# Patient Record
Sex: Female | Born: 1945 | Race: White | Hispanic: No | Marital: Married | Smoking: Former smoker
Health system: Southern US, Community
[De-identification: ages and names within clinical notes are randomized; demographics above are authoritative.]

## PROBLEM LIST (undated history)

## (undated) DIAGNOSIS — Z720 Tobacco use: Secondary | ICD-10-CM

## (undated) DIAGNOSIS — I10 Essential (primary) hypertension: Secondary | ICD-10-CM

## (undated) DIAGNOSIS — I480 Paroxysmal atrial fibrillation: Secondary | ICD-10-CM

## (undated) DIAGNOSIS — I872 Venous insufficiency (chronic) (peripheral): Secondary | ICD-10-CM

## (undated) DIAGNOSIS — I959 Hypotension, unspecified: Secondary | ICD-10-CM

## (undated) DIAGNOSIS — J45909 Unspecified asthma, uncomplicated: Secondary | ICD-10-CM

## (undated) DIAGNOSIS — J449 Chronic obstructive pulmonary disease, unspecified: Secondary | ICD-10-CM

## (undated) HISTORY — DX: Unspecified asthma, uncomplicated: J45.909

## (undated) HISTORY — PX: NECK SURGERY: SHX720

## (undated) HISTORY — DX: Tobacco use: Z72.0

## (undated) HISTORY — DX: Hypotension, unspecified: I95.9

## (undated) HISTORY — PX: VESICOVAGINAL FISTULA CLOSURE W/ TAH: SUR271

## (undated) HISTORY — DX: Paroxysmal atrial fibrillation: I48.0

## (undated) HISTORY — DX: Venous insufficiency (chronic) (peripheral): I87.2

## (undated) HISTORY — PX: OTHER SURGICAL HISTORY: SHX169

## (undated) HISTORY — DX: Chronic obstructive pulmonary disease, unspecified: J44.9

---

## 1998-07-21 ENCOUNTER — Ambulatory Visit (HOSPITAL_COMMUNITY): Admission: RE | Admit: 1998-07-21 | Discharge: 1998-07-21 | Payer: Self-pay | Admitting: Cardiology

## 1998-08-06 ENCOUNTER — Encounter: Payer: Self-pay | Admitting: Cardiology

## 1998-08-06 ENCOUNTER — Ambulatory Visit (HOSPITAL_COMMUNITY): Admission: RE | Admit: 1998-08-06 | Discharge: 1998-08-06 | Payer: Self-pay | Admitting: Cardiology

## 1998-08-21 ENCOUNTER — Ambulatory Visit (HOSPITAL_COMMUNITY): Admission: RE | Admit: 1998-08-21 | Discharge: 1998-08-21 | Payer: Self-pay | Admitting: Cardiology

## 2005-01-06 ENCOUNTER — Ambulatory Visit: Payer: Self-pay | Admitting: Internal Medicine

## 2005-05-04 ENCOUNTER — Ambulatory Visit: Payer: Self-pay | Admitting: Internal Medicine

## 2005-07-15 ENCOUNTER — Emergency Department (HOSPITAL_COMMUNITY): Admission: EM | Admit: 2005-07-15 | Discharge: 2005-07-15 | Payer: Self-pay | Admitting: Emergency Medicine

## 2005-07-23 ENCOUNTER — Ambulatory Visit: Payer: Self-pay | Admitting: Internal Medicine

## 2006-02-23 ENCOUNTER — Ambulatory Visit: Payer: Self-pay | Admitting: Internal Medicine

## 2006-03-23 ENCOUNTER — Ambulatory Visit: Payer: Self-pay | Admitting: Internal Medicine

## 2006-06-13 ENCOUNTER — Ambulatory Visit: Payer: Self-pay | Admitting: Internal Medicine

## 2006-12-28 ENCOUNTER — Ambulatory Visit: Payer: Self-pay | Admitting: Internal Medicine

## 2006-12-28 DIAGNOSIS — J449 Chronic obstructive pulmonary disease, unspecified: Secondary | ICD-10-CM

## 2006-12-28 DIAGNOSIS — Z87891 Personal history of nicotine dependence: Secondary | ICD-10-CM

## 2006-12-28 DIAGNOSIS — I872 Venous insufficiency (chronic) (peripheral): Secondary | ICD-10-CM | POA: Insufficient documentation

## 2006-12-28 DIAGNOSIS — J4489 Other specified chronic obstructive pulmonary disease: Secondary | ICD-10-CM | POA: Insufficient documentation

## 2008-05-14 ENCOUNTER — Ambulatory Visit: Payer: Self-pay | Admitting: Pulmonary Disease

## 2008-05-14 DIAGNOSIS — F411 Generalized anxiety disorder: Secondary | ICD-10-CM

## 2008-05-14 DIAGNOSIS — R030 Elevated blood-pressure reading, without diagnosis of hypertension: Secondary | ICD-10-CM

## 2008-06-18 ENCOUNTER — Ambulatory Visit: Payer: Self-pay | Admitting: Internal Medicine

## 2009-03-05 ENCOUNTER — Ambulatory Visit: Payer: Self-pay | Admitting: Pulmonary Disease

## 2009-03-12 ENCOUNTER — Ambulatory Visit: Payer: Self-pay | Admitting: Pulmonary Disease

## 2009-04-08 ENCOUNTER — Telehealth (INDEPENDENT_AMBULATORY_CARE_PROVIDER_SITE_OTHER): Payer: Self-pay | Admitting: *Deleted

## 2009-05-21 ENCOUNTER — Ambulatory Visit: Payer: Self-pay | Admitting: Pulmonary Disease

## 2009-06-03 ENCOUNTER — Telehealth: Payer: Self-pay | Admitting: Pulmonary Disease

## 2010-02-06 ENCOUNTER — Encounter: Payer: Self-pay | Admitting: Internal Medicine

## 2010-02-10 ENCOUNTER — Ambulatory Visit: Payer: Self-pay | Admitting: Internal Medicine

## 2010-02-10 DIAGNOSIS — K7689 Other specified diseases of liver: Secondary | ICD-10-CM

## 2010-02-10 DIAGNOSIS — I700 Atherosclerosis of aorta: Secondary | ICD-10-CM

## 2010-02-17 LAB — CONVERTED CEMR LAB
ALT: 22 units/L (ref 0–35)
AST: 20 units/L (ref 0–37)
Albumin: 4.1 g/dL (ref 3.5–5.2)
Alkaline Phosphatase: 80 units/L (ref 39–117)
BUN: 11 mg/dL (ref 6–23)
Bilirubin, Direct: 0.1 mg/dL (ref 0.0–0.3)
CO2: 28 meq/L (ref 19–32)
Calcium: 9.6 mg/dL (ref 8.4–10.5)
Chloride: 103 meq/L (ref 96–112)
Cholesterol: 203 mg/dL — ABNORMAL HIGH (ref 0–200)
Creatinine, Ser: 0.7 mg/dL (ref 0.4–1.2)
Direct LDL: 136 mg/dL
GFR calc non Af Amer: 86.44 mL/min (ref >60)
Glucose, Bld: 90 mg/dL (ref 70–99)
HDL: 55.1 mg/dL (ref >39.00)
Potassium: 4.1 meq/L (ref 3.5–5.1)
Sodium: 140 meq/L (ref 135–145)
Total Bilirubin: 0.4 mg/dL (ref 0.3–1.2)
Total CHOL/HDL Ratio: 4
Total Protein: 6.9 g/dL (ref 6.0–8.3)
Triglycerides: 138 mg/dL (ref 0.0–149.0)
VLDL: 27.6 mg/dL (ref 0.0–40.0)

## 2010-06-24 ENCOUNTER — Other Ambulatory Visit: Payer: Self-pay | Admitting: Internal Medicine

## 2010-06-24 ENCOUNTER — Ambulatory Visit
Admission: RE | Admit: 2010-06-24 | Discharge: 2010-06-24 | Payer: Self-pay | Source: Home / Self Care | Attending: Internal Medicine | Admitting: Internal Medicine

## 2010-06-24 ENCOUNTER — Encounter: Payer: Self-pay | Admitting: Internal Medicine

## 2010-06-24 ENCOUNTER — Encounter (INDEPENDENT_AMBULATORY_CARE_PROVIDER_SITE_OTHER): Payer: Self-pay | Admitting: *Deleted

## 2010-06-24 DIAGNOSIS — R143 Flatulence: Secondary | ICD-10-CM

## 2010-06-24 DIAGNOSIS — K5900 Constipation, unspecified: Secondary | ICD-10-CM | POA: Insufficient documentation

## 2010-06-24 DIAGNOSIS — R141 Gas pain: Secondary | ICD-10-CM | POA: Insufficient documentation

## 2010-06-24 DIAGNOSIS — R142 Eructation: Secondary | ICD-10-CM

## 2010-06-24 DIAGNOSIS — R109 Unspecified abdominal pain: Secondary | ICD-10-CM | POA: Insufficient documentation

## 2010-06-24 LAB — CBC WITH DIFFERENTIAL/PLATELET
Basophils Absolute: 0 10*3/uL (ref 0.0–0.1)
Basophils Relative: 0.4 % (ref 0.0–3.0)
Eosinophils Absolute: 0.5 10*3/uL (ref 0.0–0.7)
Eosinophils Relative: 4.7 % (ref 0.0–5.0)
HCT: 43.4 % (ref 36.0–46.0)
Hemoglobin: 14.6 g/dL (ref 12.0–15.0)
Lymphocytes Relative: 28.9 % (ref 12.0–46.0)
Lymphs Abs: 2.9 10*3/uL (ref 0.7–4.0)
MCHC: 33.7 g/dL (ref 30.0–36.0)
MCV: 90.9 fl (ref 78.0–100.0)
Monocytes Absolute: 0.7 10*3/uL (ref 0.1–1.0)
Monocytes Relative: 7.3 % (ref 3.0–12.0)
Neutro Abs: 6 10*3/uL (ref 1.4–7.7)
Neutrophils Relative %: 58.7 % (ref 43.0–77.0)
Platelets: 252 10*3/uL (ref 150.0–400.0)
RBC: 4.78 Mil/uL (ref 3.87–5.11)
RDW: 14.3 % (ref 11.5–14.6)
WBC: 10.2 10*3/uL (ref 4.5–10.5)

## 2010-06-24 LAB — HEPATIC FUNCTION PANEL
ALT: 16 U/L (ref 0–35)
AST: 20 U/L (ref 0–37)
Albumin: 3.9 g/dL (ref 3.5–5.2)
Alkaline Phosphatase: 90 U/L (ref 39–117)
Bilirubin, Direct: 0.1 mg/dL (ref 0.0–0.3)
Total Bilirubin: 0.8 mg/dL (ref 0.3–1.2)
Total Protein: 6.9 g/dL (ref 6.0–8.3)

## 2010-06-24 LAB — TSH: TSH: 2.39 u[IU]/mL (ref 0.35–5.50)

## 2010-06-29 ENCOUNTER — Ambulatory Visit
Admission: RE | Admit: 2010-06-29 | Discharge: 2010-06-29 | Payer: Self-pay | Source: Home / Self Care | Attending: Internal Medicine | Admitting: Internal Medicine

## 2010-06-29 LAB — CONVERTED CEMR LAB
OCCULT 1: NEGATIVE
OCCULT 2: NEGATIVE
OCCULT 3: NEGATIVE

## 2010-06-30 ENCOUNTER — Encounter (INDEPENDENT_AMBULATORY_CARE_PROVIDER_SITE_OTHER): Payer: Self-pay | Admitting: *Deleted

## 2010-07-14 NOTE — Assessment & Plan Note (Signed)
Summary: FOR RIGHT SIDE PAIN//PH   Vital Signs:  Patient profile:   65 year old female Weight:      135.6 pounds BMI:     21.31 Temp:     98.2 degrees F oral Pulse rate:   72 / minute BP sitting:   158 / 90  (left arm) Cuff size:   regular  Vitals Entered By: Shonna Chock CMA (February 10, 2010 9:23 AM) CC: Patient here following up per Dr.Peterson, patient states she was told she has an enlarged Aorta and should follow-up with her primary Dr   Primary Care Provider:  Marga Melnick MD  CC:  Patient here following up per Dr.Peterson and patient states she was told she has an enlarged Aorta and should follow-up with her primary Dr.  History of Present Illness: Dr Vonita Moss found small renal calculus during  abdominal pain evaluation. An incidental finding was an "enlarged aorta". Records  reviewed : fatty infiltration of liver;3 mm LU renal calculus;tiny ? cyst R kidney & aortic ectasia below renal arteries with atherosclerosis. See BP; no PMH of HTN "except when nervous". The patient denies lightheadedness, urinary frequency, headaches, rash, and fatigue.  Associated symptoms include exercise intolerance and dyspnea.  The patient denies the following associated symptoms: chest pain, chest pressure, palpitations, syncope, leg edema, and pedal edema.  The patient reports that dietary compliance has been fair.  The patient reports no exercise.  Adjunctive measures currently used by the patient include salt restriction.   No PMH of dyslidemia. Smoking "2 cigarettes / day".  Current Medications (verified): 1)  Symbicort 80-4.5 Mcg/act Aero (Budesonide-Formoterol Fumarate) .... Inhale 2 Puffs Two Times A Day, As Needed  Allergies (verified): No Known Drug Allergies  Physical Exam  General:  Weathered facies,in no acute distress; alert,appropriate and cooperative throughout examination Lungs:  Normal respiratory effort, chest expands symmetrically. Lungs are : silent  Heart:  Normal rate and  regular rhythm. S1 and S2 normal without gallop, murmur, click, rub.S4 Abdomen:  Bowel sounds positive,abdomen soft and non-tender without masses, organomegaly or hernias noted. No bruits or AAA Pulses:  R and L carotid,radial,dorsalis pedis and posterior tibial pulses are full and equal bilaterally Extremities:  No  cyanosis, edema, or deformity noted .Slight clubbing Psych:  flat affect and subdued.     Impression & Recommendations:  Problem # 1:  ELEVATED BLOOD PRESSURE WITHOUT DIAGNOSIS OF HYPERTENSION (ICD-796.2)  Orders: Venipuncture (95621) TLB-Lipid Panel (80061-LIPID) TLB-BMP (Basic Metabolic Panel-BMET) (80048-METABOL) Specimen Handling (30865)  Her updated medication list for this problem includes:    Diltiazem Hcl Cr 90 Mg Xr12h-cap (Diltiazem hcl) .Marland Kitchen... 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 Inhale 2 puffs two times a day, as needed 2)  Diltiazem Hcl Cr 90 Mg Xr12h-cap (Diltiazem hcl) .Marland Kitchen.. 1 once daily if bp avrages > 135/85  Other Orders: Flu Vaccine 45yrs + (27253) Administration Flu vaccine - MCR (G6440)  Patient Instructions: 1)  Check your Blood Pressure regularly. If it is above: 135/85 ON AVERAGE  you should  start medication as Rxed. Flu Vaccine Consent Questions     Do you have a history of severe allergic reactions to this vaccine? no  Any prior history of allergic reactions to egg and/or gelatin? no    Do you have a sensitivity to the preservative Thimersol? no    Do you have a past history of Guillan-Barre Syndrome? no    Do you currently have an  acute febrile illness? no    Have you ever had a severe reaction to latex? no    Vaccine information given and explained to patient? yes    Are you currently pregnant? no    Lot Number:[id number]   Exp Date:12/12/2010   Site Given  Right Deltoid IMPrescriptions: DILTIAZEM HCL CR 90 MG XR12H-CAP (DILTIAZEM HCL) 1 once daily if BP AVRAGES > 135/85  #30 x 5   Entered and Authorized by:   Marga Melnick MD   Signed by:   Marga Melnick MD on 02/10/2010   Method used:   Print then Give to Patient   RxID:   [id number]  .lbmedflu

## 2010-07-16 NOTE — Miscellaneous (Signed)
Summary: Orders Update  Clinical Lists Changes  Orders: Added new Service order of Prescription Created Electronically (G8553) - Signed 

## 2010-07-16 NOTE — Assessment & Plan Note (Signed)
Summary: RIGHT SIDE PAIN//PH   Vital Signs:  Patient profile:   65 year old female Height:      67 inches (170.18 cm) Weight:      141.38 pounds (64.26 kg) BMI:     22.22 Temp:     98.7 degrees F (37.06 degrees C) oral Resp:     16 per minute BP sitting:   140 / 62  (left arm) Cuff size:   regular  Vitals Entered By: Lucious Groves CMA (June 24, 2010 12:47 PM) CC: C/O right side pain x 3 days./kb, Abdominal pain Is Patient Diabetic? No Pain Assessment Patient in pain? yes     Location: right side Intensity: 2 Type: gas Onset of pain  3 days Comments Patient notes that she has been having constipation, increased gas and bloating. She denies fever, and N&V.   Primary Care Provider:  Marga Melnick MD  CC:  C/O right side pain x 3 days./kb and Abdominal pain.  History of Present Illness: Abdominal Pain      This is a 65 year old woman who presents with Abdominal pain & bloating  X 3 days.  The patient reports constipation (an intermittent issue. Note: on CCB), but denies nausea, vomiting, diarrhea, melena, hematochezia, anorexia, and hematemesis.  The location of the pain is right lower, lateral  quadrant in MAL above the R hip.  The pain is described as intermittent, sharp/ "gas" like , and radiating to the groin on 1 occasion only.  The patient denies the following symptoms: fever, weight loss, dysuria, chest pain, jaundice, dark urine, and vaginal bleeding or discharge.  The pain is worse with spicey  food.  The pain  has improved  with  Tylenol.  BM this am was scant.She has had similar , but < severe pain over past year when constipated. No colonoscopy; "  I don't run to the doctor if I can figure it out.I'm afraid of of it".SOC for colonoscopy reviewed. No PMH  or FH of GI diseases. PMH of TAH & BSO  , "except they left a 1/4 of 1 ovary". Gyn last seen 6 months.  Current Medications (verified): 1)  Symbicort 80-4.5 Mcg/act Aero (Budesonide-Formoterol Fumarate) .... Inhale 2  Puffs Two Times A Day, As Needed 2)  Diltiazem Hcl Cr 90 Mg Xr12h-Cap (Diltiazem Hcl) .Marland Kitchen.. 1 Once Daily If Bp Avrages > 135/85  Allergies (verified): No Known Drug Allergies  Physical Exam  General:  in no acute distress; alert,appropriate and cooperative throughout examination Eyes:  No corneal or conjunctival inflammation noted.No icterus Mouth:  Oral mucosa and oropharynx without lesions or exudates.  No pharyngeal erythema.   Neck:  No deformities, masses, or tenderness noted. Lungs:  Normal respiratory effort, chest expands symmetrically. Lungs are clear to auscultation, no crackles or wheezes. Decreased BS Heart:  regular rhythm, no murmur, no gallop, no rub, no JVD, no HJR, and bradycardia.   Abdomen:  Bowel sounds positive,abdomen soft and non-tender without masses, organomegaly or hernias noted. Dullness RUQ Skin:  Intact without suspicious lesions or rashes. No jaundice Cervical Nodes:  No lymphadenopathy noted Axillary Nodes:  No palpable lymphadenopathy Psych:  memory intact for recent and remote, normally interactive, and good eye contact.     Impression & Recommendations:  Problem # 1:  ABDOMINAL PAIN (ICD-789.00)  Orders: Gastroenterology Referral (GI)  Problem # 2:  ABDOMINAL BLOATING (ICD-787.3)  ? 1/4 of 1 ovary remains  Orders: Venipuncture (30865) TLB-TSH (Thyroid Stimulating Hormone) (84443-TSH) TLB-CBC Platelet - w/Differential (  85025-CBCD) TLB-Hepatic/Liver Function Pnl (80076-HEPATIC)  Problem # 3:  CONSTIPATION (ICD-564.00)  intermittent  Orders: Venipuncture (11914) TLB-TSH (Thyroid Stimulating Hormone) (78295-AOZ) Gastroenterology Referral (GI)  Complete Medication List: 1)  Symbicort 80-4.5 Mcg/act Aero (Budesonide-formoterol fumarate) .... Inhale 2 puffs two times a day, as needed 2)  Diltiazem Hcl Cr 90 Mg Xr12h-cap (Diltiazem hcl) .Marland Kitchen.. 1 once daily if bp avrages > 135/85 3)  Oscimin 0.125 Mg Subl (Hyoscyamine sulfate) .Marland Kitchen.. 1 under  tongue every 6 hrs as needed for pain  Patient Instructions: 1)  Complete stool cards. Verify with Gyn as to status of ovarian surgery. Miralax every 3rd night as needed for costipation.  If constipation persists , Diltiazem may need to be changed. Prescriptions: OSCIMIN 0.125 MG SUBL (HYOSCYAMINE SULFATE) 1 under tongue every 6 hrs as needed for pain  #30 x 0   Entered and Authorized by:   Marga Melnick MD   Signed by:   Marga Melnick MD on 06/24/2010   Method used:   [id number]       [address]       [place], [place]  [address]       Ph: [phone]       Fax: [phone]   RxID:   [id number]    Orders Added: 1)  Est. Patient Level IV [87564] 2)  Venipuncture [33295] 3)  TLB-TSH (Thyroid Stimulating Hormone) [84443-TSH] 4)  TLB-CBC Platelet - w/Differential [85025-CBCD] 5)  TLB-Hepatic/Liver Function Pnl [80076-HEPATIC] 6)  Gastroenterology Referral [GI]  Appended Document: RIGHT SIDE PAIN//PH

## 2010-07-16 NOTE — Letter (Signed)
Summary: Results Follow up Letter  [place] [facility]  [address] [place], [place] [address]   Phone: [phone]  Fax: [phone]    06/30/2010 MRN: [id number]  MARGIE Rachal [address], [place]  [address]  Dear Ms. Janowiak,  The following are the results of your recent test(s):  Test         Result    Pap Smear:        Normal _____  Not Normal _____ Comments: ______________________________________________________ Cholesterol: LDL(Bad cholesterol):         Your goal is less than:         HDL (Good cholesterol):       Your goal is more than: Comments:  ______________________________________________________ Mammogram:        Normal _____  Not Normal _____ Comments:  ___________________________________________________________________ Hemoccult:        Normal _X____  Not normal _______ Comments:    _____________________________________________________________________ Other Tests:    We routinely do not discuss normal results over the telephone.  If you desire a copy of the results, or you have any questions about this information we can discuss them at your next office visit.   Sincerely,

## 2010-07-16 NOTE — Letter (Signed)
Summary: New Patient letter  [facility]  [address] [place], [place] [address]   Phone: [phone]  Fax: [phone]       06/24/2010 MRN: [id number]  [facility] [place] [address], [place]  [address]  Dear Ms. Abbey,  Welcome to the Gastroenterology Division at [facility].    You are scheduled to see Dr.  Stan Head on July 31, 2010 at 2:00pm on the 3rd floor at [facility], [address]  We ask that you try to arrive at our office 15 minutes prior to your appointment time to allow for check-in.  We would like you to complete the enclosed self-administered evaluation form prior to your visit and bring it with you on the day of your appointment.  We will review it with you.  Also, please bring a complete list of all your medications or, if you prefer, bring the medication bottles and we will list them.  Please bring your insurance card so that we may make a copy of it.  If your insurance requires a referral to see a specialist, please bring your referral form from your primary care physician.  Co-payments are due at the time of your visit and may be paid by cash, check or credit card.     Your office visit will consist of a consult with your physician (includes a physical exam), any laboratory testing he/she may order, scheduling of any necessary diagnostic testing (e.g. x-ray, ultrasound, CT-scan), and scheduling of a procedure (e.g. Endoscopy, Colonoscopy) if required.  Please allow enough time on your schedule to allow for any/all of these possibilities.    If you cannot keep your appointment, please call [phone] to cancel or reschedule prior to your appointment date.  This allows [place] the opportunity to schedule an appointment for another patient in need of care.  If you do not cancel or reschedule by 5 p.m. the business day prior to your appointment date, you will be charged a $50.00 late cancellation/no-show fee.    Thank you for  choosing [facility] for your medical needs.  We appreciate the opportunity to care for you.  Please visit [place] at our website  to learn more about our practice.                     Sincerely,                                                             The Gastroenterology Division

## 2010-07-31 ENCOUNTER — Ambulatory Visit: Payer: Self-pay | Admitting: Internal Medicine

## 2010-08-10 ENCOUNTER — Telehealth: Payer: Self-pay | Admitting: Pulmonary Disease

## 2010-08-20 NOTE — Progress Notes (Signed)
Summary: refill request  Phone Note Call from Patient Call back at Home Phone [phone]   Caller: Patient Call For: alva Summary of Call: pt has made an appt for 08/27/10. she requests to have symbicort refilled in the meantime. [facility] Initial call taken by: Tivis Ringer, CNA,  August 10, 2010 11:35 AM  Follow-up for Phone Call        refill sent to [facility]. Pt aware.Carron Curie CMA  August 10, 2010 2:58 PM     Prescriptions: SYMBICORT 80-4.5 MCG/ACT AERO (BUDESONIDE-FORMOTEROL FUMARATE) Inhale 2 puffs two times a day, as needed  #1 x 0   Entered by:   Carron Curie CMA   Authorized by:   Comer Locket. Vassie Loll MD   Signed by:   Carron Curie CMA on 08/10/2010   Method used:   Electronically to        [phone][facility] (retail)       [address]       [address]       [place], [place]  [address]       Ph: [phone]       Fax: [phone]   RxID:   [id number] SYMBICORT 80-4.5 MCG/ACT AERO (BUDESONIDE-FORMOTEROL FUMARATE) Inhale 2 puffs two times a day, as needed  #1 x 0   Entered by:   Carron Curie CMA   Authorized by:   Comer Locket. Vassie Loll MD   Signed by:   Carron Curie CMA on 08/10/2010   Method used:   [facility]        [facility] [address] [id number]       [address]       [place], [place]  [address]       Ph: [phone]       Fax: [phone]   RxID:   [id number]

## 2010-08-27 ENCOUNTER — Ambulatory Visit: Payer: Medicare Other | Admitting: Pulmonary Disease

## 2010-09-08 ENCOUNTER — Encounter: Payer: Self-pay | Admitting: Pulmonary Disease

## 2010-09-11 ENCOUNTER — Encounter: Payer: Self-pay | Admitting: Pulmonary Disease

## 2010-09-11 ENCOUNTER — Ambulatory Visit (INDEPENDENT_AMBULATORY_CARE_PROVIDER_SITE_OTHER)
Admission: RE | Admit: 2010-09-11 | Discharge: 2010-09-11 | Disposition: A | Discharge status: Home / Self Care | Payer: Medicare Other | Source: Ambulatory Visit | Attending: Pulmonary Disease | Admitting: Pulmonary Disease

## 2010-09-11 ENCOUNTER — Ambulatory Visit (INDEPENDENT_AMBULATORY_CARE_PROVIDER_SITE_OTHER): Payer: Medicare Other | Admitting: Pulmonary Disease

## 2010-09-11 VITALS — BP 142/90 | HR 73 | Temp 97.9°F | Ht 68.0 in | Wt 143.6 lb

## 2010-09-11 DIAGNOSIS — F17200 Nicotine dependence, unspecified, uncomplicated: Secondary | ICD-10-CM

## 2010-09-11 DIAGNOSIS — J4489 Other specified chronic obstructive pulmonary disease: Secondary | ICD-10-CM

## 2010-09-11 DIAGNOSIS — J449 Chronic obstructive pulmonary disease, unspecified: Secondary | ICD-10-CM

## 2010-09-11 DIAGNOSIS — Z23 Encounter for immunization: Secondary | ICD-10-CM

## 2010-09-11 MED ORDER — BUDESONIDE-FORMOTEROL FUMARATE 80-4.5 MCG/ACT IN AERO: 2.0000 | INHALATION_SPRAY | Freq: Two times a day (BID) | RESPIRATORY_TRACT | Status: DC

## 2010-09-11 NOTE — Patient Instructions (Addendum)
Pneumonia vaccine today We will put in a referral for pulmonary rehab - exercise program Keep trying to quit smoking- It is time to make another attempt - Start by setting a quit date Chest xray today

## 2010-09-11 NOTE — Progress Notes (Signed)
  Subjective:    Patient ID: Diane Patterson, female    DOB: 08-14-1945, 65 y.o.   MRN: [id number]  HPI 62/F, 2 pack per day smoker for FU of Gold stg IV COPD  FEV1 was 27% in 12/09. CXR 12/09 hyperinflation   03/05/09  Continues to smoke  She describes an early morning cough, and occasional wheezing.  she does not use any bronchodilator inhalers. She does not desire any medications to quit smoking but feels that she can do this on her own if she applies her mind to it.   May 21, 2009 9:06 AM  quit x 3 wks, woke up hurting all over & went back to smoking, quit x 9 days, wants a nerve pill, does not want to get' hooked', afraid of heart attacks with patches  Pt c/o increased S.O.B. Pt states has been using ProAir twice daily instead of Symbicort. >> given Rx for pred, nicotine & welbutrin  09/11/2010 - 15 mnth FU 3 cigs/day, if she has a flare -1 puff  symbicort helps Gained 8 lbs over last yr, not needed prednisone all of last yr Turned 64 last yr, not bothered by pollen as much      Review of Systems Pt denies any significant  nasal congestion or excess secretions, fever, chills, sweats, unintended wt loss, pleuritic or exertional cp, orthopnea pnd or leg swelling.  Pt also denies any obvious fluctuation in symptoms with weather or environmental change or other alleviating or aggravating factors.    Pt denies any increase in rescue therapy over baseline, denies waking up needing it or having early am exacerbations or coughing/wheezing/ or dyspnea       Objective:   Physical Exam Gen. Pleasant, well-nourished, in no distress ENT - no lesions, no post nasal drip Neck: No JVD, no thyromegaly, no carotid bruits Lungs: no use of accessory muscles, no dullness to percussion, clear without rales or rhonchi  Cardiovascular: Rhythm regular, heart sounds  normal, no murmurs or gallops, no peripheral edema Musculoskeletal: No deformities, no cyanosis or clubbing           Assessment & Plan:

## 2010-09-14 NOTE — Assessment & Plan Note (Signed)
Ct symbicort 160 bid Pulm rehab referral

## 2010-09-14 NOTE — Assessment & Plan Note (Signed)
Best to focus our efforts here Encouraged her to set another quit date Perhaps pulm rehab will help

## 2010-09-15 ENCOUNTER — Telehealth: Payer: Self-pay | Admitting: *Deleted

## 2010-09-15 NOTE — Telephone Encounter (Signed)
Pt returned call from jes r

## 2010-09-15 NOTE — Telephone Encounter (Signed)
Message copied by Zackery Barefoot on Tue Sep 15, 2010 11:36 AM ------      Message from: Cyril Mourning      Created: Mon Sep 14, 2010 10:58 AM       Stable changes of COPD

## 2010-09-17 NOTE — Telephone Encounter (Signed)
lmomtcb x 2  

## 2010-10-08 ENCOUNTER — Other Ambulatory Visit: Payer: Self-pay | Admitting: Pulmonary Disease

## 2010-10-20 NOTE — Telephone Encounter (Signed)
lmomtcb x 3 to inform pt cxr is stable per RA.

## 2010-10-29 ENCOUNTER — Telehealth: Payer: Self-pay | Admitting: Pulmonary Disease

## 2010-10-29 NOTE — Telephone Encounter (Signed)
Spoke with pt. She is requesting cxr results from 09/11/10- I advised RA out of the office until 11/02/10 and I will forward him msg so he can advise results. Pt verbalized understanding. Pls advise, thanks

## 2010-11-01 NOTE — Telephone Encounter (Signed)
Pl see xray results & last phone note.

## 2010-11-02 NOTE — Telephone Encounter (Signed)
See new phone note from 10-29-10. I will sign off on this one. Carron Curie, CMA

## 2010-11-02 NOTE — Telephone Encounter (Signed)
LMTCBx1.Diane Patterson, CMA  

## 2010-11-02 NOTE — Telephone Encounter (Signed)
Pt aware CXR stable copd changes.Carron Curie, CMA

## 2010-11-18 ENCOUNTER — Other Ambulatory Visit: Payer: Self-pay | Admitting: Pulmonary Disease

## 2010-11-19 ENCOUNTER — Telehealth: Payer: Self-pay | Admitting: Pulmonary Disease

## 2010-11-19 NOTE — Telephone Encounter (Signed)
Left message on machine that Rx was sent by Zackery Barefoot today-if any questions or concerns please contact the office.

## 2010-11-19 NOTE — Telephone Encounter (Signed)
Duplicate message. 

## 2011-01-05 ENCOUNTER — Other Ambulatory Visit: Payer: Self-pay | Admitting: Pulmonary Disease

## 2011-01-08 ENCOUNTER — Telehealth: Payer: Self-pay | Admitting: Pulmonary Disease

## 2011-01-08 NOTE — Telephone Encounter (Signed)
Refill request in [place] R's box.  Last refill 6.5.12 with no refills.  Pt last seen by RA 08/2010 and told to follow up with TP in 4 months.  No pending appts at this time.  Need to schedule appt with TP to send refills.  LMOM TCB x1.

## 2011-01-08 NOTE — Telephone Encounter (Signed)
Rx has already been sent.    Called, spoke with pt.  She is aware rx was sent but to keep f/u with TP on 01/19/11.  She verbalized understanding of this.

## 2011-01-08 NOTE — Telephone Encounter (Signed)
PATIENT SCHEDULED APPOINTMENT ON 01/19/11 WITH TAMMY PARRETT.  SHE WANTS TO KNOW IF SHE CAN HAVE REFILL NOW

## 2011-01-11 ENCOUNTER — Ambulatory Visit: Payer: Medicare Other | Admitting: Adult Health

## 2011-01-13 ENCOUNTER — Ambulatory Visit: Payer: Medicare Other | Admitting: Adult Health

## 2011-01-19 ENCOUNTER — Encounter: Payer: Self-pay | Admitting: Adult Health

## 2011-01-19 ENCOUNTER — Ambulatory Visit (INDEPENDENT_AMBULATORY_CARE_PROVIDER_SITE_OTHER): Payer: Medicare Other | Admitting: Adult Health

## 2011-01-19 DIAGNOSIS — J449 Chronic obstructive pulmonary disease, unspecified: Secondary | ICD-10-CM

## 2011-01-19 MED ORDER — BUDESONIDE-FORMOTEROL FUMARATE 80-4.5 MCG/ACT IN AERO: INHALATION_SPRAY | RESPIRATORY_TRACT | Status: DC

## 2011-01-19 NOTE — Patient Instructions (Signed)
Call for flu shot next month.  Keep trying to quit smoking- It is time to make another attempt - Start by setting a quit date follow up Dr. Vassie Loll  In 6 months and As needed

## 2011-01-20 DIAGNOSIS — J449 Chronic obstructive pulmonary disease, unspecified: Secondary | ICD-10-CM | POA: Insufficient documentation

## 2011-01-20 NOTE — Progress Notes (Signed)
  Subjective:    Patient ID: Diane Patterson, female    DOB: 07-25-1945, 65 y.o.   MRN: [id number]  HPI  62/F, 2 pack per day smoker for FU of Gold stg IV COPD  FEV1 was 27% in 12/09. CXR 12/09 hyperinflation   03/05/09  Continues to smoke  She describes an early morning cough, and occasional wheezing.  she does not use any bronchodilator inhalers. She does not desire any medications to quit smoking but feels that she can do this on her own if she applies her mind to it.   May 21, 2009 9:06 AM  quit x 3 wks, woke up hurting all over & went back to smoking, quit x 9 days, wants a nerve pill, does not want to get' hooked', afraid of heart attacks with patches  Pt c/o increased S.O.B. Pt states has been using ProAir twice daily instead of Symbicort. >> given Rx for pred, nicotine & welbutrin  09/11/2010 - 15 mnth FU 3 cigs/day, if she has a flare -1 puff  symbicort helps Gained 8 lbs over last yr, not needed prednisone all of last yr Turned 64 last yr, not bothered by pollen as much >>no changes, cxr with chronic changes   01/19/11 Follow up  Presents for follow up . She says she is doing well with no flare in symptoms.  She has cut down to 2 cigs /daily. Takes Symbicort everyday.  No increased cough or congestion. No weight loss or hemopytsis .       Review of Systems  Pt denies any significant  nasal congestion or excess secretions, fever, chills, sweats, unintended wt loss, pleuritic or exertional cp, orthopnea pnd or leg swelling.  Pt also denies any obvious fluctuation in symptoms with weather or environmental change or other alleviating or aggravating factors.    Pt denies any increase in rescue therapy over baseline, denies waking up needing it or having early am exacerbations or coughing/wheezing/ or dyspnea       Objective:   Physical Exam  Gen. Pleasant, well-nourished, in no distress ENT - no lesions, no post nasal drip Neck: No JVD, no thyromegaly, no carotid  bruits Lungs: no use of accessory muscles, no dullness to percussion, clear without rales or rhonchi  Cardiovascular: Rhythm regular, heart sounds  normal, no murmurs or gallops, no peripheral edema Musculoskeletal: No deformities, no cyanosis or clubbing          Assessment & Plan:

## 2011-01-20 NOTE — Assessment & Plan Note (Signed)
Compensated on present regimen Encouraged on smoking cesstation  cxr this year with chronic changes.   Plan Call for flu shot next month.  Keep trying to quit smoking- It is time to make another attempt - Start by setting a quit date follow up Dr. Vassie Loll  In 6 months and As needed

## 2011-01-31 ENCOUNTER — Other Ambulatory Visit: Payer: Self-pay | Admitting: Internal Medicine

## 2011-05-12 ENCOUNTER — Other Ambulatory Visit: Payer: Self-pay | Admitting: Internal Medicine

## 2011-05-17 ENCOUNTER — Other Ambulatory Visit: Payer: Self-pay | Admitting: Internal Medicine

## 2011-05-17 NOTE — Telephone Encounter (Signed)
Patient needs to schedule a CPX  

## 2011-06-04 ENCOUNTER — Telehealth: Payer: Self-pay | Admitting: Pulmonary Disease

## 2011-06-04 MED ORDER — OSELTAMIVIR PHOSPHATE 75 MG PO CAPS: 75.0000 mg | ORAL_CAPSULE | Freq: Two times a day (BID) | ORAL | Status: AC

## 2011-06-04 NOTE — Telephone Encounter (Signed)
Pt called back again re: same. Kathleen W Perdue  °

## 2011-06-04 NOTE — Telephone Encounter (Signed)
Pt aware recs from RA and rx sent.

## 2011-06-04 NOTE — Telephone Encounter (Signed)
tamiflu x 5days Delsym for cough Plenty of oral fluids Tylenol for fever ER/ urgent care if worse

## 2011-06-04 NOTE — Telephone Encounter (Signed)
Pt says she has been around family that were diagnosed with the flu and yesterday she started having body aches, chills, fever of 100, severe cough and N & V. She is requesting something be called to her pharmacy. Pls advise.

## 2011-06-22 ENCOUNTER — Emergency Department (INDEPENDENT_AMBULATORY_CARE_PROVIDER_SITE_OTHER): Payer: Medicare Other

## 2011-06-22 ENCOUNTER — Emergency Department (HOSPITAL_COMMUNITY): Payer: Medicare Other

## 2011-06-22 ENCOUNTER — Encounter (HOSPITAL_COMMUNITY): Payer: Self-pay | Admitting: *Deleted

## 2011-06-22 ENCOUNTER — Telehealth: Payer: Self-pay | Admitting: Pulmonary Disease

## 2011-06-22 ENCOUNTER — Emergency Department (INDEPENDENT_AMBULATORY_CARE_PROVIDER_SITE_OTHER)
Admission: EM | Admit: 2011-06-22 | Discharge: 2011-06-22 | Disposition: A | Discharge status: Home / Self Care | Payer: Medicare Other | Source: Home / Self Care | Attending: Family Medicine | Admitting: Family Medicine

## 2011-06-22 DIAGNOSIS — J441 Chronic obstructive pulmonary disease with (acute) exacerbation: Secondary | ICD-10-CM

## 2011-06-22 MED ORDER — IPRATROPIUM-ALBUTEROL 18-103 MCG/ACT IN AERO: 2.0000 | INHALATION_SPRAY | Freq: Four times a day (QID) | RESPIRATORY_TRACT | Status: DC | PRN

## 2011-06-22 MED ORDER — DOXYCYCLINE HYCLATE 100 MG PO CAPS: 100.0000 mg | ORAL_CAPSULE | Freq: Two times a day (BID) | ORAL | Status: AC

## 2011-06-22 MED ORDER — PREDNISONE 20 MG PO TABS: ORAL_TABLET | ORAL | Status: AC

## 2011-06-22 MED ORDER — HYDROCODONE-ACETAMINOPHEN 7.5-325 MG/15ML PO SOLN: 15.0000 mL | Freq: Three times a day (TID) | ORAL | Status: AC | PRN

## 2011-06-22 NOTE — ED Notes (Signed)
Pt reports she was Dx'd with the flu 06/04/2011 and took tamiflu and Delsym.  She is having a productive cough and chills.

## 2011-06-22 NOTE — Telephone Encounter (Signed)
Pl arrange soonest OV with TP or any available MD- - if none, ok to overbook me when I am back

## 2011-06-22 NOTE — Telephone Encounter (Signed)
Per SN if no openings then pt needs to go to ER. I advised pt of this and she voiced her understanding and states she will go to [facility] er. Will forward to Dr. Vassie Loll as an [place] Childes

## 2011-06-22 NOTE — ED Provider Notes (Signed)
History     CSN: [id number]  Arrival date & time 06/22/11  1601   First MD Initiated Contact with Patient 06/22/11 1722      Chief Complaint  Patient presents with  . Cough    (Consider location/radiation/quality/duration/timing/severity/associated sxs/prior treatment) HPI Comments: 66 y/o female former smoker Here c/o sob and productive cough for 1 week. She was seen by her PCP on Dec 21 diagnosed with influenza and completed a cycle of Oseltamivir. Was getting better but cough persisted and have been feeling sick again with body aches from coughing, wheezing and cough during last week. No fever but chills. Decreased appetite but drinking fluids well.  No headache or dizziness, no chest pain. She is using symbicort bid but does not have a rescue inhaler.    Past Medical History  Diagnosis Date  . Tobacco abuse   . Venous insufficiency   . Chronic airflow obstruction     Past Surgical History  Procedure Date  . Vesicovaginal fistula closure w/ tah   . Neck surgery     Family History  Problem Relation Age of Onset  . Emphysema Father   . Asthma Father   . Heart disease Father   . Diabetes Mother     History  Substance Use Topics  . Smoking status: Current Everyday Smoker -- 1.0 packs/day for 15 years    Types: Cigarettes  . Smokeless tobacco: Not on file  . Alcohol Use: No    OB History    Grav Para Term Preterm Abortions TAB SAB Ect Mult Living                  Review of Systems  Constitutional: Positive for chills and fatigue. Negative for fever.  HENT: Negative for ear pain, congestion, sore throat, rhinorrhea, trouble swallowing, neck pain and neck stiffness.   Respiratory: Positive for cough, chest tightness, shortness of breath and wheezing.   Cardiovascular: Negative for chest pain, palpitations and leg swelling.  Gastrointestinal: Negative for nausea, vomiting, abdominal pain and diarrhea.  Neurological: Negative for dizziness and headaches.     Allergies  Review of patient's allergies indicates no known allergies.  Home Medications   Current Outpatient Rx  Name Route Sig Dispense Refill  . BUDESONIDE-FORMOTEROL FUMARATE 80-4.5 MCG/ACT IN AERO  INHALE 2 PUFFS BY MOUTH TWICE DAILY AS NEEDED 10.2 g 3  . DILTIAZEM HCL ER 90 MG PO CP12  TAKE ONE CAPSULE EVERY DAY IF BLOOD PRESSURE AVRAGES>135/85 30 capsule 1    **APPOINTMENT DUE 06/2011**  . IPRATROPIUM-ALBUTEROL 18-103 MCG/ACT IN AERO Inhalation Inhale 2 puffs into the lungs every 6 (six) hours as needed for wheezing or shortness of breath. 1 Inhaler 0  . DILTIAZEM HCL ER 90 MG PO CP12  TAKE ONE CAPSULE EVERY DAY IF BLOOD PRESSURE AVRAGES>135/85 30 capsule 1    **APPOINTMENT DUE**  . DOXYCYCLINE HYCLATE 100 MG PO CAPS Oral Take 1 capsule (100 mg total) by mouth 2 (two) times daily. 14 capsule 0    Take for 7 days  . HYDROCODONE-ACETAMINOPHEN 7.5-325 MG/15ML PO SOLN Oral Take 15 mLs by mouth every 8 (eight) hours as needed for pain (or for cough). 120 mL 0  . HYOSCYAMINE SULFATE 0.125 MG PO TABS Oral Take 0.125 mg by mouth every 6 (six) hours as needed.      Marland Kitchen PREDNISONE 20 MG PO TABS  2 tabs po daily for 5 days 10 tablet no    BP 165/64  Pulse 81  Temp(Src) 98.4  F (36.9 C) (Oral)  Resp 16  SpO2 98%  Physical Exam  Nursing note and vitals reviewed. Constitutional: She is oriented to person, place, and time. She appears well-developed and well-nourished. No distress.  HENT:  Head: Normocephalic and atraumatic.  Right Ear: External ear normal.  Left Ear: External ear normal.  Nose: Nose normal.  Mouth/Throat: Oropharynx is clear and moist. No oropharyngeal exudate.  Eyes: Conjunctivae and EOM are normal. Pupils are equal, round, and reactive to light. Right eye exhibits no discharge. Left eye exhibits no discharge.  Neck: Neck supple. No JVD present.  Cardiovascular: Normal rate, regular rhythm and normal heart sounds.   Pulmonary/Chest:       Mild wheezing and  rhonchi bilaterally, no tachypnea or orthopnea, no retractions, no crackles.  Lymphadenopathy:    She has no cervical adenopathy.  Neurological: She is alert and oriented to person, place, and time.  Skin: No rash noted.    ED Course  Procedures (including critical care time)  Labs Reviewed - No data to display Dg Chest 2 View  06/22/2011  *RADIOLOGY REPORT*  Clinical Data: Cough, shortness of breath  CHEST - 2 VIEW  Comparison: 09/11/2010  Findings: Hyperinflation noted with upper lobe scarring compatible with COPD/emphysema.  Stable heart size and vascularity.  Negative for pneumonia, collapse, consolidation, effusion or pneumothorax. Trachea midline.  Mild thoracic spondylosis.  IMPRESSION: Stable COPD/emphysema.  No active chest process.  Original Report Authenticated By: Judie Petit. Ruel Favors, M.D.     1. COPD exacerbation       MDM  Started on prednisone, rescue inhaler (combivent), doxycycline and hydrocodone.  Reccomended follow up with lung specialist. Asked to return if worsening symptoms despite treatment.        Sharin Grave, MD 06/23/11 1338

## 2011-06-22 NOTE — Telephone Encounter (Signed)
Pt c/o chest congestion, occ productive cough with thick light yellow mucus hard to bring up x 1 week, tired, chest tightness with cough, wheezing, increased SOB with exertion, low grade fever, body aches. Pt was treated with Tamiflu 06/04/11 x 5 days and feels she did get over the flu. Denies: n/v/d. Please advise, thank you.    No Known Allergies   [facility].

## 2011-07-01 ENCOUNTER — Telehealth: Payer: Self-pay | Admitting: Pulmonary Disease

## 2011-07-01 NOTE — Telephone Encounter (Signed)
Pt requested an appt for Monday due to inclimate weather today & tomorrow.  Pt is set w/ TP 1/21 @ 10:15.  Diane Patterson

## 2011-07-01 NOTE — Telephone Encounter (Signed)
LMOM for pt TCB.  Per previous phone note from 06/22/11, RA wants pt seen with TP or whoever has soonest avail spot to f/u on recent ER visit.  TP and MW have avail openings tomorrow afternoon.

## 2011-07-01 NOTE — Telephone Encounter (Signed)
lmomtcb x1 

## 2011-07-05 ENCOUNTER — Other Ambulatory Visit (INDEPENDENT_AMBULATORY_CARE_PROVIDER_SITE_OTHER): Payer: Medicare Other

## 2011-07-05 ENCOUNTER — Encounter: Payer: Self-pay | Admitting: Adult Health

## 2011-07-05 ENCOUNTER — Ambulatory Visit (INDEPENDENT_AMBULATORY_CARE_PROVIDER_SITE_OTHER): Payer: Medicare Other | Admitting: Adult Health

## 2011-07-05 DIAGNOSIS — J449 Chronic obstructive pulmonary disease, unspecified: Secondary | ICD-10-CM

## 2011-07-05 DIAGNOSIS — R300 Dysuria: Secondary | ICD-10-CM

## 2011-07-05 LAB — URINALYSIS, ROUTINE W REFLEX MICROSCOPIC
Bilirubin Urine: NEGATIVE
Ketones, ur: NEGATIVE
Leukocytes, UA: NEGATIVE
Urobilinogen, UA: 0.2 (ref 0.0–1.0)

## 2011-07-05 NOTE — Assessment & Plan Note (Signed)
Resolving COPD exacerbation  She is improved. CXR recently with no acute changes.  Will check Urine /cx to r/o UTI   Plan  Mucinex DM Twice daily  As needed  Cough/congestion  Continue on Symbicort 2 puffs Twice daily   I will call with urine results  Keep working on quitting smoking.  Please contact office for sooner follow up if symptoms do not improve or worsen or seek emergency care  follow up Dr. Vassie Loll  In 3 months  If back/side does not improve will need to see primary care doctor.

## 2011-07-05 NOTE — Progress Notes (Signed)
Subjective:    Patient ID: Diane Patterson, female    DOB: 22-Feb-1946, 66 y.o.   MRN: [id number]  HPI  66/F, 2 pack per day smoker for FU of Gold stg IV COPD  FEV1 was 27% in 12/09. CXR 12/09 hyperinflation   03/05/09  Continues to smoke  She describes an early morning cough, and occasional wheezing.  she does not use any bronchodilator inhalers. She does not desire any medications to quit smoking but feels that she can do this on her own if she applies her mind to it.   May 21, 2009 9:06 AM  quit x 3 wks, woke up hurting all over & went back to smoking, quit x 9 days, wants a nerve pill, does not want to get' hooked', afraid of heart attacks with patches  Pt c/o increased S.O.B. Pt states has been using ProAir twice daily instead of Symbicort. >> given Rx for pred, nicotine & welbutrin  09/11/2010 - 15 mnth FU 3 cigs/day, if she has a flare -1 puff  symbicort helps Gained 8 lbs over last yr, not needed prednisone all of last yr Turned 66 last yr, not bothered by pollen as much >>no changes, cxr with chronic changes   01/19/11 Follow up  Presents for follow up . She says she is doing well with no flare in symptoms.  She has cut down to 2 cigs /daily. Takes Symbicort everyday.  No increased cough or congestion. No weight loss or hemopytsis .   >no changes   07/05/2011 ER follow up  Pt seen in ER 2 weeks ago with COPD flare . XRay with no acute process. Tx with doxcycline and steroid taper. She had had Flu prior to this , tx with Tamiflu . She is feeling better with resolved cough and dyspnea. Had developed some intermittent dysuria x 2 and right sided back/side pain on/off. No hematuria or abdominal pain. No fever .  No pain meds used.  Encouraged on smoking cesstation   Review of Systems Constitutional:   No  weight loss, night sweats,  Fevers, chills, fatigue, or  lassitude.  HEENT:   No headaches,  Difficulty swallowing,  Tooth/dental problems, or  Sore throat,      No sneezing, itching, ear ache, nasal congestion, post nasal drip,   CV:  No chest pain,  Orthopnea, PND, swelling in lower extremities, anasarca, dizziness, palpitations, syncope.   GI  No heartburn, indigestion, abdominal pain, nausea, vomiting, diarrhea, change in bowel habits, loss of appetite, bloody stools.   Resp: No shortness of breath with exertion or at rest.  No excess mucus, no productive cough,  No non-productive cough,  No coughing up of blood.  No change in color of mucus.  No wheezing.  No chest wall deformity  Skin: no rash or lesions.  GU:  change in color of urine    no hematuria   MS:  No joint pain or swelling.  No decreased range of motion.    Psych:  No change in mood or affect. No depression or anxiety.  No memory loss.         Objective:   Physical Exam  Gen. Pleasant, well-nourished, in no distress ENT - no lesions, no post nasal drip Neck: No JVD, no thyromegaly, no carotid bruits Lungs: no use of accessory muscles, no dullness to percussion, clear without rales or rhonchi  Cardiovascular: Rhythm regular, heart sounds  normal, no murmurs or gallops, no peripheral edema Abd: no guarding or rebound.  Neg CVA tenderness.  Musculoskeletal: No deformities, no cyanosis or clubbing          Assessment & Plan:

## 2011-07-05 NOTE — Patient Instructions (Signed)
Mucinex DM Twice daily  As needed  Cough/congestion  Continue on Symbicort 2 puffs Twice daily   I will call with urine results  Keep working on quitting smoking.  Please contact office for sooner follow up if symptoms do not improve or worsen or seek emergency care  follow up Dr. Vassie Loll  In 3 months  If back/side does not improve will need to see primary care doctor.

## 2011-07-06 NOTE — Telephone Encounter (Signed)
Pt seen by TP 07/05/11

## 2011-07-08 LAB — URINE CULTURE

## 2011-07-15 ENCOUNTER — Other Ambulatory Visit: Payer: Self-pay | Admitting: Adult Health

## 2011-07-19 ENCOUNTER — Ambulatory Visit: Payer: Medicare Other | Admitting: Pulmonary Disease

## 2011-09-30 ENCOUNTER — Ambulatory Visit (INDEPENDENT_AMBULATORY_CARE_PROVIDER_SITE_OTHER): Payer: Medicare Other | Admitting: Pulmonary Disease

## 2011-09-30 ENCOUNTER — Encounter: Payer: Self-pay | Admitting: Pulmonary Disease

## 2011-09-30 VITALS — BP 138/68 | HR 88 | Temp 97.6°F | Ht 68.0 in | Wt 137.4 lb

## 2011-09-30 DIAGNOSIS — F17200 Nicotine dependence, unspecified, uncomplicated: Secondary | ICD-10-CM

## 2011-09-30 DIAGNOSIS — J449 Chronic obstructive pulmonary disease, unspecified: Secondary | ICD-10-CM

## 2011-09-30 MED ORDER — NICOTINE 10 MG IN INHA: 2.0000 | RESPIRATORY_TRACT | Status: AC | PRN

## 2011-09-30 MED ORDER — BUDESONIDE-FORMOTEROL FUMARATE 80-4.5 MCG/ACT IN AERO: 2.0000 | INHALATION_SPRAY | Freq: Two times a day (BID) | RESPIRATORY_TRACT | Status: DC

## 2011-09-30 NOTE — Assessment & Plan Note (Signed)
Stay on symbicort Keep up with walking for exercise Doubt roflimulast needed here

## 2011-09-30 NOTE — Assessment & Plan Note (Signed)
Trial of nicotrol inhaler -upto 2 per day Smoking cessation most important intervention here

## 2011-09-30 NOTE — Patient Instructions (Signed)
Trial of nicotrol inhaler -upto 2 per day Stay on symbicort Keep up with walking for exercise

## 2011-09-30 NOTE — Progress Notes (Signed)
  Subjective:    Patient ID: Diane Patterson, female    DOB: December 28, 1945, 66 y.o.   MRN: [id number]  HPI  65/F, 2 pack per day smoker for FU of Gold stg IV COPD  FEV1 was 27% in 12/09. CXR 12/09 hyperinflation  ABout 1 flare/ year needing steroids  09/30/2011 1/13 - ER  Visit for COPD flare - doxcycline and steroid taper. She had had Flu prior to this , tx with Tamiflu .   Down to 5 cigs/d Pt states her breathing has been fine since on the symbicort. Occasional wheezing w/ the pollen in air. Pt has no complaints today  Review of Systems Constitutional:   No  weight loss, night sweats,  Fevers, chills, fatigue, or  lassitude.  HEENT:   No headaches,  Difficulty swallowing,  Tooth/dental problems, or  Sore throat,                No sneezing, itching, ear ache, nasal congestion, post nasal drip,   CV:  No chest pain,  Orthopnea, PND, swelling in lower extremities, anasarca, dizziness, palpitations, syncope.   GI  No heartburn, indigestion, abdominal pain, nausea, vomiting, diarrhea, change in bowel habits, loss of appetite, bloody stools.   Resp: No shortness of breath with exertion or at rest.  No excess mucus, no productive cough,  No non-productive cough,  No coughing up of blood.  No change in color of mucus.  No wheezing.  No chest wall deformity  Skin: no rash or lesions.  GU:  change in color of urine    no hematuria   MS:  No joint pain or swelling.  No decreased range of motion.    Psych:  No change in mood or affect. No depression or anxiety.  No memory loss.         Objective:   Physical Exam  Gen. Pleasant, well-nourished, in no distress ENT - no lesions, no post nasal drip Neck: No JVD, no thyromegaly, no carotid bruits Lungs: no use of accessory muscles, no dullness to percussion, decreased without rales or rhonchi  Cardiovascular: Rhythm regular, heart sounds  normal, no murmurs or gallops, no peripheral edema Musculoskeletal: No deformities, no cyanosis or  clubbing          Assessment & Plan:

## 2011-12-19 ENCOUNTER — Encounter (HOSPITAL_COMMUNITY): Payer: Self-pay | Admitting: Emergency Medicine

## 2011-12-19 ENCOUNTER — Emergency Department (HOSPITAL_COMMUNITY)
Admission: EM | Admit: 2011-12-19 | Discharge: 2011-12-20 | Disposition: A | Discharge status: Home / Self Care | Payer: Medicare Other | Attending: Emergency Medicine | Admitting: Emergency Medicine

## 2011-12-19 DIAGNOSIS — F17200 Nicotine dependence, unspecified, uncomplicated: Secondary | ICD-10-CM | POA: Insufficient documentation

## 2011-12-19 DIAGNOSIS — Z825 Family history of asthma and other chronic lower respiratory diseases: Secondary | ICD-10-CM | POA: Insufficient documentation

## 2011-12-19 DIAGNOSIS — Z833 Family history of diabetes mellitus: Secondary | ICD-10-CM | POA: Insufficient documentation

## 2011-12-19 DIAGNOSIS — Z8249 Family history of ischemic heart disease and other diseases of the circulatory system: Secondary | ICD-10-CM | POA: Insufficient documentation

## 2011-12-19 DIAGNOSIS — I10 Essential (primary) hypertension: Secondary | ICD-10-CM | POA: Insufficient documentation

## 2011-12-19 HISTORY — DX: Essential (primary) hypertension: I10

## 2011-12-19 LAB — COMPREHENSIVE METABOLIC PANEL
BUN: 16 mg/dL (ref 6–23)
CO2: 28 mEq/L (ref 19–32)
Calcium: 9.3 mg/dL (ref 8.4–10.5)
Chloride: 102 mEq/L (ref 96–112)
Creatinine, Ser: 0.72 mg/dL (ref 0.50–1.10)
GFR calc Af Amer: >90 mL/min (ref >90)
GFR calc non Af Amer: 88 mL/min — ABNORMAL LOW (ref >90)
Glucose, Bld: 123 mg/dL — ABNORMAL HIGH (ref 70–99)
Total Bilirubin: 0.2 mg/dL — ABNORMAL LOW (ref 0.3–1.2)

## 2011-12-19 LAB — CBC WITH DIFFERENTIAL/PLATELET
Basophils Absolute: 0 10*3/uL (ref 0.0–0.1)
HCT: 43.9 % (ref 36.0–46.0)
Hemoglobin: 14.6 g/dL (ref 12.0–15.0)
Lymphocytes Relative: 22 % (ref 12–46)
Lymphs Abs: 3.2 10*3/uL (ref 0.7–4.0)
Monocytes Absolute: 1 10*3/uL (ref 0.1–1.0)
Neutro Abs: 10 10*3/uL — ABNORMAL HIGH (ref 1.7–7.7)
RBC: 4.83 MIL/uL (ref 3.87–5.11)
RDW: 14.3 % (ref 11.5–15.5)
WBC: 14.5 10*3/uL — ABNORMAL HIGH (ref 4.0–10.5)

## 2011-12-19 NOTE — ED Notes (Signed)
Patient complaining of hypertension; states that she took her blood pressure at home (215/88) and called Dr. Frederik Pear office.  Was referred to the ED.  Patient reports being asymptomatic; denies headache, dizziness, chest pain, and shortness of breath.

## 2011-12-19 NOTE — ED Notes (Signed)
Spoke with Dondra Spry PA, pt symptoms have resolved and BP is normotensive, able to be seen in Fast track at this time

## 2011-12-20 NOTE — ED Provider Notes (Signed)
History     CSN: [id number]  Arrival date & time 12/19/11  2110   None     Chief Complaint  Patient presents with  . Hypertension    (Consider location/radiation/quality/duration/timing/severity/associated sxs/prior treatment) HPI Comments: Patient has not been taking her HTN medications on a regular basis since February about 5 days ago received a Kenalog injection for poison Oak and BP has been intermittently elevated since.  Tonight it was 220/85 but normalized on arrival to the ED   Patient is a 66 y.o. female presenting with hypertension. The history is provided by the patient.  Hypertension This is a recurrent problem. The current episode started yesterday. The problem has been gradually improving. Associated symptoms include headaches. Pertinent negatives include no abdominal pain, fever, joint swelling, myalgias, nausea, neck pain, rash, sore throat, vomiting or weakness.    Past Medical History  Diagnosis Date  . Tobacco abuse   . Venous insufficiency   . Chronic airflow obstruction   . Hypertension     Past Surgical History  Procedure Date  . Vesicovaginal fistula closure w/ tah   . Neck surgery     Family History  Problem Relation Age of Onset  . Emphysema Father   . Asthma Father   . Heart disease Father   . Diabetes Mother     History  Substance Use Topics  . Smoking status: Current Everyday Smoker -- 0.3 packs/day for 15 years    Types: Cigarettes  . Smokeless tobacco: Not on file  . Alcohol Use: No    OB History    Grav Para Term Preterm Abortions TAB SAB Ect Mult Living                  Review of Systems  Constitutional: Negative for fever.  HENT: Negative for sore throat and neck pain.   Gastrointestinal: Negative for nausea, vomiting and abdominal pain.  Musculoskeletal: Negative for myalgias and joint swelling.  Skin: Negative for rash.  Neurological: Positive for headaches. Negative for dizziness and weakness.    Allergies  Review  of patient's allergies indicates no known allergies.  Home Medications   Current Outpatient Rx  Name Route Sig Dispense Refill  . IPRATROPIUM-ALBUTEROL 18-103 MCG/ACT IN AERO Inhalation Inhale 2 puffs into the lungs every 6 (six) hours as needed for wheezing or shortness of breath. 1 Inhaler 0  . BUDESONIDE-FORMOTEROL FUMARATE 80-4.5 MCG/ACT IN AERO Inhalation Inhale 2 puffs into the lungs 2 (two) times daily. 1 Inhaler 5  . DILTIAZEM HCL ER 90 MG PO CP12 Oral Take 90 mg by mouth daily as needed. For elevated blood pressure      BP 210/84  Pulse 89  Temp 98.4 F (36.9 C) (Oral)  Resp 18  SpO2 100%  Physical Exam  Constitutional: She is oriented to person, place, and time. She appears well-developed and well-nourished.  HENT:  Head: Normocephalic.  Eyes: Pupils are equal, round, and reactive to light.  Neck: Normal range of motion.  Cardiovascular: Normal rate.   Pulmonary/Chest: Effort normal.  Musculoskeletal: Normal range of motion.  Neurological: She is alert and oriented to person, place, and time.  Skin: Skin is warm.    ED Course  Procedures (including critical care time)  Labs Reviewed  CBC WITH DIFFERENTIAL - Abnormal; Notable for the following:    WBC 14.5 (*)     Neutro Abs 10.0 (*)     All other components within normal limits  COMPREHENSIVE METABOLIC PANEL - Abnormal; Notable for  the following:    Glucose, Bld 123 (*)     Total Bilirubin 0.2 (*)     GFR calc non Af Amer 88 (*)     All other components within normal limits  LAB REPORT - SCANNED   No results found.   1. HTN (hypertension)       MDM  I have encouraged patient to contact PCP and to take her meds daily until further notice         Arman Filter, NP 12/21/11 0215

## 2011-12-21 ENCOUNTER — Encounter: Payer: Self-pay | Admitting: Internal Medicine

## 2011-12-21 ENCOUNTER — Telehealth: Payer: Self-pay

## 2011-12-21 ENCOUNTER — Other Ambulatory Visit: Payer: Self-pay | Admitting: Internal Medicine

## 2011-12-21 ENCOUNTER — Ambulatory Visit (INDEPENDENT_AMBULATORY_CARE_PROVIDER_SITE_OTHER): Payer: Medicare Other | Admitting: Internal Medicine

## 2011-12-21 VITALS — BP 156/84 | HR 68 | Temp 98.5°F | Wt 134.8 lb

## 2011-12-21 DIAGNOSIS — F17200 Nicotine dependence, unspecified, uncomplicated: Secondary | ICD-10-CM

## 2011-12-21 DIAGNOSIS — I10 Essential (primary) hypertension: Secondary | ICD-10-CM | POA: Insufficient documentation

## 2011-12-21 NOTE — Telephone Encounter (Signed)
[facility] Triage Call Report Triage Record Num: [id number] Operator: Candida Peeling Patient Name: Diane Patterson Call Date & Time: 12/19/2011 8:12:48PM Patient Phone: [phone] PCP: Marga Melnick Patient Gender: Female PCP Fax : [phone] Patient DOB: [birth year] [facility] Name[facility] Reason for Call: Caller: Margie/Patient; PCP: Romero Belling; CB#: [phone]; Call regarding BP 215/88 ... Feels Fine But Wants To Take Another Diltiazem tonight due to elevated BP. Onset 12/19/11 1945 BP 215/88, took Diltizaem at 0530, has had sl headache and having hot flashes, intermittently. Had injection on 12/13/11 for poison ivy and BP has been up since; had quit taking BP med in February but had to restart this week. Alert. All emergent s/s r/o pwr Hypertension, Diagnosed or Suspected protocol with the exception of "Systolic blood pressure of more than 180 mmHg or diastolic blood pressure of more than 120 mmHg". RN advised evaluation in 4 hours. Caller will go to [facility] or ED. Protocol(s) Used: Hypertension, Diagnosed or Suspected Recommended Outcome per Protocol: See Provider within 4 hours Reason for Outcome: Systolic blood pressure of more than 180 mmHg OR diastolic blood pressure of more than 120 mmHg Care Advice: ~ Another adult should drive. Call EMS 911 if new symptoms develop, such as severe shortness of breath, chest pain, change in mental status, acute neurologic deficit, seizure, visual disturbances, pulse rate > 120 / minute, or very irregular pulse. ~ ~ List, or take, all current prescription(s), nonprescription or alternative medication(s) to provider for evaluation. 12/19/2011 8:28:34PM Page 1 of 1 [id number]

## 2011-12-21 NOTE — Assessment & Plan Note (Signed)
Risk and options discussed. She is considering electronic cigarettes

## 2011-12-21 NOTE — ED Provider Notes (Signed)
Medical screening examination/treatment/procedure(s) were performed by non-physician practitioner and as supervising physician I was immediately available for consultation/collaboration.  Sunnie Nielsen, MD 12/21/11 [phone]

## 2011-12-21 NOTE — Progress Notes (Signed)
  Subjective:    Patient ID: Diane Patterson, female    DOB: [birth year], 66 y.o.   MRN: [id number]  HPI Her blood pressure was noted to be 223/99 5 days ago; 2 days after receiving a shot for poison oak. She checked her blood pressure because of a slight headache She does not know whether she recived Depo-Medrol .  She was seen in the emergency room 12/19/11 for persistently elevated blood pressure. It was measured as 201/99. She had been off her blood pressure medicines since February; she discontinued it because her blood pressures had been running in the normal range. In the emergency room they gave her a dose of her medicine prior to releasing her. She was told to take her blood pressure pill twice a day; she did that only on 7/7. Since then she's taken 1 a day   She is not monitoring her blood pressure since the emergency room visit. She does restrict salt in her diet Smoking 1 ppd          Review of Systems She is not had chest pain, dyspnea, claudication, lightheadedness, urinary frequency, or edema.     Objective:   Physical Exam She appears  well-nourished;she is in no acute distress. Weathered facies  No carotid bruits are present but rumble on R.  Heart rhythm and rate are normal with no significant murmurs or gallops. S4 with slurring    Chest is clear but BS distant  There is no evidence of aortic aneurysm or renal artery bruits but aorta palpable  She has no clubbing or edema.   Pedal pulses are decreased especially PTP   No ischemic skin changes are present but plethora of feet        Assessment & Plan:

## 2011-12-21 NOTE — Patient Instructions (Addendum)
Blood Pressure Average Goal MINIMALLY is < 140/90. Ideally goal  is an AVERAGE < 135/85. This AVERAGE should be calculated from @ least 5-7 BP readings taken @ different times of day on different days of week. You should not respond to isolated BP readings , but rather the AVERAGE for that week. Please increase the diltiazem to 180 mg daily if the blood pressure is not at goal on the 90 mg dose

## 2011-12-21 NOTE — Assessment & Plan Note (Signed)
Blood pressure risks & goals discussed. I recommended she take 180 mg of diltiazem the day, but she is reluctant to do so.

## 2012-01-13 ENCOUNTER — Other Ambulatory Visit: Payer: Self-pay

## 2012-01-13 MED ORDER — DILTIAZEM HCL ER 90 MG PO CP12: ORAL_CAPSULE | ORAL | Status: DC

## 2012-01-13 NOTE — Telephone Encounter (Signed)
Pharmacist from [facility] called to say patient states Cardizem rx was changed to bid and they need new instructions. I reviewed last OV note and per Dr.Hopper patient to increase to 2 pills daily IF b/p not controlled on the 90 mg. I gave instruction 1-2 x daily #60/5 refills. Call ended

## 2012-01-31 ENCOUNTER — Encounter: Payer: Self-pay | Admitting: Adult Health

## 2012-01-31 ENCOUNTER — Ambulatory Visit (INDEPENDENT_AMBULATORY_CARE_PROVIDER_SITE_OTHER): Payer: Medicare Other | Admitting: Adult Health

## 2012-01-31 VITALS — BP 150/90 | HR 71 | Temp 97.6°F | Ht 67.0 in | Wt 132.6 lb

## 2012-01-31 DIAGNOSIS — J449 Chronic obstructive pulmonary disease, unspecified: Secondary | ICD-10-CM

## 2012-01-31 MED ORDER — BUDESONIDE-FORMOTEROL FUMARATE 80-4.5 MCG/ACT IN AERO: 2.0000 | INHALATION_SPRAY | Freq: Two times a day (BID) | RESPIRATORY_TRACT | Status: DC

## 2012-01-31 NOTE — Addendum Note (Signed)
Addended by: Boone Master E on: 01/31/2012 09:40 AM   Modules accepted: Orders

## 2012-01-31 NOTE — Assessment & Plan Note (Addendum)
Compensated on present regimen.  Cont on current regimen.  Encouraged on smoking cessation  Pneumovax utd

## 2012-01-31 NOTE — Patient Instructions (Addendum)
Continue on current regimen.  Most important goal is to  Quit smoking  follow up Dr. Vassie Loll  In 4 months and As needed   Call back Mid September for flu shot

## 2012-01-31 NOTE — Progress Notes (Signed)
  Subjective:    Patient ID: Diane Patterson, female    DOB: 1945/08/13, 66 y.o.   MRN: [id number]  HPI  65/F, 2 pack per day smoker for FU of Gold stg IV COPD  FEV1 was 27% in 12/09. CXR 12/09 hyperinflation  ABout 1 flare/ year needing steroids  09/30/2011 1/13 - ER  Visit for COPD flare - doxcycline and steroid taper. She had had Flu prior to this , tx with Tamiflu .   Down to 5 cigs/d Pt states her breathing has been fine since on the symbicort. Occasional wheezing w/ the pollen in air. Pt has no complaints today >>no changes   01/31/2012 4 month follow up  Returns for follow up .  Has been doing well since last ov with no ER or Hospital visits.  No increased SABA use.  Continues ADLs, shopping and housework without difficulty  No fever , chest pain or edema Still smoking 1 PPD , discussed in detail smoking cessation  Declined chantix or wellbutrin    Review of Systems Constitutional:   No  weight loss, night sweats,  Fevers, chills, fatigue, or  lassitude.  HEENT:   No headaches,  Difficulty swallowing,  Tooth/dental problems, or  Sore throat,                No sneezing, itching, ear ache, nasal congestion, post nasal drip,   CV:  No chest pain,  Orthopnea, PND, swelling in lower extremities, anasarca, dizziness, palpitations, syncope.   GI  No heartburn, indigestion, abdominal pain, nausea, vomiting, diarrhea, change in bowel habits, loss of appetite, bloody stools.   Resp No excess mucus, no productive cough,  No non-productive cough,  No coughing up of blood.  No change in color of mucus.  No wheezing.  No chest wall deformity  Skin: no rash or lesions.  GU:  change in color of urine    no hematuria   MS:  No joint pain or swelling.  No decreased range of motion.    Psych:  No change in mood or affect. No depression or anxiety.  No memory loss.         Objective:   Physical Exam  Gen. Pleasant, well-nourished, in no distress ENT - no lesions, no post  nasal drip Neck: No JVD, no thyromegaly, no carotid bruits Lungs: no use of accessory muscles, no dullness to percussion, decreased without rales or rhonchi  Cardiovascular: Rhythm regular, heart sounds  normal, no murmurs or gallops, no peripheral edema Musculoskeletal: No deformities, no cyanosis or clubbing          Assessment & Plan:

## 2012-03-06 ENCOUNTER — Ambulatory Visit (INDEPENDENT_AMBULATORY_CARE_PROVIDER_SITE_OTHER): Payer: Medicare Other

## 2012-03-06 DIAGNOSIS — Z23 Encounter for immunization: Secondary | ICD-10-CM

## 2012-03-17 ENCOUNTER — Other Ambulatory Visit: Payer: Self-pay | Admitting: Pulmonary Disease

## 2012-04-24 ENCOUNTER — Telehealth: Payer: Self-pay | Admitting: Pulmonary Disease

## 2012-04-24 MED ORDER — IPRATROPIUM-ALBUTEROL 20-100 MCG/ACT IN AERS: 1.0000 | INHALATION_SPRAY | Freq: Four times a day (QID) | RESPIRATORY_TRACT | Status: DC

## 2012-04-24 NOTE — Telephone Encounter (Signed)
Pt last seen 8.19.13 by TP for follow up, return to see RA in 4 months >> pt is on recall list for Dec 2013.  Called spoke with patient who stated that she has lost her combivent inhaler and is request a script be sent for a new one.  Advised pt would be happy to do this for her, but warned her that the device is different from the last one.  Advised pt to ask her pharmacist to show her how to use the respimat or call the office if needed.  Pt is also aware that the dosing instructions have changed slightly from 2 puffs every 6 hours to 1 puff every 6 hours PRN.

## 2012-04-24 NOTE — Telephone Encounter (Signed)
[email or web address] does she need symbicort or combivent? Carron Curie, CMA

## 2012-04-24 NOTE — Telephone Encounter (Signed)
Patient called back and stated she needs combivent inhaler.

## 2012-06-27 ENCOUNTER — Ambulatory Visit: Payer: Medicare Other | Admitting: Internal Medicine

## 2012-07-04 ENCOUNTER — Ambulatory Visit (INDEPENDENT_AMBULATORY_CARE_PROVIDER_SITE_OTHER): Payer: Medicare Other | Admitting: Internal Medicine

## 2012-07-04 ENCOUNTER — Encounter: Payer: Self-pay | Admitting: Internal Medicine

## 2012-07-04 VITALS — BP 144/88 | HR 76 | Temp 97.7°F | Wt 136.0 lb

## 2012-07-04 DIAGNOSIS — J209 Acute bronchitis, unspecified: Secondary | ICD-10-CM

## 2012-07-04 DIAGNOSIS — J449 Chronic obstructive pulmonary disease, unspecified: Secondary | ICD-10-CM

## 2012-07-04 DIAGNOSIS — Z833 Family history of diabetes mellitus: Secondary | ICD-10-CM

## 2012-07-04 DIAGNOSIS — R7309 Other abnormal glucose: Secondary | ICD-10-CM

## 2012-07-04 DIAGNOSIS — F17200 Nicotine dependence, unspecified, uncomplicated: Secondary | ICD-10-CM

## 2012-07-04 LAB — HEMOGLOBIN A1C: Hgb A1c MFr Bld: 6.2 % (ref 4.6–6.5)

## 2012-07-04 MED ORDER — HYDROCODONE-HOMATROPINE 5-1.5 MG/5ML PO SYRP: 5.0000 mL | ORAL_SOLUTION | Freq: Four times a day (QID) | ORAL | Status: DC | PRN

## 2012-07-04 MED ORDER — DOXYCYCLINE HYCLATE 100 MG PO TABS: 100.0000 mg | ORAL_TABLET | Freq: Two times a day (BID) | ORAL | Status: DC

## 2012-07-04 NOTE — Patient Instructions (Addendum)
Please think about quitting smoking. Review the risks we discussed. Please call 1-800-QUIT-NOW (513-215-8809) for free smoking cessation counseling. There are multiple options are to help you stop smoking. These include nicotine patches, nicotine gum, and the new "E cigarette".  Use A & D ointment twice a day to lip lesions

## 2012-07-04 NOTE — Progress Notes (Signed)
  Subjective:    Patient ID: Diane Patterson, female    DOB: January 07, 1946, 67 y.o.   MRN: [id number]  HPI The respiratory tract symptoms began last week as  rhinitis, chest congestion , &  cough with clear  sputum.  Significant active  associated symptoms include frontal headache  Cough is  associated with  shortness of breath and wheezing; but these are chronic symptoms . Occasional  chills   present   Myalgias and arthralgias were present in arms  Flu shot current    Treatment with   Tylenol & Rx cough med was partially effective   There is no history of COPD. The patient is  smoking 1 ppd               Review of Systems Symptoms not present include facial pain, dental pain, sore throat, nasal purulence, earache , and otic discharge Fever & sweats not present  Significant itchy , watery eyes & sneezing were not noted.  Glucose was 123 in 7/13; mother was diabetic        Objective:   Physical Exam  General appearance:adequate nourished; no acute distress or increased work of breathing is present.  No  lymphadenopathy about the head, neck, or axilla noted.  Eyes: No conjunctival inflammation or lid edema is present.  Ears:  External ear exam shows no significant lesions or deformities.  Otoscopic examination reveals clear canals, tympanic membranes are intact bilaterally without bulging, retraction, inflammation or discharge. Nose:  External nasal examination shows no deformity or inflammation. Nasal mucosa are dry  without lesions or exudates. Minimal  septal  deviation.No obstruction to airflow.  Oral exam: Dental hygiene is good; lips and gums are healthy appearing.There is no oropharyngeal erythema or exudate noted.  Neck:  No deformities, masses, or tenderness noted.    Heart:  Normal rate and regular rhythm. S1 and S2 normal without gallop, murmur, click, rub or other extra sounds.  Lungs:Chest clear to auscultation; but dramatically decreased BS.No wheezes,  rhonchi,rales ,or rubs present.No increased work of breathing. Extremities:  No cyanosis or edema. Slight  clubbing  noted  Skin: Warm & dry . Ecchymoses of hands.cheilosis @ corners of mouth.          Assessment & Plan:  #1 acute bronchitis in context COPD w/o bronchospasm #2 hyperglycemia with FH DM Plan: See orders and recommendations

## 2012-07-10 ENCOUNTER — Encounter: Payer: Self-pay | Admitting: Pulmonary Disease

## 2012-07-10 ENCOUNTER — Ambulatory Visit (INDEPENDENT_AMBULATORY_CARE_PROVIDER_SITE_OTHER): Payer: Medicare Other | Admitting: Pulmonary Disease

## 2012-07-10 VITALS — BP 116/80 | HR 76 | Temp 98.2°F | Ht 67.5 in | Wt 134.0 lb

## 2012-07-10 DIAGNOSIS — J449 Chronic obstructive pulmonary disease, unspecified: Secondary | ICD-10-CM

## 2012-07-10 NOTE — Progress Notes (Signed)
  Subjective:    Patient ID: Diane Patterson, female    DOB: 08-24-1945, 67 y.o.   MRN: [id number]  HPI  67/F, 2 pack per day smoker for FU of Gold stg IV COPD  FEV1 was 27% in 12/09. CXR 12/09 hyperinflation  ABout 1 flare/ year needing steroids   1/13 - ER Visit for COPD flare - doxcycline and steroid taper.    07/10/2012 On e cig- down to 1/2 PPD Breathing is unchanged. Reports having "cold" sx and was given doxycycline.  Denies chest pain, chest tightenss, SOB or coughing. Took flu shot, no fevers, sputum is sticky white C/o skin being thinner & easy to bruise -also cataracts    Review of Systems neg for any significant sore throat, dysphagia, itching, sneezing, nasal congestion or excess/ purulent secretions, fever, chills, sweats, unintended wt loss, pleuritic or exertional cp, hempoptysis, orthopnea pnd or change in chronic leg swelling. Also denies presyncope, palpitations, heartburn, abdominal pain, nausea, vomiting, diarrhea or change in bowel or urinary habits, dysuria,hematuria, rash, arthralgias, visual complaints, headache, numbness weakness or ataxia.     Objective:   Physical Exam   Gen. Pleasant, well-nourished, in no distress ENT - no lesions, no post nasal drip Neck: No JVD, no thyromegaly, no carotid bruits Lungs: no use of accessory muscles, no dullness to percussion, clear without rales or rhonchi  Cardiovascular: Rhythm regular, heart sounds  normal, no murmurs or gallops, no peripheral edema Musculoskeletal: No deformities, no cyanosis or clubbing         Assessment & Plan:

## 2012-07-10 NOTE — Patient Instructions (Signed)
Stay on symbicort twice daily Complete antibiotic course OK to use e-cigarette

## 2012-07-10 NOTE — Assessment & Plan Note (Addendum)
Stay on symbicort twice daily - I doubt that easy bruising & cataracts are due to inhaled steroids - keep at low dose of 80 Complete antibiotic course OK to use e-cigarette - we discussed risks & benefits

## 2012-07-13 ENCOUNTER — Telehealth: Payer: Self-pay | Admitting: Pulmonary Disease

## 2012-07-13 MED ORDER — IPRATROPIUM-ALBUTEROL 20-100 MCG/ACT IN AERS: 2.0000 | INHALATION_SPRAY | Freq: Four times a day (QID) | RESPIRATORY_TRACT | Status: DC | PRN

## 2012-07-13 NOTE — Telephone Encounter (Signed)
I spoke with pt. She stated she is needing her combivent resp sent to [facility]. I advised pt will send RX. Nothing further was needed

## 2012-07-22 ENCOUNTER — Other Ambulatory Visit: Payer: Self-pay | Admitting: Pulmonary Disease

## 2012-07-24 ENCOUNTER — Telehealth: Payer: Self-pay | Admitting: Pulmonary Disease

## 2012-07-24 MED ORDER — BUDESONIDE-FORMOTEROL FUMARATE 80-4.5 MCG/ACT IN AERO: 2.0000 | INHALATION_SPRAY | Freq: Two times a day (BID) | RESPIRATORY_TRACT | Status: DC

## 2012-07-24 NOTE — Telephone Encounter (Signed)
Rx has been sent in, pt is aware. 

## 2012-09-22 ENCOUNTER — Encounter: Payer: Self-pay | Admitting: Family Medicine

## 2012-09-22 DIAGNOSIS — J45909 Unspecified asthma, uncomplicated: Secondary | ICD-10-CM | POA: Insufficient documentation

## 2012-09-25 ENCOUNTER — Ambulatory Visit (INDEPENDENT_AMBULATORY_CARE_PROVIDER_SITE_OTHER): Payer: Medicare Other | Admitting: Physician Assistant

## 2012-09-25 ENCOUNTER — Encounter: Payer: Self-pay | Admitting: Physician Assistant

## 2012-09-25 VITALS — BP 136/82 | HR 68 | Temp 98.3°F | Resp 18 | Ht 65.0 in | Wt 132.0 lb

## 2012-09-25 DIAGNOSIS — B070 Plantar wart: Secondary | ICD-10-CM

## 2012-09-25 NOTE — Progress Notes (Signed)
   Patient ID: RAMIAH HELFRICH MRN: [id number], DOB: July 06, 1945, 67 y.o. Date of Encounter: 09/25/2012, 4:52 PM    Chief Complaint:  Chief Complaint  Patient presents with  . something on bottom rt great toe     HPI: 67 y.o. year old female says it feels like she has stepped on something-feels something pressing into bottom of right first toe.    Home Meds: Current Outpatient Prescriptions on File Prior to Visit  Medication Sig Dispense Refill  . budesonide-formoterol (SYMBICORT) 80-4.5 MCG/ACT inhaler Inhale 2 puffs into the lungs 2 (two) times daily.  10.2 g  4  . diltiazem (CARDIZEM SR) 90 MG 12 hr capsule Take 90 mg by mouth daily. For elevated blood pressure      . doxycycline (VIBRA-TABS) 100 MG tablet Take 1 tablet (100 mg total) by mouth 2 (two) times daily.  20 tablet  0  . HYDROcodone-homatropine (HYDROMET) 5-1.5 MG/5ML syrup Take 5 mLs by mouth every 6 (six) hours as needed for cough.  120 mL  0  . Ipratropium-Albuterol (COMBIVENT RESPIMAT) 20-100 MCG/ACT AERS respimat Inhale 2 puffs into the lungs every 6 (six) hours as needed for wheezing or shortness of breath.  1 Inhaler  3   No current facility-administered medications on file prior to visit.    Allergies: No Known Allergies    Review of Systems:  Constitutional: negative for chills, fever All othr ros negative.   Physical Exam: Blood pressure 136/82, pulse 68, temperature 98.3 F (36.8 C), temperature source Oral, resp. rate 18, height 5\' 5"  (1.651 m), weight 132 lb (59.875 kg)., Body mass index is 21.97 kg/(m^2). General: Well developed, well nourished,WF.  in no acute distress. Neck: Supple. No thyromegaly. Full ROM. No lymphadenopathy. Lungs: Clear bilaterally to auscultation without wheezes, rales, or rhonchi. Breathing is unlabored. Heart: RRR with S1 S2. No murmurs, rubs, or gallops appreciated. Msk:  Strength and tone normal for age. Extremities/Skin: Right first toe-Dorsal aspect- there is large  amount of callous with plantar wart in center.  Neuro: Alert and oriented X 3. Moves all extremities spontaneously. Gait is normal. CNII-XII grossly in tact. Psych:  Responds to qns appropriately with a normal affect.    ASSESSMENT AND PLAN:  67 y.o. year old female with  1. Plantar wart of right foot Callous debrided using a blade. Cryotherapy then applied x 4 freeze-thaw cycles. RTC for repeat cryo in 2 weeks.   [address] [place], [place], [facility] 09/25/2012 4:52 PM

## 2012-11-17 ENCOUNTER — Telehealth: Payer: Self-pay | Admitting: Pulmonary Disease

## 2012-11-17 NOTE — Telephone Encounter (Signed)
Spoke with patient, patient returning recall phone call for appt Patient has been scheduled for 6 month f/u for Wed Jul 30 10am Nothing further needed at this time

## 2012-11-22 ENCOUNTER — Other Ambulatory Visit: Payer: Self-pay | Admitting: Pulmonary Disease

## 2012-11-23 ENCOUNTER — Telehealth: Payer: Self-pay | Admitting: Pulmonary Disease

## 2012-11-23 NOTE — Telephone Encounter (Signed)
We called RX in 11/22/12. I called [facility] and confirmed they did receive RX.   I called and made pt aware. Nothing further was needed

## 2012-12-20 ENCOUNTER — Encounter: Payer: Self-pay | Admitting: Pulmonary Disease

## 2012-12-20 ENCOUNTER — Ambulatory Visit (INDEPENDENT_AMBULATORY_CARE_PROVIDER_SITE_OTHER): Payer: Medicare Other | Admitting: Pulmonary Disease

## 2012-12-20 VITALS — BP 142/80 | HR 70 | Temp 97.9°F | Ht 67.0 in | Wt 130.4 lb

## 2012-12-20 DIAGNOSIS — J4489 Other specified chronic obstructive pulmonary disease: Secondary | ICD-10-CM

## 2012-12-20 DIAGNOSIS — J449 Chronic obstructive pulmonary disease, unspecified: Secondary | ICD-10-CM

## 2012-12-20 MED ORDER — NYSTATIN 100000 UNIT/ML MT SUSP: 500000.0000 [IU] | Freq: Two times a day (BID) | OROMUCOSAL | Status: DC

## 2012-12-20 NOTE — Patient Instructions (Addendum)
STOP symbicort until thrush clears Nystatin 5ml PO twice daily x 7 days If this does not clear , call us back STOP smoking 

## 2012-12-20 NOTE — Progress Notes (Signed)
  Subjective:    Patient ID: Diane Patterson, female    DOB: Feb 20, 1946, 67 y.o.   MRN: [id number]  HPI  66/F, 2 pack per day smoker for FU of Gold stg IV COPD  FEV1 was 27% in 12/09. CXR 12/09 hyperinflation  ABout 1 flare/ year needing steroids  1/13 - ER Visit for COPD flare - doxcycline and steroid taper.    12/20/2012 Acute OV - saw dentist  Pt c/o sore throat, white patches in mouth x 3 days. It hurts to swallow, mouth burns at times. She is gargling with warm salt water On e cig- down to 1/2 PPD  Breathing is unchanged.  Review of Systems neg for any significant sore throat, dysphagia, itching, sneezing, nasal congestion or excess/ purulent secretions, fever, chills, sweats, unintended wt loss, pleuritic or exertional cp, hempoptysis, orthopnea pnd or change in chronic leg swelling. Also denies presyncope, palpitations, heartburn, abdominal pain, nausea, vomiting, diarrhea or change in bowel or urinary habits, dysuria,hematuria, rash, arthralgias, visual complaints, headache, numbness weakness or ataxia.     Objective:   Physical Exam  Gen. Pleasant, well-nourished, in no distress ENT - thrush +, no post nasal drip Neck: No JVD, no thyromegaly, no carotid bruits Lungs: no use of accessory muscles, no dullness to percussion, clear without rales or rhonchi  Cardiovascular: Rhythm regular, heart sounds  normal, no murmurs or gallops, no peripheral edema Musculoskeletal: No deformities, no cyanosis or clubbing        Assessment & Plan:

## 2012-12-20 NOTE — Assessment & Plan Note (Signed)
STOP symbicort until thrush clears Nystatin 5ml PO twice daily x 7 days If this does not clear , call us back STOP smoking

## 2013-01-05 ENCOUNTER — Encounter: Payer: Self-pay | Admitting: Family Medicine

## 2013-01-05 ENCOUNTER — Ambulatory Visit (INDEPENDENT_AMBULATORY_CARE_PROVIDER_SITE_OTHER): Payer: Medicare Other | Admitting: Family Medicine

## 2013-01-05 VITALS — BP 134/92 | HR 86 | Temp 98.1°F | Resp 16 | Wt 130.0 lb

## 2013-01-05 DIAGNOSIS — H60399 Other infective otitis externa, unspecified ear: Secondary | ICD-10-CM

## 2013-01-05 DIAGNOSIS — H60391 Other infective otitis externa, right ear: Secondary | ICD-10-CM

## 2013-01-05 MED ORDER — NEOMYCIN-POLYMYXIN-HC 3.5-10000-1 OT SOLN: 3.0000 [drp] | Freq: Four times a day (QID) | OTIC | Status: DC

## 2013-01-05 NOTE — Progress Notes (Signed)
  Subjective:    Patient ID: Diane Patterson, female    DOB: 05/31/1946, 67 y.o.   MRN: [id number]  HPI 2 weeks ago the patient was at the beach. After using a Q-tip in her right ear she developed chronic aching pain in the right ear.  She denies fevers or chills. She denies URI symptoms. She denies cough or congestion. She denies sore throat. She denies TMJ type pain.  She states it hurts to lay on her ear at night due to the aching pain. She denies tinnitus or hearing loss or vertigo.  Past Medical History  Diagnosis Date  . Tobacco abuse   . Venous insufficiency   . Chronic airflow obstruction   . Hypertension   . Asthma    Current Outpatient Prescriptions on File Prior to Visit  Medication Sig Dispense Refill  . diltiazem (CARDIZEM SR) 90 MG 12 hr capsule Take 90 mg by mouth daily. For elevated blood pressure      . Ipratropium-Albuterol (COMBIVENT RESPIMAT) 20-100 MCG/ACT AERS respimat Inhale 2 puffs into the lungs every 6 (six) hours as needed for wheezing or shortness of breath.  1 Inhaler  3  . nystatin (MYCOSTATIN) 100000 UNIT/ML suspension Take 5 mLs (500,000 Units total) by mouth 2 (two) times daily.  120 mL  0  . SYMBICORT 80-4.5 MCG/ACT inhaler INHALE 2 PUFFS INTO LUNGS TWICE DAILY  1 Inhaler  3   No current facility-administered medications on file prior to visit.   No Known Allergies History   Social History  . Marital Status: Married    Spouse Name: N/A    Number of Children: 1  . Years of Education: N/A   Occupational History  . Not on file.   Social History Main Topics  . Smoking status: Current Every Day Smoker -- 0.01 packs/day for 15 years    Types: Cigarettes  . Smokeless tobacco: Not on file     Comment: spokes about 1-2 cigs daily--12/20/12  . Alcohol Use: No  . Drug Use: No  . Sexually Active: Not on file   Other Topics Concern  . Not on file   Social History Narrative  . No narrative on file      Review of Systems  All other systems  reviewed and are negative.       Objective:   Physical Exam  Vitals reviewed. HENT:  Right Ear: There is drainage. Tympanic membrane is injected. Tympanic membrane is not scarred, not perforated, not retracted and not bulging. Tympanic membrane mobility is normal. No middle ear effusion.  Cardiovascular: Normal rate and regular rhythm.   Pulmonary/Chest: Effort normal and breath sounds normal.   the right external auditory canal is erythematous with trace purulent exudate        Assessment & Plan:  1. Otitis, externa, infective, right Begin Cortisporin otic 3 drops in ear 4 times a day for 10 days. Recheck next week if no better. - neomycin-polymyxin-hydrocortisone (CORTISPORIN) otic solution; Place 3 drops into the right ear 4 (four) times daily.  Dispense: 10 mL; Refill: 0

## 2013-01-10 ENCOUNTER — Ambulatory Visit (INDEPENDENT_AMBULATORY_CARE_PROVIDER_SITE_OTHER): Payer: Medicare Other | Admitting: Adult Health

## 2013-01-10 ENCOUNTER — Other Ambulatory Visit: Payer: Medicare Other

## 2013-01-10 ENCOUNTER — Encounter: Payer: Self-pay | Admitting: Adult Health

## 2013-01-10 VITALS — BP 140/76 | HR 59 | Temp 96.0°F | Ht 67.0 in | Wt 131.0 lb

## 2013-01-10 DIAGNOSIS — J029 Acute pharyngitis, unspecified: Secondary | ICD-10-CM

## 2013-01-10 DIAGNOSIS — B370 Candidal stomatitis: Secondary | ICD-10-CM | POA: Insufficient documentation

## 2013-01-10 LAB — BETA STREP SCREEN: Streptococcus, Group A Screen (Direct): NEGATIVE

## 2013-01-10 MED ORDER — IPRATROPIUM-ALBUTEROL 20-100 MCG/ACT IN AERS: 2.0000 | INHALATION_SPRAY | Freq: Four times a day (QID) | RESPIRATORY_TRACT | Status: DC | PRN

## 2013-01-10 MED ORDER — FLUCONAZOLE 100 MG PO TABS: 100.0000 mg | ORAL_TABLET | Freq: Every day | ORAL | Status: DC

## 2013-01-10 NOTE — Addendum Note (Signed)
Addended by: Boone Master E on: 01/10/2013 12:06 PM   Modules accepted: Orders

## 2013-01-10 NOTE — Progress Notes (Signed)
  Subjective:    Patient ID: Diane Patterson, female    DOB: 1946/05/17, 67 y.o.   MRN: [id number]  HPI   66/F, 2 pack per day smoker for FU of Gold stg IV COPD  FEV1 was 27% in 12/09. CXR 12/09 hyperinflation  ABout 1 flare/ year needing steroids  1/13 - ER Visit for COPD flare - doxcycline and steroid taper.    12/20/12  Acute OV - saw dentist Pt c/o sore throat, white patches in mouth x 3 days. It hurts to swallow, mouth burns at times. She is gargling with warm salt water On e cig- down to 1/2 PPD  Breathing is unchanged. >nystatin rx   01/10/2013 Acute OV  Complains of Sorethroat and coating on tongue is no better - Finished Nystatin 1 week ago - Started back on Symbicort yesterday - SOB the same  - Pt reports that she doesnt have a rx for Combivent.  No weight loss, dysphagia, hemoptysis, chest pain or wheeizng.  Still smoking, cessation discussed  Review of Systems  neg for any significant sore throat, dysphagia, itching, sneezing, nasal congestion or excess/ purulent secretions, fever, chills, sweats, unintended wt loss, pleuritic or exertional cp, hempoptysis, orthopnea pnd or change in chronic leg swelling. Also denies presyncope, palpitations, heartburn, abdominal pain, nausea, vomiting, diarrhea or change in bowel or urinary habits, dysuria,hematuria, rash, arthralgias, visual complaints, headache, numbness weakness or ataxia.     Objective:   Physical Exam   Gen. Pleasant, well-nourished, in no distress ENT - thrush + buccal mucosa, tongue and post pharynx  no post nasal drip Neck: No JVD, no thyromegaly, no carotid bruits Lungs: no use of accessory muscles, no dullness to percussion, clear without rales or rhonchi  Cardiovascular: Rhythm regular, heart sounds  normal, no murmurs or gallops, no peripheral edema Musculoskeletal: No deformities, no cyanosis or clubbing        Assessment & Plan:

## 2013-01-10 NOTE — Patient Instructions (Addendum)
Diflucan 100mg  2 tabs first day then 1 daily until gone.  Brush/rinse and gargle after inhaler use.  May try Armor/Hammer baking soda/peroxide toothpaste and mouthwash Stop smoking  Follow up Dr. Vassie Loll  In 3 months and As needed

## 2013-01-10 NOTE — Assessment & Plan Note (Signed)
Persistent oral candidiasis ? Inhaler related /smoking , etc Neg beta strep test If not improving will need referral to ENT  Has been seen by dentist  Plan  Diflucan 100mg  2 tabs first day then 1 daily until gone.  Brush/rinse and gargle after inhaler use.  May try Armor/Hammer baking soda/peroxide toothpaste and mouthwash Stop smoking  Follow up Dr. Vassie Loll  In 3 months and As needed

## 2013-01-12 NOTE — Progress Notes (Signed)
Quick Note:  Called spoke with patient, advised of lab results / recs as stated by TP. Pt verbalized her understanding and denied any questions. ______ 

## 2013-01-18 ENCOUNTER — Ambulatory Visit (INDEPENDENT_AMBULATORY_CARE_PROVIDER_SITE_OTHER): Payer: Medicare Other | Admitting: Internal Medicine

## 2013-01-18 ENCOUNTER — Encounter: Payer: Self-pay | Admitting: Internal Medicine

## 2013-01-18 VITALS — BP 120/76 | HR 60 | Temp 98.1°F | Wt 130.0 lb

## 2013-01-18 DIAGNOSIS — M2669 Other specified disorders of temporomandibular joint: Secondary | ICD-10-CM

## 2013-01-18 DIAGNOSIS — R634 Abnormal weight loss: Secondary | ICD-10-CM

## 2013-01-18 DIAGNOSIS — M26629 Arthralgia of temporomandibular joint, unspecified side: Secondary | ICD-10-CM

## 2013-01-18 MED ORDER — TRAMADOL HCL 50 MG PO TABS: 50.0000 mg | ORAL_TABLET | Freq: Four times a day (QID) | ORAL | Status: DC | PRN

## 2013-01-18 NOTE — Patient Instructions (Addendum)
Soft or liquid diet if having pain at the temporomandibular joints. Use warm moist compresses to 3 times a day over the painful area.Consider glucosamine sulfate 1500 mg daily for TMJ symptoms. Take this daily  for 4-6 weeks. This will rehydrate the cartilages.

## 2013-01-18 NOTE — Progress Notes (Signed)
  Subjective:    Patient ID: Diane Patterson, female    DOB: [birth year], 67 y.o.   MRN: [id number]  HPI   Symptoms began 3 weeks ago after she employed a Q-tip to clean out the left ear. She has sharp  pain in and around the ear which radiates superiorly and inferiorly.  She was seen in family practice clinic and given an antibiotic eardrops which she completed 4 days ago. The symptoms have not improved.  Recently she was treated for oral thrush by Dr Vassie Loll within past 4 weeks .    Review of Systems  She's had no otic discharge, fever, chills, or sweats. She also denies hearing loss or tinnitus. She is not having frontal headache, facial pain, nasal purulence, or dental pain.  Her Ophth questioned possible thyroid disease. She has lost 4 # .     Objective:   Physical Exam  General appearance:adequately nourished; no acute distress or increased work of breathing is present.  No  lymphadenopathy about the head, neck, or axilla noted.   Eyes: No conjunctival inflammation or lid edema is present. No proptosis or lid lag.    Ears:  External ear exam shows no significant lesions or deformities.  Otoscopic examination reveals clear canals, tympanic membranes are intact bilaterally without bulging, retraction, inflammation or discharge.  Nose:  External nasal examination shows no deformity or inflammation. Nasal mucosa are dry without lesions or exudates. No septal dislocation or deviation.No obstruction to airflow.   Oral exam: Dental hygiene is good; lips and gums are healthy appearing.There is no oropharyngeal erythema or exudate noted. She exhibits pain in the area of the left TMJ joint with mastication maneuvers. There is tenderness on the left but not the right  Neck:  No deformities,  masses, or tenderness noted. Thyroid normal.  Slightly decreased range of motion without pain.   Extremities:  No cyanosis, edema, or clubbing  noted    Skin: Warm & dry          Assessment &  Plan:  #1 TMJ  See plan

## 2013-01-19 LAB — TSH: TSH: 1.79 u[IU]/mL (ref 0.35–5.50)

## 2013-01-27 ENCOUNTER — Other Ambulatory Visit: Payer: Self-pay | Admitting: Internal Medicine

## 2013-01-27 DIAGNOSIS — I10 Essential (primary) hypertension: Secondary | ICD-10-CM

## 2013-03-05 ENCOUNTER — Other Ambulatory Visit: Payer: Self-pay | Admitting: Pulmonary Disease

## 2013-03-06 ENCOUNTER — Telehealth: Payer: Self-pay | Admitting: Pulmonary Disease

## 2013-03-06 NOTE — Telephone Encounter (Signed)
I received the response from [facility] this AM and sent this in for pt.  lmomtcb x1 for pt

## 2013-03-06 NOTE — Telephone Encounter (Signed)
Pt called pt and is aware. Nothing further needed

## 2013-03-29 ENCOUNTER — Ambulatory Visit: Payer: Medicare Other | Admitting: Pulmonary Disease

## 2013-03-30 ENCOUNTER — Telehealth: Payer: Self-pay | Admitting: Pulmonary Disease

## 2013-03-30 ENCOUNTER — Ambulatory Visit (INDEPENDENT_AMBULATORY_CARE_PROVIDER_SITE_OTHER): Payer: Medicare Other

## 2013-03-30 DIAGNOSIS — Z23 Encounter for immunization: Secondary | ICD-10-CM

## 2013-03-30 MED ORDER — BUDESONIDE-FORMOTEROL FUMARATE 80-4.5 MCG/ACT IN AERO: 2.0000 | INHALATION_SPRAY | Freq: Two times a day (BID) | RESPIRATORY_TRACT | Status: DC

## 2013-03-30 NOTE — Telephone Encounter (Signed)
Rx has been sent in. Pt is aware. 

## 2013-05-11 ENCOUNTER — Encounter: Payer: Self-pay | Admitting: Pulmonary Disease

## 2013-05-11 ENCOUNTER — Ambulatory Visit (INDEPENDENT_AMBULATORY_CARE_PROVIDER_SITE_OTHER): Payer: Medicare Other | Admitting: Pulmonary Disease

## 2013-05-11 VITALS — BP 138/82 | HR 74 | Temp 97.4°F | Ht 67.0 in | Wt 133.0 lb

## 2013-05-11 DIAGNOSIS — J449 Chronic obstructive pulmonary disease, unspecified: Secondary | ICD-10-CM

## 2013-05-11 MED ORDER — BUDESONIDE-FORMOTEROL FUMARATE 80-4.5 MCG/ACT IN AERO: 2.0000 | INHALATION_SPRAY | Freq: Two times a day (BID) | RESPIRATORY_TRACT | Status: DC

## 2013-05-11 MED ORDER — AZITHROMYCIN 250 MG PO TABS: ORAL_TABLET | ORAL | Status: DC

## 2013-05-11 NOTE — Assessment & Plan Note (Signed)
Refill on symbicort Z-pak for acute bronchitis We discussed risks & benefits of screening CT scan for lung cancer & will schedule Take advil twice daily with food x 5 days for backpain & call if no better You have to stop smoking

## 2013-05-11 NOTE — Progress Notes (Signed)
   Subjective:    Patient ID: Diane Patterson, female    DOB: [birth year], 67 y.o.   MRN: [id number]  HPI  66/F, 2 pack per day smoker for FU of Gold stg IV COPD  FEV1 was 27% in 12/09. CXR 12/09 hyperinflation  ABout 1 flare/ year needing steroids    05/11/2013  Chief Complaint  Patient presents with  . Follow-up    Pt c/o some SOB with exertion, prod cough with clear mucous.  Pt also c/o mid back pain, believes it's from sitting in a chair wrong X3days ago.   C/o mid thoracic back pain C/o cough with green phlegm & chills x 1d Smokes 4 cigs/f  Review of Systems neg for any significant sore throat, dysphagia, itching, sneezing, nasal congestion or excess/ purulent secretions, fever, chills, sweats, unintended wt loss, pleuritic or exertional cp, hempoptysis, orthopnea pnd or change in chronic leg swelling. Also denies presyncope, palpitations, heartburn, abdominal pain, nausea, vomiting, diarrhea or change in bowel or urinary habits, dysuria,hematuria, rash, arthralgias, visual complaints, headache, numbness weakness or ataxia.     Objective:   Physical Exam  Gen. Pleasant, well-nourished, in no distress ENT - no lesions, no post nasal drip Neck: No JVD, no thyromegaly, no carotid bruits Lungs: no use of accessory muscles, no dullness to percussion, clear without rales or rhonchi  Cardiovascular: Rhythm regular, heart sounds  normal, no murmurs or gallops, no peripheral edema Musculoskeletal: No deformities, no cyanosis or clubbing        Assessment & Plan:

## 2013-05-11 NOTE — Patient Instructions (Signed)
Refill on symbicort Z-pak We discussed risks & benefits of screening CT scan for lung cancer & will schedule Take advil twice daily with food x 5 days for backpain & call if no better You have to stop smoking

## 2013-05-16 ENCOUNTER — Other Ambulatory Visit: Payer: Medicare Other

## 2013-05-21 ENCOUNTER — Telehealth: Payer: Self-pay | Admitting: Pulmonary Disease

## 2013-05-21 MED ORDER — ALBUTEROL SULFATE HFA 108 (90 BASE) MCG/ACT IN AERS: 2.0000 | INHALATION_SPRAY | Freq: Four times a day (QID) | RESPIRATORY_TRACT | Status: DC | PRN

## 2013-05-21 NOTE — Telephone Encounter (Signed)
lmomtcb x1 

## 2013-05-21 NOTE — Telephone Encounter (Signed)
Spouse aware rx has been sent. Nothing further needed 

## 2013-06-13 ENCOUNTER — Inpatient Hospital Stay: Admission: RE | Admit: 2013-06-13 | Payer: Medicare Other | Source: Ambulatory Visit

## 2013-06-18 ENCOUNTER — Other Ambulatory Visit: Payer: Self-pay | Admitting: Pulmonary Disease

## 2013-08-01 ENCOUNTER — Other Ambulatory Visit: Payer: Self-pay | Admitting: Internal Medicine

## 2013-08-01 ENCOUNTER — Telehealth: Payer: Self-pay

## 2013-08-01 NOTE — Telephone Encounter (Signed)
The patient called and is hoping to get a refill on her blood pressure meds.    Callback - [phone]

## 2013-08-01 NOTE — Telephone Encounter (Signed)
Rx was refilled (08-01-13).//AB/CMA

## 2013-08-01 NOTE — Telephone Encounter (Signed)
Rx sent to the pharmacy by e-script.//AB/CMA 

## 2013-08-13 ENCOUNTER — Ambulatory Visit: Payer: Medicare Other | Admitting: Internal Medicine

## 2013-09-24 ENCOUNTER — Telehealth: Payer: Self-pay | Admitting: Pulmonary Disease

## 2013-09-24 MED ORDER — BUDESONIDE-FORMOTEROL FUMARATE 80-4.5 MCG/ACT IN AERO: 2.0000 | INHALATION_SPRAY | Freq: Two times a day (BID) | RESPIRATORY_TRACT | Status: DC

## 2013-09-24 NOTE — Telephone Encounter (Signed)
Rx has been sent in. Pt is aware. 

## 2013-09-26 ENCOUNTER — Ambulatory Visit (INDEPENDENT_AMBULATORY_CARE_PROVIDER_SITE_OTHER): Payer: Medicare Other | Admitting: Internal Medicine

## 2013-09-26 ENCOUNTER — Telehealth: Payer: Self-pay | Admitting: Internal Medicine

## 2013-09-26 ENCOUNTER — Encounter: Payer: Self-pay | Admitting: Internal Medicine

## 2013-09-26 ENCOUNTER — Other Ambulatory Visit (INDEPENDENT_AMBULATORY_CARE_PROVIDER_SITE_OTHER): Payer: Medicare Other

## 2013-09-26 VITALS — BP 140/72 | HR 72 | Temp 97.9°F | Ht 67.25 in | Wt 134.6 lb

## 2013-09-26 DIAGNOSIS — I10 Essential (primary) hypertension: Secondary | ICD-10-CM

## 2013-09-26 DIAGNOSIS — J449 Chronic obstructive pulmonary disease, unspecified: Secondary | ICD-10-CM

## 2013-09-26 DIAGNOSIS — T148XXA Other injury of unspecified body region, initial encounter: Secondary | ICD-10-CM

## 2013-09-26 DIAGNOSIS — D489 Neoplasm of uncertain behavior, unspecified: Secondary | ICD-10-CM

## 2013-09-26 DIAGNOSIS — Z85828 Personal history of other malignant neoplasm of skin: Secondary | ICD-10-CM

## 2013-09-26 LAB — CBC WITH DIFFERENTIAL/PLATELET
BASOS ABS: 0.1 10*3/uL (ref 0.0–0.1)
Basophils Relative: 0.8 % (ref 0.0–3.0)
Eosinophils Absolute: 0.4 10*3/uL (ref 0.0–0.7)
Eosinophils Relative: 3.7 % (ref 0.0–5.0)
HEMATOCRIT: 42.6 % (ref 36.0–46.0)
HEMOGLOBIN: 14 g/dL (ref 12.0–15.0)
LYMPHS ABS: 2.8 10*3/uL (ref 0.7–4.0)
Lymphocytes Relative: 25 % (ref 12.0–46.0)
MCHC: 32.8 g/dL (ref 30.0–36.0)
MCV: 90 fl (ref 78.0–100.0)
MONOS PCT: 7.1 % (ref 3.0–12.0)
Monocytes Absolute: 0.8 10*3/uL (ref 0.1–1.0)
NEUTROS ABS: 7.1 10*3/uL (ref 1.4–7.7)
Neutrophils Relative %: 63.4 % (ref 43.0–77.0)
Platelets: 254 10*3/uL (ref 150.0–400.0)
RBC: 4.73 Mil/uL (ref 3.87–5.11)
RDW: 14.5 % (ref 11.5–14.6)
WBC: 11.1 10*3/uL — ABNORMAL HIGH (ref 4.5–10.5)

## 2013-09-26 LAB — BASIC METABOLIC PANEL
BUN: 16 mg/dL (ref 6–23)
CHLORIDE: 101 meq/L (ref 96–112)
CO2: 31 meq/L (ref 19–32)
Calcium: 9.4 mg/dL (ref 8.4–10.5)
Creatinine, Ser: 1.1 mg/dL (ref 0.4–1.2)
GFR: 55.31 mL/min — AB (ref >60.00)
GLUCOSE: 114 mg/dL — AB (ref 70–99)
POTASSIUM: 4.1 meq/L (ref 3.5–5.1)
SODIUM: 141 meq/L (ref 135–145)

## 2013-09-26 MED ORDER — DILTIAZEM HCL ER 90 MG PO CP12
ORAL_CAPSULE | ORAL | Status: DC
Start: 2013-09-26 — End: 2014-03-03

## 2013-09-26 MED ORDER — DILTIAZEM HCL ER 90 MG PO CP12: ORAL_CAPSULE | ORAL | Status: DC

## 2013-09-26 MED ORDER — ALBUTEROL SULFATE HFA 108 (90 BASE) MCG/ACT IN AERS: 2.0000 | INHALATION_SPRAY | Freq: Four times a day (QID) | RESPIRATORY_TRACT | Status: DC | PRN

## 2013-09-26 NOTE — Telephone Encounter (Signed)
Relevant patient education mailed to patient.  

## 2013-09-26 NOTE — Progress Notes (Signed)
Pre visit review using our clinic review tool, if applicable. No additional management support is needed unless otherwise documented below in the visit note. 

## 2013-09-26 NOTE — Patient Instructions (Signed)
Your next office appointment will be determined based upon review of your pending labs . Those instructions will be transmitted to you through My Chart  OR  by mail;whichever process is your choice to receive results & recommendations .  Minimal Blood Pressure Goal= AVERAGE < 140/90;  Ideal is an AVERAGE < 135/85. This AVERAGE should be calculated from @ least 5-7 BP readings taken @ different times of day on different days of week. You should not respond to isolated BP readings , but rather the AVERAGE for that week .Please bring your  blood pressure cuff to office visits to verify that it is reliable.It  can also be checked against the blood pressure device at the pharmacy. Finger or wrist cuffs are not dependable; an arm cuff is. 

## 2013-09-26 NOTE — Progress Notes (Signed)
   Subjective:    Patient ID: Diane Patterson, female    DOB: 1945/11/10, 68 y.o.   MRN: [id number]  HPI 1. She is here for Cardizem refill; desires 1-year refill if possible. She is not monitoring her blood pressures. 2. Refill for Pro-Air inhaler; reports she is using rescue inhaler once daily.  3. Would like referral to derm for removal of " cyst " noted during cataract surgery last June.  4. Bruising on dorsal hands which she wonders if related to BP medication.   Review of Systems She denies chest pain, palpitations, dyspnea, edema, claudication, lightheadedness, syncope, weight gain/loss.  denies epistaxis, hemoptysis, hematuria, melena, or rectal bleeding.has no unexplained weight loss, dysphagia, or abdominal pain. No persistently small caliber stools She  has no abnormal bruising or bleeding.  has no difficulty stopping bleeding with injury.     Objective:   Physical Exam General appearance is one of good health and nourishment w/o distress.  Eyes: No conjunctival inflammation or scleral icterus is present. Localized hypopigmentation at the medial aspect of the right eye. Oral exam: Dental hygiene is good; lips and gums are healthy appearing.There is no oropharyngeal erythema or exudate noted.  Heart: Normal rate and regular rhythm. S1 and S2 normal without gallop, murmur, click, rub or other extra sounds  Lungs:decreased breath sounds; no wheezes, rhonchi,rales ,or rubs present.No increased work of breathing.  Abdomen: bowel sounds normal, soft and non-tender without masses, organomegaly or hernias noted. No guarding or rebound. No tenderness over the flanks to percussion  Musculoskeletal: Able to lie flat and sit up without help. Negative straight leg raising bilaterally. Gait normal  Skin:Warm & dry. Intact without suspicious lesions or rashes ; no jaundice or tenting . Tiny slightly rough lesion in the right paranasal area superiorly. Scattered faint bruising over the dorsum  of the hands and forearms. Lymphatic: No lymphadenopathy is noted about the head, neck, axilla areas.         Assessment & Plan:  #1 hypertension; blood pressure goals discussed  #2 bruising  #3 skin lesion in the right paranasal area in the context of history of basal cell cancer; dermatology referral made  #4 COPD; she is diligently trying to stop smoking  See orders and after visit recommendations

## 2013-09-26 NOTE — Progress Notes (Signed)
   Subjective:    Patient ID: Diane Patterson, female    DOB: September 11, 1945, 68 y.o.   MRN: [id number]  HPI   1. She is here for Cardizem refill; desires 1-year refill if possible. She is not monitoring her BP at home. She has reduced her smoking from 2.5 ppd to 0.5 ppd and reports she is sleeping better and feels better with this change.  2. Refill for Pro-Air inhaler; reports she is using rescue inhaler once daily.  3. Would like referral to derm for cyst removal noted during cataract surgery last June.  4. Bruising on dorsal hands, she believes in context of BP medication; has noticed bruises which she wonders if related to BP medication.   Review of Systems She denies chest pain, palpitations, dyspnea, edema, claudication, lightheadedness, syncope, weight gain/loss.      Objective:   Physical Exam General appearance is one of good health and nourishment w/o distress.  Eyes: No conjunctival inflammation or scleral icterus is present.  Oral exam: Dental hygiene is good; lips and gums are healthy appearing.There is no oropharyngeal erythema or exudate noted.   Heart:  Normal rate and regular rhythm. S1 and S2 normal without gallop, murmur, click, rub or other extra sounds    Lungs:decreased breath sounds; no wheezes, rhonchi,rales ,or rubs present.No increased work of breathing.   Abdomen: bowel sounds normal, soft and non-tender without masses, organomegaly or hernias noted.  No guarding or rebound . No tenderness over the flanks to percussion  Musculoskeletal: Able to lie flat and sit up without help. Negative straight leg raising bilaterally. Gait normal  Skin:Warm & dry.  Intact without suspicious lesions or rashes ; no jaundice or tenting  Lymphatic: No lymphadenopathy is noted about the head, neck, axilla, or inguinal areas.        Assessment & Plan:  #1 abnormal bruising - CBC, BMP -  Refill meds

## 2013-09-26 NOTE — Progress Notes (Deleted)
   Subjective:    Patient ID: Diane Patterson, female    DOB: Jun 29, 1945, 68 y.o.   MRN: [id number]  HPI    Review of Systems     Objective:   Physical Exam        Assessment & Plan:

## 2013-10-01 ENCOUNTER — Ambulatory Visit: Payer: Medicare Other

## 2013-10-01 ENCOUNTER — Telehealth: Payer: Self-pay

## 2013-10-01 DIAGNOSIS — R7309 Other abnormal glucose: Secondary | ICD-10-CM

## 2013-10-01 LAB — HEMOGLOBIN A1C: Hgb A1c MFr Bld: 5.9 % (ref 4.6–6.5)

## 2013-10-01 NOTE — Telephone Encounter (Signed)
Request for a1c add on has been faxed to lab 

## 2013-10-01 NOTE — Telephone Encounter (Signed)
Message copied by Shelly Coss on Mon Oct 01, 2013  8:43 AM ------      Message from: Hendricks Limes      Created: Sun Sep 30, 2013  5:54 PM       Please add A1c (790.29)       ------

## 2013-11-08 ENCOUNTER — Ambulatory Visit: Payer: Medicare Other | Admitting: Adult Health

## 2013-11-12 ENCOUNTER — Telehealth: Payer: Self-pay | Admitting: Pulmonary Disease

## 2013-11-12 ENCOUNTER — Other Ambulatory Visit: Payer: Self-pay | Admitting: Pulmonary Disease

## 2013-11-12 MED ORDER — BUDESONIDE-FORMOTEROL FUMARATE 80-4.5 MCG/ACT IN AERO: 2.0000 | INHALATION_SPRAY | Freq: Two times a day (BID) | RESPIRATORY_TRACT | Status: DC

## 2013-11-12 NOTE — Telephone Encounter (Signed)
Pt aware to keep pending appt and RX has been sent. Nothing further needed

## 2013-11-14 ENCOUNTER — Ambulatory Visit (INDEPENDENT_AMBULATORY_CARE_PROVIDER_SITE_OTHER): Payer: Medicare Other | Admitting: Adult Health

## 2013-11-14 ENCOUNTER — Encounter: Payer: Self-pay | Admitting: Adult Health

## 2013-11-14 VITALS — BP 136/72 | HR 91 | Temp 98.2°F | Ht 67.0 in | Wt 129.8 lb

## 2013-11-14 DIAGNOSIS — Z23 Encounter for immunization: Secondary | ICD-10-CM

## 2013-11-14 DIAGNOSIS — J449 Chronic obstructive pulmonary disease, unspecified: Secondary | ICD-10-CM

## 2013-11-14 MED ORDER — BUDESONIDE-FORMOTEROL FUMARATE 80-4.5 MCG/ACT IN AERO: 2.0000 | INHALATION_SPRAY | Freq: Two times a day (BID) | RESPIRATORY_TRACT | Status: DC

## 2013-11-14 NOTE — Assessment & Plan Note (Addendum)
Compensated on present regimen  Encouraged on smoking cessation  Declines cxr today , agrees to do on return ov in 6 mon  Plan  Prevnar vaccine today .  Continue on current regimen.  Most important goal is to  Quit smoking  Follow up Dr. Elsworth Soho  In 6 months and As needed

## 2013-11-14 NOTE — Progress Notes (Signed)
   Subjective:    Patient ID: Diane Patterson, female    DOB: [birth year], 68 y.o.   MRN: [id number]  HPI 68/F, 2 pack per day smoker for FU of Gold stg IV COPD  FEV1 was 27% in 12/09. CXR 12/09 hyperinflation  ABout 1 flare/ year needing steroids    11/14/2013  Chief Complaint  Patient presents with  . Follow-up    RA pt here for 6 month COPD follow up - reports breathing is doing well since last ov.  no new complaints.  Returns for a 6 month follow up for COPD  No ER or Hospital admits.  No increased SABA use Remains on Symbicort  Continues to smoke, counseled on smoking cessation.  No flare in cough , wheezing or dyspnea.  Denies hemoptysis, chest pain , edema or n/v./d    Review of Systems  neg for any significant sore throat, dysphagia, itching, sneezing, nasal congestion or excess/ purulent secretions, fever, chills, sweats, unintended wt loss, pleuritic or exertional cp, hempoptysis, orthopnea pnd or change in chronic leg swelling. Also denies presyncope, palpitations, heartburn, abdominal pain, nausea, vomiting, diarrhea or change in bowel or urinary habits, dysuria,hematuria, rash, arthralgias, visual complaints, headache, numbness weakness or ataxia.     Objective:   Physical Exam   Gen. Pleasant, well-nourished, in no distress ENT - no lesions, no post nasal drip Neck: No JVD, no thyromegaly, no carotid bruits Lungs: no use of accessory muscles, no dullness to percussion, clear without rales or rhonchi  Cardiovascular: Rhythm regular, heart sounds  normal, no murmurs or gallops, no peripheral edema Musculoskeletal: No deformities, no cyanosis or clubbing        Assessment & Plan:

## 2013-11-14 NOTE — Patient Instructions (Signed)
Prevnar vaccine today .  Continue on current regimen.  Most important goal is to  Quit smoking  Follow up Dr. Elsworth Soho  In 6 months and As needed

## 2013-11-14 NOTE — Addendum Note (Signed)
Addended by: Parke Poisson E on: 11/14/2013 10:01 AM   Modules accepted: Orders

## 2013-11-20 NOTE — Progress Notes (Signed)
Reviewed & agree with plan  

## 2013-12-20 ENCOUNTER — Ambulatory Visit (INDEPENDENT_AMBULATORY_CARE_PROVIDER_SITE_OTHER): Payer: Medicare Other | Admitting: Internal Medicine

## 2013-12-20 ENCOUNTER — Encounter: Payer: Self-pay | Admitting: Internal Medicine

## 2013-12-20 VITALS — BP 156/92 | HR 84 | Temp 98.4°F | Wt 128.4 lb

## 2013-12-20 DIAGNOSIS — K1370 Unspecified lesions of oral mucosa: Secondary | ICD-10-CM

## 2013-12-20 MED ORDER — CLOTRIMAZOLE 10 MG MT TROC: 10.0000 mg | Freq: Every day | OROMUCOSAL | Status: DC

## 2013-12-20 NOTE — Progress Notes (Signed)
   Subjective:    Patient ID: Diane Patterson, female    DOB: 08-19-1945, 68 y.o.   MRN: [id number]  HPI   She describes sore throat for the last 2-3 weeks. The last several days she's noted intraoral lesions over the left buccal mucosa. She states that she's had a similar problem in the past which was treated with a "gargle"      Review of Systems  She denies any associated reflux symptoms or symptoms of upper respiratory tract infection  Specifically there is no dyspepsia, frontal headache, facial pain, nasal purulence, dental pain, otic pain, otic discharge.     Objective:   Physical Exam  Significant or distinguishing  findings on physical exam are documented first.  Below that are other systems examined & findings.  There are scattered small whitish lesions on the left buccal mucosa. These cannot be scraped off. There is no ulcer base suggested clinically. Nares are dry.  Breath sounds are decreased with rare inspiratory pops/wheeze.  General appearance:adequately nourished; no acute distress or increased work of breathing is present.  No  lymphadenopathy about the head, neck, or axilla noted.   Eyes: No conjunctival inflammation or lid edema is present. There is no scleral icterus.  Ears:  External ear exam shows no significant lesions or deformities.  Otoscopic examination reveals clear canals, tympanic membranes are intact bilaterally without bulging, retraction, inflammation or discharge.  Nose:  External nasal examination shows no deformity or inflammation.  No septal dislocation or deviation.No obstruction to airflow.   Oral exam: Dental hygiene is fair; lips and gums are healthy appearing.There is no oropharyngeal erythema or exudate noted.   Neck:  No deformities, thyromegaly, masses, or tenderness noted.     Heart:  Normal rate and regular rhythm. S1 and S2 normal without gallop, murmur, click, rub or other extra sounds.   Lungs:No increased work of  breathing.    Extremities:  No cyanosis, edema, or clubbing  noted    Skin: Warm & dry         Assessment & Plan:  #1 intraoral lesions; thrush versus aphthous ulcers versus lichen planus  See orders recommendations. If she's no better ENT referral indicated

## 2013-12-20 NOTE — Patient Instructions (Signed)
Clotrimazole lozenges dissolved orally & swallowed 5 times a day.

## 2013-12-20 NOTE — Progress Notes (Signed)
Pre visit review using our clinic review tool, if applicable. No additional management support is needed unless otherwise documented below in the visit note. 

## 2013-12-20 NOTE — Progress Notes (Signed)
   Subjective:    Patient ID: Diane Patterson, female    DOB: February 22, 1946, 68 y.o.   MRN: [id number]  HPI Pt presents with a 2 week history of sore throat, chills and mouth sores. The problem has worsened in the past week. The sores are scattered along the L buccal mucosa. She reports a burning sensation upon swallowing and constant L cheek pain. She has been using warm salt water rinses and taking tylenol for the pain with minimal relief. The pt is currently being treated for [facility] on her lower lip with a topical "chemo" by her dermatologist. She worries she may have ingested the medication, which has caused the mouth sores. She denies any further URI symptoms.   Pt was treated for this same issue 01/10/13 by Dr. Elsworth Soho. She was treated for thrush with PO diflucan and nystatin rinse. With no improvement she was given an alternate mouthwash that resolved the issue.   The pt does use symbicort and states she rinses her mouth thoroughly after each use. She is also currently smoking 1ppd with a long history of smoking. Her dental care is up to date with no issues per pt.   Review of Systems  Constitutional: Negative for fever and fatigue.  HENT: Negative for congestion, ear discharge, ear pain, rhinorrhea and sinus pressure.   Respiratory:       Pt has a chronic productive cough of clear sputum. COPD stage IV  Pt is SOB baseline        Objective:   Physical Exam  Scattered white plaques along L buccal mucosa  Scattered expiratory wheezes throughout bilateral lung fields      Assessment & Plan:

## 2014-01-02 ENCOUNTER — Telehealth: Payer: Self-pay | Admitting: Family Medicine

## 2014-01-02 NOTE — Telephone Encounter (Signed)
Called and LM for pt she is needing to schedule [facility] and CPE

## 2014-02-21 ENCOUNTER — Telehealth: Payer: Self-pay | Admitting: Pulmonary Disease

## 2014-02-21 ENCOUNTER — Encounter: Payer: Self-pay | Admitting: Emergency Medicine

## 2014-02-21 ENCOUNTER — Ambulatory Visit (INDEPENDENT_AMBULATORY_CARE_PROVIDER_SITE_OTHER): Payer: Medicare Other | Admitting: Emergency Medicine

## 2014-02-21 VITALS — BP 148/60 | HR 65 | Temp 97.0°F | Ht 67.5 in | Wt 126.0 lb

## 2014-02-21 DIAGNOSIS — J449 Chronic obstructive pulmonary disease, unspecified: Secondary | ICD-10-CM

## 2014-02-21 MED ORDER — PREDNISONE 10 MG PO TABS: ORAL_TABLET | ORAL | Status: DC

## 2014-02-21 MED ORDER — DOXYCYCLINE HYCLATE 100 MG PO TABS: 100.0000 mg | ORAL_TABLET | Freq: Two times a day (BID) | ORAL | Status: DC

## 2014-02-21 NOTE — Patient Instructions (Addendum)
Please take prednisone as directed - Take 40mg  daily for 3 days, then 30mg  daily for 3 days, then 20mg  daily for 3 days, then 10mg  daily for 3 days, then stop Take doxycycline 100mg  twice a day for 7 days as directed Continue your Symbicort twice a day Follow with Dr Elsworth Soho in 2-3 weeks to assess your progress.

## 2014-02-21 NOTE — Telephone Encounter (Signed)
Called pt. appt scheduled to see RB this afternoon. Nothing further needed

## 2014-02-21 NOTE — Progress Notes (Signed)
HPI:  67/F, 2 pack per day smoker for FU of COPD  FEV1 was 27% in 12/09. CXR 12/09 hyperinflation  ABout 1 flare/ year needing steroids  Acute OV 02/21/14 -- pt followed by Dr RA, still smoking, GOLD C COPD. Reports that she and her husband both started to nasal congestion and chest congestion, cough, chills. Her husband was sick first and then she followed soon after. Evolved productive sputum, tan/brown. Began to have wheeze at the same time.  Low grade fevers.    Past Medical History  Diagnosis Date  . Tobacco abuse   . Venous insufficiency   . Chronic airflow obstruction   . Hypertension   . Asthma      Family History  Problem Relation Age of Onset  . Emphysema Father   . Asthma Father   . Heart disease Father   . Diabetes Mother      History   Social History  . Marital Status: Married    Spouse Name: N/A    Number of Children: 1  . Years of Education: N/A   Occupational History  . Not on file.   Social History Main Topics  . Smoking status: Current Every Day Smoker -- 0.75 packs/day for 15 years    Types: Cigarettes  . Smokeless tobacco: Not on file     Comment: smokes about 4 cigarettes daily//11.28.14  . Alcohol Use: No  . Drug Use: No  . Sexual Activity: Not on file   Other Topics Concern  . Not on file   Social History Narrative  . No narrative on file     No Known Allergies   Outpatient Prescriptions Prior to Visit  Medication Sig Dispense Refill  . albuterol (PROVENTIL HFA;VENTOLIN HFA) 108 (90 BASE) MCG/ACT inhaler Inhale 2 puffs into the lungs every 6 (six) hours as needed for wheezing or shortness of breath.  1 Inhaler  2  . budesonide-formoterol (SYMBICORT) 80-4.5 MCG/ACT inhaler Inhale 2 puffs into the lungs 2 (two) times daily.  10.2 g  6  . diltiazem (CARDIZEM SR) 90 MG 12 hr capsule TAKE 1 CAPSULE BY MOUTH DAILY  90 capsule  3  . clotrimazole (MYCELEX) 10 MG troche Take 1 tablet (10 mg total) by mouth 5 (five) times daily.  35 tablet  0    No facility-administered medications prior to visit.    Filed Vitals:   02/21/14 1519  BP: 148/60  Pulse: 65  Temp: 97 F (36.1 C)  TempSrc: Oral  Height: 5' 7.5" (1.715 m)  Weight: 126 lb (57.153 kg)  SpO2: 94%   Gen: Pleasant, well-nourished, in no distress,  normal affect  ENT: No lesions,  mouth clear,  oropharynx clear, no postnasal drip  Neck: No JVD, no TMG, no carotid bruits  Lungs: No use of accessory muscles, B end exp wheezes  Cardiovascular: RRR, heart sounds normal, no murmur or gallops, no peripheral edema  Musculoskeletal: No deformities, no cyanosis or clubbing  Neuro: alert, non focal  Skin: Warm, no lesions or rashes   COPD (chronic obstructive pulmonary disease) With an acute flare that appears to be due to a URI.    Please take prednisone as directed - Take 40mg  daily for 3 days, then 30mg  daily for 3 days, then 20mg  daily for 3 days, then 10mg  daily for 3 days, then stop Take doxycycline 100mg  twice a day for 7 days as directed Continue your Symbicort twice a day Follow with Dr Elsworth Soho in 2-3 weeks to assess your  progress

## 2014-02-21 NOTE — Assessment & Plan Note (Signed)
With an acute flare that appears to be due to a URI.    Please take prednisone as directed - Take 40mg  daily for 3 days, then 30mg  daily for 3 days, then 20mg  daily for 3 days, then 10mg  daily for 3 days, then stop Take doxycycline 100mg  twice a day for 7 days as directed Continue your Symbicort twice a day Follow with Dr Elsworth Soho in 2-3 weeks to assess your progress

## 2014-02-21 NOTE — Addendum Note (Signed)
Addended by: Raymondo Band D on: 02/21/2014 03:58 PM   Modules accepted: Orders

## 2014-03-03 ENCOUNTER — Encounter (HOSPITAL_COMMUNITY): Payer: Self-pay | Admitting: Emergency Medicine

## 2014-03-03 ENCOUNTER — Emergency Department (HOSPITAL_COMMUNITY): Payer: Medicare Other

## 2014-03-03 ENCOUNTER — Emergency Department (HOSPITAL_COMMUNITY)
Admission: EM | Admit: 2014-03-03 | Discharge: 2014-03-03 | Disposition: A | Discharge status: Home / Self Care | Payer: Medicare Other | Attending: Emergency Medicine | Admitting: Emergency Medicine

## 2014-03-03 DIAGNOSIS — J441 Chronic obstructive pulmonary disease with (acute) exacerbation: Secondary | ICD-10-CM | POA: Diagnosis not present

## 2014-03-03 DIAGNOSIS — R0602 Shortness of breath: Secondary | ICD-10-CM | POA: Diagnosis present

## 2014-03-03 DIAGNOSIS — IMO0002 Reserved for concepts with insufficient information to code with codable children: Secondary | ICD-10-CM | POA: Insufficient documentation

## 2014-03-03 DIAGNOSIS — Z79899 Other long term (current) drug therapy: Secondary | ICD-10-CM | POA: Insufficient documentation

## 2014-03-03 DIAGNOSIS — F17200 Nicotine dependence, unspecified, uncomplicated: Secondary | ICD-10-CM | POA: Insufficient documentation

## 2014-03-03 DIAGNOSIS — I10 Essential (primary) hypertension: Secondary | ICD-10-CM | POA: Insufficient documentation

## 2014-03-03 DIAGNOSIS — J45901 Unspecified asthma with (acute) exacerbation: Principal | ICD-10-CM

## 2014-03-03 LAB — BASIC METABOLIC PANEL
Anion gap: 15 (ref 5–15)
BUN: 21 mg/dL (ref 6–23)
CALCIUM: 9.5 mg/dL (ref 8.4–10.5)
CHLORIDE: 99 meq/L (ref 96–112)
CO2: 27 mEq/L (ref 19–32)
Creatinine, Ser: 0.56 mg/dL (ref 0.50–1.10)
GFR calc non Af Amer: >90 mL/min (ref >90)
Glucose, Bld: 132 mg/dL — ABNORMAL HIGH (ref 70–99)
Potassium: 3.8 mEq/L (ref 3.7–5.3)
Sodium: 141 mEq/L (ref 137–147)

## 2014-03-03 LAB — CBC
HCT: 45.3 % (ref 36.0–46.0)
Hemoglobin: 14.4 g/dL (ref 12.0–15.0)
MCH: 29.8 pg (ref 26.0–34.0)
MCHC: 31.8 g/dL (ref 30.0–36.0)
MCV: 93.8 fL (ref 78.0–100.0)
PLATELETS: 261 10*3/uL (ref 150–400)
RBC: 4.83 MIL/uL (ref 3.87–5.11)
RDW: 14.6 % (ref 11.5–15.5)
WBC: 18.5 10*3/uL — AB (ref 4.0–10.5)

## 2014-03-03 LAB — I-STAT TROPONIN, ED: Troponin i, poc: 0.01 ng/mL (ref 0.00–0.08)

## 2014-03-03 LAB — PRO B NATRIURETIC PEPTIDE: Pro B Natriuretic peptide (BNP): 175.1 pg/mL — ABNORMAL HIGH (ref 0–125)

## 2014-03-03 MED ORDER — ALBUTEROL SULFATE HFA 108 (90 BASE) MCG/ACT IN AERS: 2.0000 | INHALATION_SPRAY | RESPIRATORY_TRACT | Status: DC | PRN

## 2014-03-03 MED ORDER — ACETAMINOPHEN 325 MG PO TABS
650.0000 mg | ORAL_TABLET | Freq: Once | ORAL | Status: AC
  Administered 2014-03-03: 650 mg via ORAL
  Filled 2014-03-03: qty 2

## 2014-03-03 MED ORDER — ALBUTEROL SULFATE (2.5 MG/3ML) 0.083% IN NEBU
5.0000 mg | INHALATION_SOLUTION | Freq: Once | RESPIRATORY_TRACT | Status: AC
  Administered 2014-03-03: 5 mg via RESPIRATORY_TRACT
  Filled 2014-03-03: qty 6

## 2014-03-03 MED ORDER — IPRATROPIUM BROMIDE 0.02 % IN SOLN
0.5000 mg | Freq: Once | RESPIRATORY_TRACT | Status: AC
  Administered 2014-03-03: 0.5 mg via RESPIRATORY_TRACT
  Filled 2014-03-03: qty 2.5

## 2014-03-03 NOTE — Discharge Instructions (Signed)
It was a pleasure providing your care today.  We hope that you feel better.  Use your symbicort inhaler as prescribed, 2x/day. Use the albuterol inhaler every 4-6 hours as need.  It is the albuterol inhaler that you may use for wheezing, and as rescue inhaler (not the symbicort).   Complete the course of prednisone as prescribed by your doctor.  No smoking. Follow up with your doctor/lung specialist in the coming week.  Return to ER if worse, new symptoms, fevers, chest pain, worsening breathing, other concern.    Chronic Obstructive Pulmonary Disease Chronic obstructive pulmonary disease (COPD) is a common lung condition in which airflow from the lungs is limited. COPD is a general term that can be used to describe many different lung problems that limit airflow, including both chronic bronchitis and emphysema. If you have COPD, your lung function will probably never return to normal, but there are measures you can take to improve lung function and make yourself feel better.  CAUSES   Smoking (common).   Exposure to secondhand smoke.   Genetic problems.  Chronic inflammatory lung diseases or recurrent infections. SYMPTOMS   Shortness of breath, especially with physical activity.   Deep, persistent (chronic) cough with a large amount of thick mucus.   Wheezing.   Rapid breaths (tachypnea).   Gray or bluish discoloration (cyanosis) of the skin, especially in fingers, toes, or lips.   Fatigue.   Weight loss.   Frequent infections or episodes when breathing symptoms become much worse (exacerbations).   Chest tightness. DIAGNOSIS  Your health care provider will take a medical history and perform a physical examination to make the initial diagnosis. Additional tests for COPD may include:   Lung (pulmonary) function tests.  Chest X-ray.  CT scan.  Blood tests. TREATMENT  Treatment available to help you feel better when you have COPD includes:   Inhaler and  nebulizer medicines. These help manage the symptoms of COPD and make your breathing more comfortable.  Supplemental oxygen. Supplemental oxygen is only helpful if you have a low oxygen level in your blood.   Exercise and physical activity. These are beneficial for nearly all people with COPD. Some people may also benefit from a pulmonary rehabilitation program. HOME CARE INSTRUCTIONS   Take all medicines (inhaled or pills) as directed by your health care provider.  Avoid over-the-counter medicines or cough syrups that dry up your airway (such as antihistamines) and slow down the elimination of secretions unless instructed otherwise by your health care provider.   If you are a smoker, the most important thing that you can do is stop smoking. Continuing to smoke will cause further lung damage and breathing trouble. Ask your health care provider for help with quitting smoking. He or she can direct you to community resources or hospitals that provide support.  Avoid exposure to irritants such as smoke, chemicals, and fumes that aggravate your breathing.  Use oxygen therapy and pulmonary rehabilitation if directed by your health care provider. If you require home oxygen therapy, ask your health care provider whether you should purchase a pulse oximeter to measure your oxygen level at home.   Avoid contact with individuals who have a contagious illness.  Avoid extreme temperature and humidity changes.  Eat healthy foods. Eating smaller, more frequent meals and resting before meals may help you maintain your strength.  Stay active, but balance activity with periods of rest. Exercise and physical activity will help you maintain your ability to do things you want  to do.  Preventing infection and hospitalization is very important when you have COPD. Make sure to receive all the vaccines your health care provider recommends, especially the pneumococcal and influenza vaccines. Ask your health care  provider whether you need a pneumonia vaccine.  Learn and use relaxation techniques to manage stress.  Learn and use controlled breathing techniques as directed by your health care provider. Controlled breathing techniques include:   Pursed lip breathing. Start by breathing in (inhaling) through your nose for 1 second. Then, purse your lips as if you were going to whistle and breathe out (exhale) through the pursed lips for 2 seconds.   Diaphragmatic breathing. Start by putting one hand on your abdomen just above your waist. Inhale slowly through your nose. The hand on your abdomen should move out. Then purse your lips and exhale slowly. You should be able to feel the hand on your abdomen moving in as you exhale.   Learn and use controlled coughing to clear mucus from your lungs. Controlled coughing is a series of short, progressive coughs. The steps of controlled coughing are:  1. Lean your head slightly forward.  2. Breathe in deeply using diaphragmatic breathing.  3. Try to hold your breath for 3 seconds.  4. Keep your mouth slightly open while coughing twice.  5. Spit any mucus out into a tissue.  6. Rest and repeat the steps once or twice as needed. SEEK MEDICAL CARE IF:   You are coughing up more mucus than usual.   There is a change in the color or thickness of your mucus.   Your breathing is more labored than usual.   Your breathing is faster than usual.  SEEK IMMEDIATE MEDICAL CARE IF:   You have shortness of breath while you are resting.   You have shortness of breath that prevents you from:  Being able to talk.   Performing your usual physical activities.   You have chest pain lasting longer than 5 minutes.   Your skin color is more cyanotic than usual.  You measure low oxygen saturations for longer than 5 minutes with a pulse oximeter. MAKE SURE YOU:   Understand these instructions.  Will watch your condition.  Will get help right away if  you are not doing well or get worse. Document Released: 03/10/2005 Document Revised: 10/15/2013 Document Reviewed: 01/25/2013 [facility] Patient Information 2015 [place], [place]. This information is not intended to replace advice given to you by your health care provider. Make sure you discuss any questions you have with your health care provider.   Smoking Hazards Smoking cigarettes is extremely bad for your health. Tobacco smoke has over 200 known poisons in it. It contains the poisonous gases nitrogen oxide and carbon monoxide. There are over 60 chemicals in tobacco smoke that cause cancer. Some of the chemicals found in cigarette smoke include:   Cyanide.   Benzene.   Formaldehyde.   Methanol (wood alcohol).   Acetylene (fuel used in welding torches).   Ammonia.  Even smoking lightly shortens your life expectancy by several years. You can greatly reduce the risk of medical problems for you and your family by stopping now. Smoking is the most preventable cause of death and disease in our society. Within days of quitting smoking, your circulation improves, you decrease the risk of having a heart attack, and your lung capacity improves. There may be some increased phlegm in the first few days after quitting, and it may take months for your lungs to  clear up completely. Quitting for 10 years reduces your risk of developing lung cancer to almost that of a nonsmoker.  WHAT ARE THE RISKS OF SMOKING? Cigarette smokers have an increased risk of many serious medical problems, including:  Lung cancer.   Lung disease (such as pneumonia, bronchitis, and emphysema).   Heart attack and chest pain due to the heart not getting enough oxygen (angina).   Heart disease and peripheral blood vessel disease.   Hypertension.   Stroke.   Oral cancer (cancer of the lip, mouth, or voice box).   Bladder cancer.   Pancreatic cancer.   Cervical cancer.   Pregnancy complications,  including premature birth.   Stillbirths and smaller newborn babies, birth defects, and genetic damage to sperm.   Early menopause.   Lower estrogen level for women.   Infertility.   Facial wrinkles.   Blindness.   Increased risk of broken bones (fractures).   Senile dementia.   Stomach ulcers and internal bleeding.   Delayed wound healing and increased risk of complications during surgery. Because of secondhand smoke exposure, children of smokers have an increased risk of the following:   Sudden infant death syndrome (SIDS).   Respiratory infections.   Lung cancer.   Heart disease.   Ear infections.  WHY IS SMOKING ADDICTIVE? Nicotine is the chemical agent in tobacco that is capable of causing addiction or dependence. When you smoke and inhale, nicotine is absorbed rapidly into the bloodstream through your lungs. Both inhaled and noninhaled nicotine may be addictive.  WHAT ARE THE BENEFITS OF QUITTING?  There are many health benefits to quitting smoking. Some are:   The likelihood of developing cancer and heart disease decreases. Health improvements are seen almost immediately.   Blood pressure, pulse rate, and breathing patterns start returning to normal soon after quitting.   People who quit may see an improvement in their overall quality of life.  HOW DO YOU QUIT SMOKING? Smoking is an addiction with both physical and psychological effects, and longtime habits can be hard to change. Your health care provider can recommend:  Programs and community resources, which may include group support, education, or therapy.  Replacement products, such as patches, gum, and nasal sprays. Use these products only as directed. Do not replace cigarette smoking with electronic cigarettes (commonly called e-cigarettes). The safety of e-cigarettes is unknown, and some may contain harmful chemicals. FOR MORE INFORMATION  American Lung Association:  www.lung.org  American Cancer Society: www.cancer.org Document Released: 07/08/2004 Document Revised: 03/21/2013 Document Reviewed: 11/20/2012 [facility] Patient Information 2015 [place], [place]. This information is not intended to replace advice given to you by your health care provider. Make sure you discuss any questions you have with your health care provider.

## 2014-03-03 NOTE — ED Provider Notes (Addendum)
CSN: [id number]     Arrival date & time 03/03/14  1210 History   First MD Initiated Contact with Patient 03/03/14 1343     Chief Complaint  Patient presents with  . Shortness of Breath     (Consider location/radiation/quality/duration/timing/severity/associated sxs/prior Treatment) Patient is a 68 y.o. female presenting with shortness of breath. The history is provided by the patient.  Shortness of Breath Associated symptoms: cough and wheezing   Associated symptoms: no abdominal pain, no chest pain, no fever, no neck pain, no rash and no vomiting   pt with hx copd, c/o sob in the past couple weeks.  Moderate. Denies specific exacerbating or alleviating factors.  State had recent non prod cough and just completed rx doxycycline.  Still taking prednisone taper - states 'has 6 left'.   Mildly scratchy throat, no trouble swallowing. No fever or chills. Denies current or recent cp or discomfort. No leg swelling. No orthopnea or pnd. States compliant w normal meds, although states uses symbicort bid and rarely uses albuterol. +smoker.    Past Medical History  Diagnosis Date  . Tobacco abuse   . Venous insufficiency   . Chronic airflow obstruction   . Hypertension   . Asthma    Past Surgical History  Procedure Laterality Date  . Vesicovaginal fistula closure w/ tah    . Neck surgery    . Cataract     Family History  Problem Relation Age of Onset  . Emphysema Father   . Asthma Father   . Heart disease Father   . Diabetes Mother    History  Substance Use Topics  . Smoking status: Current Every Day Smoker -- 0.75 packs/day for 15 years    Types: Cigarettes  . Smokeless tobacco: Not on file     Comment: smokes about 4 cigarettes daily//11.28.14  . Alcohol Use: No   OB History   Grav Para Term Preterm Abortions TAB SAB Ect Mult Living                 Review of Systems  Constitutional: Negative for fever and chills.  HENT: Negative for trouble swallowing.   Eyes: Negative  for redness.  Respiratory: Positive for cough, shortness of breath and wheezing.   Cardiovascular: Negative for chest pain, palpitations and leg swelling.  Gastrointestinal: Negative for vomiting, abdominal pain and diarrhea.  Genitourinary: Negative for flank pain.  Musculoskeletal: Negative for back pain and neck pain.  Skin: Negative for rash.  Neurological: Negative for weakness and numbness.  Hematological: Does not bruise/bleed easily.  Psychiatric/Behavioral: Negative for confusion.      Allergies  Review of patient's allergies indicates no known allergies.  Home Medications   Prior to Admission medications   Medication Sig Start Date End Date Taking? Authorizing Provider  albuterol (PROVENTIL HFA;VENTOLIN HFA) 108 (90 BASE) MCG/ACT inhaler Inhale 2 puffs into the lungs every 6 (six) hours as needed for wheezing or shortness of breath. 09/26/13   Hendricks Limes, MD  budesonide-formoterol [facility]) 80-4.5 MCG/ACT inhaler Inhale 2 puffs into the lungs 2 (two) times daily. 11/14/13   Rigoberto Noel, MD  doxycycline (VIBRA-TABS) 100 MG tablet Take 1 tablet (100 mg total) by mouth 2 (two) times daily. 02/21/14   Collene Gobble, MD  predniSONE (DELTASONE) 10 MG tablet Take 40mg  daily for 3 days, then 30mg  daily for 3 days, then 20mg  daily for 3 days, then 10mg  daily for 3 days, then stop 02/21/14   Collene Gobble, MD  BP 165/65  Pulse 65  Temp(Src) 97.9 F (36.6 C) (Oral)  Resp 15  Ht 5\' 7"  (1.702 m)  Wt 126 lb (57.153 kg)  BMI 19.73 kg/m2  SpO2 97% Physical Exam  Nursing note and vitals reviewed. Constitutional: She is oriented to person, place, and time. She appears well-developed and well-nourished. No distress.  HENT:  Nose: Nose normal.  Mouth/Throat: Oropharynx is clear and moist.  No sinus or temporal tenderness.   Eyes: Conjunctivae are normal. No scleral icterus.  Neck: Normal range of motion. Neck supple. No tracheal deviation present.  No stiffness or rigidity   Cardiovascular: Normal rate, regular rhythm, normal heart sounds and intact distal pulses.   Pulmonary/Chest: Effort normal. No respiratory distress. She has wheezes.  Abdominal: Soft. Normal appearance and bowel sounds are normal. She exhibits no distension. There is no tenderness.  Genitourinary:  No cva tenderness  Musculoskeletal: She exhibits no edema and no tenderness.  Neurological: She is alert and oriented to person, place, and time.  Motor intact bil. Steady gait.   Skin: Skin is warm and dry. No rash noted. She is not diaphoretic.  Psychiatric: She has a normal mood and affect.    ED Course  Procedures (including critical care time) Labs Review Results for orders placed during the hospital encounter of 03/03/14  CBC      Result Value Ref Range   WBC 18.5 (*) 4.0 - 10.5 K/uL   RBC 4.83  3.87 - 5.11 MIL/uL   Hemoglobin 14.4  12.0 - 15.0 g/dL   HCT 45.3  36.0 - 46.0 %   MCV 93.8  78.0 - 100.0 fL   MCH 29.8  26.0 - 34.0 pg   MCHC 31.8  30.0 - 36.0 g/dL   RDW 14.6  11.5 - 15.5 %   Platelets 261  150 - 400 K/uL  BASIC METABOLIC PANEL      Result Value Ref Range   Sodium 141  137 - 147 mEq/L   Potassium 3.8  3.7 - 5.3 mEq/L   Chloride 99  96 - 112 mEq/L   CO2 27  19 - 32 mEq/L   Glucose, Bld 132 (*) 70 - 99 mg/dL   BUN 21  6 - 23 mg/dL   Creatinine, Ser 0.56  0.50 - 1.10 mg/dL   Calcium 9.5  8.4 - 10.5 mg/dL   GFR calc non Af Amer >90  >90 mL/min   GFR calc Af Amer >90  >90 mL/min   Anion gap 15  5 - 15  PRO B NATRIURETIC PEPTIDE      Result Value Ref Range   Pro B Natriuretic peptide (BNP) 175.1 (*) 0 - 125 pg/mL  I-STAT TROPOININ, ED      Result Value Ref Range   Troponin i, poc 0.01  0.00 - 0.08 ng/mL   Comment 3            Dg Chest 2 View  03/03/2014   CLINICAL DATA:  Shortness of breath.  History of COPD.  EXAM: CHEST  2 VIEW  COMPARISON:  06/22/2011.  FINDINGS: The heart remains normal in size. The lungs remain clear and hyperexpanded with mildly prominent  interstitial markings. Mild thoracic spine degenerative changes. Diffuse osteopenia.  IMPRESSION: No acute abnormality.  Stable changes of COPD.   Electronically Signed   By: Enrique Sack M.D.   On: 03/03/2014 14:04     Imaging Review Dg Chest 2 View  03/03/2014   CLINICAL DATA:  Shortness  of breath.  History of COPD.  EXAM: CHEST  2 VIEW  COMPARISON:  06/22/2011.  FINDINGS: The heart remains normal in size. The lungs remain clear and hyperexpanded with mildly prominent interstitial markings. Mild thoracic spine degenerative changes. Diffuse osteopenia.  IMPRESSION: No acute abnormality.  Stable changes of COPD.   Electronically Signed   By: Enrique Sack M.D.   On: 03/03/2014 14:04     EKG Interpretation   Date/Time:  Sunday March 03 2014 12:20:11 EDT Ventricular Rate:  70 PR Interval:  132 QRS Duration: 88 QT Interval:  410 QTC Calculation: 442 R Axis:   -21 Text Interpretation:  Normal sinus rhythm Left axis deviation Nonspecific  T wave abnormality No significant change since last tracing Confirmed by  Kayana Thoen  MD, Lennette Bihari ([id number]) on 03/03/2014 2:09:07 PM      MDM  Albuterol and atrovent neb.  Cxr.  Reviewed nursing notes and prior charts for additional history.   Pt appears to have some confusion regarding her inhaler use.  Was asking if she could use her symbicort more often that 2x/day if she needs/has wheezing, and also states that she rarely uses her albuterol, or uses 1x/day.   Instructed pt to use her symbicort bid/every day, and to use albuterol mdi every 4-6 hrs as need. Also smoking cessation.    Pt also indicates she has 6 pred tablets left - is instructed to completed pred course as prescribed by her doctor.  Wheezing resolved w single alb/atr neb - as such, do not feel needs new rx for pred today.   Discussed pcp/pulm follow up.  Pt currently appears medically stable for d/c.       Mirna Mires, MD 03/03/14 [phone]

## 2014-03-03 NOTE — ED Notes (Signed)
Pt reports having hx of copd and increase in sob x 1 week. Recently went to pcp for chest congestion and was started on steriods and antibiotics but reports no relief. Denies swelling to extremities. ekg done and spo2 97% at triage.

## 2014-03-07 ENCOUNTER — Ambulatory Visit: Payer: Medicare Other | Admitting: Adult Health

## 2014-03-11 ENCOUNTER — Encounter: Payer: Self-pay | Admitting: Adult Health

## 2014-03-11 ENCOUNTER — Ambulatory Visit (INDEPENDENT_AMBULATORY_CARE_PROVIDER_SITE_OTHER): Payer: Medicare Other | Admitting: Adult Health

## 2014-03-11 VITALS — BP 120/60 | HR 84 | Temp 98.2°F | Ht 67.0 in | Wt 127.4 lb

## 2014-03-11 DIAGNOSIS — Z23 Encounter for immunization: Secondary | ICD-10-CM

## 2014-03-11 DIAGNOSIS — J449 Chronic obstructive pulmonary disease, unspecified: Secondary | ICD-10-CM

## 2014-03-11 MED ORDER — TIOTROPIUM BROMIDE MONOHYDRATE 2.5 MCG/ACT IN AERS: 2.0000 | INHALATION_SPRAY | Freq: Every day | RESPIRATORY_TRACT | Status: DC

## 2014-03-11 NOTE — Progress Notes (Signed)
   Subjective:    Patient ID: Diane Patterson, female    DOB: April 30, 1946, 68 y.o.   MRN: [id number]  HPI 67/F, 2 pack per day smoker for FU of Gold stg IV COPD  FEV1 was 27% in 12/09. CXR 12/09 hyperinflation  ABout 1 flare/ year needing steroids   03/11/2014 Follow up [facility]  Returns for follow up.  Returns for  3 week follow up .  Reports breathing is near baseline.   No new complaints Was seen last ov with COPD flare , Tx with doxycycline and pred taper . Was seen in ER on 9/20 with flare given neb tx.  CXR w/ no acute findings.  She is feeling better and is back to her baseline. Continues to smoke , advised on smoking cessation   No chest pain, orthopnea, edema or hemoptysis.       Review of Systems Constitutional:   No  weight loss, night sweats,  Fevers, chills, fatigue, or  lassitude.  HEENT:   No headaches,  Difficulty swallowing,  Tooth/dental problems, or  Sore throat,                No sneezing, itching, ear ache,    CV:  No chest pain,  Orthopnea, PND, swelling in lower extremities, anasarca, dizziness, palpitations, syncope.   GI  No heartburn, indigestion, abdominal pain, nausea, vomiting, diarrhea, change in bowel habits, loss of appetite, bloody stools.   Resp:  No chest wall deformity  Skin: no rash or lesions.  GU: no dysuria, change in color of urine, no urgency or frequency.  No flank pain, no hematuria   MS:  No joint pain or swelling.  No decreased range of motion.  No back pain.  Psych:  No change in mood or affect. No depression or anxiety.  No memory loss.          Objective:   Physical Exam GEN: A/Ox3; pleasant , NAD, thin   HEENT:  [place]/AT,  EACs-clear, TMs-wnl, NOSE-clear, THROAT-clear, no lesions, no postnasal drip or exudate noted.   NECK:  Supple w/ fair ROM; no JVD; normal carotid impulses w/o bruits; no thyromegaly or nodules palpated; no lymphadenopathy.  RESP  Few tr rhonchi no accessory muscle use, no dullness to  percussion  CARD:  RRR, no m/r/g  , no peripheral edema, pulses intact, no cyanosis or clubbing.  GI:   Soft & nt; nml bowel sounds; no organomegaly or masses detected.  Musco: Warm bil, no deformities or joint swelling noted.   Neuro: alert, no focal deficits noted.    Skin: Warm, no lesions or rashes         Assessment & Plan:

## 2014-03-11 NOTE — Progress Notes (Signed)
Reviewed & agree with plan  

## 2014-03-11 NOTE — Addendum Note (Signed)
Addended by: Parke Poisson E on: 03/11/2014 11:56 AM   Modules accepted: Orders

## 2014-03-11 NOTE — Patient Instructions (Addendum)
Flu shot  Continue on Symbicort 2 puffs Twice daily  ,rinse after use.  Begin Spiriva Respimat 2 puffs daily  Continue on current regimen.  Most important goal is to  Quit smoking  Follow up Dr. Elsworth Soho  In 6 weeks and As needed   Please contact office for sooner follow up if symptoms do not improve or worsen or seek emergency care

## 2014-03-11 NOTE — Assessment & Plan Note (Signed)
Recent Flare now resolving   Plan:  Flu shot  Continue on Symbicort 2 puffs Twice daily   Begin Spiriva Respimat 2 puffs daily  Continue on current regimen.  Most important goal is to  Quit smoking  Follow up Dr. Elsworth Soho  In 6 weeks and As needed

## 2014-03-18 ENCOUNTER — Encounter: Payer: Self-pay | Admitting: Gastroenterology

## 2014-03-19 ENCOUNTER — Other Ambulatory Visit (INDEPENDENT_AMBULATORY_CARE_PROVIDER_SITE_OTHER): Payer: Medicare Other

## 2014-03-19 ENCOUNTER — Encounter: Payer: Self-pay | Admitting: Internal Medicine

## 2014-03-19 ENCOUNTER — Ambulatory Visit (INDEPENDENT_AMBULATORY_CARE_PROVIDER_SITE_OTHER): Payer: Medicare Other | Admitting: Internal Medicine

## 2014-03-19 VITALS — BP 140/90 | HR 76 | Temp 97.9°F | Resp 15 | Wt 123.0 lb

## 2014-03-19 DIAGNOSIS — D72829 Elevated white blood cell count, unspecified: Secondary | ICD-10-CM

## 2014-03-19 DIAGNOSIS — K5900 Constipation, unspecified: Secondary | ICD-10-CM

## 2014-03-19 DIAGNOSIS — R1084 Generalized abdominal pain: Secondary | ICD-10-CM

## 2014-03-19 DIAGNOSIS — R739 Hyperglycemia, unspecified: Secondary | ICD-10-CM

## 2014-03-19 LAB — CBC WITH DIFFERENTIAL/PLATELET
BASOS PCT: 0.9 % (ref 0.0–3.0)
Basophils Absolute: 0.1 10*3/uL (ref 0.0–0.1)
Eosinophils Absolute: 0.4 10*3/uL (ref 0.0–0.7)
Eosinophils Relative: 4.4 % (ref 0.0–5.0)
HEMATOCRIT: 41.8 % (ref 36.0–46.0)
Hemoglobin: 13.6 g/dL (ref 12.0–15.0)
LYMPHS ABS: 2.2 10*3/uL (ref 0.7–4.0)
LYMPHS PCT: 23.7 % (ref 12.0–46.0)
MCHC: 32.4 g/dL (ref 30.0–36.0)
MCV: 91.1 fl (ref 78.0–100.0)
MONOS PCT: 7 % (ref 3.0–12.0)
Monocytes Absolute: 0.6 10*3/uL (ref 0.1–1.0)
NEUTROS ABS: 5.9 10*3/uL (ref 1.4–7.7)
Neutrophils Relative %: 64 % (ref 43.0–77.0)
Platelets: 363 10*3/uL (ref 150.0–400.0)
RBC: 4.59 Mil/uL (ref 3.87–5.11)
RDW: 14.4 % (ref 11.5–15.5)
WBC: 9.3 10*3/uL (ref 4.0–10.5)

## 2014-03-19 LAB — HEMOGLOBIN A1C: Hgb A1c MFr Bld: 6.4 % (ref 4.6–6.5)

## 2014-03-19 NOTE — Progress Notes (Signed)
Pre visit review using our clinic review tool, if applicable. No additional management support is needed unless otherwise documented below in the visit note. 

## 2014-03-19 NOTE — Patient Instructions (Signed)
Take the Prilosec OTC (protein pump inhibitor) 30 minutes before breakfast and 30 minutes before the evening meal for 8 weeks then go back to once a day  30 minutes before breakfast. Reflux of gastric acid may be asymptomatic as this may occur mainly during sleep.The triggers for reflux  include stress; the "aspirin family" ; alcohol; peppermint; and caffeine (coffee, tea, cola, and chocolate). The aspirin family would include aspirin and the nonsteroidal agents such as ibuprofen &  Naproxen. Tylenol would not cause reflux. If having symptoms ; food & drink should be avoided for @ least 2 hours before going to bed.  Natural interventions to treat or prevent constipation would include drinking to thirst, up to 32 ounces of fluids daily; eating 7-9 servings of fresh fruits or vegetables a day; and increasing roughage in the diet such as whole grains. The OTC  fiber products (Example: Metamucil, etc) can be employed if these natural maneuvers do not correct the issue. Finally MiraLax every third day as needed is an option.

## 2014-03-19 NOTE — Progress Notes (Signed)
   Subjective:    Patient ID: Diane Patterson, female    DOB: 1945/08/17, 68 y.o.   MRN: [id number]  HPI   She has had abdominal pain in the context of constipation for 2 months. The discomfort is described as sharp and in the mid abdominal area. It does not radiate ;it can last hours  It is better if she rests. Also Prilosec did seem to help it. Spicy foods aggravate the symptoms  She's had associated loss of appetite and a 4 pound weight loss.  She drinks 2 cups of coffee a day and one cola. She does not drink alcohol.  Her mother may have had ulcers.  She has never had a colonoscopy stating that she is "afraid".      Review of Systems   She does not have nausea, vomiting, diarrhea, melena, rectal bleeding, or persistently small caliber stools.  She also is not having dysphagia, significant dyspepsia, hematemesis, fever, or sweats. She does have some chills  She has no genitourinary symptoms of dysuria, pyuria, or hematuria.  There is no associated pain radiating from the back anteriorly  She was in the emergency room 03/03/14 with respiratory compromise. At that time her white count was 18,500, hemoglobin14.4 & hematocrit 45.3. Her glucose was 132.     Objective:   Physical Exam   Positive or pertinent findings include: She appears chronically ill. Facies are very weathered in appearance. She exhibits an S4 without murmurs or gallops Breath sounds are markedly decreased in all lung fields There is tenting of the skin. There is plethora of the fingers which blanch with pressure She is tremulous and anxious in appearance.  General appearance : in no distress. Eyes: No conjunctival inflammation or scleral icterus is present. Oral exam: Dental hygiene is fair - good. Lips and gums are healthy appearing.There is no oropharyngeal erythema or exudate noted.  Heart:  Normal rate and regular rhythm. S1 and S2 normal  Lungs:Chest clear to auscultation; no wheezes,  rhonchi,rales ,or rubs present.No increased work of breathing.  Abdomen: bowel sounds normal, soft and non-tender without masses, organomegaly or hernias noted.  No guarding or rebound. No flank tenderness to percussion. Vascular : all pulses equal ; no bruits present. Skin:Warm & dry.  Intact without suspicious lesions or rashes ; no jaundice Lymphatic: No lymphadenopathy is noted about the head, neck, axilla            Assessment & Plan:  #1 abdominal pain  #2 constipation  #3 weight loss  #4 leukocytosis  #5 hyperglycemia  Plan: See orders and recommendations

## 2014-03-20 LAB — TSH: TSH: 2.36 u[IU]/mL (ref 0.35–4.50)

## 2014-03-20 LAB — HEPATIC FUNCTION PANEL
ALBUMIN: 3.6 g/dL (ref 3.5–5.2)
ALK PHOS: 100 U/L (ref 39–117)
ALT: 17 U/L (ref 0–35)
AST: 15 U/L (ref 0–37)
BILIRUBIN DIRECT: 0.1 mg/dL (ref 0.0–0.3)
TOTAL PROTEIN: 7.3 g/dL (ref 6.0–8.3)
Total Bilirubin: 0.2 mg/dL (ref 0.2–1.2)

## 2014-03-20 LAB — AMYLASE: Amylase: 68 U/L (ref 27–131)

## 2014-03-20 LAB — LIPASE: Lipase: 30 U/L (ref 11.0–59.0)

## 2014-04-22 ENCOUNTER — Telehealth: Payer: Self-pay | Admitting: Pulmonary Disease

## 2014-04-22 MED ORDER — OSELTAMIVIR PHOSPHATE 75 MG PO CAPS: 75.0000 mg | ORAL_CAPSULE | Freq: Two times a day (BID) | ORAL | Status: DC

## 2014-04-22 MED ORDER — AZITHROMYCIN 250 MG PO TABS: ORAL_TABLET | ORAL | Status: DC

## 2014-04-22 NOTE — Telephone Encounter (Signed)
Spoke with the pt  She is c/o congestion, HA, chills, cough with yellow sputum-onset 4 days ago  No fever "but my face is red" Spouse with same symptoms  She fears that she has the flu and wants something called in  Please advise thanks! No Known Allergies

## 2014-04-22 NOTE — Telephone Encounter (Signed)
Tamiflu 75 bid x 5 days Z-pak [place] of oral fluids Call for appt if worse

## 2014-04-22 NOTE — Telephone Encounter (Signed)
Pt aware of recs. RX sent in. Nothing further needed 

## 2014-04-26 ENCOUNTER — Telehealth: Payer: Self-pay | Admitting: Pulmonary Disease

## 2014-04-26 MED ORDER — PREDNISONE (PAK) 5 MG PO TABS: 5.0000 mg | ORAL_TABLET | ORAL | Status: DC

## 2014-04-26 NOTE — Telephone Encounter (Signed)
Per SN---if no better on this then send in rx for pred dosepak  5 mg  6 day pack.  otc mucinex 600 mg  1 po qid with increase in fluids.  thanks

## 2014-04-26 NOTE — Telephone Encounter (Signed)
Spoke with pt. Aware of rec's per SN Rx sent to [facility] Nothing further needed.

## 2014-04-26 NOTE — Telephone Encounter (Signed)
Called and spoke to pt. Pt stated she and her husband still are not better after taking the tamiflu and zpak. Pt states she is still having the congestion and cough, no improvement. Appt made with RA on Monday 11/16 at 1:30. Pt requesting recs to last over the weekend.   Please advise.   Rigoberto Noel, MD at 04/22/2014 5:04 PM     Status: Signed       Expand All Collapse All   Tamiflu 75 bid x 5 days Z-pak [place] of oral fluids Call for appt if worse            Rosana Berger, CMA at 04/22/2014 12:05 PM     Status: Signed       Expand All Collapse All   Spoke with the pt  She is c/o congestion, HA, chills, cough with yellow sputum-onset 4 days ago  No fever "but my face is red" Spouse with same symptoms  She fears that she has the flu and wants something called in  Please advise thanks! No Known Allergies        No Known Allergies   Current Outpatient Prescriptions on File Prior to Visit  Medication Sig Dispense Refill  . albuterol (PROVENTIL HFA;VENTOLIN HFA) 108 (90 BASE) MCG/ACT inhaler Inhale 2 puffs into the lungs every 4 (four) hours as needed for wheezing or shortness of breath. 1 Inhaler 3  . azithromycin (ZITHROMAX) 250 MG tablet Take as directed 6 tablet 0  . budesonide-formoterol (SYMBICORT) 80-4.5 MCG/ACT inhaler Inhale 2 puffs into the lungs 2 (two) times daily. 10.2 g 6  . diltiazem (CARDIZEM SR) 90 MG 12 hr capsule Take 90 mg by mouth daily.    . fluorouracil (EFUDEX) 5 % cream Apply 1 application topically daily.    Marland Kitchen oseltamivir (TAMIFLU) 75 MG capsule Take 1 capsule (75 mg total) by mouth 2 (two) times daily. 10 capsule 0  . Tiotropium Bromide Monohydrate (SPIRIVA RESPIMAT) 2.5 MCG/ACT AERS Inhale 2 puffs into the lungs daily. 4 g 5   No current facility-administered medications on file prior to visit.

## 2014-04-29 ENCOUNTER — Ambulatory Visit (INDEPENDENT_AMBULATORY_CARE_PROVIDER_SITE_OTHER): Payer: Medicare Other | Admitting: Pulmonary Disease

## 2014-04-29 ENCOUNTER — Encounter: Payer: Self-pay | Admitting: Pulmonary Disease

## 2014-04-29 VITALS — BP 130/80 | HR 69 | Temp 98.0°F | Ht 68.0 in | Wt 135.0 lb

## 2014-04-29 DIAGNOSIS — J449 Chronic obstructive pulmonary disease, unspecified: Secondary | ICD-10-CM

## 2014-04-29 MED ORDER — BUDESONIDE-FORMOTEROL FUMARATE 80-4.5 MCG/ACT IN AERO: 2.0000 | INHALATION_SPRAY | Freq: Two times a day (BID) | RESPIRATORY_TRACT | Status: DC

## 2014-04-29 NOTE — Progress Notes (Signed)
   Subjective:    Patient ID: Diane Patterson, female    DOB: July 24, 1945, 68 y.o.   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 ABout 1 flare/ year needing steroids    04/29/2014  Chief Complaint  Patient presents with  . Acute Visit    cold symptoms, f/u copd, needs rx for symbicort   02/2014 doxycycline and pred taper . Was seen in ER on 9/20 with flare given neb tx.  CXR clear She quit smoking 6 wks ago Both she & husband got sick with URI foll by cough, green phlegm Given z-pak, tamiflu with some improvement COlor changed to white but sticky mucus No wheeze, I saw husband last week  He is improving    Review of Systems neg for any significant sore throat, dysphagia, itching, sneezing, nasal congestion or excess/ purulent secretions, fever, chills, sweats, unintended wt loss, pleuritic or exertional cp, hempoptysis, orthopnea pnd or change in chronic leg swelling. Also denies presyncope, palpitations, heartburn, abdominal pain, nausea, vomiting, diarrhea or change in bowel or urinary habits, dysuria,hematuria, rash, arthralgias, visual complaints, headache, numbness weakness or ataxia.     Objective:   Physical Exam  Gen. Pleasant, well-nourished, in no distress ENT - no lesions, no post nasal drip Neck: No JVD, no thyromegaly, no carotid bruits Lungs: no use of accessory muscles, no dullness to percussion, clear without rales or rhonchi  Cardiovascular: Rhythm regular, heart sounds  normal, no murmurs or gallops, no peripheral edema Musculoskeletal: No deformities, no cyanosis or clubbing        Assessment & Plan:

## 2014-04-29 NOTE — Assessment & Plan Note (Signed)
Congratulations on quitting ! Take mucinex for phelgm Call for antibiotic if green or yellow phlegm Refills #5  for symbicort

## 2014-04-29 NOTE — Patient Instructions (Signed)
Congratulations on quitting ! Take mucinex for phelgm Call for antibiotic if green or yellow phlegm Refills #5  for symbicort

## 2014-05-01 ENCOUNTER — Ambulatory Visit: Payer: Medicare Other | Admitting: Pulmonary Disease

## 2014-05-05 ENCOUNTER — Other Ambulatory Visit: Payer: Self-pay | Admitting: Pulmonary Disease

## 2014-05-15 ENCOUNTER — Ambulatory Visit: Payer: Medicare Other | Admitting: Gastroenterology

## 2014-05-15 NOTE — Progress Notes (Signed)
Patient ID: Diane Patterson, female   DOB: 05/29/1946, 68 y.o.   MRN: [id number] Patient no-showed today's appointment; provider notified for review of record.     Please send a no-show letter to the patient recommending that they reschedule the appointment.   Also send a letter to the referring provider alerting them of the NO SHOW.

## 2014-08-19 ENCOUNTER — Ambulatory Visit (INDEPENDENT_AMBULATORY_CARE_PROVIDER_SITE_OTHER): Payer: Medicare Other | Admitting: Internal Medicine

## 2014-08-19 ENCOUNTER — Encounter: Payer: Self-pay | Admitting: Internal Medicine

## 2014-08-19 VITALS — BP 150/90 | HR 73 | Temp 97.7°F | Resp 18 | Ht 68.0 in | Wt 152.2 lb

## 2014-08-19 DIAGNOSIS — I10 Essential (primary) hypertension: Secondary | ICD-10-CM

## 2014-08-19 DIAGNOSIS — R7303 Prediabetes: Secondary | ICD-10-CM | POA: Insufficient documentation

## 2014-08-19 DIAGNOSIS — R7309 Other abnormal glucose: Secondary | ICD-10-CM

## 2014-08-19 DIAGNOSIS — E785 Hyperlipidemia, unspecified: Secondary | ICD-10-CM | POA: Insufficient documentation

## 2014-08-19 DIAGNOSIS — Z0000 Encounter for general adult medical examination without abnormal findings: Secondary | ICD-10-CM

## 2014-08-19 DIAGNOSIS — R0989 Other specified symptoms and signs involving the circulatory and respiratory systems: Secondary | ICD-10-CM | POA: Insufficient documentation

## 2014-08-19 NOTE — Progress Notes (Signed)
Subjective:    Patient ID: Diane Patterson, female    DOB: Sep 29, 1945, 69 y.o.   MRN: [id number]  HPI  Medicare Wellness Visit: Psychosocial and medical history were reviewed as required by Medicare (history related to abuse, antisocial behavior , firearm risk). Social history: Caffeine: 1 cup coffee/day Alcohol:  no Tobacco use: quit 03/20/2014 Exercise:no Personal safety/fall risk:no Limitations of activities of daily living:no Seatbelt/ smoke alarm use:yes Healthcare Power of Attorney/Living Will status and End of Life process assessment : needed;discussed Ophthalmologic exam status:UTD Hearing evaluation status:not UTD Orientation: Oriented X 3 Memory and recall: good Math testing: good Depression/anxiety assessment: no Foreign travel history:never Immunization status for influenza/pneumonia/ shingles /tetanus:all UTD Transfusion history: no Preventive health care maintenance status: Colonoscopy/BMD/mammogram/Pap as per protocol/standard care: UTD Dental care: every 6 mos  Chart reviewed and updated. Active issues reviewed and addressed as documented below.  She quit smoking in October 2015. She has gained from 123 up to 150 pounds. She's not able to exercise due to her lung disease. She also recently fractured her toe.  She is not on a heart healthy low-salt diet. She's not monitoring blood pressure at home.  She believes her colonoscopy is up-to-date. She has no active GI symptoms.  She has active follow-up by pulmonology for her COPD.She has chronic sputum production of thick phlegm.  She did have liver function tests and CBC and differential in October 2015. These were normal. A1c was 6.4% at that time and TSH was therapeutic. She's had no lipids since 2011. LDL was 136, HDL 55.1, and triglycerides 138 at that time.  Review of Systems    Chest pain, palpitations, tachycardia,  paroxysmal nocturnal dyspnea, claudication or edema are absent.  Unexplained weight  loss, abdominal pain, significant dyspepsia, dysphagia, melena, rectal bleeding, or persistently small caliber stools are denied.  She describes easy bruising but no other bleeding dyscrasias.Epistaxis, hemoptysis, or hematuria bleeding denied.There is no abnormal bleeding or difficulty stopping bleeding with injury.CBC reviewed  Objective:   Physical Exam Gen.: Adequately nourished in appearance. Alert, appropriate and cooperative throughout exam. Weathered facies. Head: Normocephalic without obvious abnormalities  Eyes: No corneal or conjunctival inflammation noted. Pupils equal round reactive to light and accommodation. Extraocular motion intact.  Ears: External  ear exam reveals no significant lesions or deformities. Canals clear .TMs normal. Hearing is grossly normal bilaterally. Nose: External nasal exam reveals no deformity or inflammation. R nasal mucosa dry & erythematous. No lesions or exudates noted.   Mouth: Oral mucosa and oropharynx reveal no lesions or exudates. Teeth in fair-good repair. Neck: No deformities, masses, or tenderness noted. Range of motion & Thyroid normal. Lungs: Breath sounds decreased.Normal respiratory effort; chest expands symmetrically. Lungs are clear to auscultation without rales, wheezes, or increased work of breathing. Heart: Normal rate and rhythm. Normal S1 and S2. No gallop, click, or rub. No murmur. Abdomen: Bowel sounds normal; abdomen soft and  nontender. No masses, organomegaly or hernias noted. Genitalia: as per Gyn                                  Musculoskeletal/extremities: Accentuated curvature of upper thoracic spine.  No clubbing, cyanosis, edema, or significant extremity  deformity noted.  Range of motion normal . Tone & strength normal. Hand joints normal.  Fingernail  health good. Crepitus of knees  Able to lie down & sit up w/o help.  Negative SLR bilaterally Vascular: Carotid, radial artery, dorsalis pedis and  posterior tibial pulses are full and equal. ? Faint R carotid bruit is present. Neurologic: Alert and oriented x3. Deep tendon reflexes symmetrical and normal.  Gait normal        Skin: Intact without suspicious lesions or rashes.Bruising dorsum of hands Lymph: No cervical, axillary lymphadenopathy present. Psych: Mood and affect are normal. Normally interactive                                                                                       Assessment & Plan:  See Current Assessment & Plan in Problem List under specific DiagnosisThe labs will be reviewed and risks and options assessed. Written recommendations will be provided by mail or directly through My Chart.Further evaluation or change in medical therapy will be directed by those results.

## 2014-08-19 NOTE — Progress Notes (Signed)
Pre visit review using our clinic review tool, if applicable. No additional management support is needed unless otherwise documented below in the visit note. 

## 2014-08-19 NOTE — Patient Instructions (Signed)
Minimal Blood Pressure Goal= AVERAGE < 140/90;  Ideal is an AVERAGE < 135/85. This AVERAGE should be calculated from @ least 5-7 BP readings taken @ different times of day on different days of week. You should not respond to isolated BP readings , but rather the AVERAGE for that week .Please bring your  blood pressure cuff to office visits to verify that it is reliable.It  can also be checked against the blood pressure device at the pharmacy. Finger or wrist cuffs are not dependable; an arm cuff is. Your next office appointment will be determined based upon review of your pending labs.  Those instructions will be transmitted to you by mail. Critical values will be called. Followup as needed for any active or acute issue. Please report any significant change in your symptoms. 

## 2014-08-20 ENCOUNTER — Telehealth: Payer: Self-pay | Admitting: Internal Medicine

## 2014-08-20 NOTE — Telephone Encounter (Signed)
emmi mailed  °

## 2014-08-21 ENCOUNTER — Telehealth: Payer: Self-pay | Admitting: Pulmonary Disease

## 2014-08-21 NOTE — Telephone Encounter (Signed)
ATC several times number given in message [phone]) unable to get through, each time line was either fast busy signal or D/C tone. ATC husband [name] at work listed in chart, no works there by that name. Will need to try back later.

## 2014-08-21 NOTE — Telephone Encounter (Signed)
atc pt, line busy.  wcb.

## 2014-08-22 MED ORDER — AZITHROMYCIN 250 MG PO TABS: ORAL_TABLET | ORAL | Status: AC

## 2014-08-22 NOTE — Telephone Encounter (Signed)
PT c/o PND, prod cough (yellow), sinus congestion, sob, mild sorethroat.  Denies fever.  Offered appt in [place] today but states that is too far for them to drive.  Requesting rx be called in .  Please advise.  No Known Allergies

## 2014-08-22 NOTE — Telephone Encounter (Signed)
z-pak 

## 2014-08-22 NOTE — Telephone Encounter (Signed)
Pt is aware of RA's recommendation. Rx has been sent in.

## 2014-08-28 ENCOUNTER — Encounter: Payer: Self-pay | Admitting: Adult Health

## 2014-08-28 ENCOUNTER — Ambulatory Visit (INDEPENDENT_AMBULATORY_CARE_PROVIDER_SITE_OTHER): Payer: Medicare Other | Admitting: Adult Health

## 2014-08-28 VITALS — BP 144/90 | HR 77 | Temp 98.0°F | Ht 68.0 in | Wt 147.0 lb

## 2014-08-28 DIAGNOSIS — J449 Chronic obstructive pulmonary disease, unspecified: Secondary | ICD-10-CM

## 2014-08-28 MED ORDER — AMOXICILLIN-POT CLAVULANATE 875-125 MG PO TABS: 1.0000 | ORAL_TABLET | Freq: Two times a day (BID) | ORAL | Status: AC

## 2014-08-28 MED ORDER — PREDNISONE 10 MG PO TABS: ORAL_TABLET | ORAL | Status: DC

## 2014-08-28 NOTE — Assessment & Plan Note (Addendum)
Exacerbation -slow to resolve  xopenex neb x 1   Plan  Augmentin 875mg  Twice daily  For 7 days  Mucinex DM Twice daily  For 7 days  Prednisone taper over next week.  Increase Symbicort 160 2 puffs Twice daily  ,rinse after use.  Restart Spiriva Respimat 2 puffs daily  Follow up Dr. Elsworth Soho  In 3 months and As needed   Please contact office for sooner follow up if symptoms do not improve or worsen or seek emergency care

## 2014-08-28 NOTE — Patient Instructions (Addendum)
Augmentin 875mg  Twice daily  For 7 days  Mucinex DM Twice daily  For 7 days  Prednisone taper over next week.  Increase Symbicort 160 2 puffs Twice daily  ,rinse after use.  Restart Spiriva Respimat 2 puffs daily  Follow up Dr. Elsworth Soho  In 3 months and As needed   Please contact office for sooner follow up if symptoms do not improve or worsen or seek emergency care

## 2014-08-28 NOTE — Progress Notes (Signed)
   Subjective:    Patient ID: Diane Patterson, female    DOB: 1945/09/09, 69 y.o.   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 ABout 1 flare/ year needing steroids    08/28/2014 Acute OV  Complains of 1 week of increased SOB, wheezing, tightness, prod cough with yellow mucus. Took zpack with no help. Not smoking since 5 months .  Breathing worse for last 2 month.  Denies any f/c/s, n/v/d, hemoptysis Not taking spriiva -"was told it caused cancer". Pt education provided.  Remains on symbicort 80 .  Coughing up thick yellow mucus, very sticky .  Appetite is good . No n/v.    Review of Systems neg for any significant sore throat, dysphagia, itching, sneezing, nasal congestion or excess/ purulent secretions, fever, chills, sweats, unintended wt loss, pleuritic or exertional cp, hempoptysis, orthopnea pnd or change in chronic leg swelling. Also denies presyncope, palpitations, heartburn, abdominal pain, nausea, vomiting, diarrhea or change in bowel or urinary habits, dysuria,hematuria, rash, arthralgias, visual complaints, headache, numbness weakness or ataxia.     Objective:   Physical Exam  Gen. Pleasant, well-nourished, in no distress ENT - no lesions, no post nasal drip Neck: No JVD, no thyromegaly, no carotid bruits Lungs: no use of accessory muscles, no dullness to percussion, exp wheezing bilaterally , no stridor.  Cardiovascular: Rhythm regular, heart sounds  normal, no murmurs or gallops, no peripheral edema Musculoskeletal: No deformities, no cyanosis or clubbing        Assessment & Plan:

## 2014-08-29 MED ORDER — LEVALBUTEROL HCL 0.63 MG/3ML IN NEBU
0.6300 mg | INHALATION_SOLUTION | Freq: Once | RESPIRATORY_TRACT | Status: AC
  Administered 2014-08-28: 0.63 mg via RESPIRATORY_TRACT

## 2014-08-29 MED ORDER — BUDESONIDE-FORMOTEROL FUMARATE 160-4.5 MCG/ACT IN AERO: 2.0000 | INHALATION_SPRAY | Freq: Two times a day (BID) | RESPIRATORY_TRACT | Status: DC

## 2014-08-29 MED ORDER — TIOTROPIUM BROMIDE MONOHYDRATE 2.5 MCG/ACT IN AERS
2.0000 | INHALATION_SPRAY | Freq: Every day | RESPIRATORY_TRACT | Status: DC
Start: 2014-08-29 — End: 2014-10-16

## 2014-08-29 NOTE — Addendum Note (Signed)
Addended by: Parke Poisson E on: 08/29/2014 01:05 PM   Modules accepted: Orders, Medications

## 2014-08-29 NOTE — Addendum Note (Signed)
Addended by: Parke Poisson E on: 08/29/2014 04:35 PM   Modules accepted: Orders

## 2014-08-30 NOTE — Progress Notes (Signed)
Reviewed & agree with plan  

## 2014-09-21 ENCOUNTER — Other Ambulatory Visit: Payer: Self-pay | Admitting: Internal Medicine

## 2014-10-16 ENCOUNTER — Encounter: Payer: Self-pay | Admitting: Internal Medicine

## 2014-10-16 ENCOUNTER — Ambulatory Visit (INDEPENDENT_AMBULATORY_CARE_PROVIDER_SITE_OTHER): Payer: Medicare Other | Admitting: Internal Medicine

## 2014-10-16 VITALS — BP 170/110 | HR 64 | Temp 97.8°F | Ht 68.0 in | Wt 152.2 lb

## 2014-10-16 DIAGNOSIS — F411 Generalized anxiety disorder: Secondary | ICD-10-CM | POA: Diagnosis not present

## 2014-10-16 DIAGNOSIS — I10 Essential (primary) hypertension: Secondary | ICD-10-CM

## 2014-10-16 DIAGNOSIS — J441 Chronic obstructive pulmonary disease with (acute) exacerbation: Secondary | ICD-10-CM | POA: Diagnosis not present

## 2014-10-16 DIAGNOSIS — Z638 Other specified problems related to primary support group: Secondary | ICD-10-CM

## 2014-10-16 MED ORDER — DOXYCYCLINE HYCLATE 100 MG PO TABS: 100.0000 mg | ORAL_TABLET | Freq: Two times a day (BID) | ORAL | Status: DC

## 2014-10-16 MED ORDER — DILTIAZEM HCL ER 90 MG PO CP12: ORAL_CAPSULE | ORAL | Status: DC

## 2014-10-16 MED ORDER — HYDROCHLOROTHIAZIDE 12.5 MG PO CAPS: 12.5000 mg | ORAL_CAPSULE | Freq: Every day | ORAL | Status: DC

## 2014-10-16 NOTE — Progress Notes (Signed)
   Subjective:    Patient ID: Diane Patterson, female    DOB: [birth year], 69 y.o.   MRN: [id number]  HPI She is here with exacerbation of chronic chest symptoms. She produces thick sputum which has become yellow recently. There is associated wheezing. This is the context of her COPD which is associated with chronic dyspnea and two-pillow paroxysmal nocturnal dyspnea. She did quit smoking 7 months ago.  She blames her hypertension: on stress over her brother's book which makes derogatory comments about their mother. Their father left her mother with 9 children. They were raised in an orphanage. They've all been highly successful in life  She is not monitoring blood pressure at home. She does restrict sodium.  Review of Systems Frontal headache, facial pain , nasal purulence, dental pain, sore throat , otic pain or otic discharge denied. No fever , chills or sweats. Chest pain, palpitations, tachycardia, claudication or edema are absent.    Objective:   Physical Exam Pertinent or positive findings include:  She has weathered facies.  Tympanic membranes are dull.  Arteriolar narrowing is present.  Nasal mucosa and septi are markedly erythematous.  She has a slow S4. Heart sounds are distant.  Breath sounds are decreased.  She has slight crepitus of knees.  Pedal pulses are equal but decreased   General appearance :adequately nourished; in no distress. Eyes: No conjunctival inflammation or scleral icterus is present. Oral exam:  Lips and gums are healthy appearing.There is no oropharyngeal erythema or exudate noted. Dental hygiene is good. Heart:  regular rhythm. S1 and S2 normal without gallop, murmur, click, rub or other extra sounds   Lungs:Chest clear to auscultation; no wheezes, rhonchi,rales ,or rubs present.No increased work of breathing.  Abdomen: bowel sounds normal, soft and non-tender without masses, organomegaly or hernias noted.  No guarding or rebound.  Vascular : all  pulses equal ; no bruits present. Skin:Warm & dry.  Intact without suspicious lesions or rashes ; no tenting or jaundice  Lymphatic: No lymphadenopathy is noted about the head, neck, axilla Neuro: Strength, tone normal.        Assessment & Plan:  #1 acute chronic bronchitis exacerbation  #2 uncontrolled hypertension  #3 exogenous stress  Plan: See orders recommendations

## 2014-10-16 NOTE — Progress Notes (Signed)
Pre visit review using our clinic review tool, if applicable. No additional management support is needed unless otherwise documented below in the visit note. 

## 2014-10-16 NOTE — Patient Instructions (Signed)
Plain Mucinex (NOT D) for thick secretions ;force NON dairy fluids .   Nasal cleansing in the shower as discussed with lather of mild shampoo.After 10 seconds wash off lather while  exhaling through nostrils. Make sure that all residual soap is removed to prevent irritation.  Flonase OR Nasacort AQ 1 spray in each nostril twice a day as needed. Use the "crossover" technique into opposite nostril spraying toward opposite ear @ 45 degree angle, not straight up into nostril.  Plain Allegra (NOT D )  160 daily , Loratidine 10 mg , OR Zyrtec 10 mg @ bedtime  as needed for itchy eyes & sneezing. Minimal Blood Pressure Goal= AVERAGE < 140/90;  Ideal is an AVERAGE < 135/85. This AVERAGE should be calculated from @ least 5-7 BP readings taken @ different times of day on different days of week. You should not respond to isolated BP readings , but rather the AVERAGE for that week .Please bring your  blood pressure cuff to office visits to verify that it is reliable.It  can also be checked against the blood pressure device at the pharmacy. Finger or wrist cuffs are not dependable; an arm cuff is.

## 2014-12-06 ENCOUNTER — Telehealth: Payer: Self-pay | Admitting: Internal Medicine

## 2014-12-06 NOTE — Telephone Encounter (Signed)
Patient states Dr. Linna Darner was to change her blood pressure medication but her pharmacy has not received a script.  Patient uses [facility] at [address]

## 2015-01-27 ENCOUNTER — Other Ambulatory Visit: Payer: Self-pay | Admitting: Adult Health

## 2015-02-10 ENCOUNTER — Telehealth: Payer: Self-pay

## 2015-02-10 NOTE — Telephone Encounter (Signed)
Ms. Vu called back and  Explained that Dr. Linna Darner would prefer to not do the AWV at the same time of this physical. Offered for them to come in another day prior to the apt, or to keep the apt and Dr. Linna Darner can complete of if there are other issues, he can refer to me for apt post his CPE.  They will fup with Dr. Linna Darner. I will cancel AWV same day

## 2015-02-10 NOTE — Telephone Encounter (Signed)
Call regarding AWV; LVM that the patient and his wife was scheduled for AWV prior to seeing Dr. Linna Darner and this needs to be scheduled on a separate day or he can just see Dr. Linna Darner this year. Left confidential VM for call back.

## 2015-03-03 ENCOUNTER — Encounter: Payer: Medicare Other | Admitting: Internal Medicine

## 2015-03-10 ENCOUNTER — Telehealth: Payer: Self-pay | Admitting: Pulmonary Disease

## 2015-03-10 MED ORDER — AZITHROMYCIN 250 MG PO TABS: ORAL_TABLET | ORAL | Status: DC

## 2015-03-10 NOTE — Telephone Encounter (Signed)
Spoke with pt, aware of recs.  zpak sent to preferred pharmacy.  Nothing further needed.  

## 2015-03-10 NOTE — Telephone Encounter (Signed)
lmtcb x1 for pt. 

## 2015-03-10 NOTE — Telephone Encounter (Signed)
Spoke with pt. Would like something called into the pharmacy. Reports chest congestion. Denies chest tightness, SOB or coughing. States that she is not getting any mucus up at all. Has been taking Mucinex with no relief.  RA - please advise. Thanks.

## 2015-03-10 NOTE — Telephone Encounter (Signed)
[phone] calling back

## 2015-03-10 NOTE — Telephone Encounter (Signed)
Z pak

## 2015-04-12 ENCOUNTER — Other Ambulatory Visit: Payer: Self-pay | Admitting: Adult Health

## 2015-04-14 ENCOUNTER — Telehealth: Payer: Self-pay | Admitting: Pulmonary Disease

## 2015-04-14 NOTE — Telephone Encounter (Signed)
lmtcb for pt.  

## 2015-04-15 NOTE — Telephone Encounter (Signed)
Looks like Rx was sent in 10/31 to [facility]. Pt is aware. Nothing needed further

## 2015-04-16 ENCOUNTER — Ambulatory Visit: Payer: Medicare Other | Admitting: Pulmonary Disease

## 2015-04-21 ENCOUNTER — Ambulatory Visit (INDEPENDENT_AMBULATORY_CARE_PROVIDER_SITE_OTHER): Payer: Medicare Other | Admitting: Physician Assistant

## 2015-04-21 ENCOUNTER — Encounter: Payer: Self-pay | Admitting: Physician Assistant

## 2015-04-21 VITALS — BP 152/88 | HR 76 | Temp 98.0°F | Resp 18 | Wt 159.0 lb

## 2015-04-21 DIAGNOSIS — Z87891 Personal history of nicotine dependence: Secondary | ICD-10-CM

## 2015-04-21 DIAGNOSIS — J988 Other specified respiratory disorders: Secondary | ICD-10-CM | POA: Diagnosis not present

## 2015-04-21 DIAGNOSIS — J449 Chronic obstructive pulmonary disease, unspecified: Secondary | ICD-10-CM | POA: Diagnosis not present

## 2015-04-21 DIAGNOSIS — B9689 Other specified bacterial agents as the cause of diseases classified elsewhere: Secondary | ICD-10-CM

## 2015-04-21 MED ORDER — AZITHROMYCIN 250 MG PO TABS: ORAL_TABLET | ORAL | Status: DC

## 2015-04-21 MED ORDER — HYDROCODONE-HOMATROPINE 5-1.5 MG/5ML PO SYRP: 5.0000 mL | ORAL_SOLUTION | Freq: Three times a day (TID) | ORAL | Status: DC | PRN

## 2015-04-21 NOTE — Progress Notes (Signed)
Patient ID: Diane Patterson MRN: [id number], DOB: 24-Dec-1945, 69 y.o. Date of Encounter: 04/21/2015, 11:00 AM    Chief Complaint:  Chief Complaint  Patient presents with  . sick x 1 week    cough, just feels bad     HPI: 69 y.o. year old white female states that for the past week she has had chest congestion and cough and feeling run down. His had no mucus from her nose and no significant congestion in her head or nose. No fevers or chills. No sore throat or earache. Requesting Hycodan to use for cough. Is been using Mucinex.     Home Meds:   Outpatient Prescriptions Prior to Visit  Medication Sig Dispense Refill  . albuterol (PROVENTIL HFA;VENTOLIN HFA) 108 (90 BASE) MCG/ACT inhaler Inhale 2 puffs into the lungs every 4 (four) hours as needed for wheezing or shortness of breath. 1 Inhaler 3  . diltiazem (CARDIZEM SR) 90 MG 12 hr capsule 1 bid 180 capsule 3  . fluorouracil (EFUDEX) 5 % cream Apply 1 application topically daily.    . hydrochlorothiazide (MICROZIDE) 12.5 MG capsule Take 1 capsule (12.5 mg total) by mouth daily. 30 capsule 5  . SYMBICORT 160-4.5 MCG/ACT inhaler INHALE 2 PUFFS BY MOUTH TWICE DAILY 10.2 g 0  . azithromycin (ZITHROMAX Z-PAK) 250 MG tablet Take 2 tabs today, then 1 daily until gone. 6 each 0  . doxycycline (VIBRA-TABS) 100 MG tablet Take 1 tablet (100 mg total) by mouth 2 (two) times daily. 20 tablet 0   No facility-administered medications prior to visit.    Allergies: No Known Allergies    Review of Systems: See HPI for pertinent ROS. All other ROS negative.    Physical Exam: Blood pressure 152/88, pulse 76, temperature 98 F (36.7 C), temperature source Oral, resp. rate 18, weight 159 lb (72.122 kg), SpO2 98 %., Body mass index is 24.18 kg/(m^2). General:  WNWD WF. Appears in no acute distress. HEENT: Normocephalic, atraumatic, eyes without discharge, sclera non-icteric, nares are without discharge. Bilateral auditory canals clear,  TM's are without perforation, pearly grey and translucent with reflective cone of light bilaterally. Oral cavity moist, posterior pharynx without exudate, erythema, peritonsillar abscess. No tenderness with percussion of frontal or maxillary sinuses bilaterally.  Neck: Supple. No thyromegaly. No lymphadenopathy. Lungs: Clear bilaterally to auscultation without wheezes, rales, or rhonchi. Breathing is unlabored. Heart: Regular rhythm. No murmurs, rubs, or gallops. Msk:  Strength and tone normal for age. Extremities/Skin: Warm and dry. Neuro: Alert and oriented X 3. Moves all extremities spontaneously. Gait is normal. CNII-XII grossly in tact. Psych:  Responds to questions appropriately with a normal affect.     ASSESSMENT AND PLAN:  69 y.o. year old female with  1. Bacterial respiratory infection She is to take antibiotic as directed. Follow-up if symptoms do not resolve/return to baseline within 1 week after completion of antibiotic. - azithromycin (ZITHROMAX) 250 MG tablet; Day 1: Take 2 daily. Days 2-5: Take 1 daily.  Dispense: 6 tablet; Refill: 0 - HYDROcodone-homatropine (HYCODAN) 5-1.5 MG/5ML syrup; Take 5 mLs by mouth every 8 (eight) hours as needed for cough.  Dispense: 120 mL; Refill: 0  2. Chronic obstructive pulmonary disease, unspecified COPD type ([place]) This is stable/controlled. No COPD exacerbation at this time--- lungs are clear on exam and oxygen saturation is 98% on room air. She is to follow-up if she feels that her breathing is worsening or if she is having wheezing.  3. Former smoker  Signed, [address] [place], [place], BSFM 04/21/2015 11:00 AM

## 2015-04-24 ENCOUNTER — Other Ambulatory Visit: Payer: Self-pay | Admitting: Emergency Medicine

## 2015-04-24 DIAGNOSIS — I10 Essential (primary) hypertension: Secondary | ICD-10-CM

## 2015-04-24 MED ORDER — HYDROCHLOROTHIAZIDE 12.5 MG PO CAPS: 12.5000 mg | ORAL_CAPSULE | Freq: Every day | ORAL | Status: DC

## 2015-04-29 ENCOUNTER — Encounter: Payer: Self-pay | Admitting: Adult Health

## 2015-04-29 ENCOUNTER — Ambulatory Visit (INDEPENDENT_AMBULATORY_CARE_PROVIDER_SITE_OTHER): Payer: Medicare Other | Admitting: Adult Health

## 2015-04-29 VITALS — BP 136/84 | HR 79 | Temp 97.6°F | Ht 67.0 in | Wt 159.0 lb

## 2015-04-29 DIAGNOSIS — J449 Chronic obstructive pulmonary disease, unspecified: Secondary | ICD-10-CM | POA: Diagnosis not present

## 2015-04-29 MED ORDER — LEVALBUTEROL HCL 0.63 MG/3ML IN NEBU
0.6300 mg | INHALATION_SOLUTION | Freq: Once | RESPIRATORY_TRACT | Status: AC
  Administered 2015-04-29: 0.63 mg via RESPIRATORY_TRACT

## 2015-04-29 NOTE — Patient Instructions (Signed)
Continue on current regimen  Follow up Dr. Alva  In 3-4  months and As needed    

## 2015-04-29 NOTE — Progress Notes (Signed)
Subjective:    Patient ID: Zane Herald, female    DOB: February 02, 1946, 69 y.o.   MRN: [id number]  HPI 69 yo female former smoker with GOLD IV COPD  FEV1 27% in 2009   04/29/2015  Follow up : COPD  Pt returns for follow up . Says she is about at her baseline.  Has good and bad days.  Has SOB with activity, wheezing at times, and sinus drainage.  Denies any chest tightness/congestion, cough, fever, nausea or vomiting.  Remains on Symbicort .  Does not want to take spiriva.  cxr 02/2014 copd changes .  PVX, Prevnar and flu tud.    Past Medical History  Diagnosis Date  . Tobacco abuse   . Venous insufficiency   . Chronic airflow obstruction (HCC)   . Hypertension   . Asthma     Current Outpatient Prescriptions on File Prior to Visit  Medication Sig Dispense Refill  . albuterol (PROVENTIL HFA;VENTOLIN HFA) 108 (90 BASE) MCG/ACT inhaler Inhale 2 puffs into the lungs every 4 (four) hours as needed for wheezing or shortness of breath. 1 Inhaler 3  . diltiazem (CARDIZEM SR) 90 MG 12 hr capsule 1 bid 180 capsule 3  . fluorouracil (EFUDEX) 5 % cream Apply 1 application topically daily.    . hydrochlorothiazide (MICROZIDE) 12.5 MG capsule Take 1 capsule (12.5 mg total) by mouth daily. 90 capsule 0  . SYMBICORT 160-4.5 MCG/ACT inhaler INHALE 2 PUFFS BY MOUTH TWICE DAILY 10.2 g 0  . HYDROcodone-homatropine (HYCODAN) 5-1.5 MG/5ML syrup Take 5 mLs by mouth every 8 (eight) hours as needed for cough. (Patient not taking: Reported on 04/29/2015) 120 mL 0   No current facility-administered medications on file prior to visit.       Review of Systems Constitutional:   No  weight loss, night sweats,  Fevers, chills,  +fatigue, or  lassitude.  HEENT:   No headaches,  Difficulty swallowing,  Tooth/dental problems, or  Sore throat,                No sneezing, itching, ear ache,  +nasal congestion, post nasal drip,   CV:  No chest pain,  Orthopnea, PND, swelling in lower extremities,  anasarca, dizziness, palpitations, syncope.   GI  No heartburn, indigestion, abdominal pain, nausea, vomiting, diarrhea, change in bowel habits, loss of appetite, bloody stools.   Resp:    No chest wall deformity  Skin: no rash or lesions.  GU: no dysuria, change in color of urine, no urgency or frequency.  No flank pain, no hematuria   MS:  No joint pain or swelling.  No decreased range of motion.  No back pain.  Psych:  No change in mood or affect. No depression or anxiety.  No memory loss.         Objective:   Physical Exam  GEN: A/Ox3; pleasant , NAD, elderly , chronically ill appearing   HEENT:  [place]/AT,  EACs-clear, TMs-wnl, NOSE-clear, THROAT-clear, no lesions, no postnasal drip or exudate noted.   NECK:  Supple w/ fair ROM; no JVD; normal carotid impulses w/o bruits; no thyromegaly or nodules palpated; no lymphadenopathy.  RESP  Decreased BS in bases no accessory muscle use, no dullness to percussion  CARD:  RRR, no m/r/g  , no peripheral edema, pulses intact, no cyanosis or clubbing.  GI:   Soft & nt; nml bowel sounds; no organomegaly or masses detected.  Musco: Warm bil, no deformities or joint swelling noted.   Neuro:  alert, no focal deficits noted.    Skin: Warm, no lesions or rashes        Assessment & Plan:

## 2015-05-03 NOTE — Assessment & Plan Note (Signed)
Compensated without flare  Vaccines utd.  paln  Continue on current regimen .  Follow up Dr. Elsworth Soho  In 3-4 months and As needed

## 2015-05-05 ENCOUNTER — Telehealth: Payer: Self-pay | Admitting: Pulmonary Disease

## 2015-05-05 NOTE — Telephone Encounter (Signed)
Called and spoke with pt Pt stated she was returning [facility] phone call about scheduling appt with RA in 3 months Pt was scheduled 08/20/15 at 2:30pm  Nothing further is needed

## 2015-05-11 ENCOUNTER — Other Ambulatory Visit: Payer: Self-pay | Admitting: Pulmonary Disease

## 2015-05-11 NOTE — Telephone Encounter (Signed)
On call- LOV TP was to refill Symbicort but pt reports drug store s get order. Plan- e-scripted to [facility]

## 2015-06-16 ENCOUNTER — Emergency Department (HOSPITAL_COMMUNITY): Payer: Medicare Other

## 2015-06-16 ENCOUNTER — Emergency Department (HOSPITAL_COMMUNITY)
Admission: EM | Admit: 2015-06-16 | Discharge: 2015-06-16 | Disposition: A | Discharge status: Home / Self Care | Payer: Medicare Other | Attending: Emergency Medicine | Admitting: Emergency Medicine

## 2015-06-16 ENCOUNTER — Encounter (HOSPITAL_COMMUNITY): Payer: Self-pay | Admitting: Emergency Medicine

## 2015-06-16 DIAGNOSIS — Z79899 Other long term (current) drug therapy: Secondary | ICD-10-CM | POA: Diagnosis not present

## 2015-06-16 DIAGNOSIS — I10 Essential (primary) hypertension: Secondary | ICD-10-CM | POA: Insufficient documentation

## 2015-06-16 DIAGNOSIS — Z87891 Personal history of nicotine dependence: Secondary | ICD-10-CM | POA: Insufficient documentation

## 2015-06-16 DIAGNOSIS — J441 Chronic obstructive pulmonary disease with (acute) exacerbation: Secondary | ICD-10-CM | POA: Diagnosis not present

## 2015-06-16 DIAGNOSIS — R0602 Shortness of breath: Secondary | ICD-10-CM | POA: Diagnosis present

## 2015-06-16 MED ORDER — ACETAMINOPHEN 500 MG PO TABS
1000.0000 mg | ORAL_TABLET | Freq: Once | ORAL | Status: AC
  Administered 2015-06-16: 1000 mg via ORAL
  Filled 2015-06-16: qty 2

## 2015-06-16 MED ORDER — PREDNISONE 20 MG PO TABS: ORAL_TABLET | ORAL | Status: DC

## 2015-06-16 MED ORDER — PREDNISONE 20 MG PO TABS
60.0000 mg | ORAL_TABLET | Freq: Once | ORAL | Status: AC
  Administered 2015-06-16: 60 mg via ORAL
  Filled 2015-06-16: qty 3

## 2015-06-16 MED ORDER — ALBUTEROL SULFATE (2.5 MG/3ML) 0.083% IN NEBU
5.0000 mg | INHALATION_SOLUTION | Freq: Once | RESPIRATORY_TRACT | Status: AC
  Administered 2015-06-16: 5 mg via RESPIRATORY_TRACT
  Filled 2015-06-16: qty 6

## 2015-06-16 NOTE — ED Notes (Signed)
Brought from home via EMS.  Called out for SOB.  Per patient had eaten cornbread last night and set her emphysema off.  Self medicated with neb trx at home with no relief.  On arrival of EMS bp 280/140 and initial Sat 92% on RA.  After albuteral 5mg  and atrovent 0.5 sat 100%.

## 2015-06-16 NOTE — Discharge Instructions (Signed)

## 2015-06-16 NOTE — ED Provider Notes (Signed)
CSN: [id number]     Arrival date & time 06/16/15  0436 History   First MD Initiated Contact with Patient 06/16/15 [phone]     Chief Complaint  Patient presents with  . Shortness of Breath     (Consider location/radiation/quality/duration/timing/severity/associated sxs/prior Treatment) Patient is a 70 y.o. female presenting with shortness of breath. The history is provided by the patient.  Shortness of Breath Severity:  Moderate Onset quality:  Gradual Duration:  2 days Timing:  Constant Progression:  Worsening Chronicity:  Chronic Context: URI   Relieved by:  Nothing Worsened by:  Nothing tried Ineffective treatments:  None tried Associated symptoms: cough and wheezing   Associated symptoms: no chest pain, no fever, no headaches and no vomiting    70 yo F with a chief complaint of sob.  Hx of copd feels similar to prior.  Feels worsened by URI.  Denies fevers, chills.   Past Medical History  Diagnosis Date  . Tobacco abuse   . Venous insufficiency   . Chronic airflow obstruction (HCC)   . Hypertension   . Asthma    Past Surgical History  Procedure Laterality Date  . Vesicovaginal fistula closure w/ tah    . Neck surgery    . Cataract    . No colonoscopy      "afraid to "; [facility] reviewed   Family History  Problem Relation Age of Onset  . Emphysema Father   . Asthma Father   . Heart disease Father   . Diabetes Mother    Social History  Substance Use Topics  . Smoking status: Former Smoker -- 0.75 packs/day for 15 years    Types: Cigarettes    Quit date: 03/20/2014  . Smokeless tobacco: Former Systems developer    Quit date: 03/11/2014  . Alcohol Use: No   OB History    No data available     Review of Systems  Constitutional: Negative for fever and chills.  HENT: Positive for congestion and rhinorrhea.   Eyes: Negative for redness and visual disturbance.  Respiratory: Positive for cough, shortness of breath and wheezing.   Cardiovascular: Negative for chest pain and  palpitations.  Gastrointestinal: Negative for nausea and vomiting.  Genitourinary: Negative for dysuria and urgency.  Musculoskeletal: Negative for myalgias and arthralgias.  Skin: Negative for pallor and wound.  Neurological: Negative for dizziness and headaches.      Allergies  Review of patient's allergies indicates no known allergies.  Home Medications   Prior to Admission medications   Medication Sig Start Date End Date Taking? Authorizing Provider  albuterol (PROVENTIL HFA;VENTOLIN HFA) 108 (90 BASE) MCG/ACT inhaler Inhale 2 puffs into the lungs every 4 (four) hours as needed for wheezing or shortness of breath. 03/03/14  Yes Lajean Saver, MD  aspirin-acetaminophen-caffeine (EXCEDRIN MIGRAINE) [phone] MG tablet Take 1 tablet by mouth every 6 (six) hours as needed for headache.   Yes Historical Provider, MD  diltiazem (CARDIZEM SR) 90 MG 12 hr capsule 1 bid 10/16/14  Yes Hendricks Limes, MD  fluorouracil (EFUDEX) 5 % cream Apply 1 application topically daily.   Yes Historical Provider, MD  hydrochlorothiazide (MICROZIDE) 12.5 MG capsule Take 1 capsule (12.5 mg total) by mouth daily. 04/24/15  Yes Hendricks Limes, MD  SYMBICORT 160-4.5 MCG/ACT inhaler INHALE 2 PUFFS BY MOUTH TWICE DAILY 05/11/15  Yes Deneise Lever, MD  HYDROcodone-homatropine [facility]) 5-1.5 MG/5ML syrup Take 5 mLs by mouth every 8 (eight) hours as needed for cough. Patient not taking: Reported on  04/29/2015 04/21/15   Lonie Peak Dixon, PA-C  predniSONE (DELTASONE) 20 MG tablet 2 tabs po daily x 4 days 06/16/15   Deno Etienne, DO   BP 189/70 mmHg  Pulse 67  Temp(Src) 98.7 F (37.1 C) (Oral)  Resp 14  Ht 5' 7.5" (1.715 m)  Wt 155 lb (70.308 kg)  BMI 23.90 kg/m2  SpO2 99% Physical Exam  Constitutional: She is oriented to person, place, and time. She appears well-developed and well-nourished. No distress.  HENT:  Head: Normocephalic and atraumatic.  Eyes: EOM are normal. Pupils are equal, round, and reactive to  light.  Neck: Normal range of motion. Neck supple.  Cardiovascular: Normal rate and regular rhythm.  Exam reveals no gallop and no friction rub.   No murmur heard. Pulmonary/Chest: Effort normal. She has wheezes (diffuse expiratory). She has no rales.  Abdominal: Soft. She exhibits no distension. There is no tenderness. There is no rebound.  Musculoskeletal: She exhibits no edema or tenderness.  Neurological: She is alert and oriented to person, place, and time.  Skin: Skin is warm and dry. She is not diaphoretic.  Psychiatric: She has a normal mood and affect. Her behavior is normal.  Nursing note and vitals reviewed.   ED Course  Procedures (including critical care time) Labs Review Labs Reviewed - No data to display  Imaging Review Dg Chest 2 View  06/16/2015  CLINICAL DATA:  Acute onset of shortness of breath. Initial encounter. EXAM: CHEST  2 VIEW COMPARISON:  Chest radiograph performed 03/03/2014. FINDINGS: The lungs are hyperexpanded, with flattening of the hemidiaphragms, compatible with COPD. Minimal bilateral scarring is noted. There is no evidence of pleural effusion or pneumothorax. The heart is normal in size; the mediastinal contour is within normal limits. No acute osseous abnormalities are seen. IMPRESSION: Findings of COPD.  Lungs otherwise clear. Electronically Signed   By: Garald Balding M.D.   On: 06/16/2015 05:42   I have personally reviewed and evaluated these images and lab results as part of my medical decision-making.   EKG Interpretation   Date/Time:  Monday June 16 2015 04:41:52 EST Ventricular Rate:  85 PR Interval:  168 QRS Duration: 102 QT Interval:  397 QTC Calculation: 472 R Axis:   -67 Text Interpretation:  Sinus rhythm LAD, consider left anterior fascicular  block No significant change since last tracing Confirmed by Caven Perine MD,  DANIEL [id number]) on 06/16/2015 5:09:07 AM      MDM   Final diagnoses:  COPD exacerbation ([place])    70 yo F with  COPD exacerbation.  CXR negative. Symptoms significantly improved with one neb, requesting d/c home.  8:27 PM:  I have discussed the diagnosis/risks/treatment options with the patient and family and believe the pt to be eligible for discharge home to follow-up with PCP. We also discussed returning to the ED immediately if new or worsening sx occur. We discussed the sx which are most concerning (e.g., sudden worsening sob, fever) that necessitate immediate return. Medications administered to the patient during their visit and any new prescriptions provided to the patient are listed below.  Medications given during this visit Medications  albuterol (PROVENTIL) (2.5 MG/3ML) 0.083% nebulizer solution 5 mg (5 mg Nebulization Given 06/16/15 0522)  predniSONE (DELTASONE) tablet 60 mg (60 mg Oral Given 06/16/15 0635)  acetaminophen (TYLENOL) tablet 1,000 mg (1,000 mg Oral Given 06/16/15 0636)    Discharge Medication List as of 06/16/2015  7:09 AM    START taking these medications   Details  predniSONE (  DELTASONE) 20 MG tablet 2 tabs po daily x 4 days, Print        The patient appears reasonably screen and/or stabilized for discharge and I doubt any other medical condition or other [facility] requiring further screening, evaluation, or treatment in the ED at this time prior to discharge.      Deno Etienne, DO 06/16/15 2027

## 2015-06-23 ENCOUNTER — Encounter: Payer: Self-pay | Admitting: Family

## 2015-06-23 ENCOUNTER — Ambulatory Visit (INDEPENDENT_AMBULATORY_CARE_PROVIDER_SITE_OTHER): Payer: Medicare Other | Admitting: Family

## 2015-06-23 VITALS — BP 170/100 | HR 57 | Temp 97.6°F | Resp 18 | Ht 67.0 in | Wt 156.0 lb

## 2015-06-23 DIAGNOSIS — I10 Essential (primary) hypertension: Secondary | ICD-10-CM | POA: Diagnosis not present

## 2015-06-23 DIAGNOSIS — J441 Chronic obstructive pulmonary disease with (acute) exacerbation: Secondary | ICD-10-CM | POA: Insufficient documentation

## 2015-06-23 MED ORDER — LEVOFLOXACIN 500 MG PO TABS: 500.0000 mg | ORAL_TABLET | Freq: Every day | ORAL | Status: DC

## 2015-06-23 MED ORDER — PREDNISONE 10 MG (21) PO TBPK: ORAL_TABLET | ORAL | Status: DC

## 2015-06-23 MED ORDER — METHYLPREDNISOLONE ACETATE 80 MG/ML IJ SUSP
80.0000 mg | Freq: Once | INTRAMUSCULAR | Status: AC
  Administered 2015-06-23: 80 mg via INTRAMUSCULAR

## 2015-06-23 MED ORDER — IPRATROPIUM-ALBUTEROL 0.5-2.5 (3) MG/3ML IN SOLN
3.0000 mL | Freq: Once | RESPIRATORY_TRACT | Status: AC
  Administered 2015-06-23: 3 mL via RESPIRATORY_TRACT

## 2015-06-23 MED ORDER — ALBUTEROL SULFATE HFA 108 (90 BASE) MCG/ACT IN AERS: 2.0000 | INHALATION_SPRAY | RESPIRATORY_TRACT | Status: DC | PRN

## 2015-06-23 NOTE — Progress Notes (Signed)
Subjective:    Patient ID: Diane Patterson, female    DOB: 1946/04/27, 70 y.o.   MRN: [id number]  Chief Complaint  Patient presents with  . Hospitalization Follow-up    was in the hospital for asthma attack, has congestion in her chest which is causing her SOB also has COPD, would like something to help breathe better    HPI:  Diane Patterson is a 70 y.o. female who  has a past medical history of Tobacco abuse; Venous insufficiency; Chronic airflow obstruction ([place]); Hypertension; and Asthma. and presents today for an office visit following an ED.  1.) COPD - Recently evaluated in the Emergency Room for worsening shortness of breath for 2 days. She was diagnosed with COPD exacerbation and treated with prednisone following a breathing treatment. All ED records were reviewed in detail.   Continues to experiencing associated symptoms of cough, congestion, and body aches. Reports that the cough has worsened since leaving the hospital about 7 days ago. Has had increased amounts of mucus production and coughing. Reports occasional subjective fever. No modifying factors that make it better.   2.) Hypertension - Currently managed on hydrochlorothiazide and diltiazem. Takes the medication as prescribed and denies adverse side effects or hypotensive events. Denies symptoms of end organ damage.  BP Readings from Last 3 Encounters:  06/23/15 170/100  06/16/15 189/70  04/29/15 136/84    No Known Allergies   Current Outpatient Prescriptions on File Prior to Visit  Medication Sig Dispense Refill  . diltiazem (CARDIZEM SR) 90 MG 12 hr capsule 1 bid 180 capsule 3  . fluorouracil (EFUDEX) 5 % cream Apply 1 application topically daily.    . hydrochlorothiazide (MICROZIDE) 12.5 MG capsule Take 1 capsule (12.5 mg total) by mouth daily. 90 capsule 0  . HYDROcodone-homatropine (HYCODAN) 5-1.5 MG/5ML syrup Take 5 mLs by mouth every 8 (eight) hours as needed for cough. 120 mL 0  . SYMBICORT  160-4.5 MCG/ACT inhaler INHALE 2 PUFFS BY MOUTH TWICE DAILY 10.2 g 11   No current facility-administered medications on file prior to visit.     Past Surgical History  Procedure Laterality Date  . Vesicovaginal fistula closure w/ tah    . Neck surgery    . Cataract    . No colonoscopy      "afraid to "; [facility] reviewed    Past Medical History  Diagnosis Date  . Tobacco abuse   . Venous insufficiency   . Chronic airflow obstruction (HCC)   . Hypertension   . Asthma       Review of Systems  Constitutional: Positive for fever (Subjective). Negative for chills.  HENT: Positive for congestion. Negative for sinus pressure.   Respiratory: Positive for cough and wheezing.   Neurological: Negative for headaches.      Objective:    BP 170/100 mmHg  Pulse 57  Temp(Src) 97.6 F (36.4 C) (Oral)  Resp 18  Ht 5\' 7"  (1.702 m)  Wt 156 lb (70.761 kg)  BMI 24.43 kg/m2  SpO2 95% Nursing note and vital signs reviewed.  Physical Exam  Constitutional: She is oriented to person, place, and time. She appears well-developed and well-nourished. No distress.  HENT:  Right Ear: Hearing, tympanic membrane, external ear and ear canal normal.  Left Ear: Hearing, tympanic membrane, external ear and ear canal normal.  Nose: Nose normal. Right sinus exhibits no maxillary sinus tenderness and no frontal sinus tenderness. Left sinus exhibits no maxillary sinus tenderness and no frontal sinus tenderness.  Mouth/Throat: Uvula is midline, oropharynx is clear and moist and mucous membranes are normal.  Cardiovascular: Normal rate, regular rhythm, normal heart sounds and intact distal pulses.   Pulmonary/Chest: Effort normal. She has wheezes.  Neurological: She is alert and oriented to person, place, and time.  Skin: Skin is warm and dry.  Psychiatric: She has a normal mood and affect. Her behavior is normal. Judgment and thought content normal.       Assessment & Plan:   Problem List Items  Addressed This Visit      Cardiovascular and Mediastinum   Essential hypertension     Blood pressure remains elevated above goal 140/90 with current regimen. Most likely elevated today secondary to illness symptoms and shortness of breath. Continue current dosage of hydrochlorothiazide and diltiazem. Follow-up in one month or sooner if blood pressure continues to remain elevated at home or symptoms of end organ damage are noted.        Respiratory   COPD exacerbation ([place]) - Primary    Symptoms and exam consistent with COPD exacerbation given increased cough and increased mucus production. In office Duoneb treatment and depomedrol injection provided with some improvement. Start levofloxacin. Prednisone taper provided if symptoms do not improve. Continue current dosage of Symbicort and albuterol. Follow up if symptoms worsen or do not improve.       Relevant Medications   levofloxacin (LEVAQUIN) 500 MG tablet   albuterol (PROVENTIL HFA;VENTOLIN HFA) 108 (90 Base) MCG/ACT inhaler   predniSONE (STERAPRED UNI-PAK 21 TAB) 10 MG (21) TBPK tablet   methylPREDNISolone acetate (DEPO-MEDROL) injection 80 mg (Completed)   ipratropium-albuterol (DUONEB) 0.5-2.5 (3) MG/3ML nebulizer solution 3 mL (Completed)

## 2015-06-23 NOTE — Progress Notes (Signed)
Pre visit review using our clinic review tool, if applicable. No additional management support is needed unless otherwise documented below in the visit note. 

## 2015-06-23 NOTE — Assessment & Plan Note (Signed)
Blood pressure remains elevated above goal 140/90 with current regimen. Most likely elevated today secondary to illness symptoms and shortness of breath. Continue current dosage of hydrochlorothiazide and diltiazem. Follow-up in one month or sooner if blood pressure continues to remain elevated at home or symptoms of end organ damage are noted.

## 2015-06-23 NOTE — Patient Instructions (Signed)
Thank you for choosing [facility].  Summary/Instructions:  Your prescription(s) have been submitted to your pharmacy or been printed and provided for you. Please take as directed and contact our office if you believe you are having problem(s) with the medication(s) or have any questions.  If your symptoms worsen or fail to improve, please contact our office for further instruction, or in case of emergency go directly to the emergency room at the closest medical facility.   Please continue to take your medications as prescribed.   Please start / complete taking the Levofloxacin.   If your symptoms do not improve please let us know.   Chronic Obstructive Pulmonary Disease Chronic obstructive pulmonary disease (COPD) is a common lung condition in which airflow from the lungs is limited. COPD is a general term that can be used to describe many different lung problems that limit airflow, including both chronic bronchitis and emphysema. If you have COPD, your lung function will probably never return to normal, but there are measures you can take to improve lung function and make yourself feel better. CAUSES   Smoking (common).  Exposure to secondhand smoke.  Genetic problems.  Chronic inflammatory lung diseases or recurrent infections. SYMPTOMS  Shortness of breath, especially with physical activity.  Deep, persistent (chronic) cough with a large amount of thick mucus.  Wheezing.  Rapid breaths (tachypnea).  Gray or bluish discoloration (cyanosis) of the skin, especially in your fingers, toes, or lips.  Fatigue.  Weight loss.  Frequent infections or episodes when breathing symptoms become much worse (exacerbations).  Chest tightness. DIAGNOSIS Your health care provider will take a medical history and perform a physical examination to diagnose COPD. Additional tests for COPD may include:  Lung (pulmonary) function tests.  Chest X-ray.  CT scan.  Blood  tests. TREATMENT  Treatment for COPD may include:  Inhaler and nebulizer medicines. These help manage the symptoms of COPD and make your breathing more comfortable.  Supplemental oxygen. Supplemental oxygen is only helpful if you have a low oxygen level in your blood.  Exercise and physical activity. These are beneficial for nearly all people with COPD.  Lung surgery or transplant.  Nutrition therapy to gain weight, if you are underweight.  Pulmonary rehabilitation. This may involve working with a team of health care providers and specialists, such as respiratory, occupational, and physical therapists. HOME CARE INSTRUCTIONS  Take all medicines (inhaled or pills) as directed by your health care provider.  Avoid over-the-counter medicines or cough syrups that dry up your airway (such as antihistamines) and slow down the elimination of secretions unless instructed otherwise by your health care provider.  If you are a smoker, the most important thing that you can do is stop smoking. Continuing to smoke will cause further lung damage and breathing trouble. Ask your health care provider for help with quitting smoking. He or she can direct you to community resources or hospitals that provide support.  Avoid exposure to irritants such as smoke, chemicals, and fumes that aggravate your breathing.  Use oxygen therapy and pulmonary rehabilitation if directed by your health care provider. If you require home oxygen therapy, ask your health care provider whether you should purchase a pulse oximeter to measure your oxygen level at home.  Avoid contact with individuals who have a contagious illness.  Avoid extreme temperature and humidity changes.  Eat healthy foods. Eating smaller, more frequent meals and resting before meals may help you maintain your strength.  Stay active, but balance activity with  periods of rest. Exercise and physical activity will help you maintain your ability to do things  you want to do.  Preventing infection and hospitalization is very important when you have COPD. Make sure to receive all the vaccines your health care provider recommends, especially the pneumococcal and influenza vaccines. Ask your health care provider whether you need a pneumonia vaccine.  Learn and use relaxation techniques to manage stress.  Learn and use controlled breathing techniques as directed by your health care provider. Controlled breathing techniques include:  Pursed lip breathing. Start by breathing in (inhaling) through your nose for 1 second. Then, purse your lips as if you were going to whistle and breathe out (exhale) through the pursed lips for 2 seconds.  Diaphragmatic breathing. Start by putting one hand on your abdomen just above your waist. Inhale slowly through your nose. The hand on your abdomen should move out. Then purse your lips and exhale slowly. You should be able to feel the hand on your abdomen moving in as you exhale.  Learn and use controlled coughing to clear mucus from your lungs. Controlled coughing is a series of short, progressive coughs. The steps of controlled coughing are: 1. Lean your head slightly forward. 2. Breathe in deeply using diaphragmatic breathing. 3. Try to hold your breath for 3 seconds. 4. Keep your mouth slightly open while coughing twice. 5. Spit any mucus out into a tissue. 6. Rest and repeat the steps once or twice as needed. SEEK MEDICAL CARE IF:  You are coughing up more mucus than usual.  There is a change in the color or thickness of your mucus.  Your breathing is more labored than usual.  Your breathing is faster than usual. SEEK IMMEDIATE MEDICAL CARE IF:  You have shortness of breath while you are resting.  You have shortness of breath that prevents you from:  Being able to talk.  Performing your usual physical activities.  You have chest pain lasting longer than 5 minutes.  Your skin color is more cyanotic  than usual.  You measure low oxygen saturations for longer than 5 minutes with a pulse oximeter. MAKE SURE YOU:  Understand these instructions.  Will watch your condition.  Will get help right away if you are not doing well or get worse.   This information is not intended to replace advice given to you by your health care provider. Make sure you discuss any questions you have with your health care provider.   Document Released: 03/10/2005 Document Revised: 06/21/2014 Document Reviewed: 01/25/2013 Elsevier Interactive Patient Education [employer].

## 2015-06-23 NOTE — Assessment & Plan Note (Signed)
Symptoms and exam consistent with COPD exacerbation given increased cough and increased mucus production. In office Duoneb treatment and depomedrol injection provided with some improvement. Start levofloxacin. Prednisone taper provided if symptoms do not improve. Continue current dosage of Symbicort and albuterol. Follow up if symptoms worsen or do not improve.

## 2015-07-03 ENCOUNTER — Telehealth: Payer: Self-pay | Admitting: *Deleted

## 2015-07-03 NOTE — Telephone Encounter (Signed)
Diane Patterson is out of the office pls advise on pt request...../lmb

## 2015-07-03 NOTE — Telephone Encounter (Signed)
See how much the antibiotic helped. She may still have mucus production, but may not necessarily have an infection-and antibiotic may not help. What else is she doing to help with the mucus/phlegm.

## 2015-07-03 NOTE — Telephone Encounter (Signed)
Received call pt stated she completed the Levaquin that was rx but the phlegm is going down into her chest now. She stated Marya Amsler told her to call back if med didn't help sxs...Johny Chess

## 2015-07-03 NOTE — Telephone Encounter (Signed)
Called pt gave her md response. Pt states at first phlegm wasn't in her chest. She is coughing up yellowish mucus. Denies any other symptoms. She states mucus in hard to bring up too...Diane Patterson

## 2015-07-04 ENCOUNTER — Ambulatory Visit: Payer: Medicare Other | Admitting: Nurse Practitioner

## 2015-07-04 MED ORDER — LEVOFLOXACIN 500 MG PO TABS: 500.0000 mg | ORAL_TABLET | Freq: Every day | ORAL | Status: DC

## 2015-07-04 NOTE — Telephone Encounter (Signed)
Lets give an additional 7 days of the levaquin.  If her symptoms do not improve she should come in. rx sent to pharmacy

## 2015-07-04 NOTE — Telephone Encounter (Signed)
Called pt no answer LMOM rx sent to pharmacy.../lmb 

## 2015-07-15 ENCOUNTER — Other Ambulatory Visit: Payer: Self-pay | Admitting: Family Medicine

## 2015-07-15 ENCOUNTER — Ambulatory Visit (INDEPENDENT_AMBULATORY_CARE_PROVIDER_SITE_OTHER): Payer: Medicare Other | Admitting: Family Medicine

## 2015-07-15 ENCOUNTER — Encounter: Payer: Self-pay | Admitting: Family Medicine

## 2015-07-15 ENCOUNTER — Telehealth: Payer: Self-pay | Admitting: Family

## 2015-07-15 VITALS — BP 146/84 | HR 78 | Temp 98.7°F | Resp 20 | Ht 67.0 in | Wt 151.0 lb

## 2015-07-15 DIAGNOSIS — R509 Fever, unspecified: Secondary | ICD-10-CM | POA: Diagnosis not present

## 2015-07-15 DIAGNOSIS — J441 Chronic obstructive pulmonary disease with (acute) exacerbation: Secondary | ICD-10-CM | POA: Diagnosis not present

## 2015-07-15 LAB — INFLUENZA A AND B AG, IMMUNOASSAY
INFLUENZA A ANTIGEN: NOT DETECTED
Influenza B Antigen: NOT DETECTED

## 2015-07-15 MED ORDER — PREDNISONE 20 MG PO TABS
ORAL_TABLET | ORAL | Status: DC
Start: 2015-07-15 — End: 2015-07-30

## 2015-07-15 NOTE — Telephone Encounter (Signed)
Noted! Thank you

## 2015-07-15 NOTE — Patient Instructions (Signed)
Take mucinex Prednisone as prescribed  Continue inhalers  If you get worse go to ER  Choose if you which doctors office you will be going to!

## 2015-07-15 NOTE — Progress Notes (Signed)
Patient ID: Diane Patterson, female   DOB: 02/26/1946, 70 y.o.   MRN: [id number]   Subjective:    Patient ID: Diane Patterson, female    DOB: 1945-12-24, 70 y.o.   MRN: [id number]  Patient presents for Illness patient here with cough with congestion and wheezing she states that she thinks she has the flu. She is actually a patient at [facility] she was seeing Dr. Linna Darner until he retired. She lives by our clinic here and on occasion she will come to this office. Note I discussed with her that she can only have one primary care provider and this keeps errors in confusion and she will have one provider that knows her very well. At any rate she was seen in the emergency room on January 2 at that time diagnosed with COPD exacerbation chest x-ray was negative she was given prednisone she presented to her primary care provider's office on the 19th at that time she was given another course of steroids as well as Levaquin. She called back a week later and was given another 7 days of Levaquin which she just completed. She only took Mucinex for a couple of days. She also has Hycodan cough syrup. Which she only took a few days. She has albuterol at home and has access to nebulizer medicine she is also on Symbicort. She does have a pulmonologist she is not on oxygen therapy. She denies any fever but states that she had some chills. She states that she and her husband have been passing the "flu" back-and-forth between each other the past few weeks??    Review Of Systems:  GEN- denies fatigue,+ fever, weight loss,weakness, recent illness HEENT- denies eye drainage, change in vision, nasal discharge, CVS- denies chest pain, palpitations RESP- + SOB,+ cough, +wheeze ABD- denies N/V, change in stools, abd pain GU- denies dysuria, hematuria, dribbling, incontinence MSK- denies joint pain, muscle aches, injury Neuro- denies headache, dizziness, syncope, seizure activity       Objective:    BP  146/84 mmHg  Pulse 78  Temp(Src) 98.7 F (37.1 C) (Oral)  Resp 20  Ht 5\' 7"  (1.702 m)  Wt 151 lb (68.493 kg)  BMI 23.64 kg/m2  SpO2 94% GEN- NAD, alert and oriented x3, not ill-appearing HEENT- PERRL, EOMI, non injected sclera, pink conjunctiva, MMM, oropharynx clear Neck- Supple, no LAD CVS- RRR, no murmur RESP-scattered wheeze, normal WOB, no rales, no rhonchi, no retractions speaking in full sentences EXT- No edema Pulses- Radial, DP- 2+   Flu- NEGATIVE      Assessment & Plan:      Problem List Items Addressed This Visit    COPD exacerbation ([place])    I think his history of COPD. She asked she seems to be improving based on the previous notes. Her chest is fairly clear and she is not having difficulty breathing. I query her understanding of her disease. I will have her continue the albuterol. As she has still having chest tightness and wheeze I given her another few days of prednisone. She will not receive any further antibiotics. I've recommended that she follow-up with 1 provider she has to come to our office for all of her care with the exception of her specialists or she needs to stick with [facility] at [facility]. I recommended that she take Mucinex twice a day as well. She has all for other medications. She does not improve for still having trouble she needs to seek care at the emergency room  she has had multiple rounds of antibiotics and prednisone as an outpatient. I see no reason to send her to the ER today.      Relevant Medications   predniSONE (DELTASONE) 20 MG tablet    Other Visit Diagnoses    Fever, unspecified    -  Primary    Relevant Orders    Influenza a and b       Note: This dictation was prepared with Dragon dictation along with smaller phrase technology. Any transcriptional errors that result from this process are unintentional.

## 2015-07-15 NOTE — Assessment & Plan Note (Signed)
I think his history of COPD. She asked she seems to be improving based on the previous notes. Her chest is fairly clear and she is not having difficulty breathing. I query her understanding of her disease. I will have her continue the albuterol. As she has still having chest tightness and wheeze I given her another few days of prednisone. She will not receive any further antibiotics. I've recommended that she follow-up with 1 provider she has to come to our office for all of her care with the exception of her specialists or she needs to stick with [facility] at [facility]. I recommended that she take Mucinex twice a day as well. She has all for other medications. She does not improve for still having trouble she needs to seek care at the emergency room she has had multiple rounds of antibiotics and prednisone as an outpatient. I see no reason to send her to the ER today.

## 2015-07-15 NOTE — Telephone Encounter (Signed)
Patient called to advise that she believes she has the flu. Rejected seeing another provider due to access. States that she was told to call in if she hasn't gotten any better from her last visit. It shows that the has an appt today at [facility] @ 12:00, but she states that she is cancelling this appointment. i advised that i would send you the information.

## 2015-07-23 ENCOUNTER — Encounter: Payer: Self-pay | Admitting: Cardiology

## 2015-07-30 ENCOUNTER — Ambulatory Visit (INDEPENDENT_AMBULATORY_CARE_PROVIDER_SITE_OTHER): Payer: Medicare Other | Admitting: Family

## 2015-07-30 VITALS — BP 154/96 | HR 86 | Temp 98.5°F | Resp 16 | Wt 150.0 lb

## 2015-07-30 DIAGNOSIS — L02216 Cutaneous abscess of umbilicus: Secondary | ICD-10-CM | POA: Diagnosis not present

## 2015-07-30 MED ORDER — CEPHALEXIN 500 MG PO CAPS: 500.0000 mg | ORAL_CAPSULE | Freq: Three times a day (TID) | ORAL | Status: DC

## 2015-07-30 NOTE — Assessment & Plan Note (Signed)
No evidence of hernia. Small scab noted and removed from umbilicus with improvement of pain. Most likely an abscess. Basic wound care with soap and water. Start Keflex if symptoms do not improve. Follow up as needed.

## 2015-07-30 NOTE — Progress Notes (Signed)
   Subjective:    Patient ID: Diane Patterson, female    DOB: [birth year], 70 y.o.   MRN: [id number]  Chief Complaint  Patient presents with  . Follow-up    Hernia in Belly-button xs 5 years. Pt has put off coming in to be seen about it. Has gotten worse recently. Causes stomach pain daily.     HPI:  Diane Patterson is a 70 y.o. female who  has a past medical history of Tobacco abuse; Venous insufficiency; Chronic airflow obstruction ([place]); Hypertension; and Asthma. and presents today for a office visit.   Associated symptom of pain located around her naval has been going on for about 5 years. Pain is described as sharp on occasion and worsened to the touch. Pain is constant. Modifying factors include lying down and Tylenol which help. No aggravation with coughing, sneezing or increased intrathoracic pressure.   No Known Allergies   Current Outpatient Prescriptions on File Prior to Visit  Medication Sig Dispense Refill  . albuterol (PROVENTIL HFA;VENTOLIN HFA) 108 (90 Base) MCG/ACT inhaler Inhale 2 puffs into the lungs every 4 (four) hours as needed for wheezing or shortness of breath. 1 Inhaler 3  . diltiazem (CARDIZEM SR) 90 MG 12 hr capsule 1 bid 180 capsule 3  . fluorouracil (EFUDEX) 5 % cream Apply 1 application topically daily.    . hydrochlorothiazide (MICROZIDE) 12.5 MG capsule Take 1 capsule (12.5 mg total) by mouth daily. 90 capsule 0  . SYMBICORT 160-4.5 MCG/ACT inhaler INHALE 2 PUFFS BY MOUTH TWICE DAILY 10.2 g 11   No current facility-administered medications on file prior to visit.    Review of Systems  Constitutional: Negative for fever and chills.  Gastrointestinal: Negative for nausea, vomiting, abdominal pain, diarrhea and constipation.       Positive for umbilical pain.      Objective:    BP 154/96 mmHg  Pulse 86  Temp(Src) 98.5 F (36.9 C) (Oral)  Resp 16  Wt 150 lb (68.04 kg)  SpO2 94% Nursing note and vital signs reviewed.  Physical Exam    Constitutional: She is oriented to person, place, and time. She appears well-developed and well-nourished. No distress.  Cardiovascular: Normal rate, regular rhythm, normal heart sounds and intact distal pulses.   Pulmonary/Chest: Effort normal and breath sounds normal.  Abdominal: Soft. Normal appearance and bowel sounds are normal. There is no hepatosplenomegaly. There is no rigidity, no guarding, no tenderness at McBurney's point and negative Murphy's sign. No hernia. Hernia confirmed negative in the ventral area.    Neurological: She is alert and oriented to person, place, and time.  Skin: Skin is warm and dry.  Psychiatric: She has a normal mood and affect. Her behavior is normal. Judgment and thought content normal.       Assessment & Plan:   Problem List Items Addressed This Visit      Other   Abscess of umbilicus - Primary    No evidence of hernia. Small scab noted and removed from umbilicus with improvement of pain. Most likely an abscess. Basic wound care with soap and water. Start Keflex if symptoms do not improve. Follow up as needed.       Relevant Medications   cephALEXin (KEFLEX) 500 MG capsule

## 2015-07-30 NOTE — Patient Instructions (Signed)
Thank you for choosing [facility].  Summary/Instructions:  Keep the area clean with soap and water. If symptoms worsen, start the antibiotic.   If your symptoms worsen or fail to improve, please contact our office for further instruction, or in case of emergency go directly to the emergency room at the closest medical facility.

## 2015-07-30 NOTE — Progress Notes (Signed)
Pre visit review using our clinic review tool, if applicable. No additional management support is needed unless otherwise documented below in the visit note. 

## 2015-07-31 ENCOUNTER — Encounter: Payer: Self-pay | Admitting: Cardiology

## 2015-07-31 ENCOUNTER — Ambulatory Visit (INDEPENDENT_AMBULATORY_CARE_PROVIDER_SITE_OTHER): Payer: Medicare Other | Admitting: Cardiology

## 2015-07-31 ENCOUNTER — Encounter (INDEPENDENT_AMBULATORY_CARE_PROVIDER_SITE_OTHER): Payer: Self-pay

## 2015-07-31 VITALS — BP 142/90 | HR 92 | Ht 67.5 in | Wt 150.0 lb

## 2015-07-31 DIAGNOSIS — R079 Chest pain, unspecified: Secondary | ICD-10-CM | POA: Diagnosis not present

## 2015-07-31 DIAGNOSIS — I10 Essential (primary) hypertension: Secondary | ICD-10-CM

## 2015-07-31 DIAGNOSIS — I444 Left anterior fascicular block: Secondary | ICD-10-CM | POA: Diagnosis not present

## 2015-07-31 DIAGNOSIS — J438 Other emphysema: Secondary | ICD-10-CM | POA: Diagnosis not present

## 2015-07-31 DIAGNOSIS — R55 Syncope and collapse: Secondary | ICD-10-CM

## 2015-07-31 NOTE — Progress Notes (Signed)
Cardiology Office Note    Date:  07/31/2015   ID:  Diane, Patterson 1945/12/24, MRN [id number]  PCP:  Mauricio Po, FNP  Cardiologist:   Candee Furbish, MD     History of Present Illness:  Diane Patterson is a 70 y.o. female with COPD, asthma, hypertension here for cardiac evaluation.  Over the Christmas holidays, she had a horrible upper respiratory infection, was seen in the emergency department. Was hospitalized. Prior to this hospitalization, while sitting on the couch, her vision turned black. That night she had very sharp left-sided chest discomfort that was severe. She laid in bed very still for about 3 minutes and then it finally subsided. Worried her. Last week, while watching television, her vision once again turned black. She reports that she is not had any issues with low blood pressure in the past. They have been however trying to adjust her hypertension regimen. She bruises quite easily.  EKG from 06/17/15 shows sinus rhythm and left anterior fascicular block. Went to ER with SOB.     Past Medical History  Diagnosis Date  . Tobacco abuse   . Venous insufficiency   . Chronic airflow obstruction (HCC)   . Hypertension   . Asthma     Past Surgical History  Procedure Laterality Date  . Vesicovaginal fistula closure w/ tah    . Neck surgery    . Cataract    . No colonoscopy      "afraid to "; [facility] reviewed    Outpatient Prescriptions Prior to Visit  Medication Sig Dispense Refill  . albuterol (PROVENTIL HFA;VENTOLIN HFA) 108 (90 Base) MCG/ACT inhaler Inhale 2 puffs into the lungs every 4 (four) hours as needed for wheezing or shortness of breath. 1 Inhaler 3  . fluorouracil (EFUDEX) 5 % cream Apply 1 application topically daily.    . hydrochlorothiazide (MICROZIDE) 12.5 MG capsule Take 1 capsule (12.5 mg total) by mouth daily. 90 capsule 0  . SYMBICORT 160-4.5 MCG/ACT inhaler INHALE 2 PUFFS BY MOUTH TWICE DAILY 10.2 g 11  . cephALEXin (KEFLEX) 500 MG  capsule Take 1 capsule (500 mg total) by mouth 3 (three) times daily. 21 capsule 0  . diltiazem (CARDIZEM SR) 90 MG 12 hr capsule 1 bid 180 capsule 3   No facility-administered medications prior to visit.     Allergies:   Review of patient's allergies indicates no known allergies.   Social History   Social History  . Marital Status: Married    Spouse Name: N/A  . Number of Children: 1  . Years of Education: N/A   Social History Main Topics  . Smoking status: Former Smoker -- 0.75 packs/day for 15 years    Types: Cigarettes    Quit date: 03/20/2014  . Smokeless tobacco: Former Systems developer    Quit date: 03/11/2014  . Alcohol Use: No  . Drug Use: No  . Sexual Activity: Not Asked   Other Topics Concern  . None   Social History Narrative     Family History:  The patient's family history includes Asthma in her father; Diabetes in her mother; Emphysema in her father; Heart disease in her father.   ROS:   Please see the history of present illness.    ROS shortness of breath, excessive fatigue, easy bruising All other systems reviewed and are negative.   PHYSICAL EXAM:   VS:  BP 142/90 mmHg  Pulse 92  Ht 5' 7.5" (1.715 m)  Wt 150 lb (68.04 kg)  BMI 23.13 kg/m2  SpO2 93%   GEN: Well nourished, well developed, in no acute distress HEENT: normal Neck: no JVD, carotid bruits, or masses Cardiac: RRR; no murmurs, rubs, or gallops,no edema  Respiratory:  Poor air movement. No wheezes. GI: soft, nontender, nondistended, + BS MS: no deformity or atrophy Skin: warm and dry, no rash Neuro:  Alert and Oriented x 3, Strength and sensation are intact Psych: euthymic mood, full affect  Wt Readings from Last 3 Encounters:  07/31/15 150 lb (68.04 kg)  07/30/15 150 lb (68.04 kg)  07/15/15 151 lb (68.493 kg)      Studies/Labs Reviewed:   EKG:  EKG is not ordered today.    Recent Labs: No results found for requested labs within last 365 days.   Lipid Panel    Component Value  Date/Time   CHOL 203* 02/10/2010 1017   TRIG 138.0 02/10/2010 1017   HDL 55.10 02/10/2010 1017   CHOLHDL 4 02/10/2010 1017   VLDL 27.6 02/10/2010 1017   LDLDIRECT 136.0 02/10/2010 1017    Additional studies/ records that were reviewed today include:  Prior office notes reviewed, lab work reviewed Chest x-ray shows hyperinflation consistent with COPD   ASSESSMENT:    1. Near syncope   2. Chest pain, unspecified chest pain type   3. LAFB (left anterior fascicular block)   4. Other emphysema ([place])   5. Essential hypertension      PLAN:  In order of problems listed above:  1. Atypical chest pain-we will check a nuclear stress test, pharmacologic. Her brother had a heart attack during a treadmill stress test so she is anxious about this. With sharp pain lasting 3 minutes across her left chest wall, this is likely musculoskeletal in etiology however given her extensive prior smoking history, we will check stress test. 2. Near syncope-vision blackout. Could be from hypotension transiently. No other constellation of vagal-like symptoms. Could be heart arrhythmia. We will go and check an event monitor. 3. COPD-poor air movement noted on exam. Inhalers. Dr. Elsworth Soho with pulmonary monitoring. 4. Essential hypertension-currently borderline elevated. 5. 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    Medication Adjustments/Labs and Tests Ordered: Current medicines are reviewed at length with the patient today.  Concerns regarding medicines are outlined above.  Medication changes, Labs and Tests ordered today are listed in the Patient Instructions below. Patient Instructions  Medication Instructions:  The current medical regimen is effective;  continue present plan and medications.  Testing/Procedures: Your physician has requested that you have a lexiscan myoview. For further information please visit [email or web address]. Please follow instruction sheet, as given.  Your physician has  recommended that you wear an event monitor. Event monitors are medical devices that record the heart's electrical activity. Doctors most often [place] these monitors to diagnose arrhythmias. Arrhythmias are problems with the speed or rhythm of the heartbeat. The monitor is a small, portable device. You can wear one while you do your normal daily activities. This is usually used to diagnose what is causing palpitations/syncope (passing out).  Follow-Up: Follow up in 6 weeks with Dr Marlou Porch.  If you need a refill on your cardiac medications before your next appointment, please call your pharmacy.  Thank you for choosing [facility]!!             Signed, Candee Furbish, MD  07/31/2015 11:56 AM    [facility] [address], [place], [place]  [address] Phone: [phone]; Fax: [phone]

## 2015-07-31 NOTE — Patient Instructions (Signed)
Medication Instructions:  The current medical regimen is effective;  continue present plan and medications.  Testing/Procedures: Your physician has requested that you have a lexiscan myoview. For further information please visit [email or web address]. Please follow instruction sheet, as given.  Your physician has recommended that you wear an event monitor. Event monitors are medical devices that record the heart's electrical activity. Doctors most often [place] these monitors to diagnose arrhythmias. Arrhythmias are problems with the speed or rhythm of the heartbeat. The monitor is a small, portable device. You can wear one while you do your normal daily activities. This is usually used to diagnose what is causing palpitations/syncope (passing out).  Follow-Up: Follow up in 6 weeks with Dr Marlou Porch.  If you need a refill on your cardiac medications before your next appointment, please call your pharmacy.  Thank you for choosing [facility]!!

## 2015-08-07 ENCOUNTER — Telehealth (HOSPITAL_COMMUNITY): Payer: Self-pay | Admitting: *Deleted

## 2015-08-07 NOTE — Telephone Encounter (Signed)
Left message on voicemail per DPR in reference to upcoming appointment scheduled on 08/12/15 with detailed instructions given per Myocardial Perfusion Study Information Sheet for the test. LM to arrive 15 minutes early, and that it is imperative to arrive on time for appointment to keep from having the test rescheduled. If you need to cancel or reschedule your appointment, please call the office within 24 hours of your appointment. Failure to do so may result in a cancellation of your appointment, and a $50 no show fee. Phone number given for call back for any questions. Hubbard Robinson, RN

## 2015-08-12 ENCOUNTER — Ambulatory Visit (INDEPENDENT_AMBULATORY_CARE_PROVIDER_SITE_OTHER): Payer: Medicare Other

## 2015-08-12 ENCOUNTER — Ambulatory Visit (HOSPITAL_COMMUNITY): Payer: Medicare Other | Attending: Cardiovascular Disease

## 2015-08-12 DIAGNOSIS — R55 Syncope and collapse: Secondary | ICD-10-CM

## 2015-08-12 DIAGNOSIS — R079 Chest pain, unspecified: Secondary | ICD-10-CM | POA: Diagnosis not present

## 2015-08-12 DIAGNOSIS — I10 Essential (primary) hypertension: Secondary | ICD-10-CM | POA: Insufficient documentation

## 2015-08-12 LAB — MYOCARDIAL PERFUSION IMAGING
CHL CUP NUCLEAR SDS: 3
CHL CUP NUCLEAR SRS: 7
CHL CUP RESTING HR STRESS: 63 {beats}/min
LV dias vol: 60 mL
LV sys vol: 16 mL
NUC STRESS TID: 1.09
Peak HR: 104 {beats}/min
RATE: 0.28
SSS: 10

## 2015-08-12 MED ORDER — TECHNETIUM TC 99M SESTAMIBI GENERIC - CARDIOLITE
10.8000 | Freq: Once | INTRAVENOUS | Status: AC | PRN
  Administered 2015-08-12: 11 via INTRAVENOUS

## 2015-08-12 MED ORDER — TECHNETIUM TC 99M SESTAMIBI GENERIC - CARDIOLITE
32.6000 | Freq: Once | INTRAVENOUS | Status: AC | PRN
  Administered 2015-08-12: 33 via INTRAVENOUS

## 2015-08-12 MED ORDER — REGADENOSON 0.4 MG/5ML IV SOLN
0.4000 mg | Freq: Once | INTRAVENOUS | Status: AC
Start: 2015-08-12 — End: 2015-08-12
  Administered 2015-08-12: 0.4 mg via INTRAVENOUS

## 2015-08-14 ENCOUNTER — Telehealth: Payer: Self-pay | Admitting: Family

## 2015-08-14 NOTE — Telephone Encounter (Signed)
Patient believes she has the same as last time she saw you. She is wondering if you can call her in some prednisone.  [facility] on [address]

## 2015-08-15 MED ORDER — PREDNISONE 20 MG PO TABS
20.0000 mg | ORAL_TABLET | Freq: Two times a day (BID) | ORAL | Status: DC
Start: 2015-08-15 — End: 2015-08-22

## 2015-08-15 NOTE — Telephone Encounter (Signed)
Medication sent.

## 2015-08-15 NOTE — Telephone Encounter (Signed)
Notified patient.

## 2015-08-15 NOTE — Telephone Encounter (Signed)
Please Advise

## 2015-08-15 NOTE — Telephone Encounter (Signed)
Patient called back in regards.  °

## 2015-08-18 ENCOUNTER — Telehealth: Payer: Self-pay | Admitting: Cardiology

## 2015-08-18 NOTE — Telephone Encounter (Signed)
New message ° ° ° ° ° °Returning a call to the nurse to get stress test results °

## 2015-08-18 NOTE — Telephone Encounter (Signed)
PT AWARE OF MYOVIEW RESULTS./CY 

## 2015-08-20 ENCOUNTER — Ambulatory Visit: Payer: Medicare Other | Admitting: Pulmonary Disease

## 2015-08-22 ENCOUNTER — Ambulatory Visit (INDEPENDENT_AMBULATORY_CARE_PROVIDER_SITE_OTHER): Payer: Medicare Other | Admitting: Nurse Practitioner

## 2015-08-22 ENCOUNTER — Encounter: Payer: Self-pay | Admitting: Nurse Practitioner

## 2015-08-22 VITALS — BP 140/82 | HR 95 | Temp 98.2°F | Resp 18 | Ht 67.0 in | Wt 150.0 lb

## 2015-08-22 DIAGNOSIS — J441 Chronic obstructive pulmonary disease with (acute) exacerbation: Secondary | ICD-10-CM | POA: Diagnosis not present

## 2015-08-22 MED ORDER — PREDNISONE 20 MG PO TABS: 20.0000 mg | ORAL_TABLET | Freq: Two times a day (BID) | ORAL | Status: DC

## 2015-08-22 MED ORDER — DOXYCYCLINE HYCLATE 100 MG PO TABS: 100.0000 mg | ORAL_TABLET | Freq: Two times a day (BID) | ORAL | Status: DC

## 2015-08-22 NOTE — Patient Instructions (Addendum)
Doxycyline as prescribed- read the information on the bottle  Lots of water Mucinex Plain for the mucous

## 2015-08-22 NOTE — Progress Notes (Signed)
Pre visit review using our clinic review tool, if applicable. No additional management support is needed unless otherwise documented below in the visit note. 

## 2015-08-22 NOTE — Progress Notes (Signed)
Patient ID: Diane Patterson, female    DOB: [birth year]  Age: 70 y.o. MRN: [id number]  CC: No chief complaint on file.   HPI Diane Patterson presents for CC of feeling SOB.   1) Pt has COPD with recent exacerbations in Jan.  Pt has been on prednisone recently called in to her pharmacy by her PCP. Finished and feels somewhat improved.   Denies fevers/chills/sweats   Pt using inhalers daily, albuterol last night Denies having a nebulizer Sees Dr. Elsworth Soho   Denies coughing up anything   History Fareeha has a past medical history of Tobacco abuse; Venous insufficiency; Chronic airflow obstruction ([place]); Hypertension; and Asthma.   She has past surgical history that includes Vesicovaginal fistula closure w/ TAH; Neck surgery; cataract; and no colonoscopy.   Her family history includes Asthma in her father; Diabetes in her mother; Emphysema in her father; Heart disease in her father.She reports that she quit smoking about 17 months ago. Her smoking use included Cigarettes. She has a 11.25 pack-year smoking history. She quit smokeless tobacco use about 17 months ago. She reports that she does not drink alcohol or use illicit drugs.  Outpatient Prescriptions Prior to Visit  Medication Sig Dispense Refill  . albuterol (PROVENTIL HFA;VENTOLIN HFA) 108 (90 Base) MCG/ACT inhaler Inhale 2 puffs into the lungs every 4 (four) hours as needed for wheezing or shortness of breath. 1 Inhaler 3  . diltiazem (CARDIZEM) 90 MG tablet Take 90 mg by mouth daily.    . fluorouracil (EFUDEX) 5 % cream Apply 1 application topically daily.    . SYMBICORT 160-4.5 MCG/ACT inhaler INHALE 2 PUFFS BY MOUTH TWICE DAILY 10.2 g 11  . hydrochlorothiazide (MICROZIDE) 12.5 MG capsule Take 1 capsule (12.5 mg total) by mouth daily. 90 capsule 0  . predniSONE (DELTASONE) 20 MG tablet Take 1 tablet (20 mg total) by mouth 2 (two) times daily with a meal. 10 tablet 0   No facility-administered medications prior to visit.     ROS Review of Systems  Constitutional: Positive for fatigue. Negative for fever, chills and diaphoresis.  HENT: Negative for congestion and sore throat.   Respiratory: Positive for cough, chest tightness and shortness of breath. Negative for wheezing.   Cardiovascular: Negative for chest pain, palpitations and leg swelling.  Gastrointestinal: Negative for nausea, vomiting and diarrhea.  Skin: Negative for rash.  Neurological: Negative for dizziness and headaches.    Objective:  BP 140/82 mmHg  Pulse 95  Temp(Src) 98.2 F (36.8 C) (Oral)  Resp 18  Ht 5\' 7"  (1.702 m)  Wt 150 lb (68.04 kg)  BMI 23.49 kg/m2  SpO2 93%  Physical Exam  Constitutional: She is oriented to person, place, and time. She appears well-developed and well-nourished. No distress.  HENT:  Head: Normocephalic and atraumatic.  Right Ear: External ear normal.  Left Ear: External ear normal.  Mouth/Throat: Oropharynx is clear and moist. No oropharyngeal exudate.  Cardiovascular: Normal rate, regular rhythm and normal heart sounds.  Exam reveals no gallop and no friction rub.   No murmur heard. Pulmonary/Chest: Effort normal. No respiratory distress. She has wheezes. She has no rales. She exhibits no tenderness.  Able to complete full sentences  Neurological: She is alert and oriented to person, place, and time. No cranial nerve deficit. She exhibits normal muscle tone. Coordination normal.  Skin: Skin is warm and dry. No rash noted. She is not diaphoretic.  Psychiatric: She has a normal mood and affect. Her behavior is normal. Judgment and  thought content normal.   Assessment & Plan:   Diagnoses and all orders for this visit:  COPD exacerbation ([place])  Other orders -     doxycycline (VIBRA-TABS) 100 MG tablet; Take 1 tablet (100 mg total) by mouth 2 (two) times daily. -     predniSONE (DELTASONE) 20 MG tablet; Take 1 tablet (20 mg total) by mouth 2 (two) times daily with a meal.  I am having Ms.  Crable start on doxycycline. I am also having her maintain her fluorouracil, SYMBICORT, albuterol, diltiazem, and predniSONE.  Meds ordered this encounter  Medications  . doxycycline (VIBRA-TABS) 100 MG tablet    Sig: Take 1 tablet (100 mg total) by mouth 2 (two) times daily.    Dispense:  14 tablet    Refill:  0    Order Specific Question:  Supervising Provider    Answer:  Deborra Medina L [[id number]]  . predniSONE (DELTASONE) 20 MG tablet    Sig: Take 1 tablet (20 mg total) by mouth 2 (two) times daily with a meal.    Dispense:  10 tablet    Refill:  0    Order Specific Question:  Supervising Provider    Answer:  Crecencio Mc [[id number]]     Follow-up: Return if symptoms worsen or fail to improve.

## 2015-08-25 ENCOUNTER — Other Ambulatory Visit: Payer: Self-pay | Admitting: Internal Medicine

## 2015-08-26 NOTE — Assessment & Plan Note (Signed)
Frequent exacerbations.  Pt was discussing switching pulmonologists because her husband sees the same doctor and she felt this was not good. I advised her this was fine, but if she wants to switch to discuss with them.  Doxycyline and prednisone taper sent to pharmacy for help over the weekend FU prn worsening/failure to improve.

## 2015-08-29 ENCOUNTER — Telehealth: Payer: Self-pay | Admitting: Family

## 2015-08-29 NOTE — Telephone Encounter (Signed)
Pt was instructed to stop abx and prednisone and start 25 mg qhs. Pt stated understanding.

## 2015-08-29 NOTE — Telephone Encounter (Signed)
Forwarding

## 2015-08-29 NOTE — Telephone Encounter (Signed)
Yes, she can take Benadryl- find out what she means by face swelling-

## 2015-08-29 NOTE — Telephone Encounter (Signed)
Patient seen Diane Patterson and was given prednisone and doxycycline (VIBRA-TABS) 100 MG tablet [id number]  She states her face is swollen and she asked the pharmacist and he said it shouldn't be the medication. They told her to call us and see if its ok she take benadryl Please try to give her a call this afternoon

## 2015-09-07 ENCOUNTER — Other Ambulatory Visit: Payer: Self-pay | Admitting: Internal Medicine

## 2015-09-08 NOTE — Telephone Encounter (Signed)
im not showing that we currently prescribe this for---are you ok with refilling?

## 2015-09-09 ENCOUNTER — Other Ambulatory Visit: Payer: Self-pay | Admitting: Internal Medicine

## 2015-09-09 NOTE — Telephone Encounter (Signed)
Pt left msg on triage requesting status on refill.../lmb 

## 2015-09-15 ENCOUNTER — Ambulatory Visit (INDEPENDENT_AMBULATORY_CARE_PROVIDER_SITE_OTHER): Payer: Medicare Other | Admitting: Cardiology

## 2015-09-15 ENCOUNTER — Encounter: Payer: Self-pay | Admitting: Cardiology

## 2015-09-15 VITALS — BP 128/66 | HR 74 | Ht 67.0 in | Wt 148.0 lb

## 2015-09-15 DIAGNOSIS — I10 Essential (primary) hypertension: Secondary | ICD-10-CM | POA: Diagnosis not present

## 2015-09-15 DIAGNOSIS — R079 Chest pain, unspecified: Secondary | ICD-10-CM

## 2015-09-15 DIAGNOSIS — J438 Other emphysema: Secondary | ICD-10-CM

## 2015-09-15 DIAGNOSIS — R55 Syncope and collapse: Secondary | ICD-10-CM

## 2015-09-15 DIAGNOSIS — I444 Left anterior fascicular block: Secondary | ICD-10-CM

## 2015-09-15 NOTE — Patient Instructions (Signed)
Medication Instructions:  The current medical regimen is effective;  continue present plan and medications.  Follow-Up: Follow up as needed with Dr Skains.  If you need a refill on your cardiac medications before your next appointment, please call your pharmacy.  Thank you for choosing [facility]!!     

## 2015-09-15 NOTE — Progress Notes (Signed)
Cardiology Office Note    Date:  09/15/2015   ID:  Desha, Bolding 09-12-1945, MRN [id number]  PCP:  Mauricio Po, FNP  Cardiologist:   Candee Furbish, MD     History of Present Illness:  Diane Patterson is a 70 y.o. female with COPD, asthma, hypertension here for cardiac evaluation.  Over the Christmas holidays, she had a horrible upper respiratory infection, was seen in the emergency department. Was hospitalized. Prior to this hospitalization, while sitting on the couch, her vision turned black. That night she had very sharp left-sided chest discomfort that was severe. She laid in bed very still for about 3 minutes and then it finally subsided. Worried her. Last week, while watching television, her vision once again turned black. She reports that she is not had any issues with low blood pressure in the past. They have been however trying to adjust her hypertension regimen. She bruises quite easily.  EKG from 06/17/15 shows sinus rhythm and left anterior fascicular block. Went to ER with SOB.   Thankfully, her nuclear stress test, event monitor reassuring. It is likely that her symptoms of shortness of breath or COPD related. No evidence of ischemia. When she coughs and brings up mucus, this does help sometimes with her shortness of breath. She did ask me why there is no cure for COPD at this point. We discussed.    Past Medical History  Diagnosis Date  . Tobacco abuse   . Venous insufficiency   . Chronic airflow obstruction (H[facility])   . Hypertension   . Asthma     Past Surgical History  Procedure Laterality Date  . Vesicovaginal fistula closure w/ tah    . Neck surgery    . Cataract    . No colonoscopy      "afraid to "; [facility] reviewed    Outpatient Prescriptions Prior to Visit  Medication Sig Dispense Refill  . albuterol (PROVENTIL HFA;VENTOLIN HFA) 108 (90 Base) MCG/ACT inhaler Inhale 2 puffs into the lungs every 4 (four) hours as needed for wheezing or  shortness of breath. 1 Inhaler 3  . diltiazem (CARDIZEM SR) 90 MG 12 hr capsule TAKE 1 CAPSULE BY MOUTH DAILY 90 capsule 3  . fluorouracil (EFUDEX) 5 % cream Apply 1 application topically daily.    . hydrochlorothiazide (MICROZIDE) 12.5 MG capsule TAKE 1 CAPSULE EVERY DAY 90 capsule 1  . SYMBICORT 160-4.5 MCG/ACT inhaler INHALE 2 PUFFS BY MOUTH TWICE DAILY 10.2 g 11  . diltiazem (CARDIZEM SR) 90 MG 12 hr capsule TAKE 1 CAPSULE BY MOUTH DAILY 90 capsule 1  . doxycycline (VIBRA-TABS) 100 MG tablet Take 1 tablet (100 mg total) by mouth 2 (two) times daily. 14 tablet 0  . predniSONE (DELTASONE) 20 MG tablet Take 1 tablet (20 mg total) by mouth 2 (two) times daily with a meal. 10 tablet 0   No facility-administered medications prior to visit.     Allergies:   Review of patient's allergies indicates no known allergies.   Social History   Social History  . Marital Status: Married    Spouse Name: N/A  . Number of Children: 1  . Years of Education: N/A   Social History Main Topics  . Smoking status: Former Smoker -- 0.75 packs/day for 15 years    Types: Cigarettes    Quit date: 03/20/2014  . Smokeless tobacco: Former Systems developer    Quit date: 03/11/2014  . Alcohol Use: No  . Drug Use: No  . Sexual  Activity: Not Asked   Other Topics Concern  . None   Social History Narrative     Family History:  The patient's family history includes Asthma in her father; Diabetes in her mother; Emphysema in her father; Heart disease in her father.   ROS:   Please see the history of present illness.    ROS shortness of breath, excessive fatigue, easy bruising All other systems reviewed and are negative.   PHYSICAL EXAM:   VS:  BP 128/66 mmHg  Pulse 74  Ht 5\' 7"  (1.702 m)  Wt 148 lb (67.132 kg)  BMI 23.17 kg/m2   GEN: Well nourished, well developed, in no acute distress HEENT: normal Neck: no JVD, carotid bruits, or masses Cardiac: RRR; no murmurs, rubs, or gallops,no edema  Respiratory:  Poor  air movement. No wheezes. GI: soft, nontender, nondistended, + BS MS: no deformity or atrophy Skin: warm and dry, no rash Neuro:  Alert and Oriented x 3, Strength and sensation are intact Psych: euthymic mood, full affect  Wt Readings from Last 3 Encounters:  09/15/15 148 lb (67.132 kg)  08/22/15 150 lb (68.04 kg)  08/12/15 150 lb (68.04 kg)      Studies/Labs Reviewed:   EKG:  EKG is not ordered today.    Recent Labs: No results found for requested labs within last 365 days.   Lipid Panel    Component Value Date/Time   CHOL 203* 02/10/2010 1017   TRIG 138.0 02/10/2010 1017   HDL 55.10 02/10/2010 1017   CHOLHDL 4 02/10/2010 1017   VLDL 27.6 02/10/2010 1017   LDLDIRECT 136.0 02/10/2010 1017    Additional studies/ records that were reviewed today include:  Prior office notes reviewed, lab work reviewed Chest x-ray shows hyperinflation consistent with COPD  NUC stress: 08/12/15: 1. This study was count-poor at rest and with Lexiscan stress. There is a fixed, small, mild apical perfusion defect with normal wall motion that is likely artifact. No definite ischemia or infarction.  2. Normal EF and wall motion.  3. Low risk study.   Reassuring  Event monitor 08/12/15: No adverse rhythms.   ASSESSMENT:    No diagnosis found.   PLAN:  In order of problems listed above:  1. Atypical chest pain-thankfully, nuclear stress test was low risk with no ischemia. Reassurance. Her brother had a heart attack during a treadmill stress test so she was anxious about this. With sharp pain lasting 3 minutes across her left chest wall, this is likely musculoskeletal in etiology. 2. Near syncope-vision blackout. Could be from hypotension transiently. No other constellation of vagal-like symptoms. Event monitor was reassuring with no adverse arrhythmias over the two-week time. She wore it. She had to stop wearing it because of flulike symptoms. 3. COPD-poor air movement noted on exam.  Inhalers. Dr. Elsworth Soho with pulmonary monitoring. 4. Essential hypertension-currently borderline elevated. 5. 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    Medication Adjustments/Labs and Tests Ordered: Current medicines are reviewed at length with the patient today.  Concerns regarding medicines are outlined above.  Medication changes, Labs and Tests ordered today are listed in the Patient Instructions below. Patient Instructions  Medication Instructions:  The current medical regimen is effective;  continue present plan and medications.  Follow-Up: Follow up as needed with Dr Marlou Porch.  If you need a refill on your cardiac medications before your next appointment, please call your pharmacy.  Thank you for choosing [facility]!!  Bobby Rumpf, MD  09/15/2015 10:56 AM    [facility] [place], [place], [place]  [address] Phone: [phone]; Fax: [phone]

## 2015-09-30 ENCOUNTER — Telehealth: Payer: Self-pay | Admitting: Family

## 2015-09-30 ENCOUNTER — Encounter: Payer: Self-pay | Admitting: Family

## 2015-09-30 ENCOUNTER — Other Ambulatory Visit (INDEPENDENT_AMBULATORY_CARE_PROVIDER_SITE_OTHER): Payer: Medicare Other

## 2015-09-30 ENCOUNTER — Ambulatory Visit (INDEPENDENT_AMBULATORY_CARE_PROVIDER_SITE_OTHER): Payer: Medicare Other | Admitting: Family

## 2015-09-30 VITALS — BP 130/80 | HR 73 | Temp 97.5°F | Ht 67.0 in | Wt 142.0 lb

## 2015-09-30 DIAGNOSIS — E876 Hypokalemia: Secondary | ICD-10-CM

## 2015-09-30 DIAGNOSIS — K5900 Constipation, unspecified: Secondary | ICD-10-CM

## 2015-09-30 DIAGNOSIS — I10 Essential (primary) hypertension: Secondary | ICD-10-CM

## 2015-09-30 LAB — COMPREHENSIVE METABOLIC PANEL
ALK PHOS: 79 U/L (ref 39–117)
ALT: 31 U/L (ref 0–35)
AST: 25 U/L (ref 0–37)
Albumin: 4 g/dL (ref 3.5–5.2)
BILIRUBIN TOTAL: 0.5 mg/dL (ref 0.2–1.2)
BUN: 13 mg/dL (ref 6–23)
CALCIUM: 9.6 mg/dL (ref 8.4–10.5)
CO2: 34 mEq/L — ABNORMAL HIGH (ref 19–32)
Chloride: 98 mEq/L (ref 96–112)
Creatinine, Ser: 0.86 mg/dL (ref 0.40–1.20)
GFR: 69.23 mL/min (ref >60.00)
Glucose, Bld: 123 mg/dL — ABNORMAL HIGH (ref 70–99)
POTASSIUM: 3 meq/L — AB (ref 3.5–5.1)
Sodium: 139 mEq/L (ref 135–145)
TOTAL PROTEIN: 6.9 g/dL (ref 6.0–8.3)

## 2015-09-30 LAB — CBC WITH DIFFERENTIAL/PLATELET
BASOS ABS: 0 10*3/uL (ref 0.0–0.1)
Basophils Relative: 0.4 % (ref 0.0–3.0)
EOS PCT: 9.2 % — AB (ref 0.0–5.0)
Eosinophils Absolute: 1.3 10*3/uL — ABNORMAL HIGH (ref 0.0–0.7)
HEMATOCRIT: 37.1 % (ref 36.0–46.0)
HEMOGLOBIN: 12.4 g/dL (ref 12.0–15.0)
LYMPHS PCT: 18 % (ref 12.0–46.0)
Lymphs Abs: 2.5 10*3/uL (ref 0.7–4.0)
MCHC: 33.4 g/dL (ref 30.0–36.0)
MCV: 90.4 fl (ref 78.0–100.0)
MONOS PCT: 7 % (ref 3.0–12.0)
Monocytes Absolute: 1 10*3/uL (ref 0.1–1.0)
Neutro Abs: 9 10*3/uL — ABNORMAL HIGH (ref 1.4–7.7)
Neutrophils Relative %: 65.4 % (ref 43.0–77.0)
Platelets: 278 10*3/uL (ref 150.0–400.0)
RBC: 4.1 Mil/uL (ref 3.87–5.11)
RDW: 16 % — ABNORMAL HIGH (ref 11.5–15.5)
WBC: 13.8 10*3/uL — ABNORMAL HIGH (ref 4.0–10.5)

## 2015-09-30 MED ORDER — POTASSIUM CHLORIDE CRYS ER 20 MEQ PO TBCR: 20.0000 meq | EXTENDED_RELEASE_TABLET | Freq: Every day | ORAL | Status: DC

## 2015-09-30 MED ORDER — HYDROCHLOROTHIAZIDE 12.5 MG PO CAPS: 12.5000 mg | ORAL_CAPSULE | Freq: Every day | ORAL | Status: DC

## 2015-09-30 NOTE — Progress Notes (Signed)
Subjective:    Patient ID: Diane Patterson, female    DOB: 1946/05/20, 70 y.o.   MRN: [id number]   Diane Patterson is a 70 y.o. female who presents today for an acute visit.    HPI Comments: Last BM yesterday, notes hard pieces of stool. Strained to have bowel movement. Notes 'thin brown string' in stool.  Of note, patient has declined colonoscopy screening.   Abdominal Pain This is a new problem. The current episode started 1 to 4 weeks ago. The problem occurs intermittently. The problem has been waxing and waning. The pain is located in the generalized abdominal region. The pain is at a severity of 1/10. The pain is mild. The quality of the pain is aching. Associated symptoms include belching, constipation, flatus and nausea. Pertinent negatives include no anorexia, arthralgias, fever, headaches, melena, myalgias or vomiting. Nothing aggravates the pain. The pain is relieved by being still. She has tried acetaminophen (gas x) for the symptoms. The treatment provided moderate relief. Her past medical history is significant for abdominal surgery. There is no history of gallstones or pancreatitis. hysterectomy   Past Medical History  Diagnosis Date  . Tobacco abuse   . Venous insufficiency   . Chronic airflow obstruction (HCC)   . Hypertension   . Asthma    Review of patient's allergies indicates no known allergies. Current Outpatient Prescriptions on File Prior to Visit  Medication Sig Dispense Refill  . albuterol (PROVENTIL HFA;VENTOLIN HFA) 108 (90 Base) MCG/ACT inhaler Inhale 2 puffs into the lungs every 4 (four) hours as needed for wheezing or shortness of breath. 1 Inhaler 3  . diltiazem (CARDIZEM SR) 90 MG 12 hr capsule TAKE 1 CAPSULE BY MOUTH DAILY 90 capsule 3  . fluorouracil (EFUDEX) 5 % cream Apply 1 application topically daily.    . hydrochlorothiazide (MICROZIDE) 12.5 MG capsule TAKE 1 CAPSULE EVERY DAY 90 capsule 1  . SYMBICORT 160-4.5 MCG/ACT inhaler INHALE 2  PUFFS BY MOUTH TWICE DAILY 10.2 g 11   No current facility-administered medications on file prior to visit.    Social History  Substance Use Topics  . Smoking status: Former Smoker -- 0.75 packs/day for 15 years    Types: Cigarettes    Quit date: 03/20/2014  . Smokeless tobacco: Former Systems developer    Quit date: 03/11/2014  . Alcohol Use: No    Review of Systems  Constitutional: Negative for fever, chills and unexpected weight change.  HENT: Negative for congestion, ear pain, sinus pressure and sore throat.   Eyes: Negative for visual disturbance.  Respiratory: Negative for cough, shortness of breath and wheezing.   Cardiovascular: Negative for chest pain, palpitations and leg swelling.  Gastrointestinal: Positive for nausea, abdominal pain, constipation and flatus. Negative for vomiting, melena and anorexia.  Musculoskeletal: Negative for myalgias and arthralgias.  Skin: Negative for rash.  Neurological: Negative for headaches.  Hematological: Negative for adenopathy.      Objective:    BP 130/80 mmHg  Pulse 73  Temp(Src) 97.5 F (36.4 C) (Oral)  Ht 5\' 7"  (1.702 m)  Wt 142 lb (64.411 kg)  BMI 22.24 kg/m2  SpO2 96%   Physical Exam  Constitutional: She appears well-developed and well-nourished.  Eyes: Conjunctivae are normal.  Cardiovascular: Normal rate, regular rhythm, normal heart sounds and normal pulses.   Pulmonary/Chest: Effort normal and breath sounds normal. She has no wheezes. She has no rhonchi. She has no rales.  Abdominal: Soft. Normal appearance and bowel sounds are normal. She exhibits  no distension, no ascites and no mass. There is tenderness. There is no rigidity, no rebound, no guarding, no CVA tenderness and negative Murphy's sign.  Mild, general abdominal tenderness with palpation.  Neurological: She is alert.  Skin: Skin is warm and dry.  Psychiatric: She has a normal mood and affect. Her speech is normal and behavior is normal. Thought content normal.    Vitals reviewed.      Assessment & Plan:   1. Constipation, unspecified constipation type Of note, patient has never had a colonoscopy. Discussed with patient my concern of her recent change in bowel habits and the importance of a consult with GI for colonoscopy. I have placed that referral.  - Ambulatory referral to Gastroenterology - Comprehensive metabolic panel; Future- Potassium is 3.0 and likely related to her hydrochlorothiazide. Advised patient to ensure adequate hydration and to include potassium rich foods such as bananas. - CBC with Differential/Platelet; Future- WBC and eosinophils, and neutrophil count are elevated. I will have patient come back and recheck these results as they do not correspond with clinical symptoms of constipation.    I am having Ms. Nazario maintain her fluorouracil, SYMBICORT, albuterol, hydrochlorothiazide, and diltiazem.   No orders of the defined types were placed in this encounter.     Start medications as prescribed and explained to patient on After Visit Summary ( AVS). Risks, benefits, and alternatives of the medications and treatment plan prescribed today were discussed, and patient expressed understanding.   Education regarding symptom management and diagnosis given to patient.   Follow-up:Plan follow-up as discussed or as needed if any worsening symptoms or change in condition. No Follow-up on file.   Continue to follow with Mauricio Po, FNP for routine health maintenance.   Diane Patterson and I agreed with plan.   Diane Paris, FNP

## 2015-09-30 NOTE — Telephone Encounter (Signed)
Patient informed of results and provider recommendation.

## 2015-09-30 NOTE — Progress Notes (Signed)
Pre visit review using our clinic review tool, if applicable. No additional management support is needed unless otherwise documented below in the visit note. 

## 2015-09-30 NOTE — Telephone Encounter (Signed)
See note that was routed to [place].

## 2015-09-30 NOTE — Patient Instructions (Addendum)
Referral to GI for colonoscopy/consult regarding change in bowel habits.   Constipation plan  1) Take Miralax once to twice per day until you have a bowel movement. You will end up titrating the use of Miralax. For example, you  may find that using the medication every other day or three times a week is a good bowel regimen for you. Or perhaps, twice weekly.   2)  If you do not get results with Miralax alone, you may then add Bisacodyl suppository daily to regimen until you get desired bowel results.  3) Take Colace ( stool softener) twice daily every day.   It is MOST important to drink LOTS of water and follow a HIGH fiber diet to keep foods moving through the gut. You may add Metamucil to a beverage that you drink.  Information on prevention of constipation as well as acute treatment for constipation as included below.  If there is no improvement in your symptoms, or if there is any worsening of symptoms, or if you have any additional concerns, please return to this clinic for re-evaluation; or, if we are closed, consider going to the Emergency Room for evaluation.    Constipation Prevention What is Constipation? Constipation is hard, dry bowel movements or the inability to have a bowel movement.  You can also feel like you need to have a bowel movement but not be able to.  It can also be painful when you strain to have a bowel movement.  Taking narcotic pain medicine after surgery can make you constipated, even if you have never had a problem with constipation. What Do I Need To Do? The best thing to do for constipation is to keep it from happening.  This can be done by: Adding laxatives to your daily routine, when taking prescription pain medicines after surgery. Add 17 gm Miralax daily or 100 mg Colace once or twice daily. (Miralax is mixed in water. Colace is a pill). They soften your bowel movements to make them easier to pass and hurt less. Drink plenty of water to help flush your  bowels.  (Eight, 8 ounce glasses daily) Eat foods high in fiber such as whole grains, vegetables, cereals, fruits, and prune juice (5-7 servings a day or 25 grams).  If you do not know how much fiber a food has in it you can look on the label under dietary fiber.  If you have trouble getting enough fiber in your diet you may want to consider a fiber supplement such as Metamucil or Citrucel.  Also, be aware that eating fiber without drinking enough water can make constipation worse. If you do become constipated some medications that may help are: Bisacodyl (Dulcolax) is available in tablet form or a suppository. Glycerin suppositories are also a good choice if you need a fast acting medication. Everybody is different and may have different results.  Talk to your pharmacist or health care provider about your specific problems. They can help you choose the best product for you.  Why Is It Important for Me To Do This? Being constipated is not something you have to live with.  There are many things you can do to help.   Feeling bad can interfere with your recovery after surgery.  If constipation goes on for too long it can become a very serious medical problem. You may need to visit your doctor or go to the hospital.  That is why it is very important to drink lots of water, eat enough fiber,  and keep it from happening.  Ask Questions We want to answer all of your questions and concerns.  Thats why we encourage you to use a program called Ask Me 3, created by the [employer].  By using Ask Me 3 you are encouraged to ask 3 simple (yet, potentially life saving questions) whenever you are talking with your physician, nurse or pharmacist: What is my main problem? What do I need to do? Why is it important for me to do this? By understanding the answer to these three questions and any other questions you may have, you have the knowledge necessary to manage your health. Please  feel very comfortable asking any questions. Healthcare is complicated, so if you hear an answer you do not understand, please ask your health care team to explain again.   Sources: [employer] Med[employer] Plus 04-02-10 N

## 2015-10-01 ENCOUNTER — Encounter: Payer: Self-pay | Admitting: Gastroenterology

## 2015-10-27 ENCOUNTER — Ambulatory Visit: Payer: Medicare Other | Admitting: Family

## 2015-10-29 ENCOUNTER — Telehealth: Payer: Self-pay | Admitting: Family

## 2015-10-29 NOTE — Telephone Encounter (Signed)
Patient no showed 5/15 for follow up.  Please advise.

## 2015-10-29 NOTE — Telephone Encounter (Signed)
Ok to reschedule if she calls back.  

## 2015-10-29 NOTE — Telephone Encounter (Signed)
noted 

## 2015-11-28 ENCOUNTER — Ambulatory Visit (INDEPENDENT_AMBULATORY_CARE_PROVIDER_SITE_OTHER): Payer: Medicare Other | Admitting: Gastroenterology

## 2015-11-28 ENCOUNTER — Encounter: Payer: Self-pay | Admitting: Gastroenterology

## 2015-11-28 VITALS — BP 128/72 | HR 70 | Ht 67.5 in | Wt 145.0 lb

## 2015-11-28 DIAGNOSIS — K5900 Constipation, unspecified: Secondary | ICD-10-CM | POA: Diagnosis not present

## 2015-11-28 DIAGNOSIS — J449 Chronic obstructive pulmonary disease, unspecified: Secondary | ICD-10-CM

## 2015-11-28 MED ORDER — NA SULFATE-K SULFATE-MG SULF 17.5-3.13-1.6 GM/177ML PO SOLN: 1.0000 | Freq: Once | ORAL | Status: AC

## 2015-11-28 NOTE — Patient Instructions (Addendum)
Purchase a bottle of magnesium citrate over the counter at any pharmacy and drink it over the course of 1 hour.  It will make you move your bowels. ________________________________________________________   Then do the following for chronic constipation:  Constipation:  Increase your intake of water, fruits/vegetables and your activity level.  Ducosate stool softener, 100 mg twice daily. If not improved in 1 week after starting that, please add:  Miralax powder  1 capful in a glass of water or juice once daily.   You have been scheduled for a colonoscopy. Please follow written instructions given to you at your visit today.  Please pick up your prep supplies at the pharmacy within the next 1-3 days. If you use inhalers (even only as needed), please bring them with you on the day of your procedure. Your physician has requested that you go to www.startemmi.com and enter the access code given to you at your visit today. This web site gives a general overview about your procedure. However, you should still follow specific instructions given to you by our office regarding your preparation for the procedure.  If you are age 71 or older, your body mass index should be between 23-30. Your Body mass index is 22.36 kg/(m^2). If this is out of the aforementioned range listed, please consider follow up with your Primary Care Provider.  If you are age 45 or younger, your body mass index should be between 19-25. Your Body mass index is 22.36 kg/(m^2). If this is out of the aformentioned range listed, please consider follow up with your Primary Care Provider.   Thank you for choosing [facility]  Dr Wilfrid Lund III

## 2015-11-28 NOTE — Progress Notes (Signed)
[facility] Consult Note:  History: Diane Patterson 11/28/2015  Referring physician: Mauricio Po, [place]  Reason for consult/chief complaint: Constipation   Subjective HPI:  Diane Patterson was referred for recent constipation. It sounds like she has tendencies to constipation for the last couple of years, but it became acutely worse 2 or 3 weeks ago for unclear reasons. She has only had some small pellet-like stools in the last few days, denies rectal bleeding or weight loss. She tends toward abdominal bloating and has had some right flank pain since her recent fall. She has a limited historian, but says her bowels are regular she just remembers to eat her fruit.   ROS:  Review of Systems  Constitutional: Negative for appetite change and unexpected weight change.  HENT: Negative for mouth sores and voice change.   Eyes: Negative for pain and redness.  Respiratory: Positive for shortness of breath. Negative for cough.   Cardiovascular: Negative for chest pain and palpitations.  Genitourinary: Negative for dysuria and hematuria.  Musculoskeletal: Positive for back pain. Negative for myalgias and arthralgias.  Skin: Negative for pallor and rash.  Neurological: Negative for weakness and headaches.  Hematological: Negative for adenopathy.     Past Medical History: Past Medical History  Diagnosis Date  . Tobacco abuse   . Venous insufficiency   . Chronic airflow obstruction (HCC)   . Hypertension   . Asthma    She has never had a screening colonoscopy  Past Surgical History: Past Surgical History  Procedure Laterality Date  . Vesicovaginal fistula closure w/ tah    . Neck surgery    . Cataract    . No colonoscopy      "afraid to "; [facility] reviewed     Family History: Family History  Problem Relation Age of Onset  . Emphysema Father   . Asthma Father   . Heart disease Father   . Diabetes Mother     Social History: Social History   Social History  .  Marital Status: Married    Spouse Name: N/A  . Number of Children: 1  . Years of Education: N/A   Social History Main Topics  . Smoking status: Former Smoker -- 0.75 packs/day for 15 years    Types: Cigarettes    Quit date: 03/20/2014  . Smokeless tobacco: Former Systems developer    Quit date: 03/11/2014  . Alcohol Use: No  . Drug Use: No  . Sexual Activity: Not Asked   Other Topics Concern  . None   Social History Narrative    Allergies: No Known Allergies  Outpatient Meds: Current Outpatient Prescriptions  Medication Sig Dispense Refill  . albuterol (PROVENTIL HFA;VENTOLIN HFA) 108 (90 Base) MCG/ACT inhaler Inhale 2 puffs into the lungs every 4 (four) hours as needed for wheezing or shortness of breath. 1 Inhaler 3  . diltiazem (CARDIZEM SR) 90 MG 12 hr capsule TAKE 1 CAPSULE BY MOUTH DAILY 90 capsule 3  . fluorouracil (EFUDEX) 5 % cream Apply 1 application topically daily.    . hydrochlorothiazide (MICROZIDE) 12.5 MG capsule Take 1 capsule (12.5 mg total) by mouth daily. 90 capsule 1  . potassium chloride SA (K-DUR,KLOR-CON) 20 MEQ tablet Take 1 tablet (20 mEq total) by mouth daily. 30 tablet 1  . SYMBICORT 160-4.5 MCG/ACT inhaler INHALE 2 PUFFS BY MOUTH TWICE DAILY 10.2 g 11  . Na Sulfate-K Sulfate-Mg Sulf 17.5-3.13-1.6 GM/180ML SOLN Take 1 kit by mouth once. 354 mL 0   No current facility-administered medications for this  visit.      ___________________________________________________________________ Objective  Exam:  BP 128/72 mmHg  Pulse 70  Ht 5' 7.5" (1.715 m)  Wt 145 lb (65.772 kg)  BMI 22.36 kg/m2   General: this is a(n) Well-appearing elderly woman   Eyes: sclera anicteric, no redness  ENT: oral mucosa moist without lesions, no cervical or supraclavicular lymphadenopathy, good dentition  CV: RRR without murmur, S1/S2, no JVD, no peripheral edema  Resp: clear to auscultation bilaterally, normal RR and effort noted  GI: soft, mild RLQ tenderness, with  active bowel sounds. No guarding or palpable organomegaly noted.  Skin; warm and dry, no rash or jaundice noted  Neuro: awake, alert and oriented x 3. Normal gross motor function and fluent speech Tenderness over right lumbar paraspinous muscles  CBC    Component Value Date/Time   WBC 13.8* 09/30/2015 1032   RBC 4.10 09/30/2015 1032   HGB 12.4 09/30/2015 1032   HCT 37.1 09/30/2015 1032   PLT 278.0 09/30/2015 1032   MCV 90.4 09/30/2015 1032   MCH 29.8 03/03/2014 1232   MCHC 33.4 09/30/2015 1032   RDW 16.0* 09/30/2015 1032   LYMPHSABS 2.5 09/30/2015 1032   MONOABS 1.0 09/30/2015 1032   EOSABS 1.3* 09/30/2015 1032   BASOSABS 0.0 09/30/2015 1032      Assessment: Encounter Diagnoses  Name Primary?  . Constipation, unspecified constipation type Yes  . Chronic obstructive pulmonary disease, unspecified COPD type ([place])     It is not clear where her constipation recently worsened. I gave her written instructions for a bowel purge followed by a chronic bowel regimen. We will schedule colonoscopy to rule out anything obstructive.  I've also asked her to contact primary care regarding this lower back pain after recent fall to see if any imaging is necessary.  Thank you for the courtesy of this consult.  Please call me with any questions or concerns.  Nelida Meuse III  CC: Mauricio Po, FNP

## 2015-12-01 ENCOUNTER — Ambulatory Visit (INDEPENDENT_AMBULATORY_CARE_PROVIDER_SITE_OTHER): Payer: Medicare Other | Admitting: Family

## 2015-12-01 ENCOUNTER — Encounter: Payer: Self-pay | Admitting: Family

## 2015-12-01 VITALS — BP 144/86 | HR 77 | Temp 97.4°F | Resp 16 | Ht 67.0 in | Wt 145.0 lb

## 2015-12-01 DIAGNOSIS — T148 Other injury of unspecified body region: Secondary | ICD-10-CM

## 2015-12-01 DIAGNOSIS — T148XXA Other injury of unspecified body region, initial encounter: Secondary | ICD-10-CM | POA: Insufficient documentation

## 2015-12-01 NOTE — Progress Notes (Signed)
Subjective:    Patient ID: Diane Patterson, female    DOB: 03-20-1946, 70 y.o.   MRN: [id number]  Chief Complaint  Patient presents with  . Hip Pain    fell a month ago has had pain in her right hip since she fell    HPI:  Diane Patterson is a 70 y.o. female who  has a past medical history of Tobacco abuse; Venous insufficiency; Chronic airflow obstruction ([place]); Hypertension; and Asthma. and presents today for an acute office visit.  This is a new problem. Associated symptom of pain located in her right hip/side has been going on for about 1 month following a fall down the front steps when she collided with a dog landing on the sidewalk on her right side. Denies any sounds/sensations heard or felt. Pain is described as achy that is constant which is aggravated by taking a deep breath. Modifying factors include Tylenol which helps to lessen the pain. She remains functional and able to complete her activities of daily living but has the continued discomfort.   No Known Allergies   Current Outpatient Prescriptions on File Prior to Visit  Medication Sig Dispense Refill  . albuterol (PROVENTIL HFA;VENTOLIN HFA) 108 (90 Base) MCG/ACT inhaler Inhale 2 puffs into the lungs every 4 (four) hours as needed for wheezing or shortness of breath. 1 Inhaler 3  . diltiazem (CARDIZEM SR) 90 MG 12 hr capsule TAKE 1 CAPSULE BY MOUTH DAILY 90 capsule 3  . fluorouracil (EFUDEX) 5 % cream Apply 1 application topically daily.    . hydrochlorothiazide (MICROZIDE) 12.5 MG capsule Take 1 capsule (12.5 mg total) by mouth daily. 90 capsule 1  . Na Sulfate-K Sulfate-Mg Sulf 17.5-3.13-1.6 GM/180ML SOLN Take 1 kit by mouth once. 354 mL 0  . potassium chloride SA (K-DUR,KLOR-CON) 20 MEQ tablet Take 1 tablet (20 mEq total) by mouth daily. 30 tablet 1  . SYMBICORT 160-4.5 MCG/ACT inhaler INHALE 2 PUFFS BY MOUTH TWICE DAILY 10.2 g 11   No current facility-administered medications on file prior to visit.      Past Medical History  Diagnosis Date  . Tobacco abuse   . Venous insufficiency   . Chronic airflow obstruction (HCC)   . Hypertension   . Asthma      Review of Systems  Constitutional: Negative for fever and chills.  Musculoskeletal:       Positive for right side pain.  Neurological: Negative for weakness and numbness.      Objective:    BP 144/86 mmHg  Pulse 77  Temp(Src) 97.4 F (36.3 C) (Oral)  Resp 16  Ht _0  (1.702 m)  Wt 145 lb (65.772 kg)  BMI 22.71 kg/m2  SpO2 96% Nursing note and vital signs reviewed.  Physical Exam  Constitutional: She is oriented to person, place, and time. She appears well-developed and well-nourished. No distress.  Cardiovascular: Normal rate, regular rhythm, normal heart sounds and intact distal pulses.   Pulmonary/Chest: Effort normal and breath sounds normal.  Musculoskeletal:  Right hip/side - no obvious deformity, discoloration, or edema. Tenderness to palpation over posterior aspect of iliac crest and mildly tender anterior. Range of motion is within normal limits with no discomfort. Distal pulses and sensation are intact and appropriate. Strength is normal.  Neurological: She is alert and oriented to person, place, and time.  Skin: Skin is warm and dry.  Psychiatric: She has a normal mood and affect. Her behavior is normal. Judgment and thought content normal.  Assessment & Plan:   Problem List Items Addressed This Visit      Other   Contusion - Primary    Symptoms and exam consistent with contusion related to a mechanical fall. No significant indication for x-ray upon exam. Continue conservative treatment with ice/heat and home exercise therapy. Continue over-the-counter medications as needed for symptom relief and supportive care. Follow-up if symptoms worsen or do not improve.          I am having Ms. Browning maintain her fluorouracil, SYMBICORT, albuterol, diltiazem, hydrochlorothiazide, potassium chloride  SA, and Na Sulfate-K Sulfate-Mg Sulf.    Follow-up: Return if symptoms worsen or fail to improve.  Mauricio Po, FNP

## 2015-12-01 NOTE — Progress Notes (Signed)
Pre visit review using our clinic review tool, if applicable. No additional management support is needed unless otherwise documented below in the visit note. 

## 2015-12-01 NOTE — Patient Instructions (Signed)
Thank you for choosing [facility].  Summary/Instructions:  Ice/heat 2-3 times per day and as needed after activity.   Continue stretching and exercise.  Ibuprofen or Aleve as needed for discomfort.   If your symptoms worsen or fail to improve, please contact our office for further instruction, or in case of emergency go directly to the emergency room at the closest medical facility.   Contusion A contusion is a deep bruise. Contusions are the result of a blunt injury to tissues and muscle fibers under the skin. The injury causes bleeding under the skin. The skin overlying the contusion may turn blue, purple, or yellow. Minor injuries will give you a painless contusion, but more severe contusions may stay painful and swollen for a few weeks.  CAUSES  This condition is usually caused by a blow, trauma, or direct force to an area of the body. SYMPTOMS  Symptoms of this condition include:  Swelling of the injured area.  Pain and tenderness in the injured area.  Discoloration. The area may have redness and then turn blue, purple, or yellow. DIAGNOSIS  This condition is diagnosed based on a physical exam and medical history. An X-ray, CT scan, or MRI may be needed to determine if there are any associated injuries, such as broken bones (fractures). TREATMENT  Specific treatment for this condition depends on what area of the body was injured. In general, the best treatment for a contusion is resting, icing, applying pressure to (compression), and elevating the injured area. This is often called the RICE strategy. Over-the-counter anti-inflammatory medicines may also be recommended for pain control.  HOME CARE INSTRUCTIONS   Rest the injured area.  If directed, apply ice to the injured area:  Put ice in a plastic bag.  Place a towel between your skin and the bag.  Leave the ice on for 20 minutes, 2-3 times per day.  If directed, apply light compression to the injured area using  an elastic bandage. Make sure the bandage is not wrapped too tightly. Remove and reapply the bandage as directed by your health care provider.  If possible, raise (elevate) the injured area above the level of your heart while you are sitting or lying down.  Take over-the-counter and prescription medicines only as told by your health care provider. SEEK MEDICAL CARE IF:  Your symptoms do not improve after several days of treatment.  Your symptoms get worse.  You have difficulty moving the injured area. SEEK IMMEDIATE MEDICAL CARE IF:   You have severe pain.  You have numbness in a hand or foot.  Your hand or foot turns pale or cold.   This information is not intended to replace advice given to you by your health care provider. Make sure you discuss any questions you have with your health care provider.   Document Released: 03/10/2005 Document Revised: 02/19/2015 Document Reviewed: 10/16/2014 Elsevier Interactive Patient Education [employer].

## 2015-12-01 NOTE — Assessment & Plan Note (Signed)
Symptoms and exam consistent with contusion related to a mechanical fall. No significant indication for x-ray upon exam. Continue conservative treatment with ice/heat and home exercise therapy. Continue over-the-counter medications as needed for symptom relief and supportive care. Follow-up if symptoms worsen or do not improve.

## 2015-12-10 ENCOUNTER — Telehealth: Payer: Self-pay

## 2015-12-10 MED ORDER — AMOXICILLIN-POT CLAVULANATE 875-125 MG PO TABS: 1.0000 | ORAL_TABLET | Freq: Two times a day (BID) | ORAL | Status: DC

## 2015-12-10 NOTE — Telephone Encounter (Signed)
Augmentin sent to pharmacy.

## 2015-12-10 NOTE — Telephone Encounter (Signed)
Please advise 

## 2015-12-10 NOTE — Telephone Encounter (Signed)
Patient called and said because of her copd she needs some more abx. Can you please advise or follow up. Thank you

## 2015-12-10 NOTE — Telephone Encounter (Signed)
Pt aware of results 

## 2015-12-13 ENCOUNTER — Telehealth: Payer: Self-pay

## 2015-12-13 NOTE — Telephone Encounter (Signed)
Patient is on the list for [employer] 2017 and may be a good candidate for an AWV in 2017. Please let me know if/when appt is scheduled.   

## 2015-12-19 ENCOUNTER — Encounter: Payer: Self-pay | Admitting: Pulmonary Disease

## 2015-12-22 ENCOUNTER — Telehealth: Payer: Self-pay | Admitting: Pulmonary Disease

## 2015-12-22 MED ORDER — PREDNISONE 20 MG PO TABS: 20.0000 mg | ORAL_TABLET | Freq: Every day | ORAL | Status: DC

## 2015-12-22 NOTE — Telephone Encounter (Signed)
Called spoke with pt. Aware of recs below. RX sent in. Nothing further needed 

## 2015-12-22 NOTE — Telephone Encounter (Signed)
Called spoke with patient who c/o prod cough with yellow/cream colored mucus, wheezing, increased SOB, tightness in chest x2 weeks, worse over the weekend from using Clorox to clean her bathrooms.  Pt denies any hemoptysis, f/c/s, chest pain.  She is requesting prednisone.  Pt's last ov was 11.15.16 w/ TP, recommended to see RA in 3-4 months for COPD Pt scheduled appt with SG for 7.26.17.  Did advise pt that since she is overdue for appt and she is experiencing the above symptoms she needs to be seen sooner but pt declined stating that she will be going with her spouse for colonoscopy.  RA on vacation this week TP on vacation today  Will forward to DOD >> AD please advise, thank you.  No Known Allergies [facility] [address]

## 2015-12-22 NOTE — Telephone Encounter (Signed)
Can we call in prednisone 20 mg/day x 5 days?  If she is not better in 1-2 days, she needs CXR, BMP, cbc and needs to be seen soon. Thanks.  AD

## 2015-12-30 ENCOUNTER — Encounter: Payer: Self-pay | Admitting: Gastroenterology

## 2016-01-07 ENCOUNTER — Ambulatory Visit: Payer: Medicare Other | Admitting: Acute Care

## 2016-01-09 ENCOUNTER — Telehealth: Payer: Self-pay | Admitting: Family

## 2016-01-09 NOTE — Telephone Encounter (Signed)
Patient states she has a no show fee from 5/17.  Patient states she called the day before to cancel appt but was not cancelled.  Please advise.

## 2016-01-13 ENCOUNTER — Encounter: Payer: Medicare Other | Admitting: Gastroenterology

## 2016-01-13 NOTE — Telephone Encounter (Signed)
Emailed billing to void pt NS fee for DOS 10/27/15 as a one time patient courtesy, please inform patient.

## 2016-01-14 NOTE — Telephone Encounter (Signed)
Informed patient

## 2016-01-19 ENCOUNTER — Ambulatory Visit (INDEPENDENT_AMBULATORY_CARE_PROVIDER_SITE_OTHER): Payer: Medicare Other | Admitting: Adult Health

## 2016-01-19 ENCOUNTER — Encounter: Payer: Self-pay | Admitting: Adult Health

## 2016-01-19 DIAGNOSIS — J449 Chronic obstructive pulmonary disease, unspecified: Secondary | ICD-10-CM

## 2016-01-19 MED ORDER — BUDESONIDE-FORMOTEROL FUMARATE 160-4.5 MCG/ACT IN AERO: 2.0000 | INHALATION_SPRAY | Freq: Two times a day (BID) | RESPIRATORY_TRACT | 11 refills | Status: DC

## 2016-01-19 NOTE — Assessment & Plan Note (Signed)
Compensated on present regimen   Plan  Cont on regimen  follow up Dr. Elsworth Soho  In 1 year .

## 2016-01-19 NOTE — Patient Instructions (Addendum)
Continue on current regimen .  Follow up Dr. Elsworth Soho  In  1 year  And As needed

## 2016-01-19 NOTE — Progress Notes (Signed)
Subjective:    Patient ID: Diane Patterson, female    DOB: 1945/11/08, 70 y.o.   MRN: [id number]  HPI 70 yo female former smoker with GOLD IV COPD  FEV1 27% in 2009   01/19/2016  Follow up : COPD  Pt returns for yearly follow up. Pt states breathing is doing well and has no new complaints at this time.  Has good and bad days.  Has SOB with activity -seems at baseline.  Has been taking benadryl at night . That has really helped her post nasal drip.  She feels this has really helped. Has not had to take prednisone for last 6 months .  Denies any chest tightness/congestion, cough, fever, nausea or vomiting.  Remains on Symbicort . cxr 06/2015 copd changes .  PVX, Prevnar  tud.  We discussed low dose CT screening program, she declines at this time.     Past Medical History:  Diagnosis Date  . Asthma   . Chronic airflow obstruction (HCC)   . Hypertension   . Tobacco abuse   . Venous insufficiency     Current Outpatient Prescriptions on File Prior to Visit  Medication Sig Dispense Refill  . albuterol (PROVENTIL HFA;VENTOLIN HFA) 108 (90 Base) MCG/ACT inhaler Inhale 2 puffs into the lungs every 4 (four) hours as needed for wheezing or shortness of breath. 1 Inhaler 3  . diltiazem (CARDIZEM SR) 90 MG 12 hr capsule TAKE 1 CAPSULE BY MOUTH DAILY 90 capsule 3  . fluorouracil (EFUDEX) 5 % cream Apply 1 application topically daily.    . hydrochlorothiazide (MICROZIDE) 12.5 MG capsule Take 1 capsule (12.5 mg total) by mouth daily. 90 capsule 1  . potassium chloride SA (K-DUR,KLOR-CON) 20 MEQ tablet Take 1 tablet (20 mEq total) by mouth daily. 30 tablet 1  . SYMBICORT 160-4.5 MCG/ACT inhaler INHALE 2 PUFFS BY MOUTH TWICE DAILY 10.2 g 11   No current facility-administered medications on file prior to visit.        Review of Systems Constitutional:   No  weight loss, night sweats,  Fevers, chills,  +fatigue, or  lassitude.  HEENT:   No headaches,  Difficulty swallowing,   Tooth/dental problems, or  Sore throat,                No sneezing, itching, ear ache, nasal congestion,  +post nasal drip,   CV:  No chest pain,  Orthopnea, PND, swelling in lower extremities, anasarca, dizziness, palpitations, syncope.   GI  No heartburn, indigestion, abdominal pain, nausea, vomiting, diarrhea, change in bowel habits, loss of appetite, bloody stools.   Resp:    No chest wall deformity  Skin: no rash or lesions.  GU: no dysuria, change in color of urine, no urgency or frequency.  No flank pain, no hematuria   MS:  No joint pain or swelling.  No decreased range of motion.  No back pain.  Psych:  No change in mood or affect. No depression or anxiety.  No memory loss.         Objective:   Physical Exam Vitals:   01/19/16 1120  BP: 106/68  Pulse: 74  Temp: 98 F (36.7 C)  TempSrc: Oral  SpO2: 95%  Weight: 144 lb (65.3 kg)  Height: 5\' 7"  (1.702 m)    GEN: A/Ox3; pleasant , NAD, elderly , chronically ill appearing    HEENT:  [place]/AT,  EACs-clear, TMs-wnl, NOSE-clear, THROAT-clear, no lesions, no postnasal drip or exudate noted.   NECK:  Supple w/ fair ROM; no JVD; normal carotid impulses w/o bruits; no thyromegaly or nodules palpated; no lymphadenopathy.    RESP  Decreased BS in bases  no accessory muscle use, no dullness to percussion  CARD:  RRR, no m/r/g  , no peripheral edema, pulses intact, no cyanosis or clubbing.  GI:   Soft & nt; nml bowel sounds; no organomegaly or masses detected.   Musco: Warm bil, no deformities or joint swelling noted.   Neuro: alert, no focal deficits noted.    Skin: Warm, no lesions or rashes   Tammy Parrett NP-C  [facility]   01/19/2016

## 2016-01-19 NOTE — Addendum Note (Signed)
Addended by: Osa Craver on: 01/19/2016 12:19 PM   Modules accepted: Orders

## 2016-02-03 ENCOUNTER — Encounter: Payer: Self-pay | Admitting: Family

## 2016-02-03 ENCOUNTER — Ambulatory Visit (INDEPENDENT_AMBULATORY_CARE_PROVIDER_SITE_OTHER): Payer: Medicare Other | Admitting: Family

## 2016-02-03 VITALS — BP 142/88 | HR 74 | Temp 98.0°F | Resp 16 | Ht 67.0 in | Wt 145.8 lb

## 2016-02-03 DIAGNOSIS — J441 Chronic obstructive pulmonary disease with (acute) exacerbation: Secondary | ICD-10-CM

## 2016-02-03 MED ORDER — PREDNISONE 20 MG PO TABS: 20.0000 mg | ORAL_TABLET | Freq: Two times a day (BID) | ORAL | 0 refills | Status: DC

## 2016-02-03 MED ORDER — AMOXICILLIN-POT CLAVULANATE 875-125 MG PO TABS: 1.0000 | ORAL_TABLET | Freq: Two times a day (BID) | ORAL | 0 refills | Status: DC

## 2016-02-03 NOTE — Progress Notes (Signed)
Subjective:    Patient ID: Diane Patterson, female    DOB: 1946-03-22, 70 y.o.   MRN: [id number]  Chief Complaint  Patient presents with  . COPD    she has alot of congestion and has COPD has trouble breathing and would like prednisone    HPI:  Diane Patterson is a 70 y.o. female who  has a past medical history of Asthma; Chronic airflow obstruction ([place]); Hypertension; Tobacco abuse; and Venous insufficiency. and presents today for an acute office visit.   Associated symptom of congestion and increased shortness of breath has been going on for about 2 weeks. No fevers. Severity was enough she could not have a dental procedure performed because of congestion and shortness of breath. Modifying factors include albuterol and Symbicort which have not helped very much. No recent antibiotic use.   No Known Allergies   Current Outpatient Prescriptions on File Prior to Visit  Medication Sig Dispense Refill  . albuterol (PROVENTIL HFA;VENTOLIN HFA) 108 (90 Base) MCG/ACT inhaler Inhale 2 puffs into the lungs every 4 (four) hours as needed for wheezing or shortness of breath. 1 Inhaler 3  . budesonide-formoterol (SYMBICORT) 160-4.5 MCG/ACT inhaler Inhale 2 puffs into the lungs 2 (two) times daily. 10.2 g 11  . diltiazem (CARDIZEM SR) 90 MG 12 hr capsule TAKE 1 CAPSULE BY MOUTH DAILY 90 capsule 3  . fluorouracil (EFUDEX) 5 % cream Apply 1 application topically daily.    . hydrochlorothiazide (MICROZIDE) 12.5 MG capsule Take 1 capsule (12.5 mg total) by mouth daily. 90 capsule 1  . potassium chloride SA (K-DUR,KLOR-CON) 20 MEQ tablet Take 1 tablet (20 mEq total) by mouth daily. 30 tablet 1   No current facility-administered medications on file prior to visit.      Review of Systems  Constitutional: Negative for chills and fever.  HENT: Positive for congestion. Negative for ear pain and sore throat.   Respiratory: Positive for cough and shortness of breath. Negative for chest  tightness.       Objective:    BP (!) 142/88 (BP Location: Left Arm, Patient Position: Sitting, Cuff Size: Normal)   Pulse 74   Temp 98 F (36.7 C) (Oral)   Resp 16   Ht 5\' 7"  (1.702 m)   Wt 145 lb 12.8 oz (66.1 kg)   SpO2 96%   BMI 22.84 kg/m  Nursing note and vital signs reviewed.  Physical Exam  Constitutional: She is oriented to person, place, and time. She appears well-developed and well-nourished. No distress.  HENT:  Right Ear: Hearing, tympanic membrane, external ear and ear canal normal.  Left Ear: Hearing, tympanic membrane, external ear and ear canal normal.  Nose: Nose normal.  Mouth/Throat: Uvula is midline, oropharynx is clear and moist and mucous membranes are normal.  Cardiovascular: Normal rate, regular rhythm, normal heart sounds and intact distal pulses.   Pulmonary/Chest: Effort normal. No respiratory distress. She has wheezes. She has no rales. She exhibits no tenderness.  Neurological: She is alert and oriented to person, place, and time.  Skin: Skin is warm and dry.  Psychiatric: She has a normal mood and affect. Her behavior is normal. Judgment and thought content normal.       Assessment & Plan:   Problem List Items Addressed This Visit      Respiratory   COPD exacerbation ([place]) - Primary    Symptoms and exam consistent with COPD exacerbation given increased congestion and shortness of breath. Start Augmentin. Start prednisone. Continue  over-the-counter medications as needed for symptom relief and supportive care. Follow-up if symptoms worsen or fail to improve.      Relevant Medications   amoxicillin-clavulanate (AUGMENTIN) 875-125 MG tablet   predniSONE (DELTASONE) 20 MG tablet    Other Visit Diagnoses   None.      I am having Ms. Hilgeman start on amoxicillin-clavulanate and predniSONE. I am also having her maintain her fluorouracil, albuterol, diltiazem, hydrochlorothiazide, potassium chloride SA, and budesonide-formoterol.   Meds  ordered this encounter  Medications  . amoxicillin-clavulanate (AUGMENTIN) 875-125 MG tablet    Sig: Take 1 tablet by mouth 2 (two) times daily.    Dispense:  20 tablet    Refill:  0    Order Specific Question:   Supervising Provider    Answer:   Pricilla Holm A [id number]  . predniSONE (DELTASONE) 20 MG tablet    Sig: Take 1 tablet (20 mg total) by mouth 2 (two) times daily with a meal.    Dispense:  14 tablet    Refill:  0    Order Specific Question:   Supervising Provider    Answer:   Pricilla Holm A [id number]     Follow-up: Return if symptoms worsen or fail to improve.  Mauricio Po, FNP

## 2016-02-03 NOTE — Assessment & Plan Note (Signed)
Symptoms and exam consistent with COPD exacerbation given increased congestion and shortness of breath. Start Augmentin. Start prednisone. Continue over-the-counter medications as needed for symptom relief and supportive care. Follow-up if symptoms worsen or fail to improve.

## 2016-02-03 NOTE — Patient Instructions (Signed)
Thank you for choosing [facility].  Summary/Instructions:  Your prescription(s) have been submitted to your pharmacy or been printed and provided for you. Please take as directed and contact our office if you believe you are having problem(s) with the medication(s) or have any questions.  If your symptoms worsen or fail to improve, please contact our office for further instruction, or in case of emergency go directly to the emergency room at the closest medical facility.   Chronic Obstructive Pulmonary Disease Chronic obstructive pulmonary disease (COPD) is a common lung condition in which airflow from the lungs is limited. COPD is a general term that can be used to describe many different lung problems that limit airflow, including both chronic bronchitis and emphysema. If you have COPD, your lung function will probably never return to normal, but there are measures you can take to improve lung function and make yourself feel better. CAUSES   Smoking (common).  Exposure to secondhand smoke.  Genetic problems.  Chronic inflammatory lung diseases or recurrent infections. SYMPTOMS  Shortness of breath, especially with physical activity.  Deep, persistent (chronic) cough with a large amount of thick mucus.  Wheezing.  Rapid breaths (tachypnea).  Gray or bluish discoloration (cyanosis) of the skin, especially in your fingers, toes, or lips.  Fatigue.  Weight loss.  Frequent infections or episodes when breathing symptoms become much worse (exacerbations).  Chest tightness. DIAGNOSIS Your health care provider will take a medical history and perform a physical examination to diagnose COPD. Additional tests for COPD may include:  Lung (pulmonary) function tests.  Chest X-ray.  CT scan.  Blood tests. TREATMENT  Treatment for COPD may include:  Inhaler and nebulizer medicines. These help manage the symptoms of COPD and make your breathing more  comfortable.  Supplemental oxygen. Supplemental oxygen is only helpful if you have a low oxygen level in your blood.  Exercise and physical activity. These are beneficial for nearly all people with COPD.  Lung surgery or transplant.  Nutrition therapy to gain weight, if you are underweight.  Pulmonary rehabilitation. This may involve working with a team of health care providers and specialists, such as respiratory, occupational, and physical therapists. HOME CARE INSTRUCTIONS  Take all medicines (inhaled or pills) as directed by your health care provider.  Avoid over-the-counter medicines or cough syrups that dry up your airway (such as antihistamines) and slow down the elimination of secretions unless instructed otherwise by your health care provider.  If you are a smoker, the most important thing that you can do is stop smoking. Continuing to smoke will cause further lung damage and breathing trouble. Ask your health care provider for help with quitting smoking. He or she can direct you to community resources or hospitals that provide support.  Avoid exposure to irritants such as smoke, chemicals, and fumes that aggravate your breathing.  Use oxygen therapy and pulmonary rehabilitation if directed by your health care provider. If you require home oxygen therapy, ask your health care provider whether you should purchase a pulse oximeter to measure your oxygen level at home.  Avoid contact with individuals who have a contagious illness.  Avoid extreme temperature and humidity changes.  Eat healthy foods. Eating smaller, more frequent meals and resting before meals may help you maintain your strength.  Stay active, but balance activity with periods of rest. Exercise and physical activity will help you maintain your ability to do things you want to do.  Preventing infection and hospitalization is very important when you have  COPD. Make sure to receive all the vaccines your health care  provider recommends, especially the pneumococcal and influenza vaccines. Ask your health care provider whether you need a pneumonia vaccine.  Learn and use relaxation techniques to manage stress.  Learn and use controlled breathing techniques as directed by your health care provider. Controlled breathing techniques include:  Pursed lip breathing. Start by breathing in (inhaling) through your nose for 1 second. Then, purse your lips as if you were going to whistle and breathe out (exhale) through the pursed lips for 2 seconds.  Diaphragmatic breathing. Start by putting one hand on your abdomen just above your waist. Inhale slowly through your nose. The hand on your abdomen should move out. Then purse your lips and exhale slowly. You should be able to feel the hand on your abdomen moving in as you exhale.  Learn and use controlled coughing to clear mucus from your lungs. Controlled coughing is a series of short, progressive coughs. The steps of controlled coughing are: 1. Lean your head slightly forward. 2. Breathe in deeply using diaphragmatic breathing. 3. Try to hold your breath for 3 seconds. 4. Keep your mouth slightly open while coughing twice. 5. Spit any mucus out into a tissue. 6. Rest and repeat the steps once or twice as needed. SEEK MEDICAL CARE IF:  You are coughing up more mucus than usual.  There is a change in the color or thickness of your mucus.  Your breathing is more labored than usual.  Your breathing is faster than usual. SEEK IMMEDIATE MEDICAL CARE IF:  You have shortness of breath while you are resting.  You have shortness of breath that prevents you from:  Being able to talk.  Performing your usual physical activities.  You have chest pain lasting longer than 5 minutes.  Your skin color is more cyanotic than usual.  You measure low oxygen saturations for longer than 5 minutes with a pulse oximeter. MAKE SURE YOU:  Understand these  instructions.  Will watch your condition.  Will get help right away if you are not doing well or get worse.   This information is not intended to replace advice given to you by your health care provider. Make sure you discuss any questions you have with your health care provider.   Document Released: 03/10/2005 Document Revised: 06/21/2014 Document Reviewed: 01/25/2013 Elsevier Interactive Patient Education [employer].

## 2016-03-05 ENCOUNTER — Encounter: Payer: Self-pay | Admitting: Family

## 2016-03-05 ENCOUNTER — Ambulatory Visit (INDEPENDENT_AMBULATORY_CARE_PROVIDER_SITE_OTHER): Payer: Medicare Other | Admitting: Family

## 2016-03-05 VITALS — BP 136/80 | HR 94 | Temp 98.2°F | Resp 20 | Ht 67.0 in | Wt 144.0 lb

## 2016-03-05 DIAGNOSIS — Z23 Encounter for immunization: Secondary | ICD-10-CM

## 2016-03-05 DIAGNOSIS — J441 Chronic obstructive pulmonary disease with (acute) exacerbation: Secondary | ICD-10-CM | POA: Diagnosis not present

## 2016-03-05 MED ORDER — LEVOFLOXACIN 500 MG PO TABS: 500.0000 mg | ORAL_TABLET | Freq: Every day | ORAL | 0 refills | Status: DC

## 2016-03-05 MED ORDER — PREDNISONE 20 MG PO TABS: 20.0000 mg | ORAL_TABLET | Freq: Two times a day (BID) | ORAL | 0 refills | Status: DC

## 2016-03-05 MED ORDER — BUDESONIDE-FORMOTEROL FUMARATE 160-4.5 MCG/ACT IN AERO: 2.0000 | INHALATION_SPRAY | Freq: Two times a day (BID) | RESPIRATORY_TRACT | 11 refills | Status: DC

## 2016-03-05 NOTE — Patient Instructions (Signed)
Thank you for choosing [facility].  SUMMARY AND INSTRUCTIONS:  Please continue to take your medications as prescribed.   Medication:  Your prescription(s) have been submitted to your pharmacy or been printed and provided for you. Please take as directed and contact our office if you believe you are having problem(s) with the medication(s) or have any questions.  Follow up:  If your symptoms worsen or fail to improve, please contact our office for further instruction, or in case of emergency go directly to the emergency room at the closest medical facility.   General Recommendations:    Please drink plenty of fluids.  Get plenty of rest   Sleep in humidified air  Use saline nasal sprays  Netti pot   OTC Medications:  Decongestants - helps relieve congestion   Flonase (generic fluticasone) or Nasacort (generic triamcinolone) - please make sure to use the "cross-over" technique at a 45 degree angle towards the opposite eye as opposed to straight up the nasal passageway.   Sudafed (generic pseudoephedrine - Note this is the one that is available behind the pharmacy counter); Products with phenylephrine (-PE) may also be used but is often not as effective as pseudoephedrine.   If you have HIGH BLOOD PRESSURE - Coricidin HBP; AVOID any product that is -D as this contains pseudoephedrine which may increase your blood pressure.  Afrin (oxymetazoline) every 6-8 hours for up to 3 days.   Allergies - helps relieve runny nose, itchy eyes and sneezing   Claritin (generic loratidine), Allegra (fexofenidine), or Zyrtec (generic cyrterizine) for runny nose. These medications should not cause drowsiness.  Note - Benadryl (generic diphenhydramine) may be used however may cause drowsiness  Cough -   Delsym or Robitussin (generic dextromethorphan)  Expectorants - helps loosen mucus to ease removal   Mucinex (generic guaifenesin) as directed on the package.  Headaches /  General Aches   Tylenol (generic acetaminophen) - DO NOT EXCEED 3 grams (3,000 mg) in a 24 hour time period  Advil/Motrin (generic ibuprofen)   Sore Throat -   Salt water gargle   Chloraseptic (generic benzocaine) spray or lozenges / Sucrets (generic dyclonine)

## 2016-03-05 NOTE — Assessment & Plan Note (Signed)
Symptoms and exam consistent with COPD exacerbation with increased shortness of breath and sputum production. Start levofloxacin. Start prednisone. Continue current dosage of Symbicort and albuterol. Follow-up if symptoms worsen or do not improve.

## 2016-03-05 NOTE — Progress Notes (Signed)
Subjective:    Patient ID: Diane Patterson, female    DOB: 02-13-1946, 70 y.o.   MRN: [id number]  Chief Complaint  Patient presents with  . COPD    x2 days, SOB, some wheezing, congestion, has COPD     HPI:  Diane Patterson is a 70 y.o. female who  has a past medical history of Asthma; Chronic airflow obstruction ([place]); Hypertension; Tobacco abuse; and Venous insufficiency. and presents today for an acute office visit.   Associated symptoms of shortness of breath, wheezing, and congestion have been going on for about 2 days. No fevers. Endorses increased mucus production. Modifying factors include Benedryl which does help with her congestion. She is requiring additional pillows to sleep and is unable to lie flat. She is currently maintained on Symbicort.    No Known Allergies    Outpatient Medications Prior to Visit  Medication Sig Dispense Refill  . albuterol (PROVENTIL HFA;VENTOLIN HFA) 108 (90 Base) MCG/ACT inhaler Inhale 2 puffs into the lungs every 4 (four) hours as needed for wheezing or shortness of breath. 1 Inhaler 3  . diltiazem (CARDIZEM SR) 90 MG 12 hr capsule TAKE 1 CAPSULE BY MOUTH DAILY 90 capsule 3  . fluorouracil (EFUDEX) 5 % cream Apply 1 application topically daily.    . hydrochlorothiazide (MICROZIDE) 12.5 MG capsule Take 1 capsule (12.5 mg total) by mouth daily. 90 capsule 1  . potassium chloride SA (K-DUR,KLOR-CON) 20 MEQ tablet Take 1 tablet (20 mEq total) by mouth daily. 30 tablet 1  . amoxicillin-clavulanate (AUGMENTIN) 875-125 MG tablet Take 1 tablet by mouth 2 (two) times daily. 20 tablet 0  . budesonide-formoterol (SYMBICORT) 160-4.5 MCG/ACT inhaler Inhale 2 puffs into the lungs 2 (two) times daily. 10.2 g 11  . predniSONE (DELTASONE) 20 MG tablet Take 1 tablet (20 mg total) by mouth 2 (two) times daily with a meal. 14 tablet 0   No facility-administered medications prior to visit.      Review of Systems  Constitutional: Negative for chills  and fever.  HENT: Positive for congestion. Negative for ear pain, sinus pressure, sneezing and sore throat.   Respiratory: Positive for cough, shortness of breath and wheezing. Negative for chest tightness.   Neurological: Negative for headaches.      Objective:    BP 136/80 (BP Location: Left Arm, Patient Position: Sitting, Cuff Size: Normal)   Pulse 94   Temp 98.2 F (36.8 C) (Oral)   Resp 20   Ht 5\' 7"  (1.702 m)   Wt 144 lb (65.3 kg)   SpO2 (!) 6%   BMI 22.55 kg/m  Nursing note and vital signs reviewed.  Physical Exam  Constitutional: She is oriented to person, place, and time. She appears well-developed and well-nourished. No distress.  HENT:  Right Ear: Hearing, tympanic membrane, external ear and ear canal normal.  Left Ear: Hearing, tympanic membrane, external ear and ear canal normal.  Nose: Nose normal.  Mouth/Throat: Uvula is midline, oropharynx is clear and moist and mucous membranes are normal.  Cardiovascular: Normal rate, regular rhythm, normal heart sounds and intact distal pulses.   Pulmonary/Chest: Effort normal. She has wheezes. She has no rales.  Neurological: She is alert and oriented to person, place, and time.  Skin: Skin is warm and dry.  Psychiatric: She has a normal mood and affect. Her behavior is normal. Judgment and thought content normal.       Assessment & Plan:   Problem List Items Addressed This Visit  Respiratory   COPD exacerbation ([place]) - Primary    Symptoms and exam consistent with COPD exacerbation with increased shortness of breath and sputum production. Start levofloxacin. Start prednisone. Continue current dosage of Symbicort and albuterol. Follow-up if symptoms worsen or do not improve.       Relevant Medications   budesonide-formoterol (SYMBICORT) 160-4.5 MCG/ACT inhaler   levofloxacin (LEVAQUIN) 500 MG tablet   predniSONE (DELTASONE) 20 MG tablet    Other Visit Diagnoses    Encounter for immunization       Relevant  Orders   Flu vaccine HIGH DOSE PF (Completed)       I have discontinued [name]'s amoxicillin-clavulanate. I am also having her start on levofloxacin. Additionally, I am having her maintain her fluorouracil, albuterol, diltiazem, hydrochlorothiazide, potassium chloride SA, budesonide-formoterol, and predniSONE.   Meds ordered this encounter  Medications  . budesonide-formoterol (SYMBICORT) 160-4.5 MCG/ACT inhaler    Sig: Inhale 2 puffs into the lungs 2 (two) times daily.    Dispense:  10.2 g    Refill:  11    Order Specific Question:   Supervising Provider    Answer:   Pricilla Holm A [id number]  . levofloxacin (LEVAQUIN) 500 MG tablet    Sig: Take 1 tablet (500 mg total) by mouth daily.    Dispense:  7 tablet    Refill:  0    Order Specific Question:   Supervising Provider    Answer:   Pricilla Holm A [id number]  . predniSONE (DELTASONE) 20 MG tablet    Sig: Take 1 tablet (20 mg total) by mouth 2 (two) times daily with a meal.    Dispense:  14 tablet    Refill:  0    Order Specific Question:   Supervising Provider    Answer:   Pricilla Holm A [id number]     Follow-up: Return if symptoms worsen or fail to improve.  Mauricio Po, FNP

## 2016-03-15 ENCOUNTER — Telehealth: Payer: Self-pay | Admitting: Adult Health

## 2016-03-15 NOTE — Telephone Encounter (Signed)
Error wrong pt this is for husband, she gave me her infor.Diane Patterson

## 2016-05-18 ENCOUNTER — Telehealth: Payer: Self-pay | Admitting: *Deleted

## 2016-05-18 DIAGNOSIS — J441 Chronic obstructive pulmonary disease with (acute) exacerbation: Secondary | ICD-10-CM

## 2016-05-18 NOTE — Telephone Encounter (Signed)
Left msg on triage stating she has COPD and needing to get a refill on the prednisone...Johny Chess

## 2016-05-19 MED ORDER — PREDNISONE 20 MG PO TABS: 20.0000 mg | ORAL_TABLET | Freq: Two times a day (BID) | ORAL | 0 refills | Status: DC

## 2016-05-19 NOTE — Telephone Encounter (Signed)
Pls advise on msg below.../lmb 

## 2016-05-19 NOTE — Telephone Encounter (Signed)
Called pt no answer LMOM rx sent [facility].../lmb 

## 2016-05-19 NOTE — Telephone Encounter (Signed)
Medication sent to pharmacy  

## 2016-06-04 ENCOUNTER — Ambulatory Visit (INDEPENDENT_AMBULATORY_CARE_PROVIDER_SITE_OTHER): Payer: Medicare Other | Admitting: Family

## 2016-06-04 ENCOUNTER — Encounter: Payer: Self-pay | Admitting: Family

## 2016-06-04 DIAGNOSIS — J441 Chronic obstructive pulmonary disease with (acute) exacerbation: Secondary | ICD-10-CM | POA: Diagnosis not present

## 2016-06-04 MED ORDER — LEVOFLOXACIN 500 MG PO TABS: 500.0000 mg | ORAL_TABLET | Freq: Every day | ORAL | 0 refills | Status: DC

## 2016-06-04 MED ORDER — PREDNISONE 20 MG PO TABS: 20.0000 mg | ORAL_TABLET | Freq: Two times a day (BID) | ORAL | 0 refills | Status: DC

## 2016-06-04 NOTE — Progress Notes (Signed)
Subjective:    Patient ID: Diane Patterson, female    DOB: 1946/04/19, 70 y.o.   MRN: [id number]  Chief Complaint  Patient presents with  . Nasal Congestion    chest congestion, drainage, feels the congestion in chest, little bit of a cough, thinks she is have issues with her COPD, x2 weeks    HPI:  Diane Patterson is a 70 y.o. female who  has a past medical history of Asthma; Chronic airflow obstruction ([place]); Hypertension; Tobacco abuse; and Venous insufficiency. and presents today for an acute office visit.  This is a new problem. Associated symptoms of congestion and cough has been going on for about 2 weeks. No fevers. Has had some shortness of breath and increased mucus production. Expresses some myalgias. Modifying factors include Symbicort and albuterol which help a little. She has also taken some prednisone which has also helped. No recent antibiotics. No increase need for albuterol.    No Known Allergies    Outpatient Medications Prior to Visit  Medication Sig Dispense Refill  . albuterol (PROVENTIL HFA;VENTOLIN HFA) 108 (90 Base) MCG/ACT inhaler Inhale 2 puffs into the lungs every 4 (four) hours as needed for wheezing or shortness of breath. 1 Inhaler 3  . budesonide-formoterol (SYMBICORT) 160-4.5 MCG/ACT inhaler Inhale 2 puffs into the lungs 2 (two) times daily. 10.2 g 11  . diltiazem (CARDIZEM SR) 90 MG 12 hr capsule TAKE 1 CAPSULE BY MOUTH DAILY 90 capsule 3  . fluorouracil (EFUDEX) 5 % cream Apply 1 application topically daily.    . hydrochlorothiazide (MICROZIDE) 12.5 MG capsule Take 1 capsule (12.5 mg total) by mouth daily. 90 capsule 1  . predniSONE (DELTASONE) 20 MG tablet Take 1 tablet (20 mg total) by mouth 2 (two) times daily with a meal. 14 tablet 0  . levofloxacin (LEVAQUIN) 500 MG tablet Take 1 tablet (500 mg total) by mouth daily. 7 tablet 0  . potassium chloride SA (K-DUR,KLOR-CON) 20 MEQ tablet Take 1 tablet (20 mEq total) by mouth daily. 30 tablet  1   No facility-administered medications prior to visit.      Review of Systems  Constitutional: Negative for chills and fever.  HENT: Positive for congestion. Negative for ear pain, rhinorrhea and sore throat.   Respiratory: Positive for cough, chest tightness and shortness of breath.       Objective:    BP (!) 144/96 (BP Location: Left Arm, Patient Position: Sitting, Cuff Size: Normal)   Pulse 73   Temp 98 F (36.7 C) (Oral)   Resp 16   Ht 5\' 7"  (1.702 m)   Wt 142 lb (64.4 kg)   SpO2 94%   BMI 22.24 kg/m  Nursing note and vital signs reviewed.  Physical Exam  Constitutional: She is oriented to person, place, and time. She appears well-developed and well-nourished.  Non-toxic appearance. She does not have a sickly appearance. She does not appear ill. No distress.  HENT:  Right Ear: Hearing, tympanic membrane, external ear and ear canal normal.  Left Ear: Hearing, tympanic membrane, external ear and ear canal normal.  Nose: Nose normal.  Mouth/Throat: Uvula is midline, oropharynx is clear and moist and mucous membranes are normal.  Cardiovascular: Normal rate, regular rhythm, normal heart sounds and intact distal pulses.   Pulmonary/Chest: Effort normal. No respiratory distress. She has wheezes. She has no rales. She exhibits no tenderness.  Neurological: She is alert and oriented to person, place, and time.  Skin: Skin is warm and dry.  Psychiatric: She has a normal mood and affect. Her behavior is normal. Judgment and thought content normal.       Assessment & Plan:   Problem List Items Addressed This Visit      Respiratory   COPD exacerbation ([place])    Symptoms and exam consistent with mild COPD exacerbation although cannot rule out acute upper respiratory infection with no increased need for rescue inhaler. Continue conservative treatment with over-the-counter medications as needed for symptom relief and supportive care. Refill prednisone. Written prescription for  levofloxacin provided if symptoms worsen or do not improve.      Relevant Medications   levofloxacin (LEVAQUIN) 500 MG tablet   predniSONE (DELTASONE) 20 MG tablet       I have discontinued Ms. Boike's potassium chloride SA. I am also having her maintain her fluorouracil, albuterol, diltiazem, hydrochlorothiazide, budesonide-formoterol, levofloxacin, and predniSONE.   Meds ordered this encounter  Medications  . levofloxacin (LEVAQUIN) 500 MG tablet    Sig: Take 1 tablet (500 mg total) by mouth daily.    Dispense:  7 tablet    Refill:  0    Order Specific Question:   Supervising Provider    Answer:   Pricilla Holm A [id number]  . predniSONE (DELTASONE) 20 MG tablet    Sig: Take 1 tablet (20 mg total) by mouth 2 (two) times daily with a meal.    Dispense:  14 tablet    Refill:  0    Order Specific Question:   Supervising Provider    Answer:   Pricilla Holm A [id number]     Follow-up: Return if symptoms worsen or fail to improve.  Mauricio Po, FNP

## 2016-06-04 NOTE — Assessment & Plan Note (Signed)
Symptoms and exam consistent with mild COPD exacerbation although cannot rule out acute upper respiratory infection with no increased need for rescue inhaler. Continue conservative treatment with over-the-counter medications as needed for symptom relief and supportive care. Refill prednisone. Written prescription for levofloxacin provided if symptoms worsen or do not improve.

## 2016-06-04 NOTE — Patient Instructions (Addendum)
Thank you for choosing [facility].  SUMMARY AND INSTRUCTIONS:  Please continue to take your medication as needed.  If your symptoms worsen, please start the levofloxacin.  Medication:  Your prescription(s) have been submitted to your pharmacy or been printed and provided for you. Please take as directed and contact our office if you believe you are having problem(s) with the medication(s) or have any questions.  Follow up:  If your symptoms worsen or fail to improve, please contact our office for further instruction, or in case of emergency go directly to the emergency room at the closest medical facility.   General Recommendations:    Please drink plenty of fluids.  Get plenty of rest   Sleep in humidified air  Use saline nasal sprays  Netti pot   OTC Medications:  Decongestants - helps relieve congestion   Flonase (generic fluticasone) or Nasacort (generic triamcinolone) - please make sure to use the "cross-over" technique at a 45 degree angle towards the opposite eye as opposed to straight up the nasal passageway.   Sudafed (generic pseudoephedrine - Note this is the one that is available behind the pharmacy counter); Products with phenylephrine (-PE) may also be used but is often not as effective as pseudoephedrine.   If you have HIGH BLOOD PRESSURE - Coricidin HBP; AVOID any product that is -D as this contains pseudoephedrine which may increase your blood pressure.  Afrin (oxymetazoline) every 6-8 hours for up to 3 days.   Allergies - helps relieve runny nose, itchy eyes and sneezing   Claritin (generic loratidine), Allegra (fexofenidine), or Zyrtec (generic cyrterizine) for runny nose. These medications should not cause drowsiness.  Note - Benadryl (generic diphenhydramine) may be used however may cause drowsiness  Cough -   Delsym or Robitussin (generic dextromethorphan)  Expectorants - helps loosen mucus to ease removal   Mucinex (generic  guaifenesin) as directed on the package.  Headaches / General Aches   Tylenol (generic acetaminophen) - DO NOT EXCEED 3 grams (3,000 mg) in a 24 hour time period  Advil/Motrin (generic ibuprofen)   Sore Throat -   Salt water gargle   Chloraseptic (generic benzocaine) spray or lozenges / Sucrets (generic dyclonine)

## 2016-06-29 ENCOUNTER — Encounter: Payer: Self-pay | Admitting: Family

## 2016-06-29 ENCOUNTER — Ambulatory Visit (INDEPENDENT_AMBULATORY_CARE_PROVIDER_SITE_OTHER): Payer: Medicare Other | Admitting: Family

## 2016-06-29 ENCOUNTER — Telehealth: Payer: Self-pay | Admitting: *Deleted

## 2016-06-29 VITALS — BP 144/90 | HR 89 | Temp 98.4°F | Resp 18 | Ht 67.0 in | Wt 146.0 lb

## 2016-06-29 DIAGNOSIS — J441 Chronic obstructive pulmonary disease with (acute) exacerbation: Secondary | ICD-10-CM

## 2016-06-29 MED ORDER — UMECLIDINIUM BROMIDE 62.5 MCG/INH IN AEPB: 1.0000 | INHALATION_SPRAY | Freq: Every day | RESPIRATORY_TRACT | 0 refills | Status: DC

## 2016-06-29 MED ORDER — AMOXICILLIN-POT CLAVULANATE 875-125 MG PO TABS: 1.0000 | ORAL_TABLET | Freq: Two times a day (BID) | ORAL | 0 refills | Status: DC

## 2016-06-29 MED ORDER — PREDNISONE 20 MG PO TABS: ORAL_TABLET | ORAL | 0 refills | Status: DC

## 2016-06-29 NOTE — Telephone Encounter (Signed)
Pt left msg on triage yesterday @ 10:21 am stating she has COPD, and needing [facility]

## 2016-06-29 NOTE — Assessment & Plan Note (Signed)
Symptoms and exam consistent with COPD exacerbation. Start Augmentin and prednisone taper. Continue current dosage of Symbicort. Start Incruse. Continue albuterol as needed. When improved, recommend pulmonary function test and possible referral to pulmonology if continues to have symptoms/exacerbations.

## 2016-06-29 NOTE — Patient Instructions (Signed)
Thank you for choosing [facility].  SUMMARY AND INSTRUCTIONS:   Medication:  Please start the Augmentin and prednisone.   Start the Incruse daily  Your prescription(s) have been submitted to your pharmacy or been printed and provided for you. Please take as directed and contact our office if you believe you are having problem(s) with the medication(s) or have any questions.  Follow up:  If your symptoms worsen or fail to improve, please contact our office for further instruction, or in case of emergency go directly to the emergency room at the closest medical facility.

## 2016-06-29 NOTE — Progress Notes (Signed)
Subjective:    Patient ID: Diane Patterson, female    DOB: [birth year], 70 y.o.   MRN: [id number]  Chief Complaint  Patient presents with  . Follow-up    having SOB and states she has a thick mucous in her throat, COPD acting up again    HPI:  Diane Patterson is a 71 y.o. female who  has a past medical history of Asthma; Chronic airflow obstruction ([place]); Hypertension; Tobacco abuse; and Venous insufficiency. and presents today for an acute office visit.  This is an exacerbation of a chronic problem. Associated symptoms of shortness of breath and thick mucus production has been going on for about 3 weeks. Modifying factors include prednisone which does help with her symptoms. Denies any fevers. Has had to use her albuterol inhaler a little more than normal. Continues to use the Symbicort as prescribed and denies adverse side effects.   No Known Allergies    Outpatient Medications Prior to Visit  Medication Sig Dispense Refill  . albuterol (PROVENTIL HFA;VENTOLIN HFA) 108 (90 Base) MCG/ACT inhaler Inhale 2 puffs into the lungs every 4 (four) hours as needed for wheezing or shortness of breath. 1 Inhaler 3  . budesonide-formoterol (SYMBICORT) 160-4.5 MCG/ACT inhaler Inhale 2 puffs into the lungs 2 (two) times daily. 10.2 g 11  . diltiazem (CARDIZEM SR) 90 MG 12 hr capsule TAKE 1 CAPSULE BY MOUTH DAILY 90 capsule 3  . fluorouracil (EFUDEX) 5 % cream Apply 1 application topically daily.    . hydrochlorothiazide (MICROZIDE) 12.5 MG capsule Take 1 capsule (12.5 mg total) by mouth daily. 90 capsule 1  . levofloxacin (LEVAQUIN) 500 MG tablet Take 1 tablet (500 mg total) by mouth daily. 7 tablet 0  . predniSONE (DELTASONE) 20 MG tablet Take 1 tablet (20 mg total) by mouth 2 (two) times daily with a meal. 14 tablet 0   No facility-administered medications prior to visit.     Review of Systems  Constitutional: Negative for chills and fever.  HENT: Positive for congestion.     Respiratory: Positive for cough and shortness of breath. Negative for chest tightness and wheezing.       Objective:    BP (!) 144/90 (BP Location: Left Arm, Patient Position: Sitting, Cuff Size: Normal)   Pulse 89   Temp 98.4 F (36.9 C) (Oral)   Resp 18   Ht 5\' 7"  (1.702 m)   Wt 146 lb (66.2 kg)   SpO2 94%   BMI 22.87 kg/m  Nursing note and vital signs reviewed.  Physical Exam  Constitutional: She is oriented to person, place, and time. She appears well-developed and well-nourished. No distress.  HENT:  Right Ear: Hearing, tympanic membrane, external ear and ear canal normal.  Left Ear: Hearing, tympanic membrane, external ear and ear canal normal.  Nose: Right sinus exhibits no maxillary sinus tenderness and no frontal sinus tenderness. Left sinus exhibits no maxillary sinus tenderness and no frontal sinus tenderness.  Mouth/Throat: Uvula is midline, oropharynx is clear and moist and mucous membranes are normal.  Cardiovascular: Normal rate, regular rhythm, normal heart sounds and intact distal pulses.   Pulmonary/Chest: Effort normal. No accessory muscle usage. No tachypnea. No respiratory distress. She has decreased breath sounds. She has no wheezes. She has no rhonchi. She has no rales.  Neurological: She is alert and oriented to person, place, and time.  Skin: Skin is warm and dry.  Psychiatric: She has a normal mood and affect. Her behavior is normal. Judgment and  thought content normal.       Assessment & Plan:   Problem List Items Addressed This Visit      Respiratory   COPD exacerbation ([place]) - Primary    Symptoms and exam consistent with COPD exacerbation. Start Augmentin and prednisone taper. Continue current dosage of Symbicort. Start Incruse. Continue albuterol as needed. When improved, recommend pulmonary function test and possible referral to pulmonology if continues to have symptoms/exacerbations.       Relevant Medications   predniSONE (DELTASONE) 20 MG  tablet   umeclidinium bromide (INCRUSE ELLIPTA) 62.5 MCG/INH AEPB       I have discontinued Ms. Tedeschi's levofloxacin and predniSONE. I am also having her start on amoxicillin-clavulanate, predniSONE, and umeclidinium bromide. Additionally, I am having her maintain her fluorouracil, albuterol, diltiazem, hydrochlorothiazide, and budesonide-formoterol.   Meds ordered this encounter  Medications  . amoxicillin-clavulanate (AUGMENTIN) 875-125 MG tablet    Sig: Take 1 tablet by mouth 2 (two) times daily.    Dispense:  14 tablet    Refill:  0    Order Specific Question:   Supervising Provider    Answer:   Pricilla Holm A [id number]  . predniSONE (DELTASONE) 20 MG tablet    Sig: Take 3 tablets by mouth for 2 days, take 2 tablets by mouth for 2 days and 1 tablet by mouth for 2 days.    Dispense:  12 tablet    Refill:  0    Order Specific Question:   Supervising Provider    Answer:   Pricilla Holm A [id number]  . umeclidinium bromide (INCRUSE ELLIPTA) 62.5 MCG/INH AEPB    Sig: Inhale 1 puff into the lungs daily.    Dispense:  7 each    Refill:  0    Order Specific Question:   Supervising Provider    Answer:   Pricilla Holm A [id number]     Follow-up: Return in about 3 months (around 09/27/2016), or if symptoms worsen or fail to improve.  Mauricio Po, FNP

## 2016-06-29 NOTE — Telephone Encounter (Signed)
Patient has appointment this afternoon and will be addressed.

## 2016-07-21 ENCOUNTER — Ambulatory Visit (INDEPENDENT_AMBULATORY_CARE_PROVIDER_SITE_OTHER): Payer: Medicare Other | Admitting: Cardiology

## 2016-07-21 ENCOUNTER — Encounter: Payer: Self-pay | Admitting: *Deleted

## 2016-07-21 ENCOUNTER — Encounter: Payer: Self-pay | Admitting: Cardiology

## 2016-07-21 ENCOUNTER — Encounter (INDEPENDENT_AMBULATORY_CARE_PROVIDER_SITE_OTHER): Payer: Self-pay

## 2016-07-21 VITALS — BP 136/86 | HR 74 | Ht 67.0 in | Wt 139.0 lb

## 2016-07-21 DIAGNOSIS — I209 Angina pectoris, unspecified: Secondary | ICD-10-CM | POA: Diagnosis not present

## 2016-07-21 DIAGNOSIS — J438 Other emphysema: Secondary | ICD-10-CM

## 2016-07-21 DIAGNOSIS — R0602 Shortness of breath: Secondary | ICD-10-CM | POA: Diagnosis not present

## 2016-07-21 DIAGNOSIS — I10 Essential (primary) hypertension: Secondary | ICD-10-CM | POA: Diagnosis not present

## 2016-07-21 NOTE — Patient Instructions (Signed)
Medication Instructions:  Your physician recommends that you continue on your current medications as directed. Please refer to the Current Medication list given to you today.   Labwork: Lab work to be done today--BMP, CBC, PT  Testing/Procedures: Your physician has requested that you have a cardiac catheterization. Cardiac catheterization is used to diagnose and/or treat various heart conditions. Doctors may recommend this procedure for a number of different reasons. The most common reason is to evaluate chest pain. Chest pain can be a symptom of coronary artery disease (CAD), and cardiac catheterization can show whether plaque is narrowing or blocking your heart's arteries. This procedure is also used to evaluate the valves, as well as measure the blood flow and oxygen levels in different parts of your heart. For further information please visit [email or web address]. Please follow instruction sheet, as given. Scheduled for Feb 16,2018  Follow-Up: To be arranged based on results of procedure.   Any Other Special Instructions Will Be Listed Below (If Applicable).     If you need a refill on your cardiac medications before your next appointment, please call your pharmacy.

## 2016-07-21 NOTE — Progress Notes (Signed)
Cardiology Office Note    Date:  07/21/2016   ID:  [name], [name] September 25, 1945, MRN [id number]  PCP:  Mauricio Po, FNP  Cardiologist:   Candee Furbish, MD     History of Present Illness:  Diane Patterson is a 71 y.o. female with COPD, asthma, hypertension here for concerns of continued shortness of breath, chest pain, arm numbness, teeth pain.  Over the Christmas holidays, she had a horrible upper respiratory infection, was seen in the emergency department. Was hospitalized. Prior to this hospitalization, while sitting on the couch, her vision turned black. That night she had very sharp left-sided chest discomfort that was severe. She laid in bed very still for about 3 minutes and then it finally subsided. Worried her. Last week, while watching television, her vision once again turned black. She reports that she is not had any issues with low blood pressure in the past. They have been however trying to adjust her hypertension regimen. She bruises quite easily.  EKG from 06/17/15 shows sinus rhythm and left anterior fascicular block. Went to ER with SOB.   Thankfully, her nuclear stress test, event monitor reassuring. It is likely that her symptoms of shortness of breath or COPD related. No evidence of ischemia. When she coughs and brings up mucus, this does help sometimes with her shortness of breath. She did ask me why there is no cure for COPD at this point. We discussed at prior visit.  However, she comes in today with an episode of worrisome chest grabbing type pain radiating down her arm up her neck to her teeth at rest. She laid down and the symptoms subsided. This was concerning to her. She had an episode over a week ago of presumed food poisoning and has had more shortness of breath.   Past Medical History:  Diagnosis Date  . Asthma   . Chronic airflow obstruction ([facility])   . Hypertension   . Tobacco abuse   . Venous insufficiency     Past Surgical History:    Procedure Laterality Date  . cataract    . NECK SURGERY    . no colonoscopy     "afraid to "; [facility] reviewed  . VESICOVAGINAL FISTULA CLOSURE W/ TAH      Outpatient Medications Prior to Visit  Medication Sig Dispense Refill  . albuterol (PROVENTIL HFA;VENTOLIN HFA) 108 (90 Base) MCG/ACT inhaler Inhale 2 puffs into the lungs every 4 (four) hours as needed for wheezing or shortness of breath. 1 Inhaler 3  . budesonide-formoterol (SYMBICORT) 160-4.5 MCG/ACT inhaler Inhale 2 puffs into the lungs 2 (two) times daily. 10.2 g 11  . diltiazem (CARDIZEM SR) 90 MG 12 hr capsule TAKE 1 CAPSULE BY MOUTH DAILY 90 capsule 3  . fluorouracil (EFUDEX) 5 % cream Apply 1 application topically daily.    . hydrochlorothiazide (MICROZIDE) 12.5 MG capsule Take 1 capsule (12.5 mg total) by mouth daily. 90 capsule 1  . amoxicillin-clavulanate (AUGMENTIN) 875-125 MG tablet Take 1 tablet by mouth 2 (two) times daily. 14 tablet 0  . predniSONE (DELTASONE) 20 MG tablet Take 3 tablets by mouth for 2 days, take 2 tablets by mouth for 2 days and 1 tablet by mouth for 2 days. 12 tablet 0  . umeclidinium bromide (INCRUSE ELLIPTA) 62.5 MCG/INH AEPB Inhale 1 puff into the lungs daily. 7 each 0   No facility-administered medications prior to visit.      Allergies:   Patient has no known allergies.   Social  History   Social History  . Marital status: Married    Spouse name: N/A  . Number of children: 1  . Years of education: N/A   Social History Main Topics  . Smoking status: Former Smoker    Packs/day: 0.75    Years: 15.00    Types: Cigarettes    Quit date: 03/20/2014  . Smokeless tobacco: Former Systems developer    Quit date: 03/11/2014  . Alcohol use No  . Drug use: No  . Sexual activity: Not Asked   Other Topics Concern  . None   Social History Narrative  . None     Family History:  The patient's family history includes Asthma in her father; Diabetes in her mother; Emphysema in her father; Heart disease in her  father.   ROS:   Please see the history of present illness.    ROS shortness of breath, excessive fatigue, easy bruising All other systems reviewed and are negative.   PHYSICAL EXAM:   VS:  BP 136/86   Pulse 74   Ht 5\' 7"  (1.702 m)   Wt 139 lb (63 kg)   LMP  (LMP Unknown)   BMI 21.77 kg/m    GEN: Well nourished, well developed, in no acute distress  HEENT: normal  Neck: no JVD, carotid bruits, or masses Cardiac: RRR; no murmurs, rubs, or gallops,no edema , 2+ radial pulse Respiratory:  Poor air movement. No wheezes. GI: soft, nontender, nondistended, + BS MS: no deformity or atrophy  Skin: warm and dry, no rash Neuro:  Alert and Oriented x 3, Strength and sensation are intact Psych: euthymic mood, full affect  Wt Readings from Last 3 Encounters:  07/21/16 139 lb (63 kg)  06/29/16 146 lb (66.2 kg)  06/04/16 142 lb (64.4 kg)      Studies/Labs Reviewed:   EKG:  EKG today 07/21/16 shows normal sinus rhythm 74, left axis deviation, no other significant abnormality. Personally measured QT is 360 ms..    Recent Labs: 09/30/2015: ALT 31; BUN 13; Creatinine, Ser 0.86; Hemoglobin 12.4; Platelets 278.0; Potassium 3.0; Sodium 139   Lipid Panel    Component Value Date/Time   CHOL 203 (H) 02/10/2010 1017   TRIG 138.0 02/10/2010 1017   HDL 55.10 02/10/2010 1017   CHOLHDL 4 02/10/2010 1017   VLDL 27.6 02/10/2010 1017   LDLDIRECT 136.0 02/10/2010 1017    Additional studies/ records that were reviewed today include:  Prior office notes reviewed, lab work reviewed Chest x-ray shows hyperinflation consistent with COPD  NUC stress: 08/12/15: 1. This study was count-poor at rest and with Lexiscan stress. There is a fixed, small, mild apical perfusion defect with normal wall motion that is likely artifact. No definite ischemia or infarction.  2. Normal EF and wall motion.  3. Low risk study.   Reassuring  Event monitor 08/12/15: No adverse rhythms.   ASSESSMENT:    1.  Angina pectoris ([place])   2. Shortness of breath   3. Essential hypertension   4. Other emphysema ([place])      PLAN:  In order of problems listed above:  1. Angina-perhaps her shortness of breath is anginal equivalent and she did have some chest discomfort with radiation that is concerning. Despite her reassuring nuclear stress test, I think we should proceed with cardiac catheterization to have 123XX123 certainty of her coronary anatomy.  Her brother had a heart attack during a treadmill stress test so she was anxious about this. Prior episode was described as sharp  pain lasting 3 minutes across her left chest wall, this is likely musculoskeletal in etiology. New episode unable to tell. Risks and benefits of heart catheterization were discussed including stroke, heart attack, death, renal impairment, bleeding. Willing to proceed. 2. Near syncope-vision blackout. Could be from hypotension transiently. No other constellation of vagal-like symptoms. Event monitor was reassuring with no adverse arrhythmias over the two-week time. She wore it. She had to stop wearing it because of flulike symptoms. 3. COPD-poor air movement noted on exam. Inhalers. Dr. Elsworth Soho with pulmonary monitoring. 4. Essential hypertension-currently borderline elevated. 5. 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    Medication Adjustments/Labs and Tests Ordered: Current medicines are reviewed at length with the patient today.  Concerns regarding medicines are outlined above.  Medication changes, Labs and Tests ordered today are listed in the Patient Instructions below. There are no Patient Instructions on file for this visit.     Signed, Candee Furbish, MD  07/21/2016 11:35 AM    [address], [place], [place]  [address] Phone: [phone]; Fax: [phone]

## 2016-07-22 LAB — PROTIME-INR
INR: 1 (ref 0.8–1.2)
PROTHROMBIN TIME: 10.4 s (ref 9.1–12.0)

## 2016-07-22 LAB — BASIC METABOLIC PANEL
BUN/Creatinine Ratio: 16 (ref 12–28)
BUN: 14 mg/dL (ref 8–27)
CO2: 27 mmol/L (ref 18–29)
CREATININE: 0.89 mg/dL (ref 0.57–1.00)
Calcium: 9.6 mg/dL (ref 8.7–10.3)
Chloride: 94 mmol/L — ABNORMAL LOW (ref 96–106)
GFR, EST AFRICAN AMERICAN: 75 mL/min/{1.73_m2} (ref >59)
GFR, EST NON AFRICAN AMERICAN: 65 mL/min/{1.73_m2} (ref >59)
Glucose: 148 mg/dL — ABNORMAL HIGH (ref 65–99)
Potassium: 3.2 mmol/L — ABNORMAL LOW (ref 3.5–5.2)
SODIUM: 140 mmol/L (ref 134–144)

## 2016-07-22 LAB — CBC WITH DIFFERENTIAL/PLATELET
BASOS: 0 %
Basophils Absolute: 0 10*3/uL (ref 0.0–0.2)
EOS (ABSOLUTE): 0.4 10*3/uL (ref 0.0–0.4)
EOS: 4 %
HEMATOCRIT: 36.9 % (ref 34.0–46.6)
HEMOGLOBIN: 12.2 g/dL (ref 11.1–15.9)
Immature Grans (Abs): 0 10*3/uL (ref 0.0–0.1)
Immature Granulocytes: 0 %
LYMPHS ABS: 2.1 10*3/uL (ref 0.7–3.1)
Lymphs: 21 %
MCH: 30.1 pg (ref 26.6–33.0)
MCHC: 33.1 g/dL (ref 31.5–35.7)
MCV: 91 fL (ref 79–97)
MONOCYTES: 6 %
Monocytes Absolute: 0.6 10*3/uL (ref 0.1–0.9)
NEUTROS ABS: 6.8 10*3/uL (ref 1.4–7.0)
Neutrophils: 69 %
Platelets: 312 10*3/uL (ref 150–379)
RBC: 4.05 x10E6/uL (ref 3.77–5.28)
RDW: 14.3 % (ref 12.3–15.4)
WBC: 9.8 10*3/uL (ref 3.4–10.8)

## 2016-07-23 ENCOUNTER — Telehealth: Payer: Self-pay | Admitting: *Deleted

## 2016-07-23 MED ORDER — POTASSIUM CHLORIDE CRYS ER 20 MEQ PO TBCR: 20.0000 meq | EXTENDED_RELEASE_TABLET | Freq: Every day | ORAL | 11 refills | Status: DC

## 2016-07-23 NOTE — Telephone Encounter (Signed)
-----   Message from Jerline Pain, MD sent at 07/22/2016 12:22 PM EST ----- Give KCL 20 meq PO QD. OK to proceed with cath.  Candee Furbish, MD

## 2016-07-23 NOTE — Telephone Encounter (Signed)
Notes Recorded by Shellia Cleverly, RN on 07/23/2016 at 11:14 AM EST Pt aware of results and need to take potassium supplement. [facility] [facility]. Pt also states she feels "uneasy about having the heart cath" and wants to wait to have it done. She asked me to cancel it for now and she will c/b to reschedule. She reports having food poisoning recently and also having a cold with fever now. Advised I will cancel the cath as requested and make Dr Marlou Porch aware. ------  Notes Recorded by Shellia Cleverly, RN on 07/22/2016 at 5:26 PM EST Left message on VM to c/b to discuss results and medication change. ------  Notes Recorded by Jerline Pain, MD on 07/22/2016 at 12:22 PM EST Give KCL 20 meq PO QD. OK to proceed with cath.

## 2016-07-23 NOTE — Telephone Encounter (Signed)
Agree with plan. KCL. She will call us to resched. Cath.   Candee Furbish, MD

## 2016-07-30 ENCOUNTER — Encounter (HOSPITAL_COMMUNITY): Admission: RE | Payer: Self-pay | Source: Ambulatory Visit

## 2016-07-30 ENCOUNTER — Ambulatory Visit (HOSPITAL_COMMUNITY): Admission: RE | Admit: 2016-07-30 | Payer: Medicare Other | Source: Ambulatory Visit | Admitting: Internal Medicine

## 2016-07-30 SURGERY — LEFT HEART CATH AND CORONARY ANGIOGRAPHY

## 2016-08-23 ENCOUNTER — Other Ambulatory Visit: Payer: Self-pay | Admitting: Family

## 2016-08-23 DIAGNOSIS — I10 Essential (primary) hypertension: Secondary | ICD-10-CM

## 2016-08-30 ENCOUNTER — Encounter: Payer: Self-pay | Admitting: Family

## 2016-08-30 ENCOUNTER — Telehealth: Payer: Self-pay | Admitting: Pulmonary Disease

## 2016-08-30 ENCOUNTER — Ambulatory Visit (INDEPENDENT_AMBULATORY_CARE_PROVIDER_SITE_OTHER): Payer: Medicare Other | Admitting: Family

## 2016-08-30 VITALS — BP 138/90 | HR 81 | Temp 98.0°F | Resp 20 | Ht 67.0 in | Wt 145.0 lb

## 2016-08-30 DIAGNOSIS — J449 Chronic obstructive pulmonary disease, unspecified: Secondary | ICD-10-CM | POA: Diagnosis not present

## 2016-08-30 MED ORDER — FLUTICASONE-UMECLIDIN-VILANT 100-62.5-25 MCG/INH IN AEPB: 1.0000 | INHALATION_SPRAY | Freq: Every day | RESPIRATORY_TRACT | 0 refills | Status: DC

## 2016-08-30 MED ORDER — PREDNISONE 20 MG PO TABS: 20.0000 mg | ORAL_TABLET | Freq: Two times a day (BID) | ORAL | 0 refills | Status: DC

## 2016-08-30 NOTE — Progress Notes (Signed)
Subjective:    Patient ID: Diane Patterson, female    DOB: [birth year], 71 y.o.   MRN: [id number]  Chief Complaint  Patient presents with  . Shortness of Breath    wheezing and SOB     HPI:  Diane Patterson is a 71 y.o. female who  has a past medical history of Asthma; Chronic airflow obstruction ([place]); Hypertension; Tobacco abuse; and Venous insufficiency. and presents today for an acute office visit.  Associated symptom of shortness of breath and wheezing has been going on for about since last office visit. No fevers. Modifying factors include Symbicort and albuterol. Reports taking the medication as prescribed and denies adverse side effects. Severity is enough to feel short of breath with activities that takes her a while to recover. No recent antibiotics. There was improvement with previously prescribed prednisone.    No Known Allergies    Outpatient Medications Prior to Visit  Medication Sig Dispense Refill  . albuterol (PROVENTIL HFA;VENTOLIN HFA) 108 (90 Base) MCG/ACT inhaler Inhale 2 puffs into the lungs every 4 (four) hours as needed for wheezing or shortness of breath. 1 Inhaler 3  . budesonide-formoterol (SYMBICORT) 160-4.5 MCG/ACT inhaler Inhale 2 puffs into the lungs 2 (two) times daily. 10.2 g 11  . diltiazem (CARDIZEM SR) 90 MG 12 hr capsule TAKE 1 CAPSULE BY MOUTH DAILY 90 capsule 3  . fluorouracil (EFUDEX) 5 % cream Apply 1 application topically daily.    . hydrochlorothiazide (MICROZIDE) 12.5 MG capsule TAKE 1 CAPSULE (12.5 MG TOTAL) BY MOUTH DAILY. 90 capsule 1  . potassium chloride SA (K-DUR,KLOR-CON) 20 MEQ tablet Take 1 tablet (20 mEq total) by mouth daily. 30 tablet 11   No facility-administered medications prior to visit.     Review of Systems  Constitutional: Negative for chills and fever.  HENT: Negative for congestion, sinus pain, sinus pressure and sore throat.   Respiratory: Positive for shortness of breath and wheezing. Negative for cough.    Cardiovascular: Negative for chest pain, palpitations and leg swelling.  Neurological: Negative for headaches.      Objective:    BP 138/90 (BP Location: Left Arm, Patient Position: Sitting, Cuff Size: Large)   Pulse 81   Temp 98 F (36.7 C) (Oral)   Resp 20   Ht 5\' 7"  (1.702 m)   Wt 145 lb (65.8 kg)   LMP  (LMP Unknown)   SpO2 95%   BMI 22.71 kg/m  Nursing note and vital signs reviewed.  Physical Exam  Constitutional: She is oriented to person, place, and time. She appears well-developed and well-nourished. No distress.  HENT:  Right Ear: Hearing, tympanic membrane, external ear and ear canal normal.  Left Ear: Hearing, tympanic membrane, external ear and ear canal normal.  Nose: Nose normal.  Mouth/Throat: Uvula is midline, oropharynx is clear and moist and mucous membranes are normal.  Cardiovascular: Normal rate, regular rhythm, normal heart sounds and intact distal pulses.   Pulmonary/Chest: Effort normal. No accessory muscle usage. Tachypnea noted. No respiratory distress. She has decreased breath sounds. She has wheezes. She has no rhonchi. She has no rales.  Neurological: She is alert and oriented to person, place, and time.  Skin: Skin is warm and dry.  Psychiatric: She has a normal mood and affect. Her behavior is normal. Judgment and thought content normal.       Assessment & Plan:   Problem List Items Addressed This Visit      Respiratory   COPD (chronic obstructive  pulmonary disease) ([place]) - Primary    Symptoms consistent with uncontrolled COPD with current regimen with no sign of infection or need of antibiotics. Start prednisone. Hold Symbicort. Trial of Trelegy provided. Continue current dosage of albuterol as needed. Recommend follow up with pulmonology for additional treatment given severe COPD.       Relevant Medications   predniSONE (DELTASONE) 20 MG tablet   Fluticasone-Umeclidin-Vilant (TRELEGY ELLIPTA) 100-62.5-25 MCG/INH AEPB       I am  having Ms. Chovanec start on predniSONE and Fluticasone-Umeclidin-Vilant. I am also having her maintain her fluorouracil, albuterol, diltiazem, budesonide-formoterol, potassium chloride SA, and hydrochlorothiazide.   Meds ordered this encounter  Medications  . predniSONE (DELTASONE) 20 MG tablet    Sig: Take 1 tablet (20 mg total) by mouth 2 (two) times daily with a meal.    Dispense:  10 tablet    Refill:  0    Order Specific Question:   Supervising Provider    Answer:   Pricilla Holm A [[id number]]  . Fluticasone-Umeclidin-Vilant (TRELEGY ELLIPTA) 100-62.5-25 MCG/INH AEPB    Sig: Inhale 1 puff into the lungs daily.    Dispense:  28 each    Refill:  0    Order Specific Question:   Supervising Provider    Answer:   Pricilla Holm A [[id number]]     Follow-up: Return if symptoms worsen or fail to improve.  Mauricio Po, FNP

## 2016-08-30 NOTE — Patient Instructions (Addendum)
Thank you for choosing [facility].  SUMMARY AND INSTRUCTIONS:  Please follow up with pulmonology for further assessment and treatment.  Start the Trelegy daily. Hold Symbicort. Continue with albuterol as needed.   Start the prednisone.  Medication:  Your prescription(s) have been submitted to your pharmacy or been printed and provided for you. Please take as directed and contact our office if you believe you are having problem(s) with the medication(s) or have any questions.  Follow up:  If your symptoms worsen or fail to improve, please contact our office for further instruction, or in case of emergency go directly to the emergency room at the closest medical facility.

## 2016-08-30 NOTE — Assessment & Plan Note (Signed)
Symptoms consistent with uncontrolled COPD with current regimen with no sign of infection or need of antibiotics. Start prednisone. Hold Symbicort. Trial of Trelegy provided. Continue current dosage of albuterol as needed. Recommend follow up with pulmonology for additional treatment given severe COPD.

## 2016-08-30 NOTE — Telephone Encounter (Signed)
Attempted to contact pt. No answer, no option to leave a message. Will try back.  

## 2016-08-31 NOTE — Telephone Encounter (Signed)
Called and spoke to pt. Pt is requesting to change providers from Dr. Elsworth Soho to Dr. Lamonte Sakai, pt requesting this because she would like to see a different doctor than the one her husband sees.  Pt has been tentatively  scheduled with Dr. Lamonte Sakai in May 2018.   Dr. Elsworth Soho please advise if ok to switch MD's. Thanks.   Dr. Lamonte Sakai please advise if ok to take on new pt. Thanks.

## 2016-09-01 NOTE — Telephone Encounter (Signed)
OK with me also.  ?

## 2016-09-01 NOTE — Telephone Encounter (Signed)
Spoke with the pt and notified both docs approved the switch  She is aware of appt date and time with RB  Nothing further needed

## 2016-09-01 NOTE — Telephone Encounter (Signed)
OK with me.

## 2016-09-08 ENCOUNTER — Telehealth: Payer: Self-pay | Admitting: *Deleted

## 2016-09-08 MED ORDER — LORAZEPAM 0.5 MG PO TABS: 0.5000 mg | ORAL_TABLET | Freq: Two times a day (BID) | ORAL | 1 refills | Status: DC | PRN

## 2016-09-08 NOTE — Telephone Encounter (Signed)
Called pt no answer LMOM MD has rx Lorazepam rx has been sent to [facility].../lmb

## 2016-09-08 NOTE — Telephone Encounter (Signed)
Pt left msg on triage stating fought out yesterday that her twin sister only have about 4 to 5 weeks to live. She is having a very hard time with this, and wanting to see can Marya Amsler rx something for her nerves. Marya Amsler is out of the office for 1 week pls advise...Johny Chess

## 2016-09-08 NOTE — Telephone Encounter (Signed)
faxed

## 2016-09-08 NOTE — Telephone Encounter (Signed)
Done hardcopy to Shirron  

## 2016-09-15 ENCOUNTER — Other Ambulatory Visit: Payer: Self-pay | Admitting: Family

## 2016-09-21 ENCOUNTER — Other Ambulatory Visit: Payer: Self-pay | Admitting: Family

## 2016-10-14 ENCOUNTER — Ambulatory Visit (INDEPENDENT_AMBULATORY_CARE_PROVIDER_SITE_OTHER): Payer: Medicare Other | Admitting: Emergency Medicine

## 2016-10-14 ENCOUNTER — Encounter: Payer: Self-pay | Admitting: Emergency Medicine

## 2016-10-14 DIAGNOSIS — J449 Chronic obstructive pulmonary disease, unspecified: Secondary | ICD-10-CM

## 2016-10-14 DIAGNOSIS — J309 Allergic rhinitis, unspecified: Secondary | ICD-10-CM | POA: Insufficient documentation

## 2016-10-14 DIAGNOSIS — J301 Allergic rhinitis due to pollen: Secondary | ICD-10-CM | POA: Diagnosis not present

## 2016-10-14 MED ORDER — AZELASTINE-FLUTICASONE 137-50 MCG/ACT NA SUSP: 2.0000 | Freq: Once | NASAL | 3 refills | Status: DC

## 2016-10-14 NOTE — Patient Instructions (Signed)
We will temporarily stop Symbicort We will start Trelegy one puff once a day for the next month to see if you benefit from it. Walking oximetry today on room air Please use Dymista 2 sprays each nostril once a day as needed for nasal congestion Follow with APP in 1 month to discuss your breathing on the Trelegy, and to decide whether we will continue it.  Follow with Dr Lamonte Sakai in 3 months or sooner if you have any problems.

## 2016-10-14 NOTE — Progress Notes (Signed)
Subjective:    Patient ID: Diane Patterson, female    DOB: 1945/08/17, 71 y.o.   MRN: [id number]  HPI 71 year old former smoker, formerly followed by Dr Elsworth Soho for fixed asthma / COPD. I have personally reviewed her spirometry and pulmonary function testing from 2009 and then 2016, shows very severe obstruction with an FEV1 of approximately 0.73 L. A review of her chart reveals that she has frequent exacerbations. He was last seen here 01/19/16. Her bronchodilator regimen is . She has been tried on Incruse and Trelegy before based on the notes but she never took these. She has stayed on the Symbicort. She has exertional SOB. She does not cough. She has wheezing frequently. She feels nasal gtt, throat irritation. She was just treated w pred about 1 month ago. She takes albuterol about once every several months. She is reliable with the flu shot. Prevnar in 2015.    Review of Systems  Past Medical History:  Diagnosis Date  . Asthma   . Chronic airflow obstruction (HCC)   . Hypertension   . Tobacco abuse   . Venous insufficiency      Family History  Problem Relation Age of Onset  . Diabetes Mother   . Emphysema Father   . Asthma Father   . Heart disease Father      Social History   Social History  . Marital status: Married    Spouse name: N/A  . Number of children: 1  . Years of education: N/A   Occupational History  . Not on file.   Social History Main Topics  . Smoking status: Former Smoker    Packs/day: 0.75    Years: 15.00    Types: Cigarettes    Quit date: 03/20/2014  . Smokeless tobacco: Former Systems developer    Quit date: 03/11/2014  . Alcohol use No  . Drug use: No  . Sexual activity: Not on file   Other Topics Concern  . Not on file   Social History Narrative  . No narrative on file    No Known Allergies   Outpatient Medications Prior to Visit  Medication Sig Dispense Refill  . albuterol (PROVENTIL HFA;VENTOLIN HFA) 108 (90 Base) MCG/ACT inhaler Inhale 2  puffs into the lungs every 4 (four) hours as needed for wheezing or shortness of breath. 1 Inhaler 3  . budesonide-formoterol (SYMBICORT) 160-4.5 MCG/ACT inhaler Inhale 2 puffs into the lungs 2 (two) times daily. 10.2 g 11  . diltiazem (CARDIZEM SR) 90 MG 12 hr capsule TAKE 1 CAPSULE BY MOUTH DAILY 90 capsule 3  . hydrochlorothiazide (MICROZIDE) 12.5 MG capsule TAKE 1 CAPSULE (12.5 MG TOTAL) BY MOUTH DAILY. 90 capsule 1  . LORazepam (ATIVAN) 0.5 MG tablet Take 1 tablet (0.5 mg total) by mouth 2 (two) times daily as needed for anxiety. 30 tablet 1  . potassium chloride SA (K-DUR,KLOR-CON) 20 MEQ tablet Take 1 tablet (20 mEq total) by mouth daily. 30 tablet 11  . fluorouracil (EFUDEX) 5 % cream Apply 1 application topically daily.    . Fluticasone-Umeclidin-Vilant (TRELEGY ELLIPTA) 100-62.5-25 MCG/INH AEPB Inhale 1 puff into the lungs daily. 28 each 0  . predniSONE (DELTASONE) 20 MG tablet Take 1 tablet (20 mg total) by mouth 2 (two) times daily with a meal. 10 tablet 0   No facility-administered medications prior to visit.         Objective:   Physical Exam Vitals:   10/14/16 1456  BP: 130/82  Pulse: 88  SpO2: 93%  Weight: 141 lb (64 kg)  Height: 5\' 7"  (1.702 m)   Gen: Pleasant, Elderly woman, well-nourished, in no distress,  normal affect  ENT: No lesions,  mouth clear,  oropharynx clear, no postnasal drip  Neck: No JVD, no TMG, no carotid bruits  Lungs: No use of accessory muscles, quite distant, no wheeze, clear without rales or rhonchi  Cardiovascular: RRR, heart sounds normal, no murmur or gallops, no peripheral edema  Musculoskeletal: No deformities, no cyanosis or clubbing  Neuro: alert, non focal  Skin: Warm, no lesions or rashes     Assessment & Plan:  COPD (chronic obstructive pulmonary disease) We will temporarily stop Symbicort We will start Trelegy one puff once a day for the next month to see if you benefit from it. Walking oximetry today on room air Follow  with APP in 1 month to discuss your breathing on the Trelegy, and to decide whether we will continue it.  Follow with Dr Lamonte Sakai in 3 months or sooner if you have any problems.  Allergic rhinitis Her husband has benefited from [place]. She would like to try this. We will write her a prescription.  Baltazar Apo, MD, PhD 10/14/2016, 3:32 PM [facility] [phone] or if no answer [phone]

## 2016-10-14 NOTE — Assessment & Plan Note (Signed)
Her husband has benefited from [facility]. She would like to try this. We will write her a prescription.

## 2016-10-14 NOTE — Assessment & Plan Note (Signed)
We will temporarily stop Symbicort We will start Trelegy one puff once a day for the next month to see if you benefit from it. Walking oximetry today on room air Follow with APP in 1 month to discuss your breathing on the Trelegy, and to decide whether we will continue it.  Follow with Dr Lamonte Sakai in 3 months or sooner if you have any problems.

## 2016-11-01 ENCOUNTER — Telehealth: Payer: Self-pay | Admitting: Emergency Medicine

## 2016-11-01 MED ORDER — PREDNISONE 10 MG PO TABS: ORAL_TABLET | ORAL | 0 refills | Status: DC

## 2016-11-01 NOTE — Telephone Encounter (Signed)
Pt aware that Rx sent. Nothing further needed.

## 2016-11-01 NOTE — Telephone Encounter (Signed)
Pred taper starting at 40 mg. Reduce dose by 10 mg every 2 days.

## 2016-11-01 NOTE — Telephone Encounter (Signed)
PM  Please Advise in RB's absence-  Pt called in today requesting prednisone. Pt is c/o lots of coughing with thick gray mucus,chest congestion,sob,chest tightness,fatiugue, and slight wheezing, Denies fever.   No Known Allergies Current Outpatient Prescriptions on File Prior to Visit  Medication Sig Dispense Refill  . albuterol (PROVENTIL HFA;VENTOLIN HFA) 108 (90 Base) MCG/ACT inhaler Inhale 2 puffs into the lungs every 4 (four) hours as needed for wheezing or shortness of breath. 1 Inhaler 3  . Azelastine-Fluticasone 137-50 MCG/ACT SUSP Place 2 sprays into the nose once. 1 Bottle 3  . budesonide-formoterol (SYMBICORT) 160-4.5 MCG/ACT inhaler Inhale 2 puffs into the lungs 2 (two) times daily. 10.2 g 11  . diltiazem (CARDIZEM SR) 90 MG 12 hr capsule TAKE 1 CAPSULE BY MOUTH DAILY 90 capsule 3  . Fluticasone-Umeclidin-Vilant (TRELEGY ELLIPTA) 100-62.5-25 MCG/INH AEPB Inhale 1 puff into the lungs.    . hydrochlorothiazide (MICROZIDE) 12.5 MG capsule TAKE 1 CAPSULE (12.5 MG TOTAL) BY MOUTH DAILY. 90 capsule 1  . LORazepam (ATIVAN) 0.5 MG tablet Take 1 tablet (0.5 mg total) by mouth 2 (two) times daily as needed for anxiety. 30 tablet 1  . potassium chloride SA (K-DUR,KLOR-CON) 20 MEQ tablet Take 1 tablet (20 mEq total) by mouth daily. 30 tablet 11   No current facility-administered medications on file prior to visit.

## 2016-11-15 ENCOUNTER — Encounter: Payer: Self-pay | Admitting: Adult Health

## 2016-11-15 ENCOUNTER — Encounter: Payer: Medicare Other | Admitting: Adult Health

## 2016-11-15 ENCOUNTER — Ambulatory Visit (INDEPENDENT_AMBULATORY_CARE_PROVIDER_SITE_OTHER): Payer: Medicare Other | Admitting: Adult Health

## 2016-11-15 VITALS — BP 114/66 | HR 63 | Ht 67.0 in | Wt 140.0 lb

## 2016-11-15 DIAGNOSIS — J9611 Chronic respiratory failure with hypoxia: Secondary | ICD-10-CM

## 2016-11-15 DIAGNOSIS — J449 Chronic obstructive pulmonary disease, unspecified: Secondary | ICD-10-CM | POA: Diagnosis not present

## 2016-11-15 DIAGNOSIS — J441 Chronic obstructive pulmonary disease with (acute) exacerbation: Secondary | ICD-10-CM | POA: Diagnosis not present

## 2016-11-15 MED ORDER — BUDESONIDE-FORMOTEROL FUMARATE 160-4.5 MCG/ACT IN AERO: 2.0000 | INHALATION_SPRAY | Freq: Two times a day (BID) | RESPIRATORY_TRACT | 0 refills | Status: DC

## 2016-11-15 MED ORDER — BUDESONIDE-FORMOTEROL FUMARATE 160-4.5 MCG/ACT IN AERO: 2.0000 | INHALATION_SPRAY | Freq: Two times a day (BID) | RESPIRATORY_TRACT | 5 refills | Status: DC

## 2016-11-15 NOTE — Patient Instructions (Addendum)
Continue on Symbicort 2 puffs Twice daily  , rinse after use.  Use Albuterol inhaler or neb As needed  .  Mucinex DM Twice daily  As needed cough /congestion .  Order for POC  Continue on Oxygen 2l/m with activity and At bedtime  .  Follow up Dr. Lamonte Sakai  In 3 months and As needed

## 2016-11-15 NOTE — Addendum Note (Signed)
Addended by: Jamarian Jacinto E on: 11/15/2016 02:06 PM   Modules accepted: Orders  

## 2016-11-15 NOTE — Assessment & Plan Note (Signed)
No perceived benefit from [place] .  Continue on Symbicort .   Plan  Patient Instructions  Continue on Symbicort 2 puffs Twice daily  , rinse after use.  Use Albuterol inhaler or neb As needed  .  Mucinex DM Twice daily  As needed cough /congestion .  Order for POC  Continue on Oxygen 2l/m with activity and At bedtime  .  Follow up Dr. Lamonte Sakai  In 3 months and As needed

## 2016-11-15 NOTE — Assessment & Plan Note (Signed)
Continue on O2  Order for lightweight POC to DME .

## 2016-11-15 NOTE — Progress Notes (Signed)
@Patient  ID: Zane Herald, female    DOB: 10-11-1945, 71 y.o.   MRN: [id number]  Chief Complaint  Patient presents with  . Follow-up    COPD     Referring provider: Golden Circle, FNP  HPI: 71 year old female former smoker followed for chronic obstructive asthma and GOLD IV COPD  TEST  PFT 2009, shows very severe obstruction with an FEV1 of approximately 0.73 L, 27%.  2016 FEV1 32%.   11/15/2016 Follow up : COPD/Asthma  Patient returns for a one-month follow-up. Patient was seen last visit and change from Symbicort to [facility] . She says that she was unable to tolerate this. She prefers to take Symbicort. She feels that works the best. Patient was started on oxygen with activity and bedtime, last visit. Patient says it does help that she needs a light weight portable concentrator.  She denies any flare cough or wheezing. She does get short of breath with activity. She has to stop and rest often.  She has had a stressful month. Her twin sister passed away from bladder cancer.     No Known Allergies  Immunization History  Administered Date(s) Administered  . Influenza Split 03/15/2011, 03/06/2012  . Influenza Whole 03/12/2009, 02/10/2010  . Influenza, High Dose Seasonal PF 04/01/2015, 03/05/2016  . Influenza,inj,Quad PF,36+ Mos 03/30/2013, 03/11/2014  . Pneumococcal Conjugate-13 11/14/2013  . Pneumococcal Polysaccharide-23 09/11/2010    Past Medical History:  Diagnosis Date  . Asthma   . Chronic airflow obstruction (HCC)   . Hypertension   . Tobacco abuse   . Venous insufficiency     Tobacco History: History  Smoking Status  . Former Smoker  . Packs/day: 0.75  . Years: 15.00  . Types: Cigarettes  . Quit date: 03/20/2014  Smokeless Tobacco  . Former Systems developer  . Quit date: 03/11/2014   Counseling given: Not Answered   Outpatient Encounter Prescriptions as of 11/15/2016  Medication Sig  . albuterol (PROVENTIL HFA;VENTOLIN HFA) 108 (90 Base) MCG/ACT  inhaler Inhale 2 puffs into the lungs every 4 (four) hours as needed for wheezing or shortness of breath.  . Azelastine-Fluticasone 137-50 MCG/ACT SUSP Place 2 sprays into the nose once.  . budesonide-formoterol (SYMBICORT) 160-4.5 MCG/ACT inhaler Inhale 2 puffs into the lungs 2 (two) times daily.  Marland Kitchen diltiazem (CARDIZEM SR) 90 MG 12 hr capsule TAKE 1 CAPSULE BY MOUTH DAILY  . hydrochlorothiazide (MICROZIDE) 12.5 MG capsule TAKE 1 CAPSULE (12.5 MG TOTAL) BY MOUTH DAILY.  Marland Kitchen potassium chloride SA (K-DUR,KLOR-CON) 20 MEQ tablet Take 1 tablet (20 mEq total) by mouth daily.  . budesonide-formoterol (SYMBICORT) 160-4.5 MCG/ACT inhaler Inhale 2 puffs into the lungs 2 (two) times daily.  Marland Kitchen LORazepam (ATIVAN) 0.5 MG tablet Take 1 tablet (0.5 mg total) by mouth 2 (two) times daily as needed for anxiety. (Patient not taking: Reported on 11/15/2016)  . [DISCONTINUED] Fluticasone-Umeclidin-Vilant (TRELEGY ELLIPTA) 100-62.5-25 MCG/INH AEPB Inhale 1 puff into the lungs.  . [DISCONTINUED] predniSONE (DELTASONE) 10 MG tablet Take 40mg  x 2 days, then 30mg  x 2 days, then 20mg  x 2 days, then 10mg  x 2 days, then stop. Thanks. (Patient not taking: Reported on 11/15/2016)   No facility-administered encounter medications on file as of 11/15/2016.      Review of Systems  Constitutional:   No  weight loss, night sweats,  Fevers, chills,  +fatigue, or  lassitude.  HEENT:   No headaches,  Difficulty swallowing,  Tooth/dental problems, or  Sore throat,  No sneezing, itching, ear ache, nasal congestion, post nasal drip,   CV:  No chest pain,  Orthopnea, PND, swelling in lower extremities, anasarca, dizziness, palpitations, syncope.   GI  No heartburn, indigestion, abdominal pain, nausea, vomiting, diarrhea, change in bowel habits, loss of appetite, bloody stools.   Resp:    No chest wall deformity  Skin: no rash or lesions.  GU: no dysuria, change in color of urine, no urgency or frequency.  No flank pain,  no hematuria   MS:  No joint pain or swelling.  No decreased range of motion.  No back pain.    Physical Exam  BP 114/66 (BP Location: Left Arm, Cuff Size: Normal)   Pulse 63   Ht 5\' 7"  (1.702 m)   Wt 140 lb (63.5 kg)   LMP  (LMP Unknown)   SpO2 95%   BMI 21.93 kg/m   GEN: A/Ox3; pleasant , NAD, thin    HEENT:  [place]/AT,  EACs-clear, TMs-wnl, NOSE-clear, THROAT-clear, no lesions, no postnasal drip or exudate noted.   NECK:  Supple w/ fair ROM; no JVD; normal carotid impulses w/o bruits; no thyromegaly or nodules palpated; no lymphadenopathy.    RESP  Decreased BS in bases , No accessory muscle use, no dullness to percussion  CARD:  RRR, no m/r/g, no peripheral edema, pulses intact, no cyanosis or clubbing.  GI:   Soft & nt; nml bowel sounds; no organomegaly or masses detected.   Musco: Warm bil, no deformities or joint swelling noted.   Neuro: alert, no focal deficits noted.    Skin: Warm, no lesions or rashes    Lab Results:  CBC  BMET  BNP No results found for: BNP  ProBNP Imaging: No results found.   Assessment & Plan:   COPD (chronic obstructive pulmonary disease) No perceived benefit from [place] .  Continue on Symbicort .   Plan  Patient Instructions  Continue on Symbicort 2 puffs Twice daily  , rinse after use.  Use Albuterol inhaler or neb As needed  .  Mucinex DM Twice daily  As needed cough /congestion .  Order for POC  Continue on Oxygen 2l/m with activity and At bedtime  .  Follow up Dr. Lamonte Sakai  In 3 months and As needed       Chronic respiratory failure with hypoxia ([place]) Continue on O2  Order for lightweight POC to DME .      Rexene Edison, NP 11/15/2016

## 2016-12-16 ENCOUNTER — Ambulatory Visit: Payer: Medicare Other | Admitting: Family

## 2016-12-17 ENCOUNTER — Ambulatory Visit (INDEPENDENT_AMBULATORY_CARE_PROVIDER_SITE_OTHER): Payer: Medicare Other | Admitting: Family

## 2016-12-17 ENCOUNTER — Encounter: Payer: Self-pay | Admitting: Family

## 2016-12-17 DIAGNOSIS — J441 Chronic obstructive pulmonary disease with (acute) exacerbation: Secondary | ICD-10-CM

## 2016-12-17 MED ORDER — ALBUTEROL SULFATE HFA 108 (90 BASE) MCG/ACT IN AERS: 2.0000 | INHALATION_SPRAY | RESPIRATORY_TRACT | 5 refills | Status: DC | PRN

## 2016-12-17 MED ORDER — BUDESONIDE-FORMOTEROL FUMARATE 160-4.5 MCG/ACT IN AERO: 2.0000 | INHALATION_SPRAY | Freq: Two times a day (BID) | RESPIRATORY_TRACT | 5 refills | Status: DC

## 2016-12-17 MED ORDER — PREDNISONE 20 MG PO TABS: 20.0000 mg | ORAL_TABLET | Freq: Two times a day (BID) | ORAL | 0 refills | Status: DC

## 2016-12-17 NOTE — Patient Instructions (Addendum)
Thank you for choosing [facility].  SUMMARY AND INSTRUCTIONS:  Please start the prednisone as needed.  Continue with the Symbicort and albuterol.  We will check on your jury duty status.   Medication:  Your prescription(s) have been submitted to your pharmacy or been printed and provided for you. Please take as directed and contact our office if you believe you are having problem(s) with the medication(s) or have any questions.  Follow up:  If your symptoms worsen or fail to improve, please contact our office for further instruction, or in case of emergency go directly to the emergency room at the closest medical facility.

## 2016-12-17 NOTE — Assessment & Plan Note (Signed)
Symptoms and exam are consistent with COPD exacerbation considering increased shortness of breath. Does not appear to be infectious at this time. Continue current dosage of Symbicort and albuterol.Short course of prednisone as needed. Recommend follow up with pulmonology. Patient also requesting release from [facility] with form provided for evaluation.

## 2016-12-17 NOTE — Progress Notes (Signed)
Subjective:    Patient ID: Diane Patterson, female    DOB: 07-02-1945, 70 y.o.   MRN: [id number]  Chief Complaint  Patient presents with  . COPD    new rx for symbicort would like year supply also albuterol, was recently put on O2, has been having SOB    HPI:  Diane Patterson is a 71 y.o. female who  has a past medical history of Asthma; Chronic airflow obstruction ([place]); Hypertension; Tobacco abuse; and Venous insufficiency. and presents today for an acute office visit.  This is a new problem. Associated symptom of congestion and cough have been going on for about 1 week with occasional shortness of breath. Severity at times can limit abilities to complete activities of daily living. No fevers. Modifying factors include Symbicort and albuterol which have helped a little. Denies fevers.   No Known Allergies    Outpatient Medications Prior to Visit  Medication Sig Dispense Refill  . Azelastine-Fluticasone 137-50 MCG/ACT SUSP Place 2 sprays into the nose once. 1 Bottle 3  . diltiazem (CARDIZEM SR) 90 MG 12 hr capsule TAKE 1 CAPSULE BY MOUTH DAILY 90 capsule 3  . hydrochlorothiazide (MICROZIDE) 12.5 MG capsule TAKE 1 CAPSULE (12.5 MG TOTAL) BY MOUTH DAILY. 90 capsule 1  . potassium chloride SA (K-DUR,KLOR-CON) 20 MEQ tablet Take 1 tablet (20 mEq total) by mouth daily. 30 tablet 11  . albuterol (PROVENTIL HFA;VENTOLIN HFA) 108 (90 Base) MCG/ACT inhaler Inhale 2 puffs into the lungs every 4 (four) hours as needed for wheezing or shortness of breath. 1 Inhaler 3  . budesonide-formoterol (SYMBICORT) 160-4.5 MCG/ACT inhaler Inhale 2 puffs into the lungs 2 (two) times daily. 10.2 g 5  . budesonide-formoterol (SYMBICORT) 160-4.5 MCG/ACT inhaler Inhale 2 puffs into the lungs 2 (two) times daily. 1 Inhaler 0  . LORazepam (ATIVAN) 0.5 MG tablet Take 1 tablet (0.5 mg total) by mouth 2 (two) times daily as needed for anxiety. (Patient not taking: Reported on 11/15/2016) 30 tablet 1   No  facility-administered medications prior to visit.      Review of Systems  Constitutional: Negative for chills and fever.  HENT: Positive for congestion. Negative for sinus pain, sinus pressure and sore throat.   Respiratory: Positive for cough, shortness of breath and wheezing.   Cardiovascular: Negative for chest pain, palpitations and leg swelling.      Objective:    BP (!) 140/92 (BP Location: Left Arm, Patient Position: Sitting, Cuff Size: Normal)   Pulse 62   Temp 98.2 F (36.8 C) (Oral)   Resp 18   Ht 5\' 7"  (1.702 m)   Wt 143 lb (64.9 kg)   LMP  (LMP Unknown)   SpO2 95%   BMI 22.40 kg/m  Nursing note and vital signs reviewed.  Physical Exam  Constitutional: She is oriented to person, place, and time. She appears well-developed and well-nourished. No distress.  Cardiovascular: Normal rate, regular rhythm, normal heart sounds and intact distal pulses.  Exam reveals no gallop and no friction rub.   No murmur heard. Pulmonary/Chest: No respiratory distress. She has wheezes. She has no rales. She exhibits no tenderness.  Neurological: She is alert and oriented to person, place, and time.  Skin: Skin is warm and dry.  Psychiatric: She has a normal mood and affect. Her behavior is normal. Judgment and thought content normal.       Assessment & Plan:   Problem List Items Addressed This Visit      Respiratory  COPD exacerbation (HCC)    Symptoms and exam are consistent with COPD exacerbation considering increased shortness of breath. Does not appear to be infectious at this time. Continue current dosage of Symbicort and albuterol.Short course of prednisone as needed. Recommend follow up with pulmonology. Patient also requesting release from [facility] with form provided for evaluation.       Relevant Medications   budesonide-formoterol (SYMBICORT) 160-4.5 MCG/ACT inhaler   albuterol (PROVENTIL HFA;VENTOLIN HFA) 108 (90 Base) MCG/ACT inhaler   predniSONE (DELTASONE) 20 MG  tablet       I have discontinued Ms. Penn's LORazepam. I am also having her start on predniSONE. Additionally, I am having her maintain her potassium chloride SA, hydrochlorothiazide, diltiazem, Azelastine-Fluticasone, budesonide-formoterol, and albuterol.   Meds ordered this encounter  Medications  . budesonide-formoterol (SYMBICORT) 160-4.5 MCG/ACT inhaler    Sig: Inhale 2 puffs into the lungs 2 (two) times daily.    Dispense:  10.2 g    Refill:  5    Order Specific Question:   Supervising Provider    Answer:   Pricilla Holm A [[id number]]  . albuterol (PROVENTIL HFA;VENTOLIN HFA) 108 (90 Base) MCG/ACT inhaler    Sig: Inhale 2 puffs into the lungs every 4 (four) hours as needed for wheezing or shortness of breath.    Dispense:  1 Inhaler    Refill:  5    Order Specific Question:   Supervising Provider    Answer:   Pricilla Holm A [[id number]]  . predniSONE (DELTASONE) 20 MG tablet    Sig: Take 1 tablet (20 mg total) by mouth 2 (two) times daily with a meal.    Dispense:  8 tablet    Refill:  0    Order Specific Question:   Supervising Provider    Answer:   Pricilla Holm A [[id number]]     Follow-up: Return if symptoms worsen or fail to improve.  Mauricio Po, FNP

## 2016-12-23 ENCOUNTER — Telehealth: Payer: Self-pay | Admitting: Family

## 2016-12-23 NOTE — Telephone Encounter (Signed)
Pt would like some one to called her about the jury summons , she would like to know when it is complete

## 2016-12-23 NOTE — Telephone Encounter (Signed)
Please inform patient to inquire about the handicap parking that is available [place] to see if that is a feasible distance.

## 2016-12-24 NOTE — Telephone Encounter (Signed)
Noted  

## 2016-12-24 NOTE — Telephone Encounter (Signed)
Pt will call the courthouse and will call back

## 2016-12-31 ENCOUNTER — Telehealth: Payer: Self-pay | Admitting: Family

## 2016-12-31 NOTE — Telephone Encounter (Signed)
Patient requesting refill on symbicort w/ 11 months refills [facility] [address].  Patient states she only has 4 more puffs left on med.

## 2016-12-31 NOTE — Telephone Encounter (Signed)
Patient states she will check back with pharmacy.

## 2016-12-31 NOTE — Telephone Encounter (Signed)
Per [facility] pt need to contact her pharmacy for renewal.../lmb

## 2017-01-20 ENCOUNTER — Telehealth: Payer: Self-pay | Admitting: Emergency Medicine

## 2017-01-20 MED ORDER — PREDNISONE 20 MG PO TABS: 20.0000 mg | ORAL_TABLET | Freq: Every day | ORAL | 0 refills | Status: DC

## 2017-01-20 MED ORDER — DOXYCYCLINE HYCLATE 100 MG PO TABS: ORAL_TABLET | ORAL | 0 refills | Status: DC

## 2017-01-20 NOTE — Telephone Encounter (Signed)
Spoke with pt and made of aware of CY's message. Pt agreed to the medications being called in. Rxs was sent to her pharmacy. She had no additional questions at this time. Nothing further is needed

## 2017-01-20 NOTE — Telephone Encounter (Signed)
Offer doxycycline 100 mg, # 8, 2 today then one daily  It may also help to take prednisone 20 mg, # 4, 1 daily

## 2017-01-20 NOTE — Telephone Encounter (Signed)
Pt c/o chest congestion, chest discomfort, hoarseness, coughing, wheezing and SOB.  Pt notes some mucus production that is thick and grey.  Using Mucinex DM but does not seem to get any relief from it.  [facility] [address]  Pt requesting an abx.   Please advise Dr Annamaria Boots of recommendations as Dr Lamonte Sakai is not available. Thanks.   Allergies as of 01/20/2017   No Known Allergies     Medication List       Accurate as of 01/20/17  4:02 PM. Always use your most recent med list.          albuterol 108 (90 Base) MCG/ACT inhaler Commonly known as:  PROVENTIL HFA;VENTOLIN HFA Inhale 2 puffs into the lungs every 4 (four) hours as needed for wheezing or shortness of breath.   Azelastine-Fluticasone 137-50 MCG/ACT Susp Place 2 sprays into the nose once.   budesonide-formoterol 160-4.5 MCG/ACT inhaler Commonly known as:  SYMBICORT Inhale 2 puffs into the lungs 2 (two) times daily.   diltiazem 90 MG 12 hr capsule Commonly known as:  CARDIZEM SR TAKE 1 CAPSULE BY MOUTH DAILY   hydrochlorothiazide 12.5 MG capsule Commonly known as:  MICROZIDE TAKE 1 CAPSULE (12.5 MG TOTAL) BY MOUTH DAILY.   potassium chloride SA 20 MEQ tablet Commonly known as:  K-DUR,KLOR-CON Take 1 tablet (20 mEq total) by mouth daily.   predniSONE 20 MG tablet Commonly known as:  DELTASONE Take 1 tablet (20 mg total) by mouth 2 (two) times daily with a meal.

## 2017-01-26 ENCOUNTER — Telehealth: Payer: Self-pay | Admitting: Adult Health

## 2017-01-26 NOTE — Telephone Encounter (Signed)
Pulmonary wrote patient letter to be excused from [employer].

## 2017-01-26 NOTE — Telephone Encounter (Signed)
Diane Patterson, did this ever get done?

## 2017-01-26 NOTE — Telephone Encounter (Signed)
Pt and spouse came to the office with jury summons letter asking if pt can be excused d/t her COPD and O2 use Okay per TP d/t oxygen dependent severe COPD w/ FEV1 at 32%  Letter generated, signed by TP and shown to/discussed with patient Copy of summons given to patient so that she can check the night before to see if the excusal was granted Copy of summons made to be scanned into patient's chart Terri in the mail room was on the floor so the envelope was handed to Ms. Terri for outgoing mail  Nothing further needed; will sign off

## 2017-02-08 ENCOUNTER — Encounter: Payer: Self-pay | Admitting: Emergency Medicine

## 2017-02-08 ENCOUNTER — Ambulatory Visit (INDEPENDENT_AMBULATORY_CARE_PROVIDER_SITE_OTHER): Payer: Medicare Other | Admitting: Emergency Medicine

## 2017-02-08 VITALS — BP 118/78 | HR 62 | Ht 67.0 in | Wt 142.6 lb

## 2017-02-08 DIAGNOSIS — J9611 Chronic respiratory failure with hypoxia: Secondary | ICD-10-CM | POA: Diagnosis not present

## 2017-02-08 MED ORDER — AZITHROMYCIN 250 MG PO TABS: ORAL_TABLET | ORAL | 0 refills | Status: AC

## 2017-02-08 MED ORDER — PREDNISONE 10 MG PO TABS: ORAL_TABLET | ORAL | 0 refills | Status: DC

## 2017-02-08 MED ORDER — TIOTROPIUM BROMIDE MONOHYDRATE 2.5 MCG/ACT IN AERS: 2.0000 | INHALATION_SPRAY | Freq: Every day | RESPIRATORY_TRACT | 5 refills | Status: DC

## 2017-02-08 NOTE — Assessment & Plan Note (Signed)
Continue Symbicort  Start Spiriva respimat 2 puffs once a day.  We will work on getting you a lighter, more portable O2 system. Hopefully a portable concentrator.  Start mucinex 600mg  twice a day.  Get the flu shot next visit Follow with Diane Patterson next available to review your status.

## 2017-02-08 NOTE — Assessment & Plan Note (Signed)
Take prednisone taper as directed  Take azithromycin until completely gone

## 2017-02-08 NOTE — Progress Notes (Signed)
   Subjective:    Patient ID: Diane Patterson, female    DOB: 09/17/1945, 71 y.o.   MRN: [id number]  HPI 71 year old former smoker, formerly followed by Dr Elsworth Soho for fixed asthma / COPD. I have personally reviewed her spirometry and pulmonary function testing from 2009 and then 2016, shows very severe obstruction with an FEV1 of approximately 0.73 L. A review of her chart reveals that she has frequent exacerbations. He was last seen here 01/19/16. Her bronchodilator regimen is . She has been tried on Incruse and Trelegy before based on the notes but she never took these. She has stayed on the Symbicort. She has exertional SOB. She does not cough. She has wheezing frequently. She feels nasal gtt, throat irritation. She was just treated w pred about 1 month ago. She takes albuterol about once every several months. She is reliable with the flu shot. Prevnar in 2015.   ROV 02/08/17 -- Patient has a history of COPD and fixed asthma. Severe obstruction based on spirometry. In May we tried starting her on Trelegy (from Symbicort) to see if she would benefit. She still believes that Symbicort is the most beneficial. She had a flare at the beginning of the month, was treated with pred + doxy. She believes that she is averaging about 3 flares a year. She is experiencing more congestion, thick mucous. Fatigue and dyspnea. She has oxygen - uses it at home but too heavy when she exerts so she doesn't use. She has [employer], put wants lighter portable concentrator. Needs lightest we can get.    Review of Systems      Objective:   Physical Exam Vitals:   02/08/17 1011  BP: 118/78  Pulse: 62  SpO2: 98%  Weight: 142 lb 9.6 oz (64.7 kg)  Height: 5\' 7"  (1.702 m)   Gen: Pleasant, Elderly woman, well-nourished, in no distress,  normal affect  ENT: No lesions,  mouth clear,  oropharynx clear, no postnasal drip  Neck: No JVD, no TMG, no carotid bruits  Lungs: No use of accessory muscles, quite distant, no wheeze,  clear without rales or rhonchi  Cardiovascular: RRR, heart sounds normal, no murmur or gallops, no peripheral edema  Musculoskeletal: No deformities, no cyanosis or clubbing  Neuro: alert, non focal  Skin: Warm, no lesions or rashes     Assessment & Plan:  COPD (chronic obstructive pulmonary disease) Continue Symbicort  Start Spiriva respimat 2 puffs once a day.  We will work on getting you a lighter, more portable O2 system. Hopefully a portable concentrator.  Start mucinex 600mg  twice a day.  Get the flu shot next visit Follow with Dr Lamonte Sakai next available to review your status.  COPD exacerbation (HCC) Take prednisone taper as directed  Take azithromycin until completely gone  Chronic respiratory failure with hypoxia ([place]) She has not been using her oxygen because his too heavy to carry. We will work on trying to get her a lighter portable oxygen concentrator. We will likely have to do this directly through the Company. [facility] does not provide.  Baltazar Apo, MD, PhD 02/08/2017, 10:32 AM [facility] [phone] or if no answer [phone]

## 2017-02-08 NOTE — Assessment & Plan Note (Signed)
She has not been using her oxygen because his too heavy to carry. We will work on trying to get her a lighter portable oxygen concentrator. We will likely have to do this directly through the Company. [facility] does not provide.

## 2017-02-08 NOTE — Patient Instructions (Addendum)
Continue Symbicort  Start Spiriva respimat 2 puffs once a day.  We will work on getting you a lighter, more portable O2 system. Hopefully a portable concentrator.  Start mucinex 600mg  twice a day Take prednisone taper as directed  Take azithromycin until completely gone.  Get the flu shot next visit Follow with Dr Lamonte Sakai next available to review your status.

## 2017-02-11 ENCOUNTER — Ambulatory Visit (INDEPENDENT_AMBULATORY_CARE_PROVIDER_SITE_OTHER): Payer: Medicare Other | Admitting: *Deleted

## 2017-02-11 ENCOUNTER — Other Ambulatory Visit: Payer: Self-pay | Admitting: Family

## 2017-02-11 DIAGNOSIS — J449 Chronic obstructive pulmonary disease, unspecified: Secondary | ICD-10-CM | POA: Diagnosis not present

## 2017-02-11 DIAGNOSIS — I10 Essential (primary) hypertension: Secondary | ICD-10-CM

## 2017-02-11 NOTE — Progress Notes (Signed)
Pt in office for qualifying walk only.

## 2017-02-12 ENCOUNTER — Other Ambulatory Visit: Payer: Self-pay | Admitting: Family

## 2017-02-12 DIAGNOSIS — I10 Essential (primary) hypertension: Secondary | ICD-10-CM

## 2017-03-18 ENCOUNTER — Ambulatory Visit (INDEPENDENT_AMBULATORY_CARE_PROVIDER_SITE_OTHER): Payer: Medicare Other | Admitting: Emergency Medicine

## 2017-03-18 ENCOUNTER — Encounter: Payer: Self-pay | Admitting: Emergency Medicine

## 2017-03-18 DIAGNOSIS — J301 Allergic rhinitis due to pollen: Secondary | ICD-10-CM | POA: Diagnosis not present

## 2017-03-18 DIAGNOSIS — J449 Chronic obstructive pulmonary disease, unspecified: Secondary | ICD-10-CM | POA: Diagnosis not present

## 2017-03-18 DIAGNOSIS — J9611 Chronic respiratory failure with hypoxia: Secondary | ICD-10-CM | POA: Diagnosis not present

## 2017-03-18 NOTE — Patient Instructions (Addendum)
Please continue your Symbicort as you are taking it. Remember to rinse and gargle after taking this medicine Take albuterol 2 puffs up to every 4 hours if needed for shortness of breath.  We will arrange for a 6 minute walk test to evaluate your oxygen level when exercising Start using your dymista 2 sprays each nostril twice a day Get your flu shot as planned We discussed advance directives today. This will help with setting up a [place], goals for your long term care as we go forward. We can discuss further at future visits.  Follow with Dr Lamonte Sakai in 6 months or sooner if you have any problems

## 2017-03-18 NOTE — Assessment & Plan Note (Signed)
She has oxygen at home but has not desaturated with exertion on our attempts here in the office. She wants a portable O2 concentrator. We will perform a 6 minute walk to see if she qualifies.

## 2017-03-18 NOTE — Assessment & Plan Note (Signed)
Her exacerbation has resolved. She wonders about chronic prednisone given her low energy, cough, mucous production. I discussed the pros and cons with her today. I do not want to start chronic prednisone at this time. She does not want to add back Spiriva for now. We will continue Symbicort, albuterol prn  Please continue your Symbicort as you are taking it. Remember to rinse and gargle after taking this medicine Take albuterol 2 puffs up to every 4 hours if needed for shortness of breath.  We will arrange for a 6 minute walk test to evaluate your oxygen level when exercising Get your flu shot as planned We discussed advance directives today. This will help with setting up a [place], goals for your long term care as we go forward. We can discuss further at future visits.  Follow with Dr Lamonte Sakai in 6 months or sooner if you have any problems

## 2017-03-18 NOTE — Progress Notes (Signed)
Subjective:    Patient ID: Diane Patterson, female    DOB: 12/03/1945, 71 y.o.   MRN: [id number]  HPI 71 year old former smoker, formerly followed by Dr Elsworth Soho for fixed asthma / COPD. I have personally reviewed her spirometry and pulmonary function testing from 2009 and then 2016, shows very severe obstruction with an FEV1 of approximately 0.73 L. A review of her chart reveals that she has frequent exacerbations. He was last seen here 01/19/16. Her bronchodilator regimen is . She has been tried on Incruse and Trelegy before based on the notes but she never took these. She has stayed on the Symbicort. She has exertional SOB. She does not cough. She has wheezing frequently. She feels nasal gtt, throat irritation. She was just treated w pred about 1 month ago. She takes albuterol about once every several months. She is reliable with the flu shot. Prevnar in 2015.   ROV 02/08/17 -- Patient has a history of COPD and fixed asthma. Severe obstruction based on spirometry. In May we tried starting her on Trelegy (from Symbicort) to see if she would benefit. She still believes that Symbicort is the most beneficial. She had a flare at the beginning of the month, was treated with pred + doxy. She believes that she is averaging about 3 flares a year. She is experiencing more congestion, thick mucous. Fatigue and dyspnea. She has oxygen - uses it at home but too heavy when she exerts so she doesn't use. She has [employer], put wants lighter portable concentrator. Needs lightest we can get.   ROV 03/18/17 -- This is a follow-up visit for COPD and fixed asthma with severe obstruction on spirometry. She had a flare in the beginning of August and then appeared to be acutely exacerbated when I saw her on 02/08/17. I treated her with prednisone and azithromycin. At that time I also added Spiriva Respimat to her existing Symbicort. She wants a POC but has not desaturated on our ambulatory oximetry. She is getting her flu shot on  10/5. Tells me that her cough, mucous, wheeze are better since last time. She complains today of fatigue, low energy. She has chronic hoarseness, congestion. Difficult to say whether she benefited from the Spiriva because she was on pred at the time. She has been on dymista, but expensive. She will need referral to a new [place] PCP as [facility] leaving.    Review of Systems      Objective:   Physical Exam Vitals:   03/18/17 1208 03/18/17 1209  BP:  132/86  Pulse:  80  SpO2:  97%  Weight: 144 lb (65.3 kg)   Height: 5' 7.5" (1.715 m)    Gen: Pleasant, Elderly woman, well-nourished, in no distress,  normal affect  ENT: No lesions,  mouth clear,  oropharynx clear, no postnasal drip  Neck: No JVD, no stridor  Lungs: No use of accessory muscles, better air movement, no wheeze  Cardiovascular: RRR, heart sounds normal, no murmur or gallops, no peripheral edema  Musculoskeletal: No deformities, no cyanosis or clubbing  Neuro: alert, non focal  Skin: Warm, no lesions or rashes     Assessment & Plan:  COPD (chronic obstructive pulmonary disease) Her exacerbation has resolved. She wonders about chronic prednisone given her low energy, cough, mucous production. I discussed the pros and cons with her today. I do not want to start chronic prednisone at this time. She does not want to add back Spiriva for now. We will continue Symbicort, albuterol prn  Please continue your Symbicort as you are taking it. Remember to rinse and gargle after taking this medicine Take albuterol 2 puffs up to every 4 hours if needed for shortness of breath.  We will arrange for a 6 minute walk test to evaluate your oxygen level when exercising Get your flu shot as planned We discussed advance directives today. This will help with setting up a [place], goals for your long term care as we go forward. We can discuss further at future visits.  Follow with Dr Lamonte Sakai in 6 months or sooner if you  have any problems  Allergic rhinitis Start using your dymista 2 sprays each nostril twice a day  Chronic respiratory failure with hypoxia ([place]) She has oxygen at home but has not desaturated with exertion on our attempts here in the office. She wants a portable O2 concentrator. We will perform a 6 minute walk to see if she qualifies.  Baltazar Apo, MD, PhD 03/18/2017, 12:35 PM [facility] [phone] or if no answer [phone]

## 2017-03-18 NOTE — Assessment & Plan Note (Signed)
Start using your dymista 2 sprays each nostril twice a day

## 2017-04-13 ENCOUNTER — Telehealth: Payer: Self-pay | Admitting: Emergency Medicine

## 2017-04-13 MED ORDER — AZITHROMYCIN 250 MG PO TABS: ORAL_TABLET | ORAL | 0 refills | Status: DC

## 2017-04-13 NOTE — Telephone Encounter (Signed)
Called and spoke with pt and she stated that she cannot take the mucinex  Due to if making her BP goes up.   Pt is wanting to know if RB will send in a zpak for her.  RB please advise. Thanks

## 2017-04-13 NOTE — Telephone Encounter (Signed)
Have her start mucinex 600mg  bid Take OTC decongestant like tylenol cold and flu as directed.  Ok to offer her hycodan 5cc q6h prn for cough if she wants; disp 120cc, no RF  Have her call back in next few days if wheeze persists.

## 2017-04-13 NOTE — Telephone Encounter (Signed)
Called and spoke with pt and she is aware of RB recs and that the zpak has been sent to her pharmacy.

## 2017-04-13 NOTE — Telephone Encounter (Signed)
Spoke with the pt  She is c/o non prod cough x 3 days  She is c/o increased SOB, wheezing and her chest feels tight  She had to use her albuterol inhaler last night with little relief  No known fever, but did have some chills this am  She states does not want to come in for an appt  Please advise, thanks!

## 2017-04-13 NOTE — Telephone Encounter (Signed)
Yes ok to prescribe z-pack

## 2017-05-20 ENCOUNTER — Telehealth: Payer: Self-pay | Admitting: Emergency Medicine

## 2017-05-20 DIAGNOSIS — J441 Chronic obstructive pulmonary disease with (acute) exacerbation: Secondary | ICD-10-CM

## 2017-05-20 MED ORDER — BUDESONIDE-FORMOTEROL FUMARATE 160-4.5 MCG/ACT IN AERO: 2.0000 | INHALATION_SPRAY | Freq: Two times a day (BID) | RESPIRATORY_TRACT | 5 refills | Status: DC

## 2017-05-20 NOTE — Telephone Encounter (Signed)
ATC pt, no answer. Left message for pt to call back.'  [facility] [address] [facility]

## 2017-05-25 NOTE — Telephone Encounter (Signed)
Pt is aware that her prescription was sent in. Nothing further was needed.

## 2017-07-02 ENCOUNTER — Other Ambulatory Visit: Payer: Self-pay

## 2017-07-02 ENCOUNTER — Encounter (HOSPITAL_COMMUNITY): Payer: Self-pay | Admitting: Emergency Medicine

## 2017-07-02 ENCOUNTER — Ambulatory Visit (HOSPITAL_COMMUNITY)
Admission: EM | Admit: 2017-07-02 | Discharge: 2017-07-02 | Disposition: A | Discharge status: Home / Self Care | Payer: Medicare Other

## 2017-07-02 DIAGNOSIS — R05 Cough: Secondary | ICD-10-CM | POA: Diagnosis not present

## 2017-07-02 DIAGNOSIS — R062 Wheezing: Secondary | ICD-10-CM | POA: Diagnosis not present

## 2017-07-02 DIAGNOSIS — J441 Chronic obstructive pulmonary disease with (acute) exacerbation: Secondary | ICD-10-CM | POA: Diagnosis not present

## 2017-07-02 MED ORDER — DOXYCYCLINE HYCLATE 100 MG PO CAPS: 100.0000 mg | ORAL_CAPSULE | Freq: Two times a day (BID) | ORAL | 0 refills | Status: DC

## 2017-07-02 MED ORDER — IPRATROPIUM-ALBUTEROL 0.5-2.5 (3) MG/3ML IN SOLN
3.0000 mL | Freq: Once | RESPIRATORY_TRACT | Status: AC
  Administered 2017-07-02: 3 mL via RESPIRATORY_TRACT

## 2017-07-02 MED ORDER — PREDNISONE 20 MG PO TABS: 20.0000 mg | ORAL_TABLET | Freq: Every day | ORAL | 0 refills | Status: DC

## 2017-07-02 MED ORDER — IPRATROPIUM-ALBUTEROL 0.5-2.5 (3) MG/3ML IN SOLN
RESPIRATORY_TRACT | Status: AC
  Filled 2017-07-02: qty 3

## 2017-07-02 MED ORDER — IPRATROPIUM BROMIDE 0.02 % IN SOLN
RESPIRATORY_TRACT | Status: AC
  Filled 2017-07-02: qty 2.5

## 2017-07-02 NOTE — ED Provider Notes (Signed)
07/02/2017 3:03 PM   DOB: 03/20/1946 / MRN: [id number]  SUBJECTIVE:  Diane Patterson is a 72 y.o. female with a history of COPD presenting for URI symptoms that started 2 days ago she complains of fever cough body aches and chills.  Per the chart she has had 2 flu shots in 2018.  Reports that her husband had the flu she contracted it from him.  She denies chest pain, shortness of breath, dizziness, diaphoresis, leg swelling.  Immunization History  Administered Date(s) Administered  . Influenza Split 03/15/2011, 03/06/2012  . Influenza Whole 03/12/2009, 02/10/2010  . Influenza, High Dose Seasonal PF 04/01/2015, 03/05/2016, 03/18/2017  . Influenza,inj,Quad PF,6+ Mos 03/30/2013, 03/11/2014, 03/19/2017  . Pneumococcal Conjugate-13 11/14/2013  . Pneumococcal Polysaccharide-23 09/11/2010     She has No Known Allergies.   She  has a past medical history of Asthma, Chronic airflow obstruction ([place]), Hypertension, Tobacco abuse, and Venous insufficiency.    She  reports that she quit smoking about 3 years ago. Her smoking use included cigarettes. She has a 11.25 pack-year smoking history. She quit smokeless tobacco use about 3 years ago. She reports that she does not drink alcohol or use drugs. She  has no sexual activity history on file. The patient  has a past surgical history that includes Vesicovaginal fistula closure w/ TAH; Neck surgery; cataract; and no colonoscopy.  Her family history includes Asthma in her father; Diabetes in her mother; Emphysema in her father; Heart disease in her father.  ROS  Per HPI  OBJECTIVE:  Pulse 96   Temp 97.9 F (36.6 C) (Oral)   Resp (!) 22   LMP  (LMP Unknown)   SpO2 98%   Physical Exam  Constitutional: She is oriented to person, place, and time. She is active.  Non-toxic appearance.  Cardiovascular: Normal rate, regular rhythm, S1 normal, S2 normal, normal heart sounds and intact distal pulses. Exam reveals no gallop, no friction rub and no  decreased pulses.  No murmur heard. Pulmonary/Chest: Effort normal. No stridor. No tachypnea. No respiratory distress. She has wheezes. She has no rales. She exhibits no tenderness.  Musculoskeletal: She exhibits no edema.  Neurological: She is alert and oriented to person, place, and time. She displays normal reflexes. No cranial nerve deficit. She exhibits normal muscle tone. Coordination normal.  Skin: Skin is warm and dry. She is not diaphoretic. No pallor.      No results found for this or any previous visit (from the past 72 hour(s)).  No results found.  ASSESSMENT AND PLAN:  No orders of the defined types were placed in this encounter.     COPD exacerbation ([place]) - Likely secondary to influenza.  Fortunately she has been immunized.  Given her wheezing, cough and her history of COPD we will treat her for a COPD exacerbation with prednisone and doxycycline.  There is a question of possible diabetes in her chart.  Given this I will prescribe a lower dose of prednisone than normal.  Her wheezing cleared with 1 DuoNeb in the office.       The patient is advised to call or return to clinic if she does not see an improvement in symptoms, or to seek the care of the closest emergency department if she worsens with the above plan.   Philis Fendt, MHS, PA-C 07/02/2017 3:03 PM    Tereasa Coop, PA-C 07/02/17 1507

## 2017-07-02 NOTE — ED Triage Notes (Signed)
Patient started feeling sick 2 days ago.  Husband has been sick as well.  Patient has chills, runny nose, bad cough, fever, aches.  Vomited 2 times and diarrhea

## 2017-07-02 NOTE — Discharge Instructions (Signed)
Please continue all of your other medications as scheduled.  Go ahead and start the prednisone and doxycycline today.  I think you should consider scheduling your albuterol inhaler every 4-6 hours for the next 2-3 days to keep your lungs open.  If you have any trouble please come back.

## 2017-07-11 ENCOUNTER — Other Ambulatory Visit: Payer: Self-pay

## 2017-07-11 ENCOUNTER — Emergency Department (HOSPITAL_COMMUNITY): Payer: Medicare Other

## 2017-07-11 ENCOUNTER — Encounter (HOSPITAL_COMMUNITY): Payer: Self-pay | Admitting: *Deleted

## 2017-07-11 ENCOUNTER — Inpatient Hospital Stay (HOSPITAL_COMMUNITY)
Admission: EM | Admit: 2017-07-11 | Discharge: 2017-07-16 | DRG: 190 | Disposition: A | Discharge status: Home Health | Payer: Medicare Other | Attending: Internal Medicine | Admitting: Internal Medicine

## 2017-07-11 DIAGNOSIS — K5900 Constipation, unspecified: Secondary | ICD-10-CM | POA: Diagnosis present

## 2017-07-11 DIAGNOSIS — Z8249 Family history of ischemic heart disease and other diseases of the circulatory system: Secondary | ICD-10-CM

## 2017-07-11 DIAGNOSIS — I4891 Unspecified atrial fibrillation: Secondary | ICD-10-CM | POA: Diagnosis present

## 2017-07-11 DIAGNOSIS — Z825 Family history of asthma and other chronic lower respiratory diseases: Secondary | ICD-10-CM

## 2017-07-11 DIAGNOSIS — Z9981 Dependence on supplemental oxygen: Secondary | ICD-10-CM | POA: Diagnosis not present

## 2017-07-11 DIAGNOSIS — J9611 Chronic respiratory failure with hypoxia: Secondary | ICD-10-CM | POA: Diagnosis present

## 2017-07-11 DIAGNOSIS — J181 Lobar pneumonia, unspecified organism: Secondary | ICD-10-CM | POA: Diagnosis not present

## 2017-07-11 DIAGNOSIS — I959 Hypotension, unspecified: Secondary | ICD-10-CM | POA: Diagnosis present

## 2017-07-11 DIAGNOSIS — N179 Acute kidney failure, unspecified: Secondary | ICD-10-CM | POA: Diagnosis present

## 2017-07-11 DIAGNOSIS — Z87891 Personal history of nicotine dependence: Secondary | ICD-10-CM | POA: Diagnosis not present

## 2017-07-11 DIAGNOSIS — J440 Chronic obstructive pulmonary disease with acute lower respiratory infection: Secondary | ICD-10-CM | POA: Diagnosis present

## 2017-07-11 DIAGNOSIS — D72828 Other elevated white blood cell count: Secondary | ICD-10-CM | POA: Diagnosis present

## 2017-07-11 DIAGNOSIS — J310 Chronic rhinitis: Secondary | ICD-10-CM | POA: Diagnosis present

## 2017-07-11 DIAGNOSIS — I10 Essential (primary) hypertension: Secondary | ICD-10-CM | POA: Diagnosis present

## 2017-07-11 DIAGNOSIS — Z833 Family history of diabetes mellitus: Secondary | ICD-10-CM

## 2017-07-11 DIAGNOSIS — J441 Chronic obstructive pulmonary disease with (acute) exacerbation: Principal | ICD-10-CM | POA: Diagnosis present

## 2017-07-11 DIAGNOSIS — R0602 Shortness of breath: Secondary | ICD-10-CM | POA: Diagnosis present

## 2017-07-11 DIAGNOSIS — J449 Chronic obstructive pulmonary disease, unspecified: Secondary | ICD-10-CM

## 2017-07-11 DIAGNOSIS — E785 Hyperlipidemia, unspecified: Secondary | ICD-10-CM | POA: Diagnosis present

## 2017-07-11 DIAGNOSIS — J189 Pneumonia, unspecified organism: Secondary | ICD-10-CM | POA: Diagnosis present

## 2017-07-11 DIAGNOSIS — T380X5A Adverse effect of glucocorticoids and synthetic analogues, initial encounter: Secondary | ICD-10-CM | POA: Diagnosis present

## 2017-07-11 LAB — CBC WITH DIFFERENTIAL/PLATELET
Basophils Absolute: 0 10*3/uL (ref 0.0–0.1)
Basophils Relative: 0 %
Eosinophils Absolute: 0.1 10*3/uL (ref 0.0–0.7)
Eosinophils Relative: 1 %
HCT: 40.7 % (ref 36.0–46.0)
Hemoglobin: 13.5 g/dL (ref 12.0–15.0)
Lymphocytes Relative: 15 %
Lymphs Abs: 2.2 10*3/uL (ref 0.7–4.0)
MCH: 29.8 pg (ref 26.0–34.0)
MCHC: 33.2 g/dL (ref 30.0–36.0)
MCV: 89.8 fL (ref 78.0–100.0)
Monocytes Absolute: 0.9 10*3/uL (ref 0.1–1.0)
Monocytes Relative: 6 %
Neutro Abs: 11.9 10*3/uL — ABNORMAL HIGH (ref 1.7–7.7)
Neutrophils Relative %: 78 %
Platelets: 339 10*3/uL (ref 150–400)
RBC: 4.53 MIL/uL (ref 3.87–5.11)
RDW: 14 % (ref 11.5–15.5)
WBC: 15.2 10*3/uL — ABNORMAL HIGH (ref 4.0–10.5)

## 2017-07-11 LAB — COMPREHENSIVE METABOLIC PANEL
ALT: 25 U/L (ref 14–54)
AST: 24 U/L (ref 15–41)
Albumin: 3.9 g/dL (ref 3.5–5.0)
Alkaline Phosphatase: 77 U/L (ref 38–126)
Anion gap: 14 (ref 5–15)
BUN: 34 mg/dL — ABNORMAL HIGH (ref 6–20)
CO2: 27 mmol/L (ref 22–32)
Calcium: 9.2 mg/dL (ref 8.9–10.3)
Chloride: 94 mmol/L — ABNORMAL LOW (ref 101–111)
Creatinine, Ser: 1.04 mg/dL — ABNORMAL HIGH (ref 0.44–1.00)
GFR calc Af Amer: >60 mL/min (ref >60)
GFR calc non Af Amer: 52 mL/min — ABNORMAL LOW (ref >60)
Glucose, Bld: 153 mg/dL — ABNORMAL HIGH (ref 65–99)
Potassium: 3.3 mmol/L — ABNORMAL LOW (ref 3.5–5.1)
Sodium: 135 mmol/L (ref 135–145)
Total Bilirubin: 1 mg/dL (ref 0.3–1.2)
Total Protein: 6.9 g/dL (ref 6.5–8.1)

## 2017-07-11 LAB — CREATININE, SERUM
CREATININE: 1.13 mg/dL — AB (ref 0.44–1.00)
GFR calc Af Amer: 55 mL/min — ABNORMAL LOW (ref >60)
GFR, EST NON AFRICAN AMERICAN: 47 mL/min — AB (ref >60)

## 2017-07-11 LAB — CBC
HCT: 38 % (ref 36.0–46.0)
HEMOGLOBIN: 12.8 g/dL (ref 12.0–15.0)
MCH: 30.4 pg (ref 26.0–34.0)
MCHC: 33.7 g/dL (ref 30.0–36.0)
MCV: 90.3 fL (ref 78.0–100.0)
Platelets: 315 10*3/uL (ref 150–400)
RBC: 4.21 MIL/uL (ref 3.87–5.11)
RDW: 14 % (ref 11.5–15.5)
WBC: 12.9 10*3/uL — ABNORMAL HIGH (ref 4.0–10.5)

## 2017-07-11 LAB — MAGNESIUM: Magnesium: 1.9 mg/dL (ref 1.7–2.4)

## 2017-07-11 LAB — INFLUENZA PANEL BY PCR (TYPE A & B)
Influenza A By PCR: NEGATIVE
Influenza B By PCR: NEGATIVE

## 2017-07-11 MED ORDER — CEFTRIAXONE SODIUM 1 G IJ SOLR
1.0000 g | INTRAMUSCULAR | Status: DC
  Administered 2017-07-12 – 2017-07-13 (×2): 1 g via INTRAVENOUS
  Filled 2017-07-11 (×2): qty 10

## 2017-07-11 MED ORDER — HEPARIN SODIUM (PORCINE) 5000 UNIT/ML IJ SOLN
5000.0000 [IU] | Freq: Three times a day (TID) | INTRAMUSCULAR | Status: DC
  Administered 2017-07-11 – 2017-07-15 (×11): 5000 [IU] via SUBCUTANEOUS
  Filled 2017-07-11 (×11): qty 1

## 2017-07-11 MED ORDER — AZELASTINE HCL 0.1 % NA SOLN
2.0000 | Freq: Every day | NASAL | Status: DC
  Administered 2017-07-12 – 2017-07-16 (×5): 2 via NASAL
  Filled 2017-07-11: qty 30

## 2017-07-11 MED ORDER — DILTIAZEM HCL ER 90 MG PO CP12
90.0000 mg | ORAL_CAPSULE | Freq: Every day | ORAL | Status: DC
  Administered 2017-07-11 – 2017-07-15 (×5): 90 mg via ORAL
  Filled 2017-07-11 (×6): qty 1

## 2017-07-11 MED ORDER — SODIUM CHLORIDE 0.9 % IV SOLN
INTRAVENOUS | Status: DC
  Administered 2017-07-11 – 2017-07-12 (×3): via INTRAVENOUS

## 2017-07-11 MED ORDER — ONDANSETRON HCL 4 MG/2ML IJ SOLN: 4.0000 mg | Freq: Four times a day (QID) | INTRAMUSCULAR | Status: DC | PRN

## 2017-07-11 MED ORDER — AZELASTINE-FLUTICASONE 137-50 MCG/ACT NA SUSP: 2.0000 | Freq: Every day | NASAL | Status: DC

## 2017-07-11 MED ORDER — LEVOFLOXACIN 500 MG PO TABS: 500.0000 mg | ORAL_TABLET | Freq: Once | ORAL | Status: DC

## 2017-07-11 MED ORDER — METHYLPREDNISOLONE SODIUM SUCC 125 MG IJ SOLR: 125.0000 mg | Freq: Once | INTRAMUSCULAR | Status: DC

## 2017-07-11 MED ORDER — ACETAMINOPHEN 325 MG PO TABS
650.0000 mg | ORAL_TABLET | Freq: Four times a day (QID) | ORAL | Status: DC | PRN
  Administered 2017-07-12 (×2): 650 mg via ORAL
  Filled 2017-07-11 (×3): qty 2

## 2017-07-11 MED ORDER — HYDROCODONE-ACETAMINOPHEN 5-325 MG PO TABS
1.0000 | ORAL_TABLET | Freq: Once | ORAL | Status: AC
  Administered 2017-07-11: 1 via ORAL
  Filled 2017-07-11: qty 1

## 2017-07-11 MED ORDER — ACETAMINOPHEN 650 MG RE SUPP: 650.0000 mg | Freq: Four times a day (QID) | RECTAL | Status: DC | PRN

## 2017-07-11 MED ORDER — POLYETHYLENE GLYCOL 3350 17 G PO PACK
17.0000 g | PACK | Freq: Every day | ORAL | Status: DC
  Administered 2017-07-11 – 2017-07-12 (×2): 17 g via ORAL
  Filled 2017-07-11 (×2): qty 1

## 2017-07-11 MED ORDER — IPRATROPIUM-ALBUTEROL 0.5-2.5 (3) MG/3ML IN SOLN
3.0000 mL | Freq: Four times a day (QID) | RESPIRATORY_TRACT | Status: DC
  Administered 2017-07-11: 3 mL via RESPIRATORY_TRACT
  Filled 2017-07-11: qty 3

## 2017-07-11 MED ORDER — FLUTICASONE PROPIONATE 50 MCG/ACT NA SUSP
2.0000 | Freq: Every day | NASAL | Status: DC
  Administered 2017-07-12 – 2017-07-16 (×5): 2 via NASAL
  Filled 2017-07-11: qty 16

## 2017-07-11 MED ORDER — METHOCARBAMOL 500 MG PO TABS
500.0000 mg | ORAL_TABLET | Freq: Three times a day (TID) | ORAL | Status: DC | PRN
  Administered 2017-07-12 – 2017-07-15 (×8): 500 mg via ORAL
  Filled 2017-07-11 (×9): qty 1

## 2017-07-11 MED ORDER — ONDANSETRON HCL 4 MG PO TABS: 4.0000 mg | ORAL_TABLET | Freq: Four times a day (QID) | ORAL | Status: DC | PRN

## 2017-07-11 MED ORDER — DEXTROSE 5 % IV SOLN
500.0000 mg | INTRAVENOUS | Status: DC
  Administered 2017-07-12 – 2017-07-13 (×2): 500 mg via INTRAVENOUS
  Filled 2017-07-11 (×2): qty 500

## 2017-07-11 MED ORDER — DM-GUAIFENESIN ER 30-600 MG PO TB12
1.0000 | ORAL_TABLET | Freq: Two times a day (BID) | ORAL | Status: DC
  Administered 2017-07-11 – 2017-07-12 (×2): 1 via ORAL
  Filled 2017-07-11 (×2): qty 1

## 2017-07-11 MED ORDER — SODIUM CHLORIDE 0.9 % IV BOLUS (SEPSIS)
500.0000 mL | Freq: Once | INTRAVENOUS | Status: AC
  Administered 2017-07-11: 500 mL via INTRAVENOUS

## 2017-07-11 MED ORDER — DEXTROSE 5 % IV SOLN
500.0000 mg | Freq: Once | INTRAVENOUS | Status: AC
  Administered 2017-07-11: 500 mg via INTRAVENOUS
  Filled 2017-07-11: qty 500

## 2017-07-11 MED ORDER — DEXTROSE 5 % IV SOLN
1.0000 g | Freq: Once | INTRAVENOUS | Status: AC
  Administered 2017-07-11: 1 g via INTRAVENOUS
  Filled 2017-07-11: qty 10

## 2017-07-11 MED ORDER — METHYLPREDNISOLONE SODIUM SUCC 40 MG IJ SOLR
40.0000 mg | Freq: Two times a day (BID) | INTRAMUSCULAR | Status: DC
  Administered 2017-07-11 – 2017-07-16 (×10): 40 mg via INTRAVENOUS
  Filled 2017-07-11 (×10): qty 1

## 2017-07-11 MED ORDER — BUDESONIDE 0.5 MG/2ML IN SUSP
0.5000 mg | Freq: Two times a day (BID) | RESPIRATORY_TRACT | Status: DC
  Administered 2017-07-11 – 2017-07-16 (×10): 0.5 mg via RESPIRATORY_TRACT
  Filled 2017-07-11 (×10): qty 2

## 2017-07-11 NOTE — H&P (Signed)
History and Physical    Diane Patterson QPY:[id number] DOB: [birth year] DOA: 07/11/2017  PCP: Patient, No Pcp Per   I have briefly reviewed patients previous medical reports in [facility].  Patient coming from: home  Chief Complaint: SOB, increase wheezing, general malaise and coughing.  HPI: Diane Patterson is a 72 y/o woman with PMH significant for COPD/asthma and HTN; who presented to ED with complaints of productive cough, SOB, increase wheezing and general malaise. She reported symptoms to be present for over a week and with treatment by urgent care using abx's (doxycycline) and steroids tapering; she said her symptoms didn't improved much and after steroids were completely tapered she had worsening in her breathing. No objective fever, but reported chills. Patient also expressed some lower back pain and constipation.  No CP, no palpitations, no nausea, no vomiting, no abd pain, hematuria, dysuria, hematochezia and melena.  ED Course: CXR positive for PNA and on exam patient was tachypnea, wheezing and requiring oxygen supplementation. Patient also found with AKI. Nebulizer treatment, antibiotics and IVF's given. [facility] called to admit patient for further evaluation and treatment.   Review of Systems:  All other systems reviewed and apart from HPI, are negative.  Past Medical History:  Diagnosis Date  . Asthma   . Chronic airflow obstruction (HCC)   . Hypertension   . Tobacco abuse   . Venous insufficiency     Past Surgical History:  Procedure Laterality Date  . cataract    . NECK SURGERY    . no colonoscopy     "afraid to "; [facility] reviewed  . VESICOVAGINAL FISTULA CLOSURE W/ TAH      Social History  reports that she quit smoking about 3 years ago. Her smoking use included cigarettes. She has a 11.25 pack-year smoking history. She quit smokeless tobacco use about 3 years ago. She reports that she does not drink alcohol or use drugs.  No Known Allergies  Family  History  Problem Relation Age of Onset  . Diabetes Mother   . Emphysema Father   . Asthma Father   . Heart disease Father      Prior to Admission medications   Medication Sig Start Date End Date Taking? Authorizing Provider  acetaminophen (TYLENOL) 325 MG tablet Take 650 mg by mouth every 6 (six) hours as needed for mild pain.    Yes [provider]  albuterol (PROVENTIL HFA;VENTOLIN HFA) 108 (90 Base) MCG/ACT inhaler Inhale 2 puffs into the lungs every 4 (four) hours as needed for wheezing or shortness of breath. 12/17/16  Yes Golden Circle, FNP  Azelastine-Fluticasone 137-50 MCG/ACT SUSP Place 2 sprays into the nose once. Patient taking differently: Place 2 sprays into the nose daily.  10/14/16 11/15/17 Yes Collene Gobble, MD  budesonide-formoterol [facility]) 160-4.5 MCG/ACT inhaler Inhale 2 puffs into the lungs 2 (two) times daily. 05/20/17  Yes Collene Gobble, MD  diltiazem (CARDIZEM SR) 90 MG 12 hr capsule TAKE 1 CAPSULE BY MOUTH DAILY 09/15/16  Yes Golden Circle, FNP  doxycycline (VIBRAMYCIN) 100 MG capsule Take 1 capsule (100 mg total) by mouth 2 (two) times daily for 10 days. 07/02/17 07/12/17 Yes Tereasa Coop, PA-C  hydrochlorothiazide (MICROZIDE) 12.5 MG capsule TAKE 1 CAPSULE (12.5 MG TOTAL) BY MOUTH DAILY. 02/15/17  Yes Golden Circle, FNP  potassium chloride SA (K-DUR,KLOR-CON) 20 MEQ tablet Take 1 tablet (20 mEq total) by mouth daily. 07/23/16  Yes Jerline Pain, MD  predniSONE (DELTASONE) 20  MG tablet Take 1 tablet (20 mg total) by mouth daily with breakfast. 07/02/17  Yes Tereasa Coop, PA-C    Physical Exam: Vitals:   07/11/17 1337 07/11/17 1600  BP: 139/88 (!) 157/67  Pulse: 72 95  Resp: 20 18  Temp: 98.2 F (36.8 C)   TempSrc: Oral   SpO2: 98% 98%    Constitutional: no fever, mild resp distress appreciated and unable to speak in full sentences; no CP, no nausea, no vomiting. Eyes: PERTLA, lids and conjunctivae normal, no icterus ENMT: Mucous  membranes were dry on exam. Posterior pharynx clear of any exudate or lesions. No Thrush Neck: supple, no masses, no thyromegaly, no JVD. Respiratory: positive exp wheezing, tachypneic, appreciated rhonchi and fair air movement. No crackles. No using accessory muscles.  Cardiovascular: S1 & S2 heard, regular rate and rhythm, positive SEM, no rubs / gallops. No extremity edema. 2+ pedal pulses. No carotid bruits.  Abdomen: No distension, no tenderness, no masses palpated. No hepatosplenomegaly. Bowel sounds normal.  Musculoskeletal: no clubbing / cyanosis. No joint deformity upper and lower extremities. No contractures. Normal muscle tone.  Skin: no rashes, lesions, ulcers. No induration Neurologic: CN 2-12 grossly intact. Sensation intact, DTR normal. Strength 4/5 in all 4 limbs due to poor effort.  Psychiatric: Normal judgment and insight. Alert and oriented x 3. Normal mood.     Labs on Admission: I have personally reviewed following labs and imaging studies  CBC: Recent Labs  Lab 07/11/17 1411  WBC 15.2*  NEUTROABS 11.9*  HGB 13.5  HCT 40.7  MCV 89.8  PLT 638   Basic Metabolic Panel: Recent Labs  Lab 07/11/17 1411  NA 135  K 3.3*  CL 94*  CO2 27  GLUCOSE 153*  BUN 34*  CREATININE 1.04*  CALCIUM 9.2   Liver Function Tests: Recent Labs  Lab 07/11/17 1411  AST 24  ALT 25  ALKPHOS 77  BILITOT 1.0  PROT 6.9  ALBUMIN 3.9   Urine analysis:    Component Value Date/Time   COLORURINE LT. YELLOW 07/05/2011 1100   APPEARANCEUR CLEAR 07/05/2011 1100   LABSPEC 1.015 07/05/2011 1100   PHURINE 5.5 07/05/2011 1100   GLUCOSEU NEGATIVE 07/05/2011 1100   HGBUR TRACE-LYSED 07/05/2011 1100   BILIRUBINUR NEGATIVE 07/05/2011 1100   KETONESUR NEGATIVE 07/05/2011 1100   UROBILINOGEN 0.2 07/05/2011 1100   NITRITE NEGATIVE 07/05/2011 1100   LEUKOCYTESUR NEGATIVE 07/05/2011 1100    Radiological Exams on Admission: Dg Chest 2 View  Result Date: 07/11/2017 CLINICAL DATA:   Shortness of breath, nausea. Recently diagnosed with flu and exacerbation of COPD. Patient also reports back pain for the past 2 days since rolling over wrong in bed. Former smoker. EXAM: CHEST  2 VIEW COMPARISON:  Chest x-ray of June 16, 2015. FINDINGS: The lungs are hyperinflated with hemidiaphragm flattening. The lung markings are coarse in the lower lobes. There new increased density in the retrocardiac region. The heart and pulmonary vascularity are normal. There calcification in the wall of the thoracic aorta. The bony thorax exhibits no acute abnormality. IMPRESSION: Increased retrocardiac density likely on the left is compatible with atelectasis or pneumonia. There is underlying COPD. Followup PA and lateral chest X-ray is recommended in 3-4 weeks following trial of antibiotic therapy to ensure resolution and exclude underlying malignancy. Thoracic aortic atherosclerosis. Electronically Signed   By: David  Martinique M.D.   On: 07/11/2017 14:38    EKG:  None  Assessment/Plan 1-SOB, tachypnea and inability to speak in full sentences: due  to CAP and COPD exacerbation. -admitted to med surg -started on rocephin and zithromax  -also started on steroids, pulmicort and PRN duoneb  -flutter valve and mucinex ordered  -follow cultures results as per PNA protocol  -provide IVF's resuscitation and follow clinical response   2-Essential HTN -overall stable -will hold HCTZ due to acute kidney injury -follow VS and continue the rest of antihypertensive regimen   3-Constipation -will use miralax  4-Hyperlipidemia -continue statins   5-AKI (acute kidney injury) ([place]) -pre-renal in nature -will follow renal function -minimize/avoid nephrotoxic agents -give IVF's -depending improvement/response might need renal US and further work up.  6-rhinitis/nasal congestion -continue home Astelin/flonase  7-back pain Arneta Cliche -will use KPAD and PRN tylenol -PRN robaxin also ordered for muscle spasm    -vicodin for severe pain -will ask PT to provide evaluation     Time: 70 minutes   DVT prophylaxis:  Heparin  Code Status:  Full code Family Communication:  No family at bedside  Disposition Plan:  To be determine  Consults called:  None  Admission status:  LOS > 2 midnights, inpatient, med-surg bed     Barton Dubois MD [facility] Pager [phone]  If 7PM-7AM, please contact night-coverage [email or web address] Password [facility]  07/11/2017, 6:51 PM

## 2017-07-11 NOTE — ED Triage Notes (Signed)
Per EMS, pt from home complains of shortness of breath, nausea. Pt was recently diagnosed w/ flu/ COPD exacerbation 1 week ago. Pt also reports right back pain since rolling over in bed 2 days ago.  Pt given 10mg  albuterol, 0.5mg  atrovent, 125mg  solumedrol, 4mg  zofran.  160/107 HR 70s 18 92% on RA

## 2017-07-11 NOTE — ED Provider Notes (Signed)
[facility] DEPT Provider Note   CSN: [id number] Arrival date & time: 07/11/17  1309     History   Chief Complaint Chief Complaint  Patient presents with  . Shortness of Breath    HPI Diane Patterson is a 72 y.o. female.  The history is provided by the patient. No language interpreter was used.  Shortness of Breath  This is a new problem. The average episode lasts 1 week. The problem occurs continuously.The problem has been gradually worsening. Associated symptoms include cough. Pertinent negatives include no fever. She has tried oral steroids for the symptoms. The treatment provided no relief. Associated medical issues do not include asthma.  Pt reports she has had a cough for over a week.  Pt was seen at Urgent care and was treated with doxycycline and prednisone.  Pt reports cough is worse.    Past Medical History:  Diagnosis Date  . Asthma   . Chronic airflow obstruction (H[facility])   . Hypertension   . Tobacco abuse   . Venous insufficiency     Patient Active Problem List   Diagnosis Date Noted  . Chronic respiratory failure with hypoxia ([place]) 11/15/2016  . Allergic rhinitis 10/14/2016  . Contusion 12/01/2015  . Abscess of umbilicus 26/71/2458  . Right carotid bruit 08/19/2014  . Hyperlipidemia 08/19/2014  . Prediabetes 08/19/2014  . Bruising 09/26/2013  . History of basal cell cancer 09/26/2013  . Oral candidiasis 01/10/2013  . Essential hypertension 12/21/2011  . COPD (chronic obstructive pulmonary disease) ([place]) 01/20/2011  . Constipation 06/24/2010  . ABDOMINAL BLOATING 06/24/2010  . ATHEROSCLEROSIS OF AORTA 02/10/2010  . FATTY LIVER DISEASE 02/10/2010  . Anxiety state 05/14/2008  . Former smoker 12/28/2006  . VENOUS INSUFFICIENCY 12/28/2006    Past Surgical History:  Procedure Laterality Date  . cataract    . NECK SURGERY    . no colonoscopy     "afraid to "; [facility] reviewed  . VESICOVAGINAL FISTULA CLOSURE W/ TAH       OB History    No data available       Home Medications    Prior to Admission medications   Medication Sig Start Date End Date Taking? Authorizing Provider  acetaminophen (TYLENOL) 325 MG tablet Take 650 mg by mouth every 6 (six) hours as needed.    [provider]  albuterol (PROVENTIL HFA;VENTOLIN HFA) 108 (90 Base) MCG/ACT inhaler Inhale 2 puffs into the lungs every 4 (four) hours as needed for wheezing or shortness of breath. 12/17/16   Golden Circle, FNP  Azelastine-Fluticasone 137-50 MCG/ACT SUSP Place 2 sprays into the nose once. 10/14/16 11/15/17  Collene Gobble, MD  budesonide-formoterol (SYMBICORT) 160-4.5 MCG/ACT inhaler Inhale 2 puffs into the lungs 2 (two) times daily. 05/20/17   Collene Gobble, MD  diltiazem (CARDIZEM SR) 90 MG 12 hr capsule TAKE 1 CAPSULE BY MOUTH DAILY 09/15/16   Golden Circle, FNP  doxycycline (VIBRAMYCIN) 100 MG capsule Take 1 capsule (100 mg total) by mouth 2 (two) times daily for 10 days. 07/02/17 07/12/17  Tereasa Coop, PA-C  hydrochlorothiazide (MICROZIDE) 12.5 MG capsule TAKE 1 CAPSULE (12.5 MG TOTAL) BY MOUTH DAILY. 02/15/17   Golden Circle, FNP  potassium chloride SA (K-DUR,KLOR-CON) 20 MEQ tablet Take 1 tablet (20 mEq total) by mouth daily. 07/23/16   Jerline Pain, MD  predniSONE (DELTASONE) 20 MG tablet Take 1 tablet (20 mg total) by mouth daily with breakfast. 07/02/17   Tereasa Coop,  PA-C    Family History Family History  Problem Relation Age of Onset  . Diabetes Mother   . Emphysema Father   . Asthma Father   . Heart disease Father     Social History Social History   Tobacco Use  . Smoking status: Former Smoker    Packs/day: 0.75    Years: 15.00    Pack years: 11.25    Types: Cigarettes    Last attempt to quit: 03/20/2014    Years since quitting: 3.3  . Smokeless tobacco: Former Systems developer    Quit date: 03/11/2014  Substance Use Topics  . Alcohol use: No    Alcohol/week: 0.0 oz  . Drug use: No      Allergies   Patient has no known allergies.   Review of Systems Review of Systems  Constitutional: Negative for fever.  Respiratory: Positive for cough and shortness of breath.   All other systems reviewed and are negative.    Physical Exam Updated Vital Signs BP 139/88 (BP Location: Left Arm)   Pulse 72   Temp 98.2 F (36.8 C) (Oral)   Resp 20   LMP  (LMP Unknown)   SpO2 98%   Physical Exam  Constitutional: She appears well-developed and well-nourished. No distress.  HENT:  Head: Normocephalic and atraumatic.  Eyes: Conjunctivae are normal.  Neck: Neck supple.  Cardiovascular: Normal rate and regular rhythm.  No murmur heard. Pulmonary/Chest: Effort normal. No respiratory distress. She has wheezes.  Abdominal: Soft. There is no tenderness.  Musculoskeletal: Normal range of motion. She exhibits no edema.  Neurological: She is alert.  Skin: Skin is warm and dry.  Psychiatric: She has a normal mood and affect.  Nursing note and vitals reviewed.    ED Treatments / Results  Labs (all labs ordered are listed, but only abnormal results are displayed) Labs Reviewed  CBC WITH DIFFERENTIAL/PLATELET - Abnormal; Notable for the following components:      Result Value   WBC 15.2 (*)    Neutro Abs 11.9 (*)    All other components within normal limits  COMPREHENSIVE METABOLIC PANEL    EKG  EKG Interpretation None       Radiology No results found.  Procedures Procedures (including critical care time)  Medications Ordered in ED Medications - No data to display   Initial Impression / Assessment and Plan / ED Course  I have reviewed the triage vital signs and the nursing notes.  Pertinent labs & imaging results that were available during my care of the patient were reviewed by me and considered in my medical decision making (see chart for details).     MDM Chest xray reviewed.  Pt probably has pneumonia.  Pt is better after breathing treatment x 2 .   Pt has elevated BUN and creatine.  I ordered Iv fluid bolus, Rocephin and Zithromax  Final Clinical Impressions(s) / ED Diagnoses   Final diagnoses:  Community acquired pneumonia, unspecified laterality    ED Discharge Orders    None    I spoke to the Hospitalist  Dr. Carmelina Noun who will see for admission.    Sidney Ace 07/11/17 1604    Lacretia Leigh, MD 07/11/17 1620

## 2017-07-12 LAB — CBC
HEMATOCRIT: 36.8 % (ref 36.0–46.0)
HEMOGLOBIN: 12.1 g/dL (ref 12.0–15.0)
MCH: 29.7 pg (ref 26.0–34.0)
MCHC: 32.9 g/dL (ref 30.0–36.0)
MCV: 90.2 fL (ref 78.0–100.0)
Platelets: 308 10*3/uL (ref 150–400)
RBC: 4.08 MIL/uL (ref 3.87–5.11)
RDW: 14 % (ref 11.5–15.5)
WBC: 8.7 10*3/uL (ref 4.0–10.5)

## 2017-07-12 LAB — RESPIRATORY PANEL BY PCR
Adenovirus: NOT DETECTED
Bordetella pertussis: NOT DETECTED
CHLAMYDOPHILA PNEUMONIAE-RVPPCR: NOT DETECTED
CORONAVIRUS HKU1-RVPPCR: NOT DETECTED
CORONAVIRUS NL63-RVPPCR: NOT DETECTED
Coronavirus 229E: NOT DETECTED
Coronavirus OC43: NOT DETECTED
INFLUENZA A-RVPPCR: NOT DETECTED
Influenza B: NOT DETECTED
MYCOPLASMA PNEUMONIAE-RVPPCR: NOT DETECTED
Metapneumovirus: NOT DETECTED
Parainfluenza Virus 1: NOT DETECTED
Parainfluenza Virus 2: NOT DETECTED
Parainfluenza Virus 3: NOT DETECTED
Parainfluenza Virus 4: NOT DETECTED
Respiratory Syncytial Virus: NOT DETECTED
Rhinovirus / Enterovirus: NOT DETECTED

## 2017-07-12 LAB — BASIC METABOLIC PANEL
ANION GAP: 10 (ref 5–15)
BUN: 29 mg/dL — ABNORMAL HIGH (ref 6–20)
CALCIUM: 8.8 mg/dL — AB (ref 8.9–10.3)
CO2: 25 mmol/L (ref 22–32)
Chloride: 100 mmol/L — ABNORMAL LOW (ref 101–111)
Creatinine, Ser: 0.96 mg/dL (ref 0.44–1.00)
GFR calc Af Amer: >60 mL/min (ref >60)
GFR calc non Af Amer: 58 mL/min — ABNORMAL LOW (ref >60)
GLUCOSE: 180 mg/dL — AB (ref 65–99)
POTASSIUM: 3.5 mmol/L (ref 3.5–5.1)
Sodium: 135 mmol/L (ref 135–145)

## 2017-07-12 LAB — STREP PNEUMONIAE URINARY ANTIGEN: Strep Pneumo Urinary Antigen: NEGATIVE

## 2017-07-12 LAB — HIV ANTIBODY (ROUTINE TESTING W REFLEX): HIV SCREEN 4TH GENERATION: NONREACTIVE

## 2017-07-12 MED ORDER — ALBUTEROL SULFATE (2.5 MG/3ML) 0.083% IN NEBU: 2.5000 mg | INHALATION_SOLUTION | RESPIRATORY_TRACT | Status: DC | PRN

## 2017-07-12 MED ORDER — IBUPROFEN 200 MG PO TABS
400.0000 mg | ORAL_TABLET | Freq: Three times a day (TID) | ORAL | Status: DC
  Administered 2017-07-12 (×3): 400 mg via ORAL
  Filled 2017-07-12 (×3): qty 2

## 2017-07-12 MED ORDER — DM-GUAIFENESIN ER 30-600 MG PO TB12
2.0000 | ORAL_TABLET | Freq: Two times a day (BID) | ORAL | Status: DC
  Administered 2017-07-12 – 2017-07-15 (×7): 2 via ORAL
  Administered 2017-07-16: 1 via ORAL
  Filled 2017-07-12 (×2): qty 1
  Filled 2017-07-12 (×5): qty 2
  Filled 2017-07-12: qty 1
  Filled 2017-07-12: qty 2

## 2017-07-12 MED ORDER — POLYETHYLENE GLYCOL 3350 17 G PO PACK
17.0000 g | PACK | Freq: Two times a day (BID) | ORAL | Status: DC
  Administered 2017-07-12 – 2017-07-15 (×7): 17 g via ORAL
  Filled 2017-07-12 (×8): qty 1

## 2017-07-12 MED ORDER — SENNOSIDES-DOCUSATE SODIUM 8.6-50 MG PO TABS
1.0000 | ORAL_TABLET | Freq: Two times a day (BID) | ORAL | Status: DC
  Administered 2017-07-12 – 2017-07-16 (×9): 1 via ORAL
  Filled 2017-07-12 (×9): qty 1

## 2017-07-12 MED ORDER — LORATADINE 10 MG PO TABS
10.0000 mg | ORAL_TABLET | Freq: Every day | ORAL | Status: DC
  Administered 2017-07-12 – 2017-07-16 (×5): 10 mg via ORAL
  Filled 2017-07-12 (×5): qty 1

## 2017-07-12 MED ORDER — POTASSIUM CHLORIDE CRYS ER 20 MEQ PO TBCR
40.0000 meq | EXTENDED_RELEASE_TABLET | Freq: Once | ORAL | Status: AC
  Administered 2017-07-12: 40 meq via ORAL
  Filled 2017-07-12: qty 2

## 2017-07-12 MED ORDER — PANTOPRAZOLE SODIUM 40 MG PO TBEC
40.0000 mg | DELAYED_RELEASE_TABLET | Freq: Every day | ORAL | Status: DC
  Administered 2017-07-12 – 2017-07-16 (×5): 40 mg via ORAL
  Filled 2017-07-12 (×5): qty 1

## 2017-07-12 MED ORDER — IPRATROPIUM-ALBUTEROL 0.5-2.5 (3) MG/3ML IN SOLN
3.0000 mL | Freq: Three times a day (TID) | RESPIRATORY_TRACT | Status: DC
  Administered 2017-07-12 – 2017-07-13 (×5): 3 mL via RESPIRATORY_TRACT
  Filled 2017-07-12 (×5): qty 3

## 2017-07-12 NOTE — Care Management Note (Signed)
Case Management Note  Patient Details  Name: Diane Patterson MRN: [id number] Date of Birth: 05/23/1946  Subjective/Objective:                  Sob/whezzing copd exacerbation  Action/Plan: Date: July 12, 2017 Velva Harman, BSN, [place], [place] Chart and notes review for patient progress and needs. Will follow for case management and discharge needs. No cm or discharge needs present at time of this review. Next review date: [id number]  Expected Discharge Date:  (unknown)               Expected Discharge Plan:  Home/Self Care  In-House Referral:     Discharge planning Services  CM Consult  Post Acute Care Choice:    Choice offered to:     DME Arranged:    DME Agency:     HH Arranged:    HH Agency:     Status of Service:  In process, will continue to follow  If discussed at Long Length of Stay Meetings, dates discussed:    Additional Comments:  Leeroy Cha, RN 07/12/2017, 10:33 AM

## 2017-07-12 NOTE — Progress Notes (Signed)
PROGRESS NOTE    Diane Patterson  WSF:[id number] DOB: [birth year] DOA: 07/11/2017 PCP: Patient, No Pcp Per   Brief Narrative:  Patient is a 72 year old female history of COPD/asthma, hypertension presented to the ED with complaints of productive cough, shortness of breath, increased wheezing and generalized malaise.  Symptomatic present for a week treated on oral doxycycline and steroid taper however no significant improvement and patient presented to the ED.  Chest x-ray worrisome for pneumonia.  Patient noted to be tachypneic, wheezing with increased O2 requirements.  Patient also noted to be in acute kidney injury.  Patient admitted and placed empirically on IV antibiotics, IV steroid taper and nebs.   Assessment & Plan:   Principal Problem:   CAP (community acquired pneumonia) Active Problems:   Constipation   COPD with acute exacerbation ([place])   Essential hypertension   Hyperlipidemia   AKI (acute kidney injury) ([place])  1-community-acquired pneumonia/acute COPD exacerbation.   Patient had presented with acute respiratory symptoms of hypoxia, increased O2 requirements, cough, wheezing.  Chest x-ray done on admission was worrisome for community-acquired pneumonia.  Patient with clinical improvement.  Continue empiric IV Rocephin and azithromycin, steroid taper, Pulmicort, scheduled duo nebs.  Increase Mucinex to 1200 mg twice daily.  IV fluids.  Supportive care.   2-Essential HTN -Currently stable.  HCTZ on hold secondary to acute kidney injury.  Continue Cardizem.  Follow.   3-Constipation -Increase MiraLAX to 17 g twice daily.  Place on Senokot S2 twice daily.  Follow.   4-Hyperlipidemia -continue statins   5-AKI (acute kidney injury) ([place]) -Likely secondary to a prerenal azotemia.  Improving with hydration.  Avoid nephrotoxins.  Follow.   6-rhinitis/nasal congestion -continue home Astelin/flonase  7-back pain /weakness likely musculoskeletal -Patient with  complaints of back pain more in the sacral region which was reproducible with palpation.  Patient was started on scheduled ibuprofen, monitor renal function closely.  Warm compresses.  Tylenol as needed.  Robaxin as needed.  PT/OT.      DVT prophylaxis: Heparin Code Status: Full Family Communication: Updated patient.  No family at bedside. Disposition Plan: Home when medically stable in the next 24-48 hours.   Consultants:   None  Procedures:   Chest x-ray 07/11/2017    Antimicrobials:   IV azithromycin 07/11/2017  IV Rocephin 07/11/2017   Subjective: Still with some wheezing.  Patient states slight improvement with shortness of breath however not at baseline.  No chest pain.  Objective: Vitals:   07/11/17 2236 07/12/17 0530 07/12/17 0821 07/12/17 1000  BP: (!) 162/67 (!) 158/69    Pulse: 90 89    Resp: 20 20    Temp: 98.1 F (36.7 C) 98.5 F (36.9 C)    TempSrc: Oral Oral    SpO2: 98% 98% (!) 88%   Weight:    64.9 kg (143 lb)    Intake/Output Summary (Last 24 hours) at 07/12/2017 1026 Last data filed at 07/12/2017 0750 Gross per 24 hour  Intake 2308.75 ml  Output 1200 ml  Net 1108.75 ml   Filed Weights   07/12/17 1000  Weight: 64.9 kg (143 lb)    Examination:  General exam: Appears calm and comfortable  Respiratory system: Some coarse breath sounds.  Minimal to mild expiratory wheezing.  No crackles.  Respiratory effort normal. Cardiovascular system: S1 & S2 heard, RRR. No JVD, murmurs, rubs, gallops or clicks. No pedal edema. Gastrointestinal system: Abdomen is nondistended, soft and nontender. No organomegaly or masses felt. Normal bowel sounds heard. Central  nervous system: Alert and oriented. No focal neurological deficits. Extremities: Symmetric 5 x 5 power. Skin: No rashes, lesions or ulcers Psychiatry: Judgement and insight appear normal. Mood & affect appropriate.     Data Reviewed: I have personally reviewed following labs and imaging  studies  CBC: Recent Labs  Lab 07/11/17 1411 07/11/17 1932 07/12/17 0605  WBC 15.2* 12.9* 8.7  NEUTROABS 11.9*  --   --   HGB 13.5 12.8 12.1  HCT 40.7 38.0 36.8  MCV 89.8 90.3 90.2  PLT 339 315 706   Basic Metabolic Panel: Recent Labs  Lab 07/11/17 1411 07/11/17 1932 07/12/17 0605  NA 135  --  135  K 3.3*  --  3.5  CL 94*  --  100*  CO2 27  --  25  GLUCOSE 153*  --  180*  BUN 34*  --  29*  CREATININE 1.04* 1.13* 0.96  CALCIUM 9.2  --  8.8*  MG  --  1.9  --    GFR: Estimated Creatinine Clearance: 52.5 mL/min (by C-G formula based on SCr of 0.96 mg/dL). Liver Function Tests: Recent Labs  Lab 07/11/17 1411  AST 24  ALT 25  ALKPHOS 77  BILITOT 1.0  PROT 6.9  ALBUMIN 3.9   No results for input(s): LIPASE, AMYLASE in the last 168 hours. No results for input(s): AMMONIA in the last 168 hours. Coagulation Profile: No results for input(s): INR, PROTIME in the last 168 hours. Cardiac Enzymes: No results for input(s): CKTOTAL, CKMB, CKMBINDEX, TROPONINI in the last 168 hours. BNP (last 3 results) No results for input(s): PROBNP in the last 8760 hours. HbA1C: No results for input(s): HGBA1C in the last 72 hours. CBG: No results for input(s): GLUCAP in the last 168 hours. Lipid Profile: No results for input(s): CHOL, HDL, LDLCALC, TRIG, CHOLHDL, LDLDIRECT in the last 72 hours. Thyroid Function Tests: No results for input(s): TSH, T4TOTAL, FREET4, T3FREE, THYROIDAB in the last 72 hours. Anemia Panel: No results for input(s): VITAMINB12, FOLATE, FERRITIN, TIBC, IRON, RETICCTPCT in the last 72 hours. Sepsis Labs: No results for input(s): PROCALCITON, LATICACIDVEN in the last 168 hours.  Recent Results (from the past 240 hour(s))  Culture, blood (routine x 2) Call MD if unable to obtain prior to antibiotics being given     Status: None (Preliminary result)   Collection Time: 07/11/17  7:32 PM  Result Value Ref Range Status   Specimen Description BLOOD RIGHT HAND   Final   Special Requests IN PEDIATRIC BOTTLE Blood Culture adequate volume  Final   Culture   Final    NO GROWTH < 12 HOURS Performed at [facility], [address], [place], [place] [address]    Report Status PENDING  Incomplete  Culture, blood (routine x 2) Call MD if unable to obtain prior to antibiotics being given     Status: None (Preliminary result)   Collection Time: 07/11/17  7:32 PM  Result Value Ref Range Status   Specimen Description BLOOD RIGHT ANTECUBITAL  Final   Special Requests   Final    BOTTLES DRAWN AEROBIC ONLY Blood Culture adequate volume   Culture   Final    NO GROWTH < 12 HOURS Performed at [facility], [address], [place], [place] [address]    Report Status PENDING  Incomplete  Respiratory Panel by PCR     Status: None   Collection Time: 07/11/17  7:34 PM  Result Value Ref Range Status   Adenovirus  NOT DETECTED NOT DETECTED Final   Coronavirus 229E NOT DETECTED NOT DETECTED Final   Coronavirus HKU1 NOT DETECTED NOT DETECTED Final   Coronavirus NL63 NOT DETECTED NOT DETECTED Final   Coronavirus OC43 NOT DETECTED NOT DETECTED Final   Metapneumovirus NOT DETECTED NOT DETECTED Final   Rhinovirus / Enterovirus NOT DETECTED NOT DETECTED Final   Influenza A NOT DETECTED NOT DETECTED Final   Influenza B NOT DETECTED NOT DETECTED Final   Parainfluenza Virus 1 NOT DETECTED NOT DETECTED Final   Parainfluenza Virus 2 NOT DETECTED NOT DETECTED Final   Parainfluenza Virus 3 NOT DETECTED NOT DETECTED Final   Parainfluenza Virus 4 NOT DETECTED NOT DETECTED Final   Respiratory Syncytial Virus NOT DETECTED NOT DETECTED Final   Bordetella pertussis NOT DETECTED NOT DETECTED Final   Chlamydophila pneumoniae NOT DETECTED NOT DETECTED Final   Mycoplasma pneumoniae NOT DETECTED NOT DETECTED Final    Comment: Performed at [facility], [place] [address] [place], [place] [address]         Radiology Studies: Dg Chest 2 View  Result Date:  07/11/2017 CLINICAL DATA:  Shortness of breath, nausea. Recently diagnosed with flu and exacerbation of COPD. Patient also reports back pain for the past 2 days since rolling over wrong in bed. Former smoker. EXAM: CHEST  2 VIEW COMPARISON:  Chest x-ray of June 16, 2015. FINDINGS: The lungs are hyperinflated with hemidiaphragm flattening. The lung markings are coarse in the lower lobes. There new increased density in the retrocardiac region. The heart and pulmonary vascularity are normal. There calcification in the wall of the thoracic aorta. The bony thorax exhibits no acute abnormality. IMPRESSION: Increased retrocardiac density likely on the left is compatible with atelectasis or pneumonia. There is underlying COPD. Followup PA and lateral chest X-ray is recommended in 3-4 weeks following trial of antibiotic therapy to ensure resolution and exclude underlying malignancy. Thoracic aortic atherosclerosis. Electronically Signed   By: David  Martinique M.D.   On: 07/11/2017 14:38        Scheduled Meds: . azelastine  2 spray Each Nare Daily   And  . fluticasone  2 spray Each Nare Daily  . budesonide (PULMICORT) nebulizer solution  0.5 mg Nebulization BID  . dextromethorphan-guaiFENesin  2 tablet Oral BID  . diltiazem  90 mg Oral Daily  . heparin  5,000 Units Subcutaneous Q8H  . ibuprofen  400 mg Oral TID  . ipratropium-albuterol  3 mL Nebulization TID  . loratadine  10 mg Oral Daily  . methylPREDNISolone (SOLU-MEDROL) injection  40 mg Intravenous Q12H  . pantoprazole  40 mg Oral Q0600  . polyethylene glycol  17 g Oral BID  . senna-docusate  1 tablet Oral BID   Continuous Infusions: . sodium chloride 75 mL/hr at 07/12/17 0818  . azithromycin    . cefTRIAXone (ROCEPHIN)  IV       LOS: 1 day    Time spent: 35 minutes    Irine Seal, MD [facility] Pager [phone]  If 7PM-7AM, please contact night-coverage [email or web address] Password [facility] 07/12/2017, 10:26 AM

## 2017-07-12 NOTE — Progress Notes (Signed)
RT NOTE:  Flutter given. Education provided. Pt preformed x10 with good effort. Instructed to use 10x each hour.

## 2017-07-12 NOTE — Evaluation (Signed)
Physical Therapy Evaluation Patient Details Name: Diane Patterson MRN: [id number] DOB: 07/30/1945 Today's Date: 07/12/2017   History of Present Illness  Diane Patterson is a 72 y/o woman with PMH significant for COPD/asthma and HTN; who presented to ED with complaints of productive cough, SOB, increase wheezing and general malaise.  Clinical Impression  Patient presents with decreased independence with mobility due to weakness and limited activity tolerance.  Currently min A for transfers and ambulation with RW.  At home was independent, but limited since illness with walking.  SpO2 on RA dropped to 87% with ambulation, up to 91% with seated rest.  Will need follow up HHPT at d/c and PT to follow acutely.    Follow Up Recommendations Home health PT    Equipment Recommendations  Rolling walker with 5" wheels    Recommendations for Other Services       Precautions / Restrictions Precautions Precautions: Fall Precaution Comments: watch O2      Mobility  Bed Mobility Overal bed mobility: Needs Assistance Bed Mobility: Supine to Sit     Supine to sit: Supervision     General bed mobility comments: increased time  Transfers Overall transfer level: Needs assistance Equipment used: Rolling walker (2 wheeled) Transfers: Sit to/from Stand Sit to Stand: Min assist;From elevated surface         General transfer comment: up from EOB then from desck chair wihtout armrests, then x5 from bed with cues for hand placement  Ambulation/Gait Ambulation/Gait assistance: Min guard;Supervision Ambulation Distance (Feet): 75 Feet(x 2) Assistive device: Rolling walker (2 wheeled) Gait Pattern/deviations: Step-through pattern;Trunk flexed     General Gait Details: assist for balance, steadying and cues for walker safety  Stairs            Wheelchair Mobility    Modified Rankin (Stroke Patients Only)       Balance Overall balance assessment: Needs  assistance Sitting-balance support: Feet supported Sitting balance-Leahy Scale: Good     Standing balance support: No upper extremity supported Standing balance-Leahy Scale: Fair                               Pertinent Vitals/Pain Pain Assessment: 0-10 Pain Score: 7  Pain Location: lumbar area into hips Pain Descriptors / Indicators: Aching;Sore Pain Intervention(s): Monitored during session;Repositioned;Heat applied    Home Living Family/patient expects to be discharged to:: Private residence Living Arrangements: Spouse/significant other Available Help at Discharge: Family Type of Home: House Home Access: Stairs to enter   Technical brewer of Steps: 1 small step Home Layout: Other (Comment)(2 steps to wash room) Home Equipment: Shower seat - built in;Grab bars - tub/shower;Hand held shower head      Prior Function                 Hand Dominance        Extremity/Trunk Assessment   Upper Extremity Assessment Upper Extremity Assessment: Generalized weakness    Lower Extremity Assessment Lower Extremity Assessment: Generalized weakness       Communication   Communication: No difficulties  Cognition Arousal/Alertness: Awake/alert Behavior During Therapy: WFL for tasks assessed/performed Overall Cognitive Status: Within Functional Limits for tasks assessed                                        General Comments  Exercises     Assessment/Plan    PT Assessment Patient needs continued PT services  PT Problem List Decreased strength;Decreased mobility;Decreased safety awareness;Decreased knowledge of precautions;Decreased knowledge of use of DME;Decreased balance;Decreased activity tolerance       PT Treatment Interventions DME instruction;Functional mobility training;Balance training;Patient/family education;Gait training;Stair training;Therapeutic activities;Therapeutic exercise    PT Goals (Current goals can  be found in the Care Plan section)  Acute Rehab PT Goals Patient Stated Goal: to get home when well PT Goal Formulation: With patient Time For Goal Achievement: 07/16/17 Potential to Achieve Goals: Good    Frequency Min 3X/week   Barriers to discharge Decreased caregiver support states spouse is sick too    Co-evaluation               AM-PAC PT "6 Clicks" Daily Activity  Outcome Measure Difficulty turning over in bed (including adjusting bedclothes, sheets and blankets)?: None Difficulty moving from lying on back to sitting on the side of the bed? : A Little Difficulty sitting down on and standing up from a chair with arms (e.g., wheelchair, bedside commode, etc,.)?: A Little Help needed moving to and from a bed to chair (including a wheelchair)?: A Little Help needed walking in hospital room?: A Little Help needed climbing 3-5 steps with a railing? : A Lot 6 Click Score: 18    End of Session Equipment Utilized During Treatment: Gait belt Activity Tolerance: Patient tolerated treatment well Patient left: in chair;with call bell/phone within reach   PT Visit Diagnosis: Other abnormalities of gait and mobility (R26.89);Unsteadiness on feet (R26.81);Muscle weakness (generalized) (M62.81)    Time: 4268-3419 PT Time Calculation (min) (ACUTE ONLY): 27 min   Charges:   PT Evaluation $PT Eval Moderate Complexity: 1 Mod PT Treatments $Gait Training: 8-22 mins   PT G CodesMagda Kiel, [place] [phone] 07/12/2017   Reginia Naas 07/12/2017, 3:25 PM

## 2017-07-13 DIAGNOSIS — N179 Acute kidney failure, unspecified: Secondary | ICD-10-CM

## 2017-07-13 DIAGNOSIS — I10 Essential (primary) hypertension: Secondary | ICD-10-CM

## 2017-07-13 DIAGNOSIS — J441 Chronic obstructive pulmonary disease with (acute) exacerbation: Principal | ICD-10-CM

## 2017-07-13 LAB — CBC WITH DIFFERENTIAL/PLATELET
BASOS ABS: 0 10*3/uL (ref 0.0–0.1)
Basophils Relative: 0 %
EOS PCT: 0 %
Eosinophils Absolute: 0 10*3/uL (ref 0.0–0.7)
HEMATOCRIT: 36.7 % (ref 36.0–46.0)
Hemoglobin: 11.8 g/dL — ABNORMAL LOW (ref 12.0–15.0)
LYMPHS ABS: 1.3 10*3/uL (ref 0.7–4.0)
LYMPHS PCT: 5 %
MCH: 29.8 pg (ref 26.0–34.0)
MCHC: 32.2 g/dL (ref 30.0–36.0)
MCV: 92.7 fL (ref 78.0–100.0)
MONO ABS: 1 10*3/uL (ref 0.1–1.0)
Monocytes Relative: 4 %
NEUTROS ABS: 21.1 10*3/uL — AB (ref 1.7–7.7)
Neutrophils Relative %: 91 %
Platelets: 322 10*3/uL (ref 150–400)
RBC: 3.96 MIL/uL (ref 3.87–5.11)
RDW: 14.2 % (ref 11.5–15.5)
WBC: 23.4 10*3/uL — ABNORMAL HIGH (ref 4.0–10.5)

## 2017-07-13 LAB — BASIC METABOLIC PANEL
Anion gap: 8 (ref 5–15)
BUN: 30 mg/dL — AB (ref 6–20)
CO2: 24 mmol/L (ref 22–32)
Calcium: 8.8 mg/dL — ABNORMAL LOW (ref 8.9–10.3)
Chloride: 105 mmol/L (ref 101–111)
Creatinine, Ser: 0.98 mg/dL (ref 0.44–1.00)
GFR calc Af Amer: >60 mL/min (ref >60)
GFR calc non Af Amer: 56 mL/min — ABNORMAL LOW (ref >60)
GLUCOSE: 212 mg/dL — AB (ref 65–99)
Potassium: 4.3 mmol/L (ref 3.5–5.1)
Sodium: 137 mmol/L (ref 135–145)

## 2017-07-13 LAB — LEGIONELLA PNEUMOPHILA SEROGP 1 UR AG: L. pneumophila Serogp 1 Ur Ag: NEGATIVE

## 2017-07-13 MED ORDER — IPRATROPIUM-ALBUTEROL 0.5-2.5 (3) MG/3ML IN SOLN
3.0000 mL | Freq: Four times a day (QID) | RESPIRATORY_TRACT | Status: DC
  Administered 2017-07-13 – 2017-07-14 (×5): 3 mL via RESPIRATORY_TRACT
  Filled 2017-07-13 (×5): qty 3

## 2017-07-13 MED ORDER — IBUPROFEN 200 MG PO TABS
400.0000 mg | ORAL_TABLET | Freq: Four times a day (QID) | ORAL | Status: DC | PRN
  Administered 2017-07-13 – 2017-07-15 (×4): 400 mg via ORAL
  Filled 2017-07-13 (×4): qty 2

## 2017-07-13 NOTE — Progress Notes (Signed)
Physical Therapy Treatment Patient Details Name: Diane Patterson MRN: [id number] DOB: [birth year] Today's Date: 07/13/2017    History of Present Illness JAEDEN Patterson is a 72 y/o woman with PMH significant for COPD/asthma and HTN; who presented to ED with complaints of productive cough, SOB, increase wheezing and general malaise.    PT Comments    Patient progressing to practice stairs this session and ambulated further distance.  Dyspnea level increased, however and pt wheezing with SpO2 87% on 2L with activity.  RT called to assist with tx.  Will continue skilled PT until d/c.    Follow Up Recommendations  Home health PT     Equipment Recommendations  Rolling walker with 5" wheels    Recommendations for Other Services       Precautions / Restrictions Precautions Precautions: Fall Precaution Comments: watch O2    Mobility  Bed Mobility Overal bed mobility: Needs Assistance Bed Mobility: Supine to Sit     Supine to sit: Supervision;HOB elevated        Transfers Overall transfer level: Needs assistance   Transfers: Sit to/from Stand Sit to Stand: Min guard         General transfer comment: cues for hand placement  Ambulation/Gait Ambulation/Gait assistance: Min guard;Supervision Ambulation Distance (Feet): 120 Feet Assistive device: Rolling walker (2 wheeled) Gait Pattern/deviations: Step-through pattern;Decreased stride length;Trunk flexed         Stairs Stairs: Yes   Stair Management: One rail Right(HHA) Number of Stairs: 2 General stair comments: educated on technique for home entry, stairs steeper than hers, needed wall and HHA, spouse to assist at home  Wheelchair Mobility    Modified Rankin (Stroke Patients Only)       Balance   Sitting-balance support: Feet supported Sitting balance-Leahy Scale: Good     Standing balance support: No upper extremity supported Standing balance-Leahy Scale: Fair                               Cognition Arousal/Alertness: Awake/alert Behavior During Therapy: WFL for tasks assessed/performed Overall Cognitive Status: Within Functional Limits for tasks assessed                                        Exercises      General Comments General comments (skin integrity, edema, etc.): SpO2 87% after ambulation on 2L O2 and wheezing; Resp therapist notified; back to 91% with seated rest x 30 sec      Pertinent Vitals/Pain Pain Assessment: Faces Faces Pain Scale: Hurts little more Pain Location: back Pain Descriptors / Indicators: Aching;Sore Pain Intervention(s): Monitored during session    Home Living                      Prior Function            PT Goals (current goals can now be found in the care plan section) Progress towards PT goals: Progressing toward goals    Frequency    Min 3X/week      PT Plan Current plan remains appropriate    Co-evaluation              AM-PAC PT "6 Clicks" Daily Activity  Outcome Measure  Difficulty turning over in bed (including adjusting bedclothes, sheets and blankets)?: None Difficulty moving from lying on back to sitting  on the side of the bed? : A Little Difficulty sitting down on and standing up from a chair with arms (e.g., wheelchair, bedside commode, etc,.)?: Unable Help needed moving to and from a bed to chair (including a wheelchair)?: A Little Help needed walking in hospital room?: A Little Help needed climbing 3-5 steps with a railing? : A Lot 6 Click Score: 16    End of Session Equipment Utilized During Treatment: Gait belt;Oxygen Activity Tolerance: Patient tolerated treatment well Patient left: in chair;with call bell/phone within reach   PT Visit Diagnosis: Other abnormalities of gait and mobility (R26.89);Unsteadiness on feet (R26.81);Muscle weakness (generalized) (M62.81)     Time: 7078-6754 PT Time Calculation (min) (ACUTE ONLY): 21 min  Charges:  $Gait  Training: 8-22 mins                    G CodesMagda Kiel, [place] 07/13/2017    Reginia Naas 07/13/2017, 4:36 PM

## 2017-07-13 NOTE — Progress Notes (Signed)
PROGRESS NOTE        PATIENT DETAILS Name: Diane Patterson Age: 73 y.o. Sex: female Date of Birth: 1946-04-20 Admit Date: 07/11/2017 Admitting Physician Barton Dubois, MD [id number], No Pcp Per  Brief Narrative: Patient is a 72 y.o. female with history of COPD-chronic hypoxic respiratory failure on nocturnal O2-admitted to the hospitalist service for COPD exacerbation and pneumonia-slowly improving with steroids, bronchodilators and empiric antimicrobial therapy.  See below for further details.  Subjective: Much improved-not yet back to her baseline.  Still coughing.  Assessment/Plan: COPD exacerbation: Improving-not yet back to her baseline-continue bronchodilators, steroids and other supportive care.  Community acquired pneumonia: Improved-afebrile-continues to cough-continue empiric antimicrobial therapy.  Blood cultures continue to be negative, influenza PCR respiratory virus panel negative as well.  HIV nonreactive.  Leukocytosis: Secondary to steroids-although has pneumonia-she has improved clinically.  Acute kidney injury: Mild hemodynamically mediated kidney injury-resolved with supportive care.  Hypertension: Relatively well controlled-continue with Cardizem-resume HCTZ on discharge  Rhinitis/nasal congestion: Improving with antihistamines, Flonase and decongestant.  DVT Prophylaxis: Prophylactic Heparin   Code Status: Full code  Family Communication: None at bedside  Disposition Plan: Remain inpatient-hopefully home tomorrow morning.  Antimicrobial agents: Anti-infectives (From admission, onward)   Start     Dose/Rate Route Frequency Ordered Stop   07/12/17 1600  cefTRIAXone (ROCEPHIN) 1 g in dextrose 5 % 50 mL IVPB     1 g 100 mL/hr over 30 Minutes Intravenous Every 24 hours 07/11/17 1850 07/19/17 1559   07/12/17 1600  azithromycin (ZITHROMAX) 500 mg in dextrose 5 % 250 mL IVPB     500 mg 250 mL/hr over 60 Minutes Intravenous  Every 24 hours 07/11/17 1850 07/19/17 1559   07/11/17 1545  cefTRIAXone (ROCEPHIN) 1 g in dextrose 5 % 50 mL IVPB     1 g 100 mL/hr over 30 Minutes Intravenous  Once 07/11/17 1539 07/11/17 1626   07/11/17 1545  azithromycin (ZITHROMAX) 500 mg in dextrose 5 % 250 mL IVPB     500 mg 250 mL/hr over 60 Minutes Intravenous  Once 07/11/17 1540 07/11/17 1733   07/11/17 1530  levofloxacin (LEVAQUIN) tablet 500 mg  Status:  Discontinued     500 mg Oral  Once 07/11/17 1529 07/11/17 1538      Procedures: None  CONSULTS:  None  Time spent: 25 minutes-Greater than 50% of this time was spent in counseling, explanation of diagnosis, planning of further management, and coordination of care.  MEDICATIONS: Scheduled Meds: . azelastine  2 spray Each Nare Daily   And  . fluticasone  2 spray Each Nare Daily  . budesonide (PULMICORT) nebulizer solution  0.5 mg Nebulization BID  . dextromethorphan-guaiFENesin  2 tablet Oral BID  . diltiazem  90 mg Oral Daily  . heparin  5,000 Units Subcutaneous Q8H  . ipratropium-albuterol  3 mL Nebulization TID  . loratadine  10 mg Oral Daily  . methylPREDNISolone (SOLU-MEDROL) injection  40 mg Intravenous Q12H  . pantoprazole  40 mg Oral Q0600  . polyethylene glycol  17 g Oral BID  . senna-docusate  1 tablet Oral BID   Continuous Infusions: . sodium chloride 10 mL/hr at 07/13/17 0749  . azithromycin Stopped (07/12/17 1625)  . cefTRIAXone (ROCEPHIN)  IV Stopped (07/12/17 1720)   PRN Meds:.acetaminophen **OR** acetaminophen, albuterol, ibuprofen, methocarbamol, ondansetron **OR** ondansetron (ZOFRAN) IV   PHYSICAL EXAM: Vital  signs: Vitals:   07/13/17 0609 07/13/17 0830 07/13/17 0941 07/13/17 1415  BP: (!) 153/50 (!) 145/59    Pulse: 60 75    Resp:      Temp:  (!) 97.4 F (36.3 C)    TempSrc:  Oral    SpO2:  98% 96% 96%  Weight:       Filed Weights   07/12/17 1000  Weight: 64.9 kg (143 lb)   Body mass index is 22.07 kg/m.   General  appearance :Awake, alert, not in any distress.  Eyes:, pupils equally reactive to light and accomodation HEENT: Atraumatic and Normocephalic Neck: supple, no JVD. No cervical lymphadenopathy. Resp:Good air entry bilaterally, some scattered rhonchi CVS: S1 S2 regular, no murmurs.  GI: Bowel sounds present, Non tender and not distended with no gaurding, rigidity or rebound.No organomegaly Extremities: B/L Lower Ext shows no edema, both legs are warm to touch Neurology:  speech clear,Non focal, sensation is grossly intact. Psychiatric: Normal judgment and insight. Alert and oriented x 3. Normal mood. Musculoskeletal:No digital cyanosis Skin:No Rash, warm and dry Wounds:N/A  I have personally reviewed following labs and imaging studies  LABORATORY DATA: CBC: Recent Labs  Lab 07/11/17 1411 07/11/17 1932 07/12/17 0605 07/13/17 0615  WBC 15.2* 12.9* 8.7 23.4*  NEUTROABS 11.9*  --   --  21.1*  HGB 13.5 12.8 12.1 11.8*  HCT 40.7 38.0 36.8 36.7  MCV 89.8 90.3 90.2 92.7  PLT 339 315 308 865    Basic Metabolic Panel: Recent Labs  Lab 07/11/17 1411 07/11/17 1932 07/12/17 0605 07/13/17 0615  NA 135  --  135 137  K 3.3*  --  3.5 4.3  CL 94*  --  100* 105  CO2 27  --  25 24  GLUCOSE 153*  --  180* 212*  BUN 34*  --  29* 30*  CREATININE 1.04* 1.13* 0.96 0.98  CALCIUM 9.2  --  8.8* 8.8*  MG  --  1.9  --   --     GFR: Estimated Creatinine Clearance: 51.4 mL/min (by C-G formula based on SCr of 0.98 mg/dL).  Liver Function Tests: Recent Labs  Lab 07/11/17 1411  AST 24  ALT 25  ALKPHOS 77  BILITOT 1.0  PROT 6.9  ALBUMIN 3.9   No results for input(s): LIPASE, AMYLASE in the last 168 hours. No results for input(s): AMMONIA in the last 168 hours.  Coagulation Profile: No results for input(s): INR, PROTIME in the last 168 hours.  Cardiac Enzymes: No results for input(s): CKTOTAL, CKMB, CKMBINDEX, TROPONINI in the last 168 hours.  BNP (last 3 results) No results for  input(s): PROBNP in the last 8760 hours.  HbA1C: No results for input(s): HGBA1C in the last 72 hours.  CBG: No results for input(s): GLUCAP in the last 168 hours.  Lipid Profile: No results for input(s): CHOL, HDL, LDLCALC, TRIG, CHOLHDL, LDLDIRECT in the last 72 hours.  Thyroid Function Tests: No results for input(s): TSH, T4TOTAL, FREET4, T3FREE, THYROIDAB in the last 72 hours.  Anemia Panel: No results for input(s): VITAMINB12, FOLATE, FERRITIN, TIBC, IRON, RETICCTPCT in the last 72 hours.  Urine analysis:    Component Value Date/Time   COLORURINE LT. YELLOW 07/05/2011 1100   APPEARANCEUR CLEAR 07/05/2011 1100   LABSPEC 1.015 07/05/2011 1100   PHURINE 5.5 07/05/2011 1100   GLUCOSEU NEGATIVE 07/05/2011 1100   HGBUR TRACE-LYSED 07/05/2011 1100   BILIRUBINUR NEGATIVE 07/05/2011 1100   KETONESUR NEGATIVE 07/05/2011 1100   UROBILINOGEN 0.2 07/05/2011  [place] 07/05/2011 1100   LEUKOCYTESUR NEGATIVE 07/05/2011 1100    Sepsis Labs: Lactic Acid, Venous No results found for: LATICACIDVEN  MICROBIOLOGY: Recent Results (from the past 240 hour(s))  Culture, blood (routine x 2) Call MD if unable to obtain prior to antibiotics being given     Status: None (Preliminary result)   Collection Time: 07/11/17  7:32 PM  Result Value Ref Range Status   Specimen Description BLOOD RIGHT HAND  Final   Special Requests IN PEDIATRIC BOTTLE Blood Culture adequate volume  Final   Culture   Final    NO GROWTH 2 DAYS Performed at [facility] [address], [place], [place] [address]    Report Status PENDING  Incomplete  Culture, blood (routine x 2) Call MD if unable to obtain prior to antibiotics being given     Status: None (Preliminary result)   Collection Time: 07/11/17  7:32 PM  Result Value Ref Range Status   Specimen Description BLOOD RIGHT ANTECUBITAL  Final   Special Requests   Final    BOTTLES DRAWN AEROBIC ONLY Blood Culture adequate volume   Culture    Final    NO GROWTH 2 DAYS Performed at [facility], [address] [place], [place] [address]    Report Status PENDING  Incomplete  Respiratory Panel by PCR     Status: None   Collection Time: 07/11/17  7:34 PM  Result Value Ref Range Status   Adenovirus NOT DETECTED NOT DETECTED Final   Coronavirus 229E NOT DETECTED NOT DETECTED Final   Coronavirus HKU1 NOT DETECTED NOT DETECTED Final   Coronavirus NL63 NOT DETECTED NOT DETECTED Final   Coronavirus OC43 NOT DETECTED NOT DETECTED Final   Metapneumovirus NOT DETECTED NOT DETECTED Final   Rhinovirus / Enterovirus NOT DETECTED NOT DETECTED Final   Influenza A NOT DETECTED NOT DETECTED Final   Influenza B NOT DETECTED NOT DETECTED Final   Parainfluenza Virus 1 NOT DETECTED NOT DETECTED Final   Parainfluenza Virus 2 NOT DETECTED NOT DETECTED Final   Parainfluenza Virus 3 NOT DETECTED NOT DETECTED Final   Parainfluenza Virus 4 NOT DETECTED NOT DETECTED Final   Respiratory Syncytial Virus NOT DETECTED NOT DETECTED Final   Bordetella pertussis NOT DETECTED NOT DETECTED Final   Chlamydophila pneumoniae NOT DETECTED NOT DETECTED Final   Mycoplasma pneumoniae NOT DETECTED NOT DETECTED Final    Comment: Performed at [facility], [place] [address], [place], [place] [address]    RADIOLOGY STUDIES/RESULTS: Dg Chest 2 View  Result Date: 07/11/2017 CLINICAL DATA:  Shortness of breath, nausea. Recently diagnosed with flu and exacerbation of COPD. Patient also reports back pain for the past 2 days since rolling over wrong in bed. Former smoker. EXAM: CHEST  2 VIEW COMPARISON:  Chest x-ray of June 16, 2015. FINDINGS: The lungs are hyperinflated with hemidiaphragm flattening. The lung markings are coarse in the lower lobes. There new increased density in the retrocardiac region. The heart and pulmonary vascularity are normal. There calcification in the wall of the thoracic aorta. The bony thorax exhibits no acute abnormality. IMPRESSION:  Increased retrocardiac density likely on the left is compatible with atelectasis or pneumonia. There is underlying COPD. Followup PA and lateral chest X-ray is recommended in 3-4 weeks following trial of antibiotic therapy to ensure resolution and exclude underlying malignancy. Thoracic aortic atherosclerosis. Electronically Signed   By: David  Martinique M.D.   On: 07/11/2017 14:38  LOS: 2 days   Oren Binet, MD  [facility] Pager:[phone]  If 7PM-7AM, please contact night-coverage [email or web address] Password [email or web address] 07/13/2017, 2:20 PM

## 2017-07-14 DIAGNOSIS — J189 Pneumonia, unspecified organism: Secondary | ICD-10-CM

## 2017-07-14 MED ORDER — CEFUROXIME AXETIL 500 MG PO TABS
500.0000 mg | ORAL_TABLET | Freq: Two times a day (BID) | ORAL | Status: DC
  Administered 2017-07-14 – 2017-07-16 (×5): 500 mg via ORAL
  Filled 2017-07-14 (×7): qty 1

## 2017-07-14 MED ORDER — FUROSEMIDE 10 MG/ML IJ SOLN
40.0000 mg | Freq: Once | INTRAMUSCULAR | Status: AC
  Administered 2017-07-14: 40 mg via INTRAVENOUS
  Filled 2017-07-14: qty 4

## 2017-07-14 MED ORDER — IPRATROPIUM-ALBUTEROL 0.5-2.5 (3) MG/3ML IN SOLN
3.0000 mL | Freq: Three times a day (TID) | RESPIRATORY_TRACT | Status: DC
  Administered 2017-07-15: 3 mL via RESPIRATORY_TRACT
  Filled 2017-07-14: qty 3

## 2017-07-14 NOTE — Care Management Important Message (Signed)
Important Message  Patient Details  Name: MORRIGAN WICKENS MRN: [id number] Date of Birth: Oct 02, 1945   Medicare Important Message Given:  Yes    Kerin Salen 07/14/2017, [place] Message  Patient Details  Name: GALAXY BORDEN MRN: [id number] Date of Birth: [birth year]   Medicare Important Message Given:  Yes    Kerin Salen 07/14/2017, 11:22 AM

## 2017-07-14 NOTE — Progress Notes (Signed)
PROGRESS NOTE        PATIENT DETAILS Name: Diane Patterson Age: 72 y.o. Sex: female Date of Birth: 1946/05/29 Admit Date: 07/11/2017 Admitting Physician Barton Dubois, MD HCW:[id number], No Pcp Per  Brief Narrative: Patient is a 72 y.o. female with history of COPD-chronic hypoxic respiratory failure on nocturnal O2-admitted to the hospitalist service for COPD exacerbation and pneumonia-slowly improving with steroids, bronchodilators and empiric antimicrobial therapy.  See below for further details.  Subjective: Still wheezing-not yet back to her baseline-not ready for discharge but slowly improving.   Assessment/Plan: COPD exacerbation: Slow improvement continues-she still wheezing-and not yet ready for discharge as she is not back to her usual baseline.  Continue with bronchodilators, steroids and other supportive care.  Encourage incentive spirometry/flutter valve.  Mobilize.   Community acquired pneumonia: Slowly improving-she continues to be afebrile-however her cough continues.  Plans are to continue with empiric antimicrobial therapy-but will switch to oral regimen.  Influenza PCR and respiratory virus panel are negative.  Blood culture negative as well.  HIV nonreactive.  Leukocytosis: Suspect this is secondary to steroids-rather than pneumonia-as she has improved.  Continue to follow WBC periodically  Acute kidney injury: Mild hemodynamically mediated kidney injury-resolved with supportive care.  Hypertension: Relatively well controlled-continue with Cardizem-resume HCTZ on discharge  Rhinitis/nasal congestion: Improving with antihistamines, Flonase and decongestant.  DVT Prophylaxis: Prophylactic Heparin   Code Status: Full code  Family Communication: None at bedside  Disposition Plan: Remain inpatient-home in the next 1-2 days depending on clinical improvement.  Antimicrobial agents: Anti-infectives (From admission, onward)   Start      Dose/Rate Route Frequency Ordered Stop   07/14/17 1200  cefUROXime (CEFTIN) tablet 500 mg     500 mg Oral 2 times daily with meals 07/14/17 1052     07/12/17 1600  cefTRIAXone (ROCEPHIN) 1 g in dextrose 5 % 50 mL IVPB  Status:  Discontinued     1 g 100 mL/hr over 30 Minutes Intravenous Every 24 hours 07/11/17 1850 07/14/17 1052   07/12/17 1600  azithromycin (ZITHROMAX) 500 mg in dextrose 5 % 250 mL IVPB  Status:  Discontinued     500 mg 250 mL/hr over 60 Minutes Intravenous Every 24 hours 07/11/17 1850 07/14/17 1052   07/11/17 1545  cefTRIAXone (ROCEPHIN) 1 g in dextrose 5 % 50 mL IVPB     1 g 100 mL/hr over 30 Minutes Intravenous  Once 07/11/17 1539 07/11/17 1626   07/11/17 1545  azithromycin (ZITHROMAX) 500 mg in dextrose 5 % 250 mL IVPB     500 mg 250 mL/hr over 60 Minutes Intravenous  Once 07/11/17 1540 07/11/17 1733   07/11/17 1530  levofloxacin (LEVAQUIN) tablet 500 mg  Status:  Discontinued     500 mg Oral  Once 07/11/17 1529 07/11/17 1538      Procedures: None  CONSULTS:  None  Time spent: 25 minutes-Greater than 50% of this time was spent in counseling, explanation of diagnosis, planning of further management, and coordination of care.  MEDICATIONS: Scheduled Meds: . azelastine  2 spray Each Nare Daily   And  . fluticasone  2 spray Each Nare Daily  . budesonide (PULMICORT) nebulizer solution  0.5 mg Nebulization BID  . cefUROXime  500 mg Oral BID WC  . dextromethorphan-guaiFENesin  2 tablet Oral BID  . diltiazem  90 mg Oral Daily  .  heparin  5,000 Units Subcutaneous Q8H  . ipratropium-albuterol  3 mL Nebulization Q6H  . loratadine  10 mg Oral Daily  . methylPREDNISolone (SOLU-MEDROL) injection  40 mg Intravenous Q12H  . pantoprazole  40 mg Oral Q0600  . polyethylene glycol  17 g Oral BID  . senna-docusate  1 tablet Oral BID   Continuous Infusions: . sodium chloride 10 mL/hr at 07/13/17 0749   PRN Meds:.acetaminophen **OR** acetaminophen, albuterol, ibuprofen,  methocarbamol, ondansetron **OR** ondansetron (ZOFRAN) IV   PHYSICAL EXAM: Vital signs: Vitals:   07/13/17 2203 07/14/17 0236 07/14/17 0458 07/14/17 0832  BP: (!) 168/51  (!) 165/69   Pulse: 69  66   Resp: 18  18   Temp: 97.7 F (36.5 C)  97.6 F (36.4 C)   TempSrc: Oral  Oral   SpO2: 98% 97% 99% 99%  Weight:       Filed Weights   07/12/17 1000  Weight: 64.9 kg (143 lb)   Body mass index is 22.07 kg/m.   General appearance :Awake, alert, not in any distress.  Chronically sick appearing Eyes:, pupils equally reactive to light and accomodation,no scleral icterus. HEENT: Atraumatic and Normocephalic Neck: supple, no JVD. Resp:Good air entry bilaterally-rhonchi all over CVS: S1 S2 regular, no murmurs.  GI: Bowel sounds present, Non tender and not distended with no gaurding, rigidity or rebound. Extremities: B/L Lower Ext shows no edema, both legs are warm to touch Neurology:  speech clear,Non focal, sensation is grossly intact. Psychiatric: Normal judgment and insight. Normal mood. Musculoskeletal:No digital cyanosis Skin:No Rash, warm and dry Wounds:N/A  I have personally reviewed following labs and imaging studies  LABORATORY DATA: CBC: Recent Labs  Lab 07/11/17 1411 07/11/17 1932 07/12/17 0605 07/13/17 0615  WBC 15.2* 12.9* 8.7 23.4*  NEUTROABS 11.9*  --   --  21.1*  HGB 13.5 12.8 12.1 11.8*  HCT 40.7 38.0 36.8 36.7  MCV 89.8 90.3 90.2 92.7  PLT 339 315 308 161    Basic Metabolic Panel: Recent Labs  Lab 07/11/17 1411 07/11/17 1932 07/12/17 0605 07/13/17 0615  NA 135  --  135 137  K 3.3*  --  3.5 4.3  CL 94*  --  100* 105  CO2 27  --  25 24  GLUCOSE 153*  --  180* 212*  BUN 34*  --  29* 30*  CREATININE 1.04* 1.13* 0.96 0.98  CALCIUM 9.2  --  8.8* 8.8*  MG  --  1.9  --   --     GFR: Estimated Creatinine Clearance: 51.4 mL/min (by C-G formula based on SCr of 0.98 mg/dL).  Liver Function Tests: Recent Labs  Lab 07/11/17 1411  AST 24  ALT 25   ALKPHOS 77  BILITOT 1.0  PROT 6.9  ALBUMIN 3.9   No results for input(s): LIPASE, AMYLASE in the last 168 hours. No results for input(s): AMMONIA in the last 168 hours.  Coagulation Profile: No results for input(s): INR, PROTIME in the last 168 hours.  Cardiac Enzymes: No results for input(s): CKTOTAL, CKMB, CKMBINDEX, TROPONINI in the last 168 hours.  BNP (last 3 results) No results for input(s): PROBNP in the last 8760 hours.  HbA1C: No results for input(s): HGBA1C in the last 72 hours.  CBG: No results for input(s): GLUCAP in the last 168 hours.  Lipid Profile: No results for input(s): CHOL, HDL, LDLCALC, TRIG, CHOLHDL, LDLDIRECT in the last 72 hours.  Thyroid Function Tests: No results for input(s): TSH, T4TOTAL, FREET4, T3FREE, THYROIDAB in  the last 72 hours.  Anemia Panel: No results for input(s): VITAMINB12, FOLATE, FERRITIN, TIBC, IRON, RETICCTPCT in the last 72 hours.  Urine analysis:    Component Value Date/Time   COLORURINE LT. YELLOW 07/05/2011 1100   APPEARANCEUR CLEAR 07/05/2011 1100   LABSPEC 1.015 07/05/2011 1100   PHURINE 5.5 07/05/2011 1100   GLUCOSEU NEGATIVE 07/05/2011 1100   HGBUR TRACE-LYSED 07/05/2011 1100   BILIRUBINUR NEGATIVE 07/05/2011 1100   KETONESUR NEGATIVE 07/05/2011 1100   UROBILINOGEN 0.2 07/05/2011 1100   NITRITE NEGATIVE 07/05/2011 1100   LEUKOCYTESUR NEGATIVE 07/05/2011 1100    Sepsis Labs: Lactic Acid, Venous No results found for: LATICACIDVEN  MICROBIOLOGY: Recent Results (from the past 240 hour(s))  Culture, blood (routine x 2) Call MD if unable to obtain prior to antibiotics being given     Status: None (Preliminary result)   Collection Time: 07/11/17  7:32 PM  Result Value Ref Range Status   Specimen Description BLOOD RIGHT HAND  Final   Special Requests IN PEDIATRIC BOTTLE Blood Culture adequate volume  Final   Culture   Final    NO GROWTH 2 DAYS Performed at [facility], [place] [address], [place],  [place] [address]    Report Status PENDING  Incomplete  Culture, blood (routine x 2) Call MD if unable to obtain prior to antibiotics being given     Status: None (Preliminary result)   Collection Time: 07/11/17  7:32 PM  Result Value Ref Range Status   Specimen Description BLOOD RIGHT ANTECUBITAL  Final   Special Requests   Final    BOTTLES DRAWN AEROBIC ONLY Blood Culture adequate volume   Culture   Final    NO GROWTH 2 DAYS Performed at [facility], [address] [place], [place] [address]    Report Status PENDING  Incomplete  Respiratory Panel by PCR     Status: None   Collection Time: 07/11/17  7:34 PM  Result Value Ref Range Status   Adenovirus NOT DETECTED NOT DETECTED Final   Coronavirus 229E NOT DETECTED NOT DETECTED Final   Coronavirus HKU1 NOT DETECTED NOT DETECTED Final   Coronavirus NL63 NOT DETECTED NOT DETECTED Final   Coronavirus OC43 NOT DETECTED NOT DETECTED Final   Metapneumovirus NOT DETECTED NOT DETECTED Final   Rhinovirus / Enterovirus NOT DETECTED NOT DETECTED Final   Influenza A NOT DETECTED NOT DETECTED Final   Influenza B NOT DETECTED NOT DETECTED Final   Parainfluenza Virus 1 NOT DETECTED NOT DETECTED Final   Parainfluenza Virus 2 NOT DETECTED NOT DETECTED Final   Parainfluenza Virus 3 NOT DETECTED NOT DETECTED Final   Parainfluenza Virus 4 NOT DETECTED NOT DETECTED Final   Respiratory Syncytial Virus NOT DETECTED NOT DETECTED Final   Bordetella pertussis NOT DETECTED NOT DETECTED Final   Chlamydophila pneumoniae NOT DETECTED NOT DETECTED Final   Mycoplasma pneumoniae NOT DETECTED NOT DETECTED Final    Comment: Performed at [facility], [place] [address] [place], [place] [address]    RADIOLOGY STUDIES/RESULTS: Dg Chest 2 View  Result Date: 07/11/2017 CLINICAL DATA:  Shortness of breath, nausea. Recently diagnosed with flu and exacerbation of COPD. Patient also reports back pain for the past 2 days since rolling over wrong in bed. Former smoker.  EXAM: CHEST  2 VIEW COMPARISON:  Chest x-ray of June 16, 2015. FINDINGS: The lungs are hyperinflated with hemidiaphragm flattening. The lung markings are coarse in the lower lobes. There new increased density in the retrocardiac region.  The heart and pulmonary vascularity are normal. There calcification in the wall of the thoracic aorta. The bony thorax exhibits no acute abnormality. IMPRESSION: Increased retrocardiac density likely on the left is compatible with atelectasis or pneumonia. There is underlying COPD. Followup PA and lateral chest X-ray is recommended in 3-4 weeks following trial of antibiotic therapy to ensure resolution and exclude underlying malignancy. Thoracic aortic atherosclerosis. Electronically Signed   By: David  Martinique M.D.   On: 07/11/2017 14:38     LOS: 3 days   Oren Binet, MD  [facility] Pager:[phone]  If 7PM-7AM, please contact night-coverage [email or web address] Password [facility] 07/14/2017, 11:55 AM

## 2017-07-15 ENCOUNTER — Inpatient Hospital Stay (HOSPITAL_COMMUNITY): Payer: Medicare Other

## 2017-07-15 DIAGNOSIS — I4891 Unspecified atrial fibrillation: Secondary | ICD-10-CM

## 2017-07-15 LAB — COMPREHENSIVE METABOLIC PANEL
ALK PHOS: 80 U/L (ref 38–126)
ALT: 102 U/L — AB (ref 14–54)
AST: 68 U/L — AB (ref 15–41)
Albumin: 3.7 g/dL (ref 3.5–5.0)
Anion gap: 11 (ref 5–15)
BUN: 29 mg/dL — AB (ref 6–20)
CALCIUM: 9.1 mg/dL (ref 8.9–10.3)
CO2: 26 mmol/L (ref 22–32)
CREATININE: 0.79 mg/dL (ref 0.44–1.00)
Chloride: 99 mmol/L — ABNORMAL LOW (ref 101–111)
GFR calc non Af Amer: >60 mL/min (ref >60)
Glucose, Bld: 306 mg/dL — ABNORMAL HIGH (ref 65–99)
Potassium: 4.2 mmol/L (ref 3.5–5.1)
SODIUM: 136 mmol/L (ref 135–145)
Total Bilirubin: 0.5 mg/dL (ref 0.3–1.2)
Total Protein: 6.7 g/dL (ref 6.5–8.1)

## 2017-07-15 LAB — TSH: TSH: 0.306 u[IU]/mL — AB (ref 0.350–4.500)

## 2017-07-15 LAB — CBC
HCT: 43.7 % (ref 36.0–46.0)
HEMOGLOBIN: 14.3 g/dL (ref 12.0–15.0)
MCH: 30 pg (ref 26.0–34.0)
MCHC: 32.7 g/dL (ref 30.0–36.0)
MCV: 91.6 fL (ref 78.0–100.0)
Platelets: 357 10*3/uL (ref 150–400)
RBC: 4.77 MIL/uL (ref 3.87–5.11)
RDW: 14.3 % (ref 11.5–15.5)
WBC: 21.2 10*3/uL — AB (ref 4.0–10.5)

## 2017-07-15 LAB — ECHOCARDIOGRAM COMPLETE
HEIGHTINCHES: 67 in
Weight: 2172.85 oz

## 2017-07-15 LAB — MRSA PCR SCREENING: MRSA by PCR: NEGATIVE

## 2017-07-15 LAB — TROPONIN I: Troponin I: <0.03 ng/mL (ref <0.03)

## 2017-07-15 MED ORDER — METOPROLOL TARTRATE 5 MG/5ML IV SOLN: 5.0000 mg | Freq: Once | INTRAVENOUS | Status: AC

## 2017-07-15 MED ORDER — HYDRALAZINE HCL 25 MG PO TABS: 25.0000 mg | ORAL_TABLET | Freq: Two times a day (BID) | ORAL | 0 refills | Status: DC

## 2017-07-15 MED ORDER — DM-GUAIFENESIN ER 30-600 MG PO TB12: 2.0000 | ORAL_TABLET | Freq: Two times a day (BID) | ORAL | 0 refills | Status: DC

## 2017-07-15 MED ORDER — LEVALBUTEROL HCL 0.63 MG/3ML IN NEBU: 0.6300 mg | INHALATION_SOLUTION | Freq: Four times a day (QID) | RESPIRATORY_TRACT | Status: DC | PRN

## 2017-07-15 MED ORDER — SODIUM CHLORIDE 0.9 % IV BOLUS (SEPSIS): 250.0000 mL | Freq: Once | INTRAVENOUS | Status: AC

## 2017-07-15 MED ORDER — ALBUTEROL SULFATE HFA 108 (90 BASE) MCG/ACT IN AERS: 2.0000 | INHALATION_SPRAY | RESPIRATORY_TRACT | 0 refills | Status: DC | PRN

## 2017-07-15 MED ORDER — METHOCARBAMOL 500 MG PO TABS: 500.0000 mg | ORAL_TABLET | Freq: Three times a day (TID) | ORAL | 0 refills | Status: DC | PRN

## 2017-07-15 MED ORDER — IPRATROPIUM BROMIDE 0.02 % IN SOLN
0.5000 mg | Freq: Three times a day (TID) | RESPIRATORY_TRACT | Status: DC
  Administered 2017-07-15 – 2017-07-16 (×2): 0.5 mg via RESPIRATORY_TRACT
  Filled 2017-07-15 (×2): qty 2.5

## 2017-07-15 MED ORDER — IPRATROPIUM BROMIDE 0.02 % IN SOLN: 0.5000 mg | Freq: Three times a day (TID) | RESPIRATORY_TRACT | Status: DC

## 2017-07-15 MED ORDER — METOPROLOL TARTRATE 5 MG/5ML IV SOLN
5.0000 mg | INTRAVENOUS | Status: DC | PRN
  Administered 2017-07-15: 5 mg via INTRAVENOUS

## 2017-07-15 MED ORDER — METOPROLOL TARTRATE 5 MG/5ML IV SOLN
INTRAVENOUS | Status: AC
  Filled 2017-07-15: qty 5

## 2017-07-15 MED ORDER — HYDRALAZINE HCL 25 MG PO TABS: 25.0000 mg | ORAL_TABLET | Freq: Three times a day (TID) | ORAL | Status: DC

## 2017-07-15 MED ORDER — HEPARIN BOLUS VIA INFUSION
4000.0000 [IU] | Freq: Once | INTRAVENOUS | Status: AC
  Administered 2017-07-15: 4000 [IU] via INTRAVENOUS
  Filled 2017-07-15: qty 4000

## 2017-07-15 MED ORDER — SODIUM CHLORIDE 0.9 % IV BOLUS (SEPSIS)
250.0000 mL | Freq: Once | INTRAVENOUS | Status: AC
  Administered 2017-07-15: 250 mL via INTRAVENOUS

## 2017-07-15 MED ORDER — LEVALBUTEROL HCL 0.63 MG/3ML IN NEBU
0.6300 mg | INHALATION_SOLUTION | Freq: Three times a day (TID) | RESPIRATORY_TRACT | Status: DC
  Administered 2017-07-15 – 2017-07-16 (×2): 0.63 mg via RESPIRATORY_TRACT
  Filled 2017-07-15 (×2): qty 3

## 2017-07-15 MED ORDER — LEVALBUTEROL HCL 0.63 MG/3ML IN NEBU: 0.6300 mg | INHALATION_SOLUTION | Freq: Three times a day (TID) | RESPIRATORY_TRACT | Status: DC

## 2017-07-15 MED ORDER — BUDESONIDE-FORMOTEROL FUMARATE 160-4.5 MCG/ACT IN AERO: 2.0000 | INHALATION_SPRAY | Freq: Two times a day (BID) | RESPIRATORY_TRACT | 0 refills | Status: DC

## 2017-07-15 MED ORDER — CEFUROXIME AXETIL 500 MG PO TABS: 500.0000 mg | ORAL_TABLET | Freq: Two times a day (BID) | ORAL | 0 refills | Status: DC

## 2017-07-15 MED ORDER — HYDRALAZINE HCL 20 MG/ML IJ SOLN
10.0000 mg | Freq: Once | INTRAMUSCULAR | Status: AC
  Administered 2017-07-15: 10 mg via INTRAVENOUS
  Filled 2017-07-15: qty 1

## 2017-07-15 MED ORDER — IPRATROPIUM-ALBUTEROL 0.5-2.5 (3) MG/3ML IN SOLN: 3.0000 mL | Freq: Four times a day (QID) | RESPIRATORY_TRACT | 0 refills | Status: DC | PRN

## 2017-07-15 MED ORDER — DILTIAZEM LOAD VIA INFUSION
15.0000 mg | Freq: Once | INTRAVENOUS | Status: AC
  Administered 2017-07-15: 15 mg via INTRAVENOUS
  Filled 2017-07-15: qty 15

## 2017-07-15 MED ORDER — HYDROCHLOROTHIAZIDE 12.5 MG PO CAPS
12.5000 mg | ORAL_CAPSULE | Freq: Every day | ORAL | Status: DC
  Administered 2017-07-15: 12.5 mg via ORAL
  Filled 2017-07-15: qty 1

## 2017-07-15 MED ORDER — HYDRALAZINE HCL 20 MG/ML IJ SOLN
10.0000 mg | Freq: Four times a day (QID) | INTRAMUSCULAR | Status: DC | PRN
  Administered 2017-07-15 (×2): 10 mg via INTRAVENOUS
  Filled 2017-07-15 (×3): qty 1

## 2017-07-15 MED ORDER — HEPARIN (PORCINE) IN NACL 100-0.45 UNIT/ML-% IJ SOLN
1000.0000 [IU]/h | INTRAMUSCULAR | Status: DC
  Administered 2017-07-15: 1000 [IU]/h via INTRAVENOUS
  Filled 2017-07-15 (×2): qty 250

## 2017-07-15 MED ORDER — DILTIAZEM HCL 100 MG IV SOLR
5.0000 mg/h | INTRAVENOUS | Status: DC
  Administered 2017-07-15: 5 mg/h via INTRAVENOUS
  Filled 2017-07-15 (×2): qty 100

## 2017-07-15 MED ORDER — LORATADINE 10 MG PO TABS: 10.0000 mg | ORAL_TABLET | Freq: Every day | ORAL | 0 refills | Status: DC

## 2017-07-15 NOTE — Progress Notes (Signed)
HR 144. MD notified via text page.

## 2017-07-15 NOTE — Progress Notes (Signed)
11:56 BP 161/96 HR 144 irregular; pt asymptomatic.  MD notified via text page. EKG showed Afib RVR.  MD notified.  patient transferred to ICU room 1231.  Report given to Ebony Hail, RN at bedside.  Spouse updated via telephone and given new room number.

## 2017-07-15 NOTE — Progress Notes (Signed)
Physical Therapy Treatment Patient Details Name: Diane Patterson MRN: [id number] DOB: 01/03/1946 Today's Date: 07/15/2017    History of Present Illness Diane Patterson is a 72 y/o woman with PMH significant for COPD/asthma and HTN; who presented to ED with complaints of productive cough, SOB, increase wheezing and general malaise.    PT Comments    Pt just got back from bathroom sitting in recliner with noted distress and anxiety.  Pt on 3 lts nasal.  Pt stated she is on 2 lts at home.  During sit to stand noted HR irregular and high.  Assisted back to recliner. RN called to room.    Follow Up Recommendations  Home health PT(oxygen)     Equipment Recommendations  Rolling walker with 5" wheels    Recommendations for Other Services       Precautions / Restrictions Precautions Precautions: Fall Precaution Comments: monitor O2 Restrictions Weight Bearing Restrictions: No    Mobility  Bed Mobility               General bed mobility comments: OOB in recliner  Transfers Overall transfer level: Needs assistance Equipment used: Rolling walker (2 wheeled) Transfers: Sit to/from Stand Sit to Stand: Min guard         General transfer comment: cues for hand placement  Ambulation/Gait             General Gait Details: did not attempt amb due to high HR   Stairs            Wheelchair Mobility    Modified Rankin (Stroke Patients Only)       Balance                                            Cognition Arousal/Alertness: Awake/alert                                     General Comments: anxious      Exercises      General Comments        Pertinent Vitals/Pain      Home Living                      Prior Function            PT Goals (current goals can now be found in the care plan section) Progress towards PT goals: Progressing toward goals    Frequency    Min 3X/week       PT Plan Current plan remains appropriate    Co-evaluation              AM-PAC PT "6 Clicks" Daily Activity  Outcome Measure  Difficulty turning over in bed (including adjusting bedclothes, sheets and blankets)?: A Little Difficulty moving from lying on back to sitting on the side of the bed? : A Little Difficulty sitting down on and standing up from a chair with arms (e.g., wheelchair, bedside commode, etc,.)?: A Little Help needed moving to and from a bed to chair (including a wheelchair)?: A Little Help needed walking in hospital room?: A Little Help needed climbing 3-5 steps with a railing? : A Little 6 Click Score: 18    End of Session Equipment Utilized During Treatment: Gait belt;Oxygen Activity Tolerance: Patient tolerated  treatment well Patient left: in chair;with call bell/phone within reach Nurse Communication: (high HR) PT Visit Diagnosis: Other abnormalities of gait and mobility (R26.89);Unsteadiness on feet (R26.81);Muscle weakness (generalized) (M62.81)     Time: 0721-8288 PT Time Calculation (min) (ACUTE ONLY): 14 min  Charges:  $Therapeutic Activity: 8-22 mins                    G Codes:        Rica Koyanagi  PTA [facility]  Acute  Rehab Pager      [phone]

## 2017-07-15 NOTE — Progress Notes (Signed)
ANTICOAGULATION CONSULT NOTE   Pharmacy Consult for heparin Indication: atrial fibrillation with RVR  No Known Allergies  Patient Measurements: Weight: 143 lb (64.9 kg) Heparin Dosing Weight: 65 kg  Vital Signs: Temp: 97.5 F (36.4 C) (02/01 0531) Temp Source: Oral (02/01 0531) BP: 161/96 (02/01 1156) Pulse Rate: 144 (02/01 1156)  Labs: Recent Labs    07/13/17 0615  HGB 11.8*  HCT 36.7  PLT 322  CREATININE 0.98    Estimated Creatinine Clearance: 51.4 mL/min (by C-G formula based on SCr of 0.98 mg/dL).  Assessment: Patient is a 72 y.o F with hx COPD presented to the ED on 07/11/17 with c/o SOB and nausea.  To start heparin drip on 07/15/17 for afib with RVR.   Goal of Therapy:  Heparin level 0.3-0.7 units/ml Monitor platelets by anticoagulation protocol: Yes   Plan:  - d/c SQ heparin  - heparin 4000 units IV x1 bolus, then drip at 1000 units/hr - check 8 hr heparin level - monitor for s/s bleeding  Eddie Payette P 07/15/2017,12:52 PM

## 2017-07-15 NOTE — Significant Event (Signed)
Rapid Response Event Note  Overview: Time Called: 1233 Arrival Time: 8937 Event Type: Cardiac Notified by [place] RN in regards to patient's heart rate being in the 170s with Afib RVR, EKG completed and confirmed upon arrival to unit. As entering the room, patient was sitting in bedside chair and appeared to be asymptomatic with Afib, RVR.  Initial Focused Assessment: Neuro: Patient alert and oriented x 4, pupils equal and responsive to light, 1-2+, patient following all commands with ease, all extremities purposeful movements.  Cardiac: Afib RVR, S1 and S2 irregular upon auscultation, pulses radial and pedal, 1+ and weak, bilateral. Patient also HTN, see in vital signs. Pulmonary: Breath sounds in all lung fields are clear and diminished, RR 18-20, patient on 3 LNC, O2 95-100%.  Interventions: MD Aware  Start Cardizem gtt and bolus per orders Transfer to SDU   Plan of Care (if not transferred):  Event Summary:   at      at          [place]

## 2017-07-15 NOTE — Progress Notes (Signed)
Patients BP elevated into the 200's with HR sustaining 150's to 160.  Cardizem titrated to max at 15mg /ml.  RN contacted Dr. Sloan Leiter and received an order for hydralazine, RN reassessed BP continue to be elevated in the 200's an order for Lopressor obtained and a dose given as well as an order to increase the cardizem to 20mg /ml.  Patients BP dropped into the 70's cardiology assessed patient at bedside.  RN received orders for a 243ml bolus of NS and to change the rate of the cardizem gtt to 5mg /ml.  Pt. HR continued to be elevated in the 130's RN contacted PA Charissa Bash and received an order to titrate Cardizem from 5-15mg /ml as needed.  Cardizem increased to 10mg /hr and ten minutes later patient converted back into sinus rhythm.  Cardizem gtt decreased to 5mg /hr.  RN will continue to monitor HR and BP.

## 2017-07-15 NOTE — Progress Notes (Signed)
Afib with RVR Hold d/c Starting cardizem gtt and heparin gtt (Chads2vasc of 3) Obtain Echo Transfer to SDU

## 2017-07-15 NOTE — Progress Notes (Signed)
  Echocardiogram 2D Echocardiogram has been performed.  Diane Patterson M 07/15/2017, 3:05 PM

## 2017-07-15 NOTE — Discharge Summary (Addendum)
PATIENT DETAILS Name: Diane Patterson Age: 72 y.o. Sex: female Date of Birth: 16-Dec-1945 MRN: [id number]. Admitting Physician: Barton Dubois, MD WUX:[id number], No Pcp Per  Admit Date: 07/11/2017 Discharge date: 07/15/2017  Recommendations for Outpatient Follow-up:  1. Follow up with PCP in 1-2 weeks 2. Please obtain BMP/CBC in one week 3. Repeat two-view chest x-ray in 4-6 weeks to document resolution of pneumonia. 4. Please follow up on the following pending results: Blood cultures until final  Admitted From:  Home  Disposition: Home with home health services   [place]: Yes  Equipment/Devices: Oxygen 2L  Discharge Condition: Stable  CODE STATUS: FULL CODE  Diet recommendation:  Heart Healthy   Brief Summary: See H&P, Labs, Consult and Test reports for all details in brief, Patient is a 72 y.o. female with history of COPD-chronic hypoxic respiratory failure on nocturnal O2-admitted to the hospitalist service for COPD exacerbation and pneumonia-slowly improving with steroids, bronchodilators and empiric antimicrobial therapy.  See below for further details.  Brief Hospital Course: COPD exacerbation: Much improved-hardly any wheezing this morning-think she is back to her baseline-managed with steroids, bronchodilators and empiric antimicrobial therapy.  Claims that at baseline she is just about able to ambulate at home-she is on home O2.  Since she is back to her baseline-we will taper steroids, continue her usual bronchodilator regimen and have her follow-up with pulmonology-have made her a follow-up appointment.   Community acquired pneumonia: Much improved-she is afebrile-continues to have leukocytosis but this is secondary to steroids.  She is clinically markedly better. Some lingering cough continues.  Managed with Ceftin and Zithromax-transition to cefuroxime on discharge.  Please repeat 2 view chest x-ray in 4-6 weeks.  Blood cultures continue to be negative,  influenza PCR respiratory virus panel negative as well.  HIV nonreactive.  Leukocytosis: Secondary to steroids-although has pneumonia-she has improved clinically.  Repeat CBC in the outpatient setting  Chronic hypoxic respiratory failure: Likely secondary to underlying COPD-resume home O2.  Acute kidney injury: Mild hemodynamically mediated kidney injury-resolved with supportive care.  Hypertension:  Controlled overnight-Cardizem has been continued-HCTZ has been resumed.  I have added hydralazine.  Further optimization will be deferred to the outpatient setting  Rhinitis/nasal congestion: Improving with antihistamines, Flonase and decongestant other supportive care  Procedures/Studies: None  Discharge Diagnoses:  Principal Problem:   CAP (community acquired pneumonia) Active Problems:   Constipation   COPD with acute exacerbation ([place])   Essential hypertension   Hyperlipidemia   AKI (acute kidney injury) [facility])   Discharge Instructions:  Activity:  As tolerated with Full fall precautions use walker/cane & assistance as needed   Discharge Instructions    Diet - low sodium heart healthy   Complete by:  As directed    Discharge instructions   Complete by:  As directed    Follow with Primary MD  Patient in 1 week   You had Pneumonia: Please ask your primary care practitioner to get a 2 view Chest X ray done in 4-6 weeks after hospital discharge   Please get a complete blood count and chemistry panel checked by your Primary MD at your next visit, and again as instructed by your Primary MD.  Get Medicines reviewed and adjusted: Please take all your medications with you for your next visit with your Primary MD  Laboratory/radiological data: Please request your Primary MD to go over all hospital tests and procedure/radiological results at the follow up, please ask your Primary MD to get all Hospital records sent to  his/her office.  In some cases, they will be blood work,  cultures and biopsy results pending at the time of your discharge. Please request that your primary care M.D. follows up on these results.  Also Note the following: If you experience worsening of your admission symptoms, develop shortness of breath, life threatening emergency, suicidal or homicidal thoughts you must seek medical attention immediately by calling 911 or calling your MD immediately  if symptoms less severe.  You must read complete instructions/literature along with all the possible adverse reactions/side effects for all the Medicines you take and that have been prescribed to you. Take any new Medicines after you have completely understood and accpet all the possible adverse reactions/side effects.   Do not drive when taking Pain medications or sleeping medications (Benzodaizepines)  Do not take more than prescribed Pain, Sleep and Anxiety Medications. It is not advisable to combine anxiety,sleep and pain medications without talking with your primary care practitioner  Special Instructions: If you have smoked or chewed Tobacco  in the last 2 yrs please stop smoking, stop any regular Alcohol  and or any Recreational drug use.  Wear Seat belts while driving.  Please note: You were cared for by a hospitalist during your hospital stay. Once you are discharged, your primary care physician will handle any further medical issues. Please note that NO REFILLS for any discharge medications will be authorized once you are discharged, as it is imperative that you return to your primary care physician (or establish a relationship with a primary care physician if you do not have one) for your post hospital discharge needs so that they can reassess your need for medications and monitor your lab values.   Increase activity slowly   Complete by:  As directed      Allergies as of 07/15/2017   No Known Allergies     Medication List    STOP taking these medications   doxycycline 100 MG  capsule Commonly known as:  VIBRAMYCIN   predniSONE 20 MG tablet Commonly known as:  DELTASONE     TAKE these medications   acetaminophen 325 MG tablet Commonly known as:  TYLENOL Take 650 mg by mouth every 6 (six) hours as needed for mild pain.   albuterol 108 (90 Base) MCG/ACT inhaler Commonly known as:  PROVENTIL HFA;VENTOLIN HFA Inhale 2 puffs into the lungs every 4 (four) hours as needed for wheezing or shortness of breath.   Azelastine-Fluticasone 137-50 MCG/ACT Susp Place 2 sprays into the nose once. What changed:  when to take this   budesonide-formoterol 160-4.5 MCG/ACT inhaler Commonly known as:  SYMBICORT Inhale 2 puffs into the lungs 2 (two) times daily.   cefUROXime 500 MG tablet Commonly known as:  CEFTIN Take 1 tablet (500 mg total) by mouth 2 (two) times daily with a meal.   dextromethorphan-guaiFENesin 30-600 MG 12hr tablet Commonly known as:  MUCINEX DM Take 2 tablets by mouth 2 (two) times daily.   diltiazem 90 MG 12 hr capsule Commonly known as:  CARDIZEM SR TAKE 1 CAPSULE BY MOUTH DAILY   hydrALAZINE 25 MG tablet Commonly known as:  APRESOLINE Take 1 tablet (25 mg total) by mouth 2 (two) times daily.   hydrochlorothiazide 12.5 MG capsule Commonly known as:  MICROZIDE TAKE 1 CAPSULE (12.5 MG TOTAL) BY MOUTH DAILY.   ipratropium-albuterol 0.5-2.5 (3) MG/3ML Soln Commonly known as:  DUONEB Take 3 mLs by nebulization every 6 (six) hours as needed.   loratadine 10 MG tablet Commonly  known as:  CLARITIN Take 1 tablet (10 mg total) by mouth daily. Start taking on:  07/16/2017   methocarbamol 500 MG tablet Commonly known as:  ROBAXIN Take 1 tablet (500 mg total) by mouth every 8 (eight) hours as needed for muscle spasms.   potassium chloride SA 20 MEQ tablet Commonly known as:  K-DUR,KLOR-CON Take 1 tablet (20 mEq total) by mouth daily.            Durable Medical Equipment  (From admission, onward)        Start     Ordered   07/15/17  1110  For home use only DME Walker rolling  Once    Question:  Patient needs a walker to treat with the following condition  Answer:  Chronic respiratory failure with hypoxia ([place])   07/15/17 1110   07/15/17 1021  DME Oxygen  Once    Question Answer Comment  Mode or (Route) Nasal cannula   Liters per Minute 2   Frequency Continuous (stationary and portable oxygen unit needed)   Oxygen conserving device Yes   Oxygen delivery system Gas      07/15/17 1021   07/15/17 1017  For home use only DME Nebulizer machine  Once    Question:  Patient needs a nebulizer to treat with the following condition  Answer:  COPD (chronic obstructive pulmonary disease) ([place])   07/15/17 1017     Follow-up Information    Golden Circle, FNP. Schedule an appointment as soon as possible for a visit.   Specialties:  Family Medicine, Infectious Diseases Contact information: [address] [place] [address] [phone]        Melvenia Needles, NP Follow up on 07/25/2017.   Specialty:  Pulmonary Disease Why:  appt at 4:15 pm Contact information: [address] [address] [phone]          No Known Allergies  Consultations:   None   Other Procedures/Studies: Dg Chest 2 View  Result Date: 07/11/2017 CLINICAL DATA:  Shortness of breath, nausea. Recently diagnosed with flu and exacerbation of COPD. Patient also reports back pain for the past 2 days since rolling over wrong in bed. Former smoker. EXAM: CHEST  2 VIEW COMPARISON:  Chest x-ray of June 16, 2015. FINDINGS: The lungs are hyperinflated with hemidiaphragm flattening. The lung markings are coarse in the lower lobes. There new increased density in the retrocardiac region. The heart and pulmonary vascularity are normal. There calcification in the wall of the thoracic aorta. The bony thorax exhibits no acute abnormality. IMPRESSION: Increased retrocardiac density likely on the left is compatible with  atelectasis or pneumonia. There is underlying COPD. Followup PA and lateral chest X-ray is recommended in 3-4 weeks following trial of antibiotic therapy to ensure resolution and exclude underlying malignancy. Thoracic aortic atherosclerosis. Electronically Signed   By: David  Martinique M.D.   On: 07/11/2017 14:38     TODAY-DAY OF DISCHARGE:  Subjective:   Diane Patterson today has no headache,no chest abdominal pain,no new weakness tingling or numbness, feels much better wants to go home today.   Objective:   Blood pressure (!) 164/69, pulse (!) 109, temperature (!) 97.5 F (36.4 C), temperature source Oral, resp. rate 18, weight 64.9 kg (143 lb), SpO2 98 %.  Intake/Output Summary (Last 24 hours) at 07/15/2017 1111 Last data filed at 07/15/2017 0531 Gross per 24 hour  Intake 360 ml  Output -  Net 360 ml   Danley Danker  Weights   07/12/17 1000  Weight: 64.9 kg (143 lb)    Exam: Awake Alert, Oriented *3, No new F.N deficits, Normal affect [place].AT,PERRAL Supple Neck,No JVD, No cervical lymphadenopathy appriciated.  Symmetrical Chest wall movement, Good air movement bilaterally, CTAB RRR,No Gallops,Rubs or new Murmurs, No Parasternal Heave +ve B.Sounds, Abd Soft, Non tender, No organomegaly appriciated, No rebound -guarding or rigidity. No Cyanosis, Clubbing or edema, No new Rash or bruise   PERTINENT RADIOLOGIC STUDIES: Dg Chest 2 View  Result Date: 07/11/2017 CLINICAL DATA:  Shortness of breath, nausea. Recently diagnosed with flu and exacerbation of COPD. Patient also reports back pain for the past 2 days since rolling over wrong in bed. Former smoker. EXAM: CHEST  2 VIEW COMPARISON:  Chest x-ray of June 16, 2015. FINDINGS: The lungs are hyperinflated with hemidiaphragm flattening. The lung markings are coarse in the lower lobes. There new increased density in the retrocardiac region. The heart and pulmonary vascularity are normal. There calcification in the wall of the thoracic aorta. The  bony thorax exhibits no acute abnormality. IMPRESSION: Increased retrocardiac density likely on the left is compatible with atelectasis or pneumonia. There is underlying COPD. Followup PA and lateral chest X-ray is recommended in 3-4 weeks following trial of antibiotic therapy to ensure resolution and exclude underlying malignancy. Thoracic aortic atherosclerosis. Electronically Signed   By: David  Martinique M.D.   On: 07/11/2017 14:38     PERTINENT LAB RESULTS: CBC: Recent Labs    07/13/17 0615  WBC 23.4*  HGB 11.8*  HCT 36.7  PLT 322   CMET CMP     Component Value Date/Time   NA 137 07/13/2017 0615   NA 140 07/21/2016 1206   K 4.3 07/13/2017 0615   CL 105 07/13/2017 0615   CO2 24 07/13/2017 0615   GLUCOSE 212 (H) 07/13/2017 0615   BUN 30 (H) 07/13/2017 0615   BUN 14 07/21/2016 1206   CREATININE 0.98 07/13/2017 0615   CALCIUM 8.8 (L) 07/13/2017 0615   PROT 6.9 07/11/2017 1411   ALBUMIN 3.9 07/11/2017 1411   AST 24 07/11/2017 1411   ALT 25 07/11/2017 1411   ALKPHOS 77 07/11/2017 1411   BILITOT 1.0 07/11/2017 1411   GFRNONAA 56 (L) 07/13/2017 0615   GFRAA >60 07/13/2017 0615    GFR Estimated Creatinine Clearance: 51.4 mL/min (by C-G formula based on SCr of 0.98 mg/dL). No results for input(s): LIPASE, AMYLASE in the last 72 hours. No results for input(s): CKTOTAL, CKMB, CKMBINDEX, TROPONINI in the last 72 hours. Invalid input(s): POCBNP No results for input(s): DDIMER in the last 72 hours. No results for input(s): HGBA1C in the last 72 hours. No results for input(s): CHOL, HDL, LDLCALC, TRIG, CHOLHDL, LDLDIRECT in the last 72 hours. No results for input(s): TSH, T4TOTAL, T3FREE, THYROIDAB in the last 72 hours.  Invalid input(s): FREET3 No results for input(s): VITAMINB12, FOLATE, FERRITIN, TIBC, IRON, RETICCTPCT in the last 72 hours. Coags: No results for input(s): INR in the last 72 hours.  Invalid input(s): PT Microbiology: Recent Results (from the past 240  hour(s))  Culture, blood (routine x 2) Call MD if unable to obtain prior to antibiotics being given     Status: None (Preliminary result)   Collection Time: 07/11/17  7:32 PM  Result Value Ref Range Status   Specimen Description BLOOD RIGHT HAND  Final   Special Requests IN PEDIATRIC BOTTLE Blood Culture adequate volume  Final   Culture   Final    NO GROWTH 3  DAYS Performed at [facility], [place] [address], [place], [place] [address]    Report Status PENDING  Incomplete  Culture, blood (routine x 2) Call MD if unable to obtain prior to antibiotics being given     Status: None (Preliminary result)   Collection Time: 07/11/17  7:32 PM  Result Value Ref Range Status   Specimen Description BLOOD RIGHT ANTECUBITAL  Final   Special Requests   Final    BOTTLES DRAWN AEROBIC ONLY Blood Culture adequate volume   Culture   Final    NO GROWTH 3 DAYS Performed at [facility], [address] [place], [place] [address]    Report Status PENDING  Incomplete  Respiratory Panel by PCR     Status: None   Collection Time: 07/11/17  7:34 PM  Result Value Ref Range Status   Adenovirus NOT DETECTED NOT DETECTED Final   Coronavirus 229E NOT DETECTED NOT DETECTED Final   Coronavirus HKU1 NOT DETECTED NOT DETECTED Final   Coronavirus NL63 NOT DETECTED NOT DETECTED Final   Coronavirus OC43 NOT DETECTED NOT DETECTED Final   Metapneumovirus NOT DETECTED NOT DETECTED Final   Rhinovirus / Enterovirus NOT DETECTED NOT DETECTED Final   Influenza A NOT DETECTED NOT DETECTED Final   Influenza B NOT DETECTED NOT DETECTED Final   Parainfluenza Virus 1 NOT DETECTED NOT DETECTED Final   Parainfluenza Virus 2 NOT DETECTED NOT DETECTED Final   Parainfluenza Virus 3 NOT DETECTED NOT DETECTED Final   Parainfluenza Virus 4 NOT DETECTED NOT DETECTED Final   Respiratory Syncytial Virus NOT DETECTED NOT DETECTED Final   Bordetella pertussis NOT DETECTED NOT DETECTED Final   Chlamydophila pneumoniae NOT  DETECTED NOT DETECTED Final   Mycoplasma pneumoniae NOT DETECTED NOT DETECTED Final    Comment: Performed at [facility], [place] [address], [place], [place] [address]    FURTHER DISCHARGE INSTRUCTIONS:  Get Medicines reviewed and adjusted: Please take all your medications with you for your next visit with your Primary MD  Laboratory/radiological data: Please request your Primary MD to go over all hospital tests and procedure/radiological results at the follow up, please ask your Primary MD to get all Hospital records sent to his/her office.  In some cases, they will be blood work, cultures and biopsy results pending at the time of your discharge. Please request that your primary care M.D. goes through all the records of your hospital data and follows up on these results.  Also Note the following: If you experience worsening of your admission symptoms, develop shortness of breath, life threatening emergency, suicidal or homicidal thoughts you must seek medical attention immediately by calling 911 or calling your MD immediately  if symptoms less severe.  You must read complete instructions/literature along with all the possible adverse reactions/side effects for all the Medicines you take and that have been prescribed to you. Take any new Medicines after you have completely understood and accpet all the possible adverse reactions/side effects.   Do not drive when taking Pain medications or sleeping medications (Benzodaizepines)  Do not take more than prescribed Pain, Sleep and Anxiety Medications. It is not advisable to combine anxiety,sleep and pain medications without talking with your primary care practitioner  Special Instructions: If you have smoked or chewed Tobacco  in the last 2 yrs please stop smoking, stop any regular Alcohol  and or any Recreational drug use.  Wear Seat belts while driving.  Please note: You were cared for by a  hospitalist during your hospital stay. Once  you are discharged, your primary care physician will handle any further medical issues. Please note that NO REFILLS for any discharge medications will be authorized once you are discharged, as it is imperative that you return to your primary care physician (or establish a relationship with a primary care physician if you do not have one) for your post hospital discharge needs so that they can reassess your need for medications and monitor your lab values.  Total Time spent coordinating discharge including counseling, education and face to face time equals 45 minutes.  Signed: Shanker Ghimire 07/15/2017 11:11 AM

## 2017-07-15 NOTE — Consult Note (Signed)
Cardiology Consultation:   Patient ID: Diane Patterson; [id number]; [birth year]   Admit date: 07/11/2017 Date of Consult: 07/15/2017  Primary Care Provider: Patient, No Pcp Per Primary Cardiologist: Dr Marlou Porch  Patient Profile:   Diane Patterson is a 72 y.o. female with a hx of COPD who is being seen today for the evaluation of AF with RVR at the request of Dr Sloan Leiter.  History of Present Illness:   Diane Patterson is a pleasant 72 y/o with a history of significant COPD, on O2 QHS,  HTN, and chest pain. Diane Patterson had a low risk Myoview in Feb 2017. Diane Patterson last saw Dr Marlou Porch in Feb 2018 and he had discussed having a Rt and Lt heart cath but Diane Patterson ultimately declined. Diane Patterson was admitted to [facility] 07/11/17 with cough, increased DOE, and wheezing. Diane Patterson improved with antibiotics and steroids. Diane Patterson had actually been discharged this morning but became SOB when they got her up to ambulate. Diane Patterson was noted to be tachycardic on exam and Diane Patterson was placed on telemetry which revealed AF with RVR. Diane Patterson was started on IV Diltiazem and transferred to ICU. Initially Diane Patterson was hypertensive but then became hypotensive requiring IVF bolus and interruption of Diltiazem. Diane Patterson is comfortable now with VR 100-110. B/P is low, getting NS bolus. Diane Patterson denies chest pain. Echo has been done, results pending.   Past Medical History:  Diagnosis Date  . Asthma   . Chronic airflow obstruction (HCC)   . Hypertension   . Tobacco abuse   . Venous insufficiency     Past Surgical History:  Procedure Laterality Date  . cataract    . NECK SURGERY    . no colonoscopy     "afraid to "; [facility] reviewed  . VESICOVAGINAL FISTULA CLOSURE W/ TAH       Home Medications:  Prior to Admission medications   Medication Sig Start Date End Date Taking? Authorizing Provider  acetaminophen (TYLENOL) 325 MG tablet Take 650 mg by mouth every 6 (six) hours as needed for mild pain.    Yes [provider]  Azelastine-Fluticasone 137-50 MCG/ACT SUSP Place 2  sprays into the nose once. Patient taking differently: Place 2 sprays into the nose daily.  10/14/16 11/15/17 Yes Collene Gobble, MD  diltiazem (CARDIZEM SR) 90 MG 12 hr capsule TAKE 1 CAPSULE BY MOUTH DAILY 09/15/16  Yes Golden Circle, FNP  hydrochlorothiazide (MICROZIDE) 12.5 MG capsule TAKE 1 CAPSULE (12.5 MG TOTAL) BY MOUTH DAILY. 02/15/17  Yes Golden Circle, FNP  potassium chloride SA (K-DUR,KLOR-CON) 20 MEQ tablet Take 1 tablet (20 mEq total) by mouth daily. 07/23/16  Yes Jerline Pain, MD  predniSONE (DELTASONE) 20 MG tablet Take 1 tablet (20 mg total) by mouth daily with breakfast. 07/02/17  Yes Tereasa Coop, PA-C  albuterol (PROVENTIL HFA;VENTOLIN HFA) 108 (90 Base) MCG/ACT inhaler Inhale 2 puffs into the lungs every 4 (four) hours as needed for wheezing or shortness of breath. 07/15/17   Ghimire, Henreitta Leber, MD  budesonide-formoterol (SYMBICORT) 160-4.5 MCG/ACT inhaler Inhale 2 puffs into the lungs 2 (two) times daily. 07/15/17   Ghimire, Henreitta Leber, MD  cefUROXime (CEFTIN) 500 MG tablet Take 1 tablet (500 mg total) by mouth 2 (two) times daily with a meal. 07/15/17   Ghimire, Henreitta Leber, MD  dextromethorphan-guaiFENesin (MUCINEX DM) 30-600 MG 12hr tablet Take 2 tablets by mouth 2 (two) times daily. 07/15/17   Ghimire, Henreitta Leber, MD  hydrALAZINE (APRESOLINE) 25 MG tablet Take 1 tablet (25 mg total)  by mouth 2 (two) times daily. 07/15/17   Ghimire, Henreitta Leber, MD  ipratropium-albuterol (DUONEB) 0.5-2.5 (3) MG/3ML SOLN Take 3 mLs by nebulization every 6 (six) hours as needed. 07/15/17   Ghimire, Henreitta Leber, MD  loratadine (CLARITIN) 10 MG tablet Take 1 tablet (10 mg total) by mouth daily. 07/16/17   Ghimire, Henreitta Leber, MD  methocarbamol (ROBAXIN) 500 MG tablet Take 1 tablet (500 mg total) by mouth every 8 (eight) hours as needed for muscle spasms. 07/15/17   Ghimire, Henreitta Leber, MD    Inpatient Medications: Scheduled Meds: . azelastine  2 spray Each Nare Daily   And  . fluticasone  2 spray Each Nare Daily    . budesonide (PULMICORT) nebulizer solution  0.5 mg Nebulization BID  . cefUROXime  500 mg Oral BID WC  . dextromethorphan-guaiFENesin  2 tablet Oral BID  . ipratropium  0.5 mg Nebulization TID   And  . levalbuterol  0.63 mg Nebulization TID  . loratadine  10 mg Oral Daily  . methylPREDNISolone (SOLU-MEDROL) injection  40 mg Intravenous Q12H  . pantoprazole  40 mg Oral Q0600  . polyethylene glycol  17 g Oral BID  . senna-docusate  1 tablet Oral BID   Continuous Infusions: . sodium chloride 10 mL/hr at 07/13/17 0749  . diltiazem (CARDIZEM) infusion 15 mg/hr (07/15/17 1507)  . heparin 1,000 Units/hr (07/15/17 1348)   PRN Meds: acetaminophen **OR** acetaminophen, albuterol, hydrALAZINE, ibuprofen, methocarbamol, metoprolol tartrate, ondansetron **OR** ondansetron (ZOFRAN) IV  Allergies:   No Known Allergies  Social History:   Social History   Socioeconomic History  . Marital status: Married    Spouse name: Not on file  . Number of children: 1  . Years of education: Not on file  . Highest education level: Not on file  Social Needs  . Financial resource strain: Not on file  . Food insecurity - worry: Not on file  . Food insecurity - inability: Not on file  . Transportation needs - medical: Not on file  . Transportation needs - non-medical: Not on file  Occupational History  . Not on file  Tobacco Use  . Smoking status: Former Smoker    Packs/day: 0.75    Years: 15.00    Pack years: 11.25    Types: Cigarettes    Last attempt to quit: 03/20/2014    Years since quitting: 3.3  . Smokeless tobacco: Former Systems developer    Quit date: 03/11/2014  Substance and Sexual Activity  . Alcohol use: No    Alcohol/week: 0.0 oz  . Drug use: No  . Sexual activity: Not on file  Other Topics Concern  . Not on file  Social History Narrative  . Not on file    Family History:    Family History  Problem Relation Age of Onset  . Diabetes Mother   . Emphysema Father   . Asthma Father   .  Heart disease Father      ROS:  Please see the history of present illness.   no history of stroke or TIA.  No history of bleeding,...but her husband had a significant bleed on anticoagulation All other ROS reviewed and negative.     Physical Exam/Data:   Vitals:   07/15/17 1345 07/15/17 1400 07/15/17 1415 07/15/17 1430  BP: (!) 208/130 (!) 182/103 (!) 210/174 (!) 221/171  Pulse: (!) 146 (!) 152 (!) 51 (!) 149  Resp: (!) 27 (!) 21 (!) 25 (!) 26  Temp:  TempSrc:      SpO2: 96% 95% 96% 96%  Weight:      Height:        Intake/Output Summary (Last 24 hours) at 07/15/2017 1525 Last data filed at 07/15/2017 1415 Gross per 24 hour  Intake 492.67 ml  Output 225 ml  Net 267.67 ml   Filed Weights   07/12/17 1000 07/15/17 1315  Weight: 143 lb (64.9 kg) 135 lb 12.9 oz (61.6 kg)   Body mass index is 21.27 kg/m.  General:  Well nourished, well developed, in no acute distress HEENT: normal Lymph: no adenopathy Neck: no JVD Endocrine:  No thryomegaly Vascular: No carotid bruits; FA pulses 2+ bilaterally without bruits  Cardiac: irregularly irregular Lungs:  Decreased breath sounds, expiratory wheezing Abd: soft, nontender, no hepatomegaly  Ext: no edema Musculoskeletal:  No deformities, BUE and BLE strength normal and equal Skin: warm and dry  Neuro:  CNs 2-12 intact, no focal abnormalities noted Psych:  Normal affect   EKG:  The EKG was personally reviewed and demonstrates:  AF with VR 161 with diffuse ST depression Telemetry:  Telemetry was personally reviewed and demonstrates:  AF with RVR  Relevant CV Studies: Echo pending  Laboratory Data:  Chemistry Recent Labs  Lab 07/12/17 0605 07/13/17 0615 07/15/17 1238  NA 135 137 136  K 3.5 4.3 4.2  CL 100* 105 99*  CO2 25 24 26   GLUCOSE 180* 212* 306*  BUN 29* 30* 29*  CREATININE 0.96 0.98 0.79  CALCIUM 8.8* 8.8* 9.1  GFRNONAA 58* 56* >60  GFRAA >60 >60 >60  ANIONGAP 10 8 11     Recent Labs  Lab 07/11/17 1411  07/15/17 1238  PROT 6.9 6.7  ALBUMIN 3.9 3.7  AST 24 68*  ALT 25 102*  ALKPHOS 77 80  BILITOT 1.0 0.5   Hematology Recent Labs  Lab 07/12/17 0605 07/13/17 0615 07/15/17 1238  WBC 8.7 23.4* 21.2*  RBC 4.08 3.96 4.77  HGB 12.1 11.8* 14.3  HCT 36.8 36.7 43.7  MCV 90.2 92.7 91.6  MCH 29.7 29.8 30.0  MCHC 32.9 32.2 32.7  RDW 14.0 14.2 14.3  PLT 308 322 357   Cardiac Enzymes Recent Labs  Lab 07/15/17 1238  TROPONINI <0.03   No results for input(s): TROPIPOC in the last 168 hours.  BNPNo results for input(s): BNP, PROBNP in the last 168 hours.  DDimer No results for input(s): DDIMER in the last 168 hours.  Radiology/Studies:  No results found.  Assessment and Plan:   AF with RVR Apparently new onset- her last EKG was Feb 2018 and Diane Patterson is currently unaware Diane Patterson is in AF except for weakness.   COPD with exacerbation Significant COPD, on home O2  Hypertension, now hypotensive Hold IV Diltiazem for but plan to resume at a lower dose when B/P stabilizes. We may have to use Amiodarone short term.  TSH low 0.30. Will re check with free T4   Plan: IV NS bolus now. Resume IV Diltiazem when systolic B/P 716. IV Heparin started by primary service, Diane Patterson will probably need an oral anticoagulant at discharge. Check TSH and free T4.    For questions or updates, please contact [place] Please consult [email or web address] for contact info under Cardiology/STEMI.   Signed, Kerin Ransom, PA-C  07/15/2017 3:25 PM   I have personally seen and examined this patient. I agree with the assessment and plan as outlined above.  Diane Patterson has new onset atrial fibrillation in the setting of COPD exacerbation. Breathing improved  during hospitalization but acutely dyspneic this am before being discharged. HR noted to be in the 180s. EKG obtained and pt noted to be in atrial fib with RVR.  Transferred to ICU and placed on cardizem drip. Good control of HR but at 20 mg/hour her BP fell into the 70s. The  Cardizem drip was paused and Diane Patterson was given a fluid bolus.  Diane Patterson is feeling well now. HR is 120-130s.  No chest pain or dyspnea. No awareness of palpitations.  My exam: General: Well developed, well nourished, NAD  HEENT: OP clear, mucus membranes moist  SKIN: warm, dry. No rashes. Neuro: No focal deficits  Musculoskeletal: Muscle strength 5/5 all ext  Psychiatric: Mood and affect normal  Neck: No JVD, no carotid bruits, no thyromegaly, no lymphadenopathy.  Lungs:Wheezes bilaterally Cardiovascular: Irreg irreg. No murmurs, gallops or rubs. Abdomen:Soft. Bowel sounds present. Non-tender.  Extremities: No lower extremity edema. Pulses are 2 + in the bilateral DP/PT.  1. Atrial fib with RVR: HR is still in the 120s-130s while Cardizem drip has been paused. BP is 130/70s. Will resume Cardizem drip at lower rate. Would start at 5 mg/hour. Continue IV heparin. Echo pending. CHADS VASC score of 3. Diane Patterson will need consideration for long term anti-coagulation with Xarelto or Eliquis before discharge.  We will follow up in the am.   Lauree Chandler 07/15/2017 4:01 PM

## 2017-07-15 NOTE — Progress Notes (Signed)
Date: July 15, 2017 Discharge orders review for case management needs.  [facility] through [facility] Per patient or family member no additional needs at home. Velva Harman, BSN, [place], CCM:  [phone]

## 2017-07-16 DIAGNOSIS — I4891 Unspecified atrial fibrillation: Secondary | ICD-10-CM

## 2017-07-16 DIAGNOSIS — J181 Lobar pneumonia, unspecified organism: Secondary | ICD-10-CM

## 2017-07-16 LAB — CBC
HEMATOCRIT: 42.3 % (ref 36.0–46.0)
Hemoglobin: 13.8 g/dL (ref 12.0–15.0)
MCH: 30.1 pg (ref 26.0–34.0)
MCHC: 32.6 g/dL (ref 30.0–36.0)
MCV: 92.2 fL (ref 78.0–100.0)
Platelets: 349 10*3/uL (ref 150–400)
RBC: 4.59 MIL/uL (ref 3.87–5.11)
RDW: 14.6 % (ref 11.5–15.5)
WBC: 19.9 10*3/uL — ABNORMAL HIGH (ref 4.0–10.5)

## 2017-07-16 LAB — BASIC METABOLIC PANEL
Anion gap: 8 (ref 5–15)
BUN: 29 mg/dL — ABNORMAL HIGH (ref 6–20)
CO2: 29 mmol/L (ref 22–32)
Calcium: 9.1 mg/dL (ref 8.9–10.3)
Chloride: 100 mmol/L — ABNORMAL LOW (ref 101–111)
Creatinine, Ser: 0.75 mg/dL (ref 0.44–1.00)
GFR calc non Af Amer: >60 mL/min (ref >60)
Glucose, Bld: 276 mg/dL — ABNORMAL HIGH (ref 65–99)
Potassium: 4.1 mmol/L (ref 3.5–5.1)
Sodium: 137 mmol/L (ref 135–145)

## 2017-07-16 LAB — CULTURE, BLOOD (ROUTINE X 2)
CULTURE: NO GROWTH
Culture: NO GROWTH
SPECIAL REQUESTS: ADEQUATE
Special Requests: ADEQUATE

## 2017-07-16 LAB — TSH: TSH: 0.591 u[IU]/mL (ref 0.350–4.500)

## 2017-07-16 LAB — T4, FREE: Free T4: 1.06 ng/dL (ref 0.61–1.12)

## 2017-07-16 LAB — HEPARIN LEVEL (UNFRACTIONATED): Heparin Unfractionated: 1.12 IU/mL — ABNORMAL HIGH (ref 0.30–0.70)

## 2017-07-16 MED ORDER — DILTIAZEM HCL ER COATED BEADS 120 MG PO CP24
240.0000 mg | ORAL_CAPSULE | Freq: Every day | ORAL | Status: DC
  Administered 2017-07-16: 240 mg via ORAL
  Filled 2017-07-16: qty 2

## 2017-07-16 MED ORDER — PREDNISONE 10 MG PO TABS: ORAL_TABLET | ORAL | 0 refills | Status: DC

## 2017-07-16 MED ORDER — IPRATROPIUM-ALBUTEROL 0.5-2.5 (3) MG/3ML IN SOLN: 3.0000 mL | Freq: Four times a day (QID) | RESPIRATORY_TRACT | 0 refills | Status: DC | PRN

## 2017-07-16 MED ORDER — APIXABAN 5 MG PO TABS
5.0000 mg | ORAL_TABLET | Freq: Two times a day (BID) | ORAL | Status: DC
  Administered 2017-07-16: 5 mg via ORAL
  Filled 2017-07-16: qty 1

## 2017-07-16 MED ORDER — BUDESONIDE-FORMOTEROL FUMARATE 160-4.5 MCG/ACT IN AERO: 2.0000 | INHALATION_SPRAY | Freq: Two times a day (BID) | RESPIRATORY_TRACT | 0 refills | Status: DC

## 2017-07-16 MED ORDER — PREDNISONE 20 MG PO TABS
40.0000 mg | ORAL_TABLET | Freq: Every day | ORAL | Status: DC
  Administered 2017-07-16: 40 mg via ORAL
  Filled 2017-07-16: qty 2

## 2017-07-16 MED ORDER — POTASSIUM CHLORIDE CRYS ER 20 MEQ PO TBCR: 20.0000 meq | EXTENDED_RELEASE_TABLET | Freq: Every day | ORAL | 11 refills | Status: DC

## 2017-07-16 MED ORDER — HYDROCHLOROTHIAZIDE 12.5 MG PO CAPS: 25.0000 mg | ORAL_CAPSULE | Freq: Every day | ORAL | 0 refills | Status: DC

## 2017-07-16 MED ORDER — METHOCARBAMOL 500 MG PO TABS: 500.0000 mg | ORAL_TABLET | Freq: Three times a day (TID) | ORAL | 0 refills | Status: DC | PRN

## 2017-07-16 MED ORDER — POTASSIUM CHLORIDE CRYS ER 20 MEQ PO TBCR
20.0000 meq | EXTENDED_RELEASE_TABLET | Freq: Every day | ORAL | Status: DC
  Administered 2017-07-16: 20 meq via ORAL
  Filled 2017-07-16: qty 1

## 2017-07-16 MED ORDER — LORATADINE 10 MG PO TABS: 10.0000 mg | ORAL_TABLET | Freq: Every day | ORAL | 0 refills | Status: DC

## 2017-07-16 MED ORDER — HEPARIN (PORCINE) IN NACL 100-0.45 UNIT/ML-% IJ SOLN
850.0000 [IU]/h | INTRAMUSCULAR | Status: DC
  Administered 2017-07-16: 850 [IU]/h via INTRAVENOUS

## 2017-07-16 MED ORDER — APIXABAN 5 MG PO TABS: 5.0000 mg | ORAL_TABLET | Freq: Two times a day (BID) | ORAL | 0 refills | Status: DC

## 2017-07-16 MED ORDER — HYDROCHLOROTHIAZIDE 12.5 MG PO CAPS
25.0000 mg | ORAL_CAPSULE | Freq: Every day | ORAL | Status: DC
  Administered 2017-07-16: 25 mg via ORAL
  Filled 2017-07-16: qty 2

## 2017-07-16 MED ORDER — DM-GUAIFENESIN ER 30-600 MG PO TB12: 2.0000 | ORAL_TABLET | Freq: Two times a day (BID) | ORAL | 0 refills | Status: DC

## 2017-07-16 MED ORDER — ALBUTEROL SULFATE HFA 108 (90 BASE) MCG/ACT IN AERS: 2.0000 | INHALATION_SPRAY | RESPIRATORY_TRACT | 0 refills | Status: DC | PRN

## 2017-07-16 MED ORDER — HYDROCHLOROTHIAZIDE 12.5 MG PO CAPS: 12.5000 mg | ORAL_CAPSULE | Freq: Every day | ORAL | Status: DC

## 2017-07-16 MED ORDER — DILTIAZEM HCL ER COATED BEADS 240 MG PO CP24: 240.0000 mg | ORAL_CAPSULE | Freq: Every day | ORAL | 0 refills | Status: DC

## 2017-07-16 MED ORDER — CEFUROXIME AXETIL 500 MG PO TABS: 500.0000 mg | ORAL_TABLET | Freq: Two times a day (BID) | ORAL | 0 refills | Status: DC

## 2017-07-16 NOTE — Progress Notes (Signed)
Progress Note  Patient Name: Diane Patterson Date of Encounter: 07/16/2017  Primary Cardiologist:   Candee Furbish, MD   Subjective   The patient converted back to NSR  Inpatient Medications    Scheduled Meds: . apixaban  5 mg Oral BID  . azelastine  2 spray Each Nare Daily   And  . fluticasone  2 spray Each Nare Daily  . budesonide (PULMICORT) nebulizer solution  0.5 mg Nebulization BID  . cefUROXime  500 mg Oral BID WC  . dextromethorphan-guaiFENesin  2 tablet Oral BID  . diltiazem  240 mg Oral Daily  . hydrochlorothiazide  25 mg Oral Daily  . ipratropium  0.5 mg Nebulization TID   And  . levalbuterol  0.63 mg Nebulization TID  . loratadine  10 mg Oral Daily  . pantoprazole  40 mg Oral Q0600  . polyethylene glycol  17 g Oral BID  . potassium chloride SA  20 mEq Oral Daily  . predniSONE  40 mg Oral Q breakfast  . senna-docusate  1 tablet Oral BID   Continuous Infusions: . sodium chloride 10 mL/hr at 07/13/17 0749  . diltiazem (CARDIZEM) infusion 5 mg/hr (07/15/17 1829)  . heparin 850 Units/hr (07/16/17 0307)   PRN Meds: acetaminophen **OR** acetaminophen, hydrALAZINE, levalbuterol, methocarbamol, metoprolol tartrate, ondansetron **OR** ondansetron (ZOFRAN) IV   Vital Signs    Vitals:   07/16/17 0400 07/16/17 0500 07/16/17 0600 07/16/17 0700  BP: (!) 210/52 (!) 171/46 (!) 110/46 (!) 144/90  Pulse: 60 (!) 101 63 89  Resp: 15 15 14 14   Temp: 98.1 F (36.7 C)     TempSrc: Oral     SpO2: 97% 96% 97% 98%  Weight:      Height:        Intake/Output Summary (Last 24 hours) at 07/16/2017 0730 Last data filed at 07/16/2017 0441 Gross per 24 hour  Intake 773.49 ml  Output 825 ml  Net -51.51 ml   Filed Weights   07/12/17 1000 07/15/17 1315  Weight: 143 lb (64.9 kg) 135 lb 12.9 oz (61.6 kg)    Telemetry    NSR - Personally Reviewed  ECG    NA - Personally Reviewed  Physical Exam   GEN: No acute distress.   Neck: No  JVD Cardiac: RRR, no murmurs,  rubs, or gallops.  Respiratory:   Decreased breath sounds GI: Soft, nontender, non-distended  MS: No  edema; No deformity. Neuro:  Nonfocal  Psych: Normal affect   Labs    Chemistry Recent Labs  Lab 07/11/17 1411  07/13/17 0615 07/15/17 1238 07/16/17 0327  NA 135   < > 137 136 137  K 3.3*   < > 4.3 4.2 4.1  CL 94*   < > 105 99* 100*  CO2 27   < > 24 26 29   GLUCOSE 153*   < > 212* 306* 276*  BUN 34*   < > 30* 29* 29*  CREATININE 1.04*   < > 0.98 0.79 0.75  CALCIUM 9.2   < > 8.8* 9.1 9.1  PROT 6.9  --   --  6.7  --   ALBUMIN 3.9  --   --  3.7  --   AST 24  --   --  68*  --   ALT 25  --   --  102*  --   ALKPHOS 77  --   --  80  --   BILITOT 1.0  --   --  0.5  --  GFRNONAA 52*   < > 56* >60 >60  GFRAA >60   < > >60 >60 >60  ANIONGAP 14   < > 8 11 8    < > = values in this interval not displayed.     Hematology Recent Labs  Lab 07/13/17 0615 07/15/17 1238 07/16/17 0327  WBC 23.4* 21.2* 19.9*  RBC 3.96 4.77 4.59  HGB 11.8* 14.3 13.8  HCT 36.7 43.7 42.3  MCV 92.7 91.6 92.2  MCH 29.8 30.0 30.1  MCHC 32.2 32.7 32.6  RDW 14.2 14.3 14.6  PLT 322 357 349    Cardiac Enzymes Recent Labs  Lab 07/15/17 1238  TROPONINI <0.03   No results for input(s): TROPIPOC in the last 168 hours.   BNPNo results for input(s): BNP, PROBNP in the last 168 hours.   DDimer No results for input(s): DDIMER in the last 168 hours.   Radiology    No results found.  Cardiac Studies   ECHO:     Study Conclusions  - Procedure narrative: Transthoracic echocardiography. Technically   difficult study with suboptimal image quality. - Left ventricle: The cavity size was normal. Wall thickness was   increased in a pattern of mild LVH. Systolic function was   vigorous. The estimated ejection fraction was in the range of 65%   to 70%.  Impressions:  - Technically difficult study. Hyperdynamic LV systolic function   with LVEF of 65-70%, mild LVH, normal LA size.  Patient Profile       72 y.o. female is a 72 y.o. female with a hx of COPD who is being seen for the evaluation of AF with RVR at the request of Dr Sloan Leiter.   Assessment & Plan    ATRIAL FIB:  [name] has a CHA2DS2 - VASc score of 3.  Now NSR.  Started Eliquis and PO Cardizem.  OK to discharge from a cardiac standpoint.    COPD:  OK to discharge per primary team.    For questions or updates, please contact [facility] Please consult [email or web address] for contact info under Cardiology/STEMI.   Signed, Minus Breeding, MD  07/16/2017, 7:30 AM

## 2017-07-16 NOTE — Progress Notes (Signed)
Instructed patient with discharge papers. Patient understood everything fully.

## 2017-07-16 NOTE — Progress Notes (Signed)
ANTICOAGULATION CONSULT NOTE - Follow Up Consult  Pharmacy Consult for Heparin Indication: atrial fibrillation  No Known Allergies  Patient Measurements: Height: 5\' 7"  (170.2 cm) Weight: 135 lb 12.9 oz (61.6 kg) IBW/kg (Calculated) : 61.6 Heparin Dosing Weight:   Vital Signs: Temp: 98 F (36.7 C) (02/02 0000) Temp Source: Oral (02/02 0000) BP: 174/45 (02/02 0000) Pulse Rate: 74 (02/02 0000)  Labs: Recent Labs    07/13/17 0615 07/15/17 1238 07/16/17 0014  HGB 11.8* 14.3  --   HCT 36.7 43.7  --   PLT 322 357  --   HEPARINUNFRC  --   --  1.12*  CREATININE 0.98 0.79  --   TROPONINI  --  <0.03  --     Estimated Creatinine Clearance: 61.8 mL/min (by C-G formula based on SCr of 0.79 mg/dL).   Medications:  Infusions:  . sodium chloride 10 mL/hr at 07/13/17 0749  . diltiazem (CARDIZEM) infusion 5 mg/hr (07/15/17 1829)  . heparin      Assessment: Patient with very high heparin level.   Lab had to be recollected, which resulted in delay of result.  No heparin issues per RN.  Goal of Therapy:  Heparin level 0.3-0.7 units/ml Monitor platelets by anticoagulation protocol: Yes   Plan:  Hold heparin ~13mins At 0230 resume heparin at 850 units/hr Recheck heparin level at [address], [place] 07/16/2017,1:54 AM

## 2017-07-16 NOTE — Progress Notes (Signed)
[place] for heparin Indication: atrial fibrillation with RVR  No Known Allergies  Patient Measurements: Height: 5\' 7"  (170.2 cm) Weight: 135 lb 12.9 oz (61.6 kg) IBW/kg (Calculated) : 61.6 Heparin Dosing Weight: 65 kg  Vital Signs: Temp: 98.1 F (36.7 C) (02/02 0400) Temp Source: Oral (02/02 0400) BP: 144/90 (02/02 0700) Pulse Rate: 89 (02/02 0700)  Labs: Recent Labs    07/15/17 1238 07/16/17 0014 07/16/17 0327  HGB 14.3  --  13.8  HCT 43.7  --  42.3  PLT 357  --  349  HEPARINUNFRC  --  1.12*  --   CREATININE 0.79  --  0.75  TROPONINI <0.03  --   --     Estimated Creatinine Clearance: 61.8 mL/min (by C-G formula based on SCr of 0.75 mg/dL).  Assessment: Patient is a 72 y.o F with hx COPD presented to the ED on 07/11/17 with c/o SOB and nausea.  To start heparin drip on 07/15/17 for afib with RVR.   Heparin level supratherapeutic (1.12) this AM - drip held x 1 hour then rate reduced to 850 units/hr  CBC: Hgb 13.8, Plts wnl  No bleeding reported  SCr 0.75, CrCl ~60 ml/min   Goal of Therapy:  Therapeutic anticoagulation, prevention of stroke/VTE Monitor platelets by anticoagulation protocol: Yes   Plan:  - DC IV heparin - Begin Eliquis 5mg  PO bid (age < 20, weight > 60kg, SCr < 1.5) - Monitor renal function, CBC, signs/syptoms of bleeding - Provide Eliquis education prior to discharge  Peggyann Juba, PharmD, BCPS Pager: [phone] 07/16/2017,7:19 AM

## 2017-07-16 NOTE — Care Management Important Message (Signed)
Important Message  Patient Details  Name: Diane Patterson MRN: [id number] Date of Birth: 20-Jan-1946   Medicare Important Message Given:  Yes    Erenest Rasher, RN 07/16/2017, 10:02 AM

## 2017-07-16 NOTE — Care Management Note (Addendum)
Case Management Note  Patient Details  Name: Diane Patterson MRN: [id number] Date of Birth: March 05, 1946  Subjective/Objective:    COPD exac, PNA, afib with RVR                Action/Plan: Spoke to pt and provided her with Eliquis 30 day free trial card. States she uses [facility] on [address]. They do have in stock. Will fax Rx to [facility]. Pt has oxygen, neb machine and RW in room. [facility] will deliver portable to her home. [place] arrange with [facility].   Contacted [facility] and copay for Eliquis is $25.00.   Expected Discharge Date:  07/16/17               Expected Discharge Plan:  [place]  In-House Referral:  NA  Discharge planning Services  CM Consult  Post Acute Care Choice:  Home Health Choice offered to:  Patient  DME Arranged:  Oxygen, Nebulizer machine, Walker rolling DME Agency:  [place] Arranged:  RN, PT [facility] Agency:  [place]  Status of Service:  Completed, signed off  If discussed at [place] of Stay Meetings, dates discussed:    Additional Comments:  Erenest Rasher, RN 07/16/2017, 9:31 AM

## 2017-07-16 NOTE — Discharge Summary (Signed)
PATIENT DETAILS Name: Diane Patterson Age: 72 y.o. Sex: female Date of Birth: Jul 29, 1945 MRN: [id number]. Admitting Physician: Barton Dubois, MD [id number], No Pcp Per  Admit Date: 07/11/2017 Discharge date: 07/16/2017  Recommendations for Outpatient Follow-up:  1. Follow up with PCP in 1-2 weeks 2. Please obtain BMP/CBC in one week 3. Repeat two-view chest x-ray in 4-6 weeks to document resolution of pneumonia. 4. Ensure outpatient follow up with cardiology 5. Please follow up on the following pending results: Blood cultures until final  Admitted From:  Home  Disposition: Home with home health services   Home Health: Yes  Equipment/Devices: Oxygen 2L  Discharge Condition: Stable  CODE STATUS: FULL CODE  Diet recommendation:  Heart Healthy   Brief Summary: See H&P, Labs, Consult and Test reports for all details in brief, Patient is a 72 y.o. female with history of COPD-chronic hypoxic respiratory failure on nocturnal O2-admitted to the hospitalist service for COPD exacerbation and pneumonia-slowly improving with steroids, bronchodilators and empiric antimicrobial therapy.  See below for further details.  Brief Hospital Course: COPD exacerbation: Much improved-hardly any wheezing this morning-think she is back to her baseline-managed with steroids, bronchodilators and empiric antimicrobial therapy.  Claims that at baseline she is just about able to ambulate at home-she is on home O2.  Since she is back to her baseline-we will taper steroids, continue her usual bronchodilator regimen and have her follow-up with pulmonology-have made her a follow-up appointment.   Community acquired pneumonia: Much improved-she is afebrile-continues to have leukocytosis but this is secondary to steroids.  She is clinically markedly better. Some lingering cough continues.  Managed with Ceftin and Zithromax-transition to cefuroxime on discharge.  Please repeat 2 view chest x-ray in 4-6  weeks.  Blood cultures continue to be negative, influenza PCR respiratory virus panel negative as well.  HIV nonreactive.  Leukocytosis: Secondary to steroids-although has pneumonia-she has improved clinically.  Repeat CBC in the outpatient setting  Afib with RVR: patient developed Afib with RVR on 2/1 on day on planned discharge-d/c was held-patient was trasnferred to SDU-started on cardizem infusion. Chads2vasc score of 3-so she was started on anticoagulation with heparin. Cardiology was consulted. She spontaneously converted back to NSR last night-will transition to oral cardiozem and discharge patient later today. She will be started on Eliquis.  Chronic hypoxic respiratory failure: Likely secondary to underlying COPD-resume home O2.  Acute kidney injury: Mild hemodynamically mediated kidney injury-resolved with supportive care.  Hypertension:  Controlled overnight-Cardizem has been continued-HCTZ has been resumed.  I have added hydralazine.  Further optimization will be deferred to the outpatient setting  Rhinitis/nasal congestion: Improving with antihistamines, Flonase and decongestant other supportive care  Procedures/Studies: None  Discharge Diagnoses:  Principal Problem:   CAP (community acquired pneumonia) Active Problems:   Constipation   COPD with acute exacerbation ([place])   Essential hypertension   Hyperlipidemia   AKI (acute kidney injury) ([place])   Atrial fibrillation with RVR ([place])   Discharge Instructions:  Activity:  As tolerated with Full fall precautions use walker/cane & assistance as needed   Discharge Instructions    Diet - low sodium heart healthy   Complete by:  As directed    Discharge instructions   Complete by:  As directed    Follow with Primary MD  Patient in 1 week   You had Pneumonia: Please ask your primary care practitioner to get a 2 view Chest X ray done in 4-6 weeks after hospital discharge   Please get a complete  blood count and  chemistry panel checked by your Primary MD at your next visit, and again as instructed by your Primary MD.  Get Medicines reviewed and adjusted: Please take all your medications with you for your next visit with your Primary MD  Laboratory/radiological data: Please request your Primary MD to go over all hospital tests and procedure/radiological results at the follow up, please ask your Primary MD to get all Hospital records sent to his/her office.  In some cases, they will be blood work, cultures and biopsy results pending at the time of your discharge. Please request that your primary care M.D. follows up on these results.  Also Note the following: If you experience worsening of your admission symptoms, develop shortness of breath, life threatening emergency, suicidal or homicidal thoughts you must seek medical attention immediately by calling 911 or calling your MD immediately  if symptoms less severe.  You must read complete instructions/literature along with all the possible adverse reactions/side effects for all the Medicines you take and that have been prescribed to you. Take any new Medicines after you have completely understood and accpet all the possible adverse reactions/side effects.   Do not drive when taking Pain medications or sleeping medications (Benzodaizepines)  Do not take more than prescribed Pain, Sleep and Anxiety Medications. It is not advisable to combine anxiety,sleep and pain medications without talking with your primary care practitioner  Special Instructions: If you have smoked or chewed Tobacco  in the last 2 yrs please stop smoking, stop any regular Alcohol  and or any Recreational drug use.  Wear Seat belts while driving.  Please note: You were cared for by a hospitalist during your hospital stay. Once you are discharged, your primary care physician will handle any further medical issues. Please note that NO REFILLS for any discharge medications will be  authorized once you are discharged, as it is imperative that you return to your primary care physician (or establish a relationship with a primary care physician if you do not have one) for your post hospital discharge needs so that they can reassess your need for medications and monitor your lab values.   Increase activity slowly   Complete by:  As directed      Allergies as of 07/16/2017   No Known Allergies     Medication List    STOP taking these medications   diltiazem 90 MG 12 hr capsule Commonly known as:  CARDIZEM SR   doxycycline 100 MG capsule Commonly known as:  VIBRAMYCIN     TAKE these medications   acetaminophen 325 MG tablet Commonly known as:  TYLENOL Take 650 mg by mouth every 6 (six) hours as needed for mild pain.   albuterol 108 (90 Base) MCG/ACT inhaler Commonly known as:  PROVENTIL HFA;VENTOLIN HFA Inhale 2 puffs into the lungs every 4 (four) hours as needed for wheezing or shortness of breath.   apixaban 5 MG Tabs tablet Commonly known as:  ELIQUIS Take 1 tablet (5 mg total) by mouth 2 (two) times daily.   Azelastine-Fluticasone 137-50 MCG/ACT Susp Place 2 sprays into the nose once. What changed:  when to take this   budesonide-formoterol 160-4.5 MCG/ACT inhaler Commonly known as:  SYMBICORT Inhale 2 puffs into the lungs 2 (two) times daily.   cefUROXime 500 MG tablet Commonly known as:  CEFTIN Take 1 tablet (500 mg total) by mouth 2 (two) times daily with a meal.   dextromethorphan-guaiFENesin 30-600 MG 12hr tablet Commonly known as:  MUCINEX  DM Take 2 tablets by mouth 2 (two) times daily.   diltiazem 240 MG 24 hr capsule Commonly known as:  CARDIZEM CD Take 1 capsule (240 mg total) by mouth daily.   hydrochlorothiazide 12.5 MG capsule Commonly known as:  MICROZIDE Take 2 capsules (25 mg total) by mouth daily. What changed:  how much to take   ipratropium-albuterol 0.5-2.5 (3) MG/3ML Soln Commonly known as:  DUONEB Take 3 mLs by  nebulization every 6 (six) hours as needed.   loratadine 10 MG tablet Commonly known as:  CLARITIN Take 1 tablet (10 mg total) by mouth daily.   methocarbamol 500 MG tablet Commonly known as:  ROBAXIN Take 1 tablet (500 mg total) by mouth every 8 (eight) hours as needed for muscle spasms.   potassium chloride SA 20 MEQ tablet Commonly known as:  K-DUR,KLOR-CON Take 1 tablet (20 mEq total) by mouth daily.   predniSONE 10 MG tablet Commonly known as:  DELTASONE Take 4 tablets (40 mg) daily for 2 days, then, Take 3 tablets (30 mg) daily for 2 days, then, Take 2 tablets (20 mg) daily for 2 days, then, Take 1 tablets (10 mg) daily for 1 days, then stop What changed:    medication strength  how much to take  how to take this  when to take this  additional instructions            Durable Medical Equipment  (From admission, onward)        Start     Ordered   07/15/17 1110  For home use only DME Walker rolling  Once    Question:  Patient needs a walker to treat with the following condition  Answer:  Chronic respiratory failure with hypoxia ([place])   07/15/17 1110   07/15/17 1021  DME Oxygen  Once    Question Answer Comment  Mode or (Route) Nasal cannula   Liters per Minute 2   Frequency Continuous (stationary and portable oxygen unit needed)   Oxygen conserving device Yes   Oxygen delivery system Gas      07/15/17 1021   07/15/17 1017  For home use only DME Nebulizer machine  Once    Question:  Patient needs a nebulizer to treat with the following condition  Answer:  COPD (chronic obstructive pulmonary disease) ([place])   07/15/17 1017     Follow-up Information    Golden Circle, FNP. Schedule an appointment as soon as possible for a visit.   Specialties:  Family Medicine, Infectious Diseases Contact information: [address] [place] [address] [phone]        Melvenia Needles, NP Follow up on 07/25/2017.   Specialty:  Pulmonary  Disease Why:  appt at 4:15 pm Contact information: [address] [address] [phone]        Jerline Pain, MD. Schedule an appointment as soon as possible for a visit in 1 week(s).   Specialty:  Cardiology Contact information: [address] [place] [address] [phone]          No Known Allergies  Consultations:   None   Other Procedures/Studies: Dg Chest 2 View  Result Date: 07/11/2017 CLINICAL DATA:  Shortness of breath, nausea. Recently diagnosed with flu and exacerbation of COPD. Patient also reports back pain for the past 2 days since rolling over wrong in bed. Former smoker. EXAM: CHEST  2 VIEW COMPARISON:  Chest x-ray of June 16, 2015. FINDINGS:  The lungs are hyperinflated with hemidiaphragm flattening. The lung markings are coarse in the lower lobes. There new increased density in the retrocardiac region. The heart and pulmonary vascularity are normal. There calcification in the wall of the thoracic aorta. The bony thorax exhibits no acute abnormality. IMPRESSION: Increased retrocardiac density likely on the left is compatible with atelectasis or pneumonia. There is underlying COPD. Followup PA and lateral chest X-ray is recommended in 3-4 weeks following trial of antibiotic therapy to ensure resolution and exclude underlying malignancy. Thoracic aortic atherosclerosis. Electronically Signed   By: David  Martinique M.D.   On: 07/11/2017 14:38     TODAY-DAY OF DISCHARGE:  Subjective:   Ayah Cozzolino today has no headache,no chest abdominal pain,no new weakness tingling or numbness, feels much better wants to go home today.   Objective:   Blood pressure (!) 144/90, pulse 89, temperature 98.1 F (36.7 C), temperature source Oral, resp. rate 14, height 5\' 7"  (1.702 m), weight 61.6 kg (135 lb 12.9 oz), SpO2 98 %.  Intake/Output Summary (Last 24 hours) at 07/16/2017 0753 Last data filed at 07/16/2017 0441 Gross per 24 hour   Intake 773.49 ml  Output 825 ml  Net -51.51 ml   Filed Weights   07/12/17 1000 07/15/17 1315  Weight: 64.9 kg (143 lb) 61.6 kg (135 lb 12.9 oz)    Exam: Awake Alert, Oriented *3, No new F.N deficits, Normal affect [place].AT,PERRAL Supple Neck,No JVD, No cervical lymphadenopathy appriciated.  Symmetrical Chest wall movement, Good air movement bilaterally, CTAB RRR,No Gallops,Rubs or new Murmurs, No Parasternal Heave +ve B.Sounds, Abd Soft, Non tender, No organomegaly appriciated, No rebound -guarding or rigidity. No Cyanosis, Clubbing or edema, No new Rash or bruise   PERTINENT RADIOLOGIC STUDIES: Dg Chest 2 View  Result Date: 07/11/2017 CLINICAL DATA:  Shortness of breath, nausea. Recently diagnosed with flu and exacerbation of COPD. Patient also reports back pain for the past 2 days since rolling over wrong in bed. Former smoker. EXAM: CHEST  2 VIEW COMPARISON:  Chest x-ray of June 16, 2015. FINDINGS: The lungs are hyperinflated with hemidiaphragm flattening. The lung markings are coarse in the lower lobes. There new increased density in the retrocardiac region. The heart and pulmonary vascularity are normal. There calcification in the wall of the thoracic aorta. The bony thorax exhibits no acute abnormality. IMPRESSION: Increased retrocardiac density likely on the left is compatible with atelectasis or pneumonia. There is underlying COPD. Followup PA and lateral chest X-ray is recommended in 3-4 weeks following trial of antibiotic therapy to ensure resolution and exclude underlying malignancy. Thoracic aortic atherosclerosis. Electronically Signed   By: David  Martinique M.D.   On: 07/11/2017 14:38     PERTINENT LAB RESULTS: CBC: Recent Labs    07/15/17 1238 07/16/17 0327  WBC 21.2* 19.9*  HGB 14.3 13.8  HCT 43.7 42.3  PLT 357 349   CMET CMP     Component Value Date/Time   NA 137 07/16/2017 0327   NA 140 07/21/2016 1206   K 4.1 07/16/2017 0327   CL 100 (L) 07/16/2017 0327    CO2 29 07/16/2017 0327   GLUCOSE 276 (H) 07/16/2017 0327   BUN 29 (H) 07/16/2017 0327   BUN 14 07/21/2016 1206   CREATININE 0.75 07/16/2017 0327   CALCIUM 9.1 07/16/2017 0327   PROT 6.7 07/15/2017 1238   ALBUMIN 3.7 07/15/2017 1238   AST 68 (H) 07/15/2017 1238   ALT 102 (H) 07/15/2017 1238   ALKPHOS 80 07/15/2017 1238  BILITOT 0.5 07/15/2017 1238   GFRNONAA >60 07/16/2017 0327   GFRAA >60 07/16/2017 0327    GFR Estimated Creatinine Clearance: 61.8 mL/min (by C-G formula based on SCr of 0.75 mg/dL). No results for input(s): LIPASE, AMYLASE in the last 72 hours. Recent Labs    07/15/17 1238  TROPONINI <0.03   Invalid input(s): POCBNP No results for input(s): DDIMER in the last 72 hours. No results for input(s): HGBA1C in the last 72 hours. No results for input(s): CHOL, HDL, LDLCALC, TRIG, CHOLHDL, LDLDIRECT in the last 72 hours. Recent Labs    07/16/17 0327  TSH 0.591   No results for input(s): VITAMINB12, FOLATE, FERRITIN, TIBC, IRON, RETICCTPCT in the last 72 hours. Coags: No results for input(s): INR in the last 72 hours.  Invalid input(s): PT Microbiology: Recent Results (from the past 240 hour(s))  Culture, blood (routine x 2) Call MD if unable to obtain prior to antibiotics being given     Status: None (Preliminary result)   Collection Time: 07/11/17  7:32 PM  Result Value Ref Range Status   Specimen Description   Final    BLOOD RIGHT HAND Performed at [facility], [place] [address] [place], [place] [address]    Special Requests   Final    IN PEDIATRIC BOTTLE Blood Culture adequate volume Performed at [facility] [address], [place], [place] [address]    Culture   Final    NO GROWTH 4 DAYS Performed at [facility], [place] [address], [place], [place] [address]    Report Status PENDING  Incomplete  Culture, blood (routine x 2) Call MD if unable to obtain prior to antibiotics being given     Status: None  (Preliminary result)   Collection Time: 07/11/17  7:32 PM  Result Value Ref Range Status   Specimen Description   Final    BLOOD RIGHT ANTECUBITAL Performed at [facility] [address] [place], [place] [address]    Special Requests   Final    BOTTLES DRAWN AEROBIC ONLY Blood Culture adequate volume Performed at [facility] [address] [place], [place] [address]    Culture   Final    NO GROWTH 4 DAYS Performed at [facility], [place] [address], [place], [place] [address]    Report Status PENDING  Incomplete  Respiratory Panel by PCR     Status: None   Collection Time: 07/11/17  7:34 PM  Result Value Ref Range Status   Adenovirus NOT DETECTED NOT DETECTED Final   Coronavirus 229E NOT DETECTED NOT DETECTED Final   Coronavirus HKU1 NOT DETECTED NOT DETECTED Final   Coronavirus NL63 NOT DETECTED NOT DETECTED Final   Coronavirus OC43 NOT DETECTED NOT DETECTED Final   Metapneumovirus NOT DETECTED NOT DETECTED Final   Rhinovirus / Enterovirus NOT DETECTED NOT DETECTED Final   Influenza A NOT DETECTED NOT DETECTED Final   Influenza B NOT DETECTED NOT DETECTED Final   Parainfluenza Virus 1 NOT DETECTED NOT DETECTED Final   Parainfluenza Virus 2 NOT DETECTED NOT DETECTED Final   Parainfluenza Virus 3 NOT DETECTED NOT DETECTED Final   Parainfluenza Virus 4 NOT DETECTED NOT DETECTED Final   Respiratory Syncytial Virus NOT DETECTED NOT DETECTED Final   Bordetella pertussis NOT DETECTED NOT DETECTED Final   Chlamydophila pneumoniae NOT DETECTED NOT DETECTED Final   Mycoplasma pneumoniae NOT DETECTED NOT DETECTED Final    Comment: Performed at [facility], [address] [place], [place] [address]  MRSA PCR Screening     Status: None   Collection Time: 07/15/17  1:20 PM  Result Value Ref Range Status   MRSA by PCR NEGATIVE NEGATIVE Final    Comment:        The GeneXpert MRSA Assay (FDA approved for NASAL specimens only), is one  component of a comprehensive MRSA colonization surveillance program. It is not intended to diagnose MRSA infection nor to guide or monitor treatment for MRSA infections. Performed at [facility], [place] [address] [place], [place] [address]     FURTHER DISCHARGE INSTRUCTIONS:  Get Medicines reviewed and adjusted: Please take all your medications with you for your next visit with your Primary MD  Laboratory/radiological data: Please request your Primary MD to go over all hospital tests and procedure/radiological results at the follow up, please ask your Primary MD to get all Hospital records sent to his/her office.  In some cases, they will be blood work, cultures and biopsy results pending at the time of your discharge. Please request that your primary care M.D. goes through all the records of your hospital data and follows up on these results.  Also Note the following: If you experience worsening of your admission symptoms, develop shortness of breath, life threatening emergency, suicidal or homicidal thoughts you must seek medical attention immediately by calling 911 or calling your MD immediately  if symptoms less severe.  You must read complete instructions/literature along with all the possible adverse reactions/side effects for all the Medicines you take and that have been prescribed to you. Take any new Medicines after you have completely understood and accpet all the possible adverse reactions/side effects.   Do not drive when taking Pain medications or sleeping medications (Benzodaizepines)  Do not take more than prescribed Pain, Sleep and Anxiety Medications. It is not advisable to combine anxiety,sleep and pain medications without talking with your primary care practitioner  Special Instructions: If you have smoked or chewed Tobacco  in the last 2 yrs please stop smoking, stop any regular Alcohol  and or any Recreational drug use.  Wear Seat belts while  driving.  Please note: You were cared for by a hospitalist during your hospital stay. Once you are discharged, your primary care physician will handle any further medical issues. Please note that NO REFILLS for any discharge medications will be authorized once you are discharged, as it is imperative that you return to your primary care physician (or establish a relationship with a primary care physician if you do not have one) for your post hospital discharge needs so that they can reassess your need for medications and monitor your lab values.  Total Time spent coordinating discharge including counseling, education and face to face time equals 45 minutes.  SignedOren Binet 07/16/2017 7:53 AM

## 2017-07-19 ENCOUNTER — Telehealth: Payer: Self-pay | Admitting: Emergency Medicine

## 2017-07-19 NOTE — Telephone Encounter (Signed)
Called and spoke with Verdis Frederickson from [facility]. She states that patients PCP retired and she was just in the hospital for pneumonia and COPD. They are needing a plan of care signed by RB if he is willing stating that they can follow the pt during both the pneumonia and COPD. Once we get an ok from [place] they will send over the fax.  RB please advise.

## 2017-07-19 NOTE — Telephone Encounter (Signed)
Yes this is okay with me.

## 2017-07-19 NOTE — Telephone Encounter (Signed)
Verdis Frederickson returned call, CB is [phone].

## 2017-07-19 NOTE — Telephone Encounter (Signed)
Called and spoke with Diane Patterson. She will be sending over the fax for [place] to sign for Plan of Care. Will route this to [place] to follow up on.   Ria Comment please be on the look out.

## 2017-07-19 NOTE — Telephone Encounter (Signed)
Left message for Diane Patterson to call back.

## 2017-07-21 NOTE — Telephone Encounter (Signed)
Form has still not been received. Please contact DME to have this faxed again. Thanks.

## 2017-07-21 NOTE — Telephone Encounter (Signed)
Spoke with Diane Patterson.   She stated that form has been mailed to our office instead of being faxed over.

## 2017-07-25 ENCOUNTER — Ambulatory Visit (INDEPENDENT_AMBULATORY_CARE_PROVIDER_SITE_OTHER)
Admission: RE | Admit: 2017-07-25 | Discharge: 2017-07-25 | Disposition: A | Discharge status: Home / Self Care | Payer: Medicare Other | Source: Ambulatory Visit | Attending: Adult Health | Admitting: Adult Health

## 2017-07-25 ENCOUNTER — Ambulatory Visit: Payer: Medicare Other | Admitting: Adult Health

## 2017-07-25 ENCOUNTER — Encounter: Payer: Self-pay | Admitting: Adult Health

## 2017-07-25 VITALS — BP 138/80 | HR 66 | Ht 67.0 in | Wt 137.0 lb

## 2017-07-25 DIAGNOSIS — J9611 Chronic respiratory failure with hypoxia: Secondary | ICD-10-CM | POA: Diagnosis not present

## 2017-07-25 DIAGNOSIS — J441 Chronic obstructive pulmonary disease with (acute) exacerbation: Secondary | ICD-10-CM | POA: Diagnosis not present

## 2017-07-25 DIAGNOSIS — J181 Lobar pneumonia, unspecified organism: Secondary | ICD-10-CM

## 2017-07-25 DIAGNOSIS — J189 Pneumonia, unspecified organism: Secondary | ICD-10-CM

## 2017-07-25 MED ORDER — BUDESONIDE-FORMOTEROL FUMARATE 160-4.5 MCG/ACT IN AERO: 2.0000 | INHALATION_SPRAY | Freq: Two times a day (BID) | RESPIRATORY_TRACT | 5 refills | Status: DC

## 2017-07-25 MED ORDER — AZELASTINE-FLUTICASONE 137-50 MCG/ACT NA SUSP: 2.0000 | Freq: Every day | NASAL | 5 refills | Status: DC

## 2017-07-25 NOTE — Assessment & Plan Note (Signed)
Recent exacerbation now improving  Plan  Patient Instructions  Continue on Symbicort 2 puffs Twice daily  , rinse after use.  Use Albuterol inhaler or neb As needed  .  Mucinex DM Twice daily  As needed cough /congestion .  Chest xray today .  Continue on Oxygen 2l/m  Follow up Dr. Lamonte Sakai  In 3-4 weeks and As needed   Please contact office for sooner follow up if symptoms do not improve or worsen or seek emergency care   Establish with new Primary MD .

## 2017-07-25 NOTE — Progress Notes (Signed)
@Patient  ID: Diane Patterson, female    DOB: 02/12/1946, 72 y.o.   MRN: [id number]  Chief Complaint  Patient presents with  . Follow-up    COPD     Referring provider: No ref. provider found  HPI: 72 year old female former smoker followed for chronic obstructive asthma and GOLD IV COPD  TEST  PFT 2009, shows very severe obstruction with an FEV1 of approximately 0.73 L, 27%.  2016 FEV1 32%.   07/25/2017 Follow up  ;COPD /Asthma  Patient returns for a post hospital follow-up.  She was admitted last week for COPD exacerbation with community-acquired pneumonia.  She was treated with IV antibiotics steroids and nebulized  bronchodilators.  Hospitalization was complicated by A. fib with RVR.  She was seen by cardiology.  Started on Cardizem and Eliquis.  She has a follow-up with cardiology soon.  She says that since discharge she is feeling better.  She is got decreased cough and shortness of breath.  She has finished her antibiotics.  She denies any hemoptysis chest pain orthopnea or PND.  She is on oxygen.   No Known Allergies  Immunization History  Administered Date(s) Administered  . Influenza Split 03/15/2011, 03/06/2012  . Influenza Whole 03/12/2009, 02/10/2010  . Influenza, High Dose Seasonal PF 04/01/2015, 03/05/2016, 03/18/2017  . Influenza,inj,Quad PF,6+ Mos 03/30/2013, 03/11/2014, 03/19/2017  . Pneumococcal Conjugate-13 11/14/2013  . Pneumococcal Polysaccharide-23 09/11/2010    Past Medical History:  Diagnosis Date  . Asthma   . Chronic airflow obstruction (HCC)   . Hypertension   . Tobacco abuse   . Venous insufficiency     Tobacco History: Social History   Tobacco Use  Smoking Status Former Smoker  . Packs/day: 0.75  . Years: 15.00  . Pack years: 11.25  . Types: Cigarettes  . Last attempt to quit: 03/20/2014  . Years since quitting: 3.3  Smokeless Tobacco Former Systems developer  . Quit date: 03/11/2014   Counseling given: Not Answered   Outpatient  Encounter Medications as of 07/25/2017  Medication Sig  . acetaminophen (TYLENOL) 325 MG tablet Take 650 mg by mouth every 6 (six) hours as needed for mild pain.   Marland Kitchen albuterol (PROVENTIL HFA;VENTOLIN HFA) 108 (90 Base) MCG/ACT inhaler Inhale 2 puffs into the lungs every 4 (four) hours as needed for wheezing or shortness of breath.  Marland Kitchen apixaban (ELIQUIS) 5 MG TABS tablet Take 1 tablet (5 mg total) by mouth 2 (two) times daily.  . Azelastine-Fluticasone 137-50 MCG/ACT SUSP Place 2 sprays into the nose daily.  . budesonide-formoterol (SYMBICORT) 160-4.5 MCG/ACT inhaler Inhale 2 puffs into the lungs 2 (two) times daily.  Marland Kitchen dextromethorphan-guaiFENesin (MUCINEX DM) 30-600 MG 12hr tablet Take 2 tablets by mouth 2 (two) times daily.  Marland Kitchen diltiazem (CARDIZEM CD) 240 MG 24 hr capsule Take 1 capsule (240 mg total) by mouth daily.  . hydrochlorothiazide (MICROZIDE) 12.5 MG capsule Take 2 capsules (25 mg total) by mouth daily.  Marland Kitchen ipratropium-albuterol (DUONEB) 0.5-2.5 (3) MG/3ML SOLN Take 3 mLs by nebulization every 6 (six) hours as needed.  . loratadine (CLARITIN) 10 MG tablet Take 1 tablet (10 mg total) by mouth daily.  . methocarbamol (ROBAXIN) 500 MG tablet Take 1 tablet (500 mg total) by mouth every 8 (eight) hours as needed for muscle spasms.  . potassium chloride SA (K-DUR,KLOR-CON) 20 MEQ tablet Take 1 tablet (20 mEq total) by mouth daily.  . [DISCONTINUED] Azelastine-Fluticasone 137-50 MCG/ACT SUSP Place 2 sprays into the nose once. (Patient taking differently: Place 2 sprays into  the nose daily. )  . [DISCONTINUED] budesonide-formoterol (SYMBICORT) 160-4.5 MCG/ACT inhaler Inhale 2 puffs into the lungs 2 (two) times daily.  . [DISCONTINUED] cefUROXime (CEFTIN) 500 MG tablet Take 1 tablet (500 mg total) by mouth 2 (two) times daily with a meal. (Patient not taking: Reported on 07/25/2017)  . [DISCONTINUED] predniSONE (DELTASONE) 10 MG tablet Take 4 tablets (40 mg) daily for 2 days, then, Take 3 tablets (30  mg) daily for 2 days, then, Take 2 tablets (20 mg) daily for 2 days, then, Take 1 tablets (10 mg) daily for 1 days, then stop (Patient not taking: Reported on 07/25/2017)   No facility-administered encounter medications on file as of 07/25/2017.      Review of Systems  Constitutional:   No  weight loss, night sweats,  Fevers, chills,  +fatigue, or  lassitude.  HEENT:   No headaches,  Difficulty swallowing,  Tooth/dental problems, or  Sore throat,                No sneezing, itching, ear ache, nasal congestion, post nasal drip,   CV:  No chest pain,  Orthopnea, PND, swelling in lower extremities, anasarca, dizziness, palpitations, syncope.   GI  No heartburn, indigestion, abdominal pain, nausea, vomiting, diarrhea, change in bowel habits, loss of appetite, bloody stools.   Resp:   No chest wall deformity  Skin: no rash or lesions.  GU: no dysuria, change in color of urine, no urgency or frequency.  No flank pain, no hematuria   MS:  No joint pain or swelling.  No decreased range of motion.  No back pain.    Physical Exam  BP 138/80   Pulse 66   Ht 5\' 7"  (1.702 m)   Wt 137 lb (62.1 kg)   LMP  (LMP Unknown)   SpO2 99%   BMI 21.46 kg/m   GEN: A/Ox3; pleasant , NAD, elderly on O2   HEENT:  [place]/AT,  EACs-clear, TMs-wnl, NOSE-clear, THROAT-clear, no lesions, no postnasal drip or exudate noted.   NECK:  Supple w/ fair ROM; no JVD; normal carotid impulses w/o bruits; no thyromegaly or nodules palpated; no lymphadenopathy.    RESP decreased breath sounds in the bases   no accessory muscle use, no dullness to percussion  CARD:  RRR, no m/r/g, tr peripheral edema, pulses intact, no cyanosis or clubbing.  GI:   Soft & nt; nml bowel sounds; no organomegaly or masses detected.   Musco: Warm bil, no deformities or joint swelling noted.   Neuro: alert, no focal deficits noted.    Skin: Warm, no lesions or rashes    Lab Results:  BMET  BNP No results found for:  BNP  ProBNP  Imaging: Dg Chest 2 View  Result Date: 07/25/2017 CLINICAL DATA:  Cough, shortness of breath. EXAM: CHEST  2 VIEW COMPARISON:  Radiographs of July 11, 2017. FINDINGS: The heart size and mediastinal contours are within normal limits. No pneumothorax or pleural effusion is noted. Emphysematous disease is noted in the upper lobes bilaterally. Atherosclerosis of thoracic aorta is noted. No acute pulmonary disease is noted. The visualized skeletal structures are unremarkable. IMPRESSION: No acute abnormality is noted. Aortic Atherosclerosis (ICD10-I70.0) and Emphysema (ICD10-J43.9). Electronically Signed   By: Marijo Conception, M.D.   On: 07/25/2017 17:12   Dg Chest 2 View  Result Date: 07/11/2017 CLINICAL DATA:  Shortness of breath, nausea. Recently diagnosed with flu and exacerbation of COPD. Patient also reports back pain for the past 2 days since  rolling over wrong in bed. Former smoker. EXAM: CHEST  2 VIEW COMPARISON:  Chest x-ray of June 16, 2015. FINDINGS: The lungs are hyperinflated with hemidiaphragm flattening. The lung markings are coarse in the lower lobes. There new increased density in the retrocardiac region. The heart and pulmonary vascularity are normal. There calcification in the wall of the thoracic aorta. The bony thorax exhibits no acute abnormality. IMPRESSION: Increased retrocardiac density likely on the left is compatible with atelectasis or pneumonia. There is underlying COPD. Followup PA and lateral chest X-ray is recommended in 3-4 weeks following trial of antibiotic therapy to ensure resolution and exclude underlying malignancy. Thoracic aortic atherosclerosis. Electronically Signed   By: David  Martinique M.D.   On: 07/11/2017 14:38     Assessment & Plan:   COPD with acute exacerbation ([place]) Recent exacerbation now improving  Plan  Patient Instructions  Continue on Symbicort 2 puffs Twice daily  , rinse after use.  Use Albuterol inhaler or neb As needed  .   Mucinex DM Twice daily  As needed cough /congestion .  Chest xray today .  Continue on Oxygen 2l/m  Follow up Dr. Lamonte Sakai  In 3-4 weeks and As needed   Please contact office for sooner follow up if symptoms do not improve or worsen or seek emergency care   Establish with new Primary MD .      Chronic respiratory failure with hypoxia ([place]) Continue on oxygen  CAP (community acquired pneumonia) Clinically improving on antibiotics.  Check chest x-ray today  Plan  Patient Instructions  Continue on Symbicort 2 puffs Twice daily  , rinse after use.  Use Albuterol inhaler or neb As needed  .  Mucinex DM Twice daily  As needed cough /congestion .  Chest xray today .  Continue on Oxygen 2l/m  Follow up Dr. Lamonte Sakai  In 3-4 weeks and As needed   Please contact office for sooner follow up if symptoms do not improve or worsen or seek emergency care   Establish with new Primary MD .         Rexene Edison, NP 07/25/2017

## 2017-07-25 NOTE — Patient Instructions (Addendum)
Continue on Symbicort 2 puffs Twice daily  , rinse after use.  Use Albuterol inhaler or neb As needed  .  Mucinex DM Twice daily  As needed cough /congestion .  Chest xray today .  Continue on Oxygen 2l/m  Follow up Dr. Lamonte Sakai  In 3-4 weeks and As needed   Please contact office for sooner follow up if symptoms do not improve or worsen or seek emergency care   Establish with new Primary MD .

## 2017-07-25 NOTE — Assessment & Plan Note (Signed)
Clinically improving on antibiotics.  Check chest x-ray today  Plan  Patient Instructions  Continue on Symbicort 2 puffs Twice daily  , rinse after use.  Use Albuterol inhaler or neb As needed  .  Mucinex DM Twice daily  As needed cough /congestion .  Chest xray today .  Continue on Oxygen 2l/m  Follow up Dr. Lamonte Sakai  In 3-4 weeks and As needed   Please contact office for sooner follow up if symptoms do not improve or worsen or seek emergency care   Establish with new Primary MD .

## 2017-07-25 NOTE — Assessment & Plan Note (Signed)
Continue on oxygen 

## 2017-07-28 ENCOUNTER — Telehealth: Payer: Self-pay | Admitting: Pulmonary Disease

## 2017-07-28 ENCOUNTER — Telehealth: Payer: Self-pay | Admitting: Emergency Medicine

## 2017-07-28 NOTE — Progress Notes (Signed)
LMOMTCB x 1 

## 2017-07-28 NOTE — Telephone Encounter (Signed)
Notes recorded by Melvenia Needles, NP on 07/28/2017 at 11:42 AM EST Chronic changes noted, PNA resolved  Cont w/ ov recs Please contact office for sooner follow up if symptoms do not improve or worsen or seek emergency care   Spoke with patient, she is aware of results. Nothing else needed at time of call.

## 2017-07-28 NOTE — Telephone Encounter (Signed)
Called Amy, left voicemail to call back.   RB ok to authorize PT twice a week for 3 weeks?

## 2017-07-28 NOTE — Telephone Encounter (Signed)
Yes this is Ok 

## 2017-07-28 NOTE — Telephone Encounter (Signed)
Left a detailed message for Amy advising order for PT was approved by RB. Nothing further is needed,

## 2017-08-03 ENCOUNTER — Telehealth: Payer: Self-pay | Admitting: Emergency Medicine

## 2017-08-03 NOTE — Telephone Encounter (Signed)
Spoke with Angie at [facility], VO given.  Nothing further needed.

## 2017-08-03 NOTE — Telephone Encounter (Signed)
Spoke with Angie, HHN at [facility], requesting 2 additional nurse home visits to patient for follow up on recent hospitalization for PNA and COPD.  RB ok to give verbal for additional nurse visits?

## 2017-08-03 NOTE — Telephone Encounter (Signed)
Yes please order

## 2017-08-05 ENCOUNTER — Telehealth: Payer: Self-pay | Admitting: Emergency Medicine

## 2017-08-05 MED ORDER — DM-GUAIFENESIN ER 30-600 MG PO TB12: 1.0000 | ORAL_TABLET | Freq: Two times a day (BID) | ORAL | 0 refills | Status: DC | PRN

## 2017-08-05 MED ORDER — PREDNISONE 10 MG PO TABS: ORAL_TABLET | ORAL | 0 refills | Status: DC

## 2017-08-05 NOTE — Progress Notes (Signed)
Cardiology Office Note   Date:  08/08/2017   ID:  Diane Patterson, Diane Patterson [birth year], MRN [id number]  PCP:  Orlena Sheldon, PA-C  Cardiologist:  Dr. Marlou Porch     Chief Complaint  Patient presents with  . Hospitalization Follow-up    for PNA, copd and a fib.       History of Present Illness: Diane Patterson is a 72 y.o. female who presents for post hospitalization with PNA, COPD, A fib with RVR    Recent COPD exacerbation,  PNA    She has a history of significant COPD, on O2 QHS,  HTN, and chest pain. She had a low risk Myoview in Feb 2017. She last saw Dr Marlou Porch in Feb 2018 and he had discussed having a Rt and Lt heart cath but she ultimately declined. She was admitted to [facility] 07/11/17 with cough, increased DOE, and wheezing. She improved with antibiotics and steroids. She had actually been discharged this morning but became SOB when they got her up to ambulate. She was noted to be tachycardic on exam and she was placed on telemetry which revealed AF with RVR. She was started on IV Diltiazem and transferred to ICU and by the next AM had converted to SR.  She was discharged 07/16/17.     Today she feels better.  No awareness of rapid HR.  No chest pain.   EKG with SR.  No bleeding on Eliquis.  She has some chest discomfort with activity so she has slowed her activity.  It is similar to discomfort in 2018.  We re-discussed cardiac cath but she is still not interested, but is aware if symptoms increase she should call and we would re-discuss. She does have a new PCP but has not yet seen.  Does need refill of meds.    Past Medical History:  Diagnosis Date  . Asthma   . Chronic airflow obstruction (HCC)   . Hypertension   . Tobacco abuse   . Venous insufficiency     Past Surgical History:  Procedure Laterality Date  . cataract    . NECK SURGERY    . no colonoscopy     "afraid to "; [facility] reviewed  . VESICOVAGINAL FISTULA CLOSURE W/ TAH       Current Outpatient Medications    Medication Sig Dispense Refill  . acetaminophen (TYLENOL) 325 MG tablet Take 650 mg by mouth every 6 (six) hours as needed for mild pain.     Marland Kitchen albuterol (PROVENTIL HFA;VENTOLIN HFA) 108 (90 Base) MCG/ACT inhaler Inhale 2 puffs into the lungs every 4 (four) hours as needed for wheezing or shortness of breath. 1 Inhaler 0  . apixaban (ELIQUIS) 5 MG TABS tablet Take 1 tablet (5 mg total) by mouth 2 (two) times daily. 60 tablet 0  . Azelastine-Fluticasone 137-50 MCG/ACT SUSP Place 2 sprays into the nose daily. 1 Bottle 5  . budesonide-formoterol (SYMBICORT) 160-4.5 MCG/ACT inhaler Inhale 2 puffs into the lungs 2 (two) times daily. 10.2 g 5  . diltiazem (CARDIZEM CD) 240 MG 24 hr capsule Take 1 capsule (240 mg total) by mouth daily. 30 capsule 0  . hydrochlorothiazide (MICROZIDE) 12.5 MG capsule Take 2 capsules (25 mg total) by mouth daily. 90 capsule 0  . ipratropium-albuterol (DUONEB) 0.5-2.5 (3) MG/3ML SOLN Take 3 mLs by nebulization every 6 (six) hours as needed. 360 mL 0  . loratadine (CLARITIN) 10 MG tablet Take 1 tablet (10 mg total) by mouth daily. 5 tablet 0  .  methocarbamol (ROBAXIN) 500 MG tablet Take 1 tablet (500 mg total) by mouth every 8 (eight) hours as needed for muscle spasms. 20 tablet 0  . potassium chloride SA (K-DUR,KLOR-CON) 20 MEQ tablet Take 1 tablet (20 mEq total) by mouth daily. 30 tablet 11  . predniSONE (DELTASONE) 10 MG tablet Take 40mg x2days,30mg x2days,20mg x2days,10mg x2days,then stop 20 tablet 0   No current facility-administered medications for this visit.     Allergies:   Patient has no known allergies.    Social History:  The patient  reports that she quit smoking about 3 years ago. Her smoking use included cigarettes. She has a 11.25 pack-year smoking history. She quit smokeless tobacco use about 3 years ago. She reports that she does not drink alcohol or use drugs.   Family History:  The patient's family history includes Asthma in her father; Diabetes in her  mother; Emphysema in her father; Heart disease in her father.    ROS:  General:no colds or fevers, no weight changes Skin:no rashes or ulcers HEENT:no blurred vision, no congestion CV:see HPI PUL:see HPI GI:no diarrhea constipation or melena, no indigestion GU:no hematuria, no dysuria MS:no joint pain, no claudication Neuro:no syncope, no lightheadedness Endo:no diabetes, no thyroid disease  Wt Readings from Last 3 Encounters:  08/08/17 132 lb 1.9 oz (59.9 kg)  07/25/17 137 lb (62.1 kg)  07/15/17 135 lb 12.9 oz (61.6 kg)     PHYSICAL EXAM: VS:  BP (!) 154/80   Pulse 88   Ht 5\' 7"  (1.702 m)   Wt 132 lb 1.9 oz (59.9 kg)   LMP  (LMP Unknown)   BMI 20.69 kg/m  , BMI Body mass index is 20.69 kg/m. General:Pleasant affect, NAD Skin:Warm and dry, brisk capillary refill HEENT:normocephalic, sclera clear, mucus membranes moist Neck:supple, no JVD, no bruits  Heart:S1S2 RRR without murmur, gallup, rub or click Lungs:clear without rales, rhonchi, or wheezes STM:HDQQ, non tender, + BS, do not palpate liver spleen or masses Ext:no lower ext edema, 2+ pedal pulses, 2+ radial pulses Neuro:alert and oriented, MAE, follows commands, + facial symmetry Re check BP 140/70     EKG:  EKG is ordered today. The ekg ordered today demonstrates SR LAD non specific T wave abnormality but no acute changes.     Recent Labs: 07/11/2017: Magnesium 1.9 07/15/2017: ALT 102 07/16/2017: BUN 29; Creatinine, Ser 0.75; Hemoglobin 13.8; Platelets 349; Potassium 4.1; Sodium 137; TSH 0.591    Lipid Panel    Component Value Date/Time   CHOL 203 (H) 02/10/2010 1017   TRIG 138.0 02/10/2010 1017   HDL 55.10 02/10/2010 1017   CHOLHDL 4 02/10/2010 1017   VLDL 27.6 02/10/2010 1017   LDLDIRECT 136.0 02/10/2010 1017       Other studies Reviewed: Additional studies/ records that were reviewed today include: . Echo 07/15/17 Study Conclusions  - Procedure narrative: Transthoracic echocardiography.  Technically   difficult study with suboptimal image quality. - Left ventricle: The cavity size was normal. Wall thickness was   increased in a pattern of mild LVH. Systolic function was   vigorous. The estimated ejection fraction was in the range of 65%   to 70%.  Impressions:  - Technically difficult study. Hyperdynamic LV systolic function   with LVEF of 65-70%, mild LVH, normal LA size.  ASSESSMENT AND PLAN:  1.  PAF in combination with COPD exacerbation and PNA.  Maintaining SR follow up with Dr. Marlou Porch in 3 months.   2.  Anticoagulation on Eliquis, will check CBC and CMP on medication.  Denies any bleeding.    3.  Occ chest pressure with exertion, in 2018 rt and lt heart cath recommended and pt declined, reviewed again today with her and again declined.  If her chest pressure increases she will notify us and may change her mind.    4.  HTN elevated initially but improved on recheck.    5.  COPD per pulmonary and on home oxygen.     Current medicines are reviewed with the patient today.  The patient Has no concerns regarding medicines.  The following changes have been made:  See above Labs/ tests ordered today include:see above  Disposition:   FU:  see above  Signed, Cecilie Kicks, NP  08/08/2017 8:41 AM    [facility] [place], [place], [place], [place] Phone: [phone]; Fax: [phone]

## 2017-08-05 NOTE — Telephone Encounter (Signed)
Called and spoke with pt letting her know we were sending two scripts to her preferred pharmacy, one of mucinex dm and another of pred taper for her to follow the instructions on the Rx for how to take med.  Pt expressed understanding. Nothing further needed at this current time.

## 2017-08-05 NOTE — Telephone Encounter (Signed)
Per TP: okay for prednisone 10mg  #20 4x2, 3x2, 2x2, 1x2 and stop along with Mucinex DM twice daily as needed for cough/congestion.  If symptoms do not improve please call the office or seek emergency attention if they worsen.  Thanks.

## 2017-08-05 NOTE — Telephone Encounter (Signed)
Called and spoke with pt who stated she was recently hospitalized 07/11/17-07/16/17 with pna.  Pt is calling asking if she can have a new Rx of prednisone.  Pt has complaints of coughing with clear mucus and has a lot of drainage.  Pt last saw Tammy Parrett, 07/25/17 and she stated if pt had any flare-ups to call our office.  Tammy, please advise on this for pt.  Thanks!

## 2017-08-08 ENCOUNTER — Ambulatory Visit: Payer: Medicare Other | Admitting: Cardiology

## 2017-08-08 ENCOUNTER — Encounter: Payer: Self-pay | Admitting: Cardiology

## 2017-08-08 VITALS — BP 154/80 | HR 88 | Ht 67.0 in | Wt 132.1 lb

## 2017-08-08 DIAGNOSIS — Z79899 Other long term (current) drug therapy: Secondary | ICD-10-CM

## 2017-08-08 DIAGNOSIS — I10 Essential (primary) hypertension: Secondary | ICD-10-CM

## 2017-08-08 DIAGNOSIS — I209 Angina pectoris, unspecified: Secondary | ICD-10-CM | POA: Diagnosis not present

## 2017-08-08 DIAGNOSIS — I480 Paroxysmal atrial fibrillation: Secondary | ICD-10-CM | POA: Diagnosis not present

## 2017-08-08 DIAGNOSIS — J438 Other emphysema: Secondary | ICD-10-CM

## 2017-08-08 LAB — COMPREHENSIVE METABOLIC PANEL
A/G RATIO: 2 (ref 1.2–2.2)
ALK PHOS: 141 IU/L — AB (ref 39–117)
ALT: 27 IU/L (ref 0–32)
AST: 15 IU/L (ref 0–40)
Albumin: 4.3 g/dL (ref 3.5–4.8)
BILIRUBIN TOTAL: 0.3 mg/dL (ref 0.0–1.2)
BUN/Creatinine Ratio: 20 (ref 12–28)
BUN: 18 mg/dL (ref 8–27)
CHLORIDE: 95 mmol/L — AB (ref 96–106)
CO2: 26 mmol/L (ref 20–29)
Calcium: 9.7 mg/dL (ref 8.7–10.3)
Creatinine, Ser: 0.92 mg/dL (ref 0.57–1.00)
GFR calc Af Amer: 72 mL/min/{1.73_m2} (ref >59)
GFR calc non Af Amer: 62 mL/min/{1.73_m2} (ref >59)
GLOBULIN, TOTAL: 2.1 g/dL (ref 1.5–4.5)
Glucose: 289 mg/dL — ABNORMAL HIGH (ref 65–99)
POTASSIUM: 4 mmol/L (ref 3.5–5.2)
SODIUM: 141 mmol/L (ref 134–144)
Total Protein: 6.4 g/dL (ref 6.0–8.5)

## 2017-08-08 LAB — CBC
HEMATOCRIT: 35 % (ref 34.0–46.6)
Hemoglobin: 11.7 g/dL (ref 11.1–15.9)
MCH: 29.9 pg (ref 26.6–33.0)
MCHC: 33.4 g/dL (ref 31.5–35.7)
MCV: 90 fL (ref 79–97)
Platelets: 371 10*3/uL (ref 150–379)
RBC: 3.91 x10E6/uL (ref 3.77–5.28)
RDW: 14 % (ref 12.3–15.4)
WBC: 10.4 10*3/uL (ref 3.4–10.8)

## 2017-08-08 MED ORDER — APIXABAN 5 MG PO TABS: 5.0000 mg | ORAL_TABLET | Freq: Two times a day (BID) | ORAL | 11 refills | Status: DC

## 2017-08-08 MED ORDER — DILTIAZEM HCL ER COATED BEADS 240 MG PO CP24: 240.0000 mg | ORAL_CAPSULE | Freq: Every day | ORAL | 3 refills | Status: DC

## 2017-08-08 MED ORDER — METHOCARBAMOL 500 MG PO TABS: 500.0000 mg | ORAL_TABLET | Freq: Three times a day (TID) | ORAL | 0 refills | Status: DC | PRN

## 2017-08-08 MED ORDER — POTASSIUM CHLORIDE CRYS ER 20 MEQ PO TBCR: 20.0000 meq | EXTENDED_RELEASE_TABLET | Freq: Every day | ORAL | 3 refills | Status: DC

## 2017-08-08 MED ORDER — HYDROCHLOROTHIAZIDE 12.5 MG PO CAPS: 25.0000 mg | ORAL_CAPSULE | Freq: Every day | ORAL | 3 refills | Status: DC

## 2017-08-08 NOTE — Patient Instructions (Signed)
Medication Instructions:  Your physician recommends that you continue on your current medications as directed. Please refer to the Current Medication list given to you today.  If you need a refill on your cardiac medications, please contact your pharmacy first.  Labwork: Today for Complete blood count and complete metabolic panel   Testing/Procedures: None ordered   Follow-Up: Your physician recommends that you schedule a follow-up appointment in: 3 months with Dr. Marlou Porch.    Any Other Special Instructions Will Be Listed Below (If Applicable).   Thank you for choosing [facility]     If you need a refill on your cardiac medications before your next appointment, please call your pharmacy.

## 2017-08-10 ENCOUNTER — Telehealth: Payer: Self-pay | Admitting: Adult Health

## 2017-08-10 DIAGNOSIS — J441 Chronic obstructive pulmonary disease with (acute) exacerbation: Secondary | ICD-10-CM

## 2017-08-10 NOTE — Telephone Encounter (Signed)
Spoke with pt. She is needing a refill on Symbicort. Advised her that this prescription was sent to her pharmacy on 07/25/17 with 5 additional refills. Pt states that she will contact her pharmacy. Nothing further was needed.

## 2017-08-15 ENCOUNTER — Other Ambulatory Visit: Payer: Self-pay

## 2017-08-15 ENCOUNTER — Encounter: Payer: Self-pay | Admitting: Physician Assistant

## 2017-08-15 ENCOUNTER — Ambulatory Visit (INDEPENDENT_AMBULATORY_CARE_PROVIDER_SITE_OTHER): Payer: Medicare Other | Admitting: Physician Assistant

## 2017-08-15 VITALS — BP 148/72 | HR 67 | Temp 97.9°F | Resp 16 | Ht 65.0 in | Wt 135.8 lb

## 2017-08-15 DIAGNOSIS — I700 Atherosclerosis of aorta: Secondary | ICD-10-CM | POA: Diagnosis not present

## 2017-08-15 DIAGNOSIS — E785 Hyperlipidemia, unspecified: Secondary | ICD-10-CM

## 2017-08-15 DIAGNOSIS — F411 Generalized anxiety disorder: Secondary | ICD-10-CM

## 2017-08-15 DIAGNOSIS — R739 Hyperglycemia, unspecified: Secondary | ICD-10-CM | POA: Diagnosis not present

## 2017-08-15 DIAGNOSIS — I10 Essential (primary) hypertension: Secondary | ICD-10-CM | POA: Diagnosis not present

## 2017-08-15 NOTE — Progress Notes (Signed)
Patient ID: Diane Patterson MRN: [id number], DOB: [birth year], 72 y.o. Date of Encounter: @DATE @  Chief Complaint:  Chief Complaint  Patient presents with  . New Patient (Initial Visit)    HPI: 72 y.o. year old female  presents as "New Pt to [facility]."  She actually has come to this office in the past and has actually seen me here for visits in the past. States that somehow things got confused and ---"somebody told her that she could not come here and see the lung doctor both"--- so that is when she stopped coming here.  She had a recent hospitalization and I have reviewed that discharge summary.  That was 07/11/2017 through 07/16/2017. Had prior history of COPD/chronic hypoxic respiratory failure on nocturnal O2.   She was admitted to the hospitalist service for COPD exacerbation and pneumonia.   Slowly improving with steroids, bronchodilators, antimicrobial therapy.  During the hospitalization she also developed A. fib with RVR.  She was treated with Cardizem.  Started on anticoagulation with heparin.  Cardiology was consulted.  Spontaneously converted back to normal sinus rhythm.  Was started on Eliquis.  I have reviewed that since discharge home from the hospital she has had follow-up with Cardiology and has had follow-up labs that were recommended from [facility] discharge summary also recommended repeat chest x-ray to document resolution of pneumonia.  Today I reviewed that she had a chest x-ray with pulmonary on 07/25/17.  Pneumonia was resolved.  Today she reports that Cardiology feels that she needs to have a cardiac catheterization but that she has told them to wait for her to build up some strength.  Says "this pneumonia has made her feel really weak."   Reviewed chart to see what specialists and what medical providers she has been seeing in general. She sees Pulmonary Dr. Felipa Eth, NP She has been seeing Cardiology.  Says that she was not going  there routinely until recently since the hospitalization. She was seeing Mauricio Po NP with internal medicine -----says that "he retired and left" so that was one reason she was need to establish a PCP here.  Reviewed that I see no lipid panel in the computer from anywhere recent.  She says that she does not think her cholesterol has been checked in a long time.  Did review that 07/16/17 she had TSH was was normal.  She has had CBCs and CMET.  Glucose has been elevated on recent labs but this may have been secondary to prednisone use and infection etc. She states that she took her last dose of prednisone this morning. States that she lives very close to our office and could return Thursday morning fasting to check lipid panel and A1c.  Otherwise she has no specific concerns to address today.    Past Medical History:  Diagnosis Date  . Asthma   . Chronic airflow obstruction (HCC)   . Hypertension   . Tobacco abuse   . Venous insufficiency      Home Meds: Outpatient Medications Prior to Visit  Medication Sig Dispense Refill  . acetaminophen (TYLENOL) 325 MG tablet Take 650 mg by mouth every 6 (six) hours as needed for mild pain.     Marland Kitchen albuterol (PROVENTIL HFA;VENTOLIN HFA) 108 (90 Base) MCG/ACT inhaler Inhale 2 puffs into the lungs every 4 (four) hours as needed for wheezing or shortness of breath. 1 Inhaler 0  . apixaban (ELIQUIS) 5 MG TABS tablet Take 1 tablet (5 mg total) by  mouth 2 (two) times daily. 60 tablet 11  . Azelastine-Fluticasone 137-50 MCG/ACT SUSP Place 2 sprays into the nose daily. 1 Bottle 5  . budesonide-formoterol (SYMBICORT) 160-4.5 MCG/ACT inhaler Inhale 2 puffs into the lungs 2 (two) times daily. 10.2 g 5  . diltiazem (CARDIZEM CD) 240 MG 24 hr capsule Take 1 capsule (240 mg total) by mouth daily. 90 capsule 3  . hydrochlorothiazide (MICROZIDE) 12.5 MG capsule Take 2 capsules (25 mg total) by mouth daily. 180 capsule 3  . ipratropium-albuterol (DUONEB) 0.5-2.5  (3) MG/3ML SOLN Take 3 mLs by nebulization every 6 (six) hours as needed. 360 mL 0  . loratadine (CLARITIN) 10 MG tablet Take 1 tablet (10 mg total) by mouth daily. 5 tablet 0  . methocarbamol (ROBAXIN) 500 MG tablet Take 1 tablet (500 mg total) by mouth every 8 (eight) hours as needed for muscle spasms. 30 tablet 0  . potassium chloride SA (K-DUR,KLOR-CON) 20 MEQ tablet Take 1 tablet (20 mEq total) by mouth daily. 90 tablet 3  . predniSONE (DELTASONE) 10 MG tablet Take 40mg x2days,30mg x2days,20mg x2days,10mg x2days,then stop 20 tablet 0   No facility-administered medications prior to visit.     Allergies: No Known Allergies  Social History   Socioeconomic History  . Marital status: Married    Spouse name: Not on file  . Number of children: 1  . Years of education: Not on file  . Highest education level: Not on file  Social Needs  . Financial resource strain: Not on file  . Food insecurity - worry: Not on file  . Food insecurity - inability: Not on file  . Transportation needs - medical: Not on file  . Transportation needs - non-medical: Not on file  Occupational History  . Not on file  Tobacco Use  . Smoking status: Former Smoker    Packs/day: 0.75    Years: 15.00    Pack years: 11.25    Types: Cigarettes    Last attempt to quit: 03/20/2014    Years since quitting: 3.4  . Smokeless tobacco: Former Systems developer    Quit date: 03/11/2014  Substance and Sexual Activity  . Alcohol use: No    Alcohol/week: 0.0 oz  . Drug use: No  . Sexual activity: Not on file  Other Topics Concern  . Not on file  Social History Narrative  . Not on file    Family History  Problem Relation Age of Onset  . Diabetes Mother   . Emphysema Father   . Asthma Father   . Heart disease Father      Review of Systems:  See HPI for pertinent ROS. All other ROS negative.    Physical Exam: Blood pressure (!) 148/72, pulse 67, temperature 97.9 F (36.6 C), temperature source Oral, resp. rate 16, height 5'  5" (1.651 m), weight 61.6 kg (135 lb 12.8 oz), SpO2 99 %., Body mass index is 22.6 kg/m. General: WNWD WF wearing O2 [place]. Appears in no acute distress. Neck: Supple. No thyromegaly. No lymphadenopathy. Lungs: Breath sounds are distant and decreased throughout bilaterally.  I hear no wheezing rhonchi or rales. Heart: RRR with S1 S2. No murmurs, rubs, or gallops. Abdomen: Soft, non-tender, non-distended with normoactive bowel sounds. No hepatomegaly. No rebound/guarding. No obvious abdominal masses. Musculoskeletal:  Strength and tone normal for age. Extremities/Skin: Warm and dry.  No LE edema.  Neuro: Alert and oriented X 3. Moves all extremities spontaneously. Gait is normal. CNII-XII grossly in tact. Psych:  Responds to questions appropriately with a normal  affect.     ASSESSMENT AND PLAN:  72 y.o. year old female with  1. ATHEROSCLEROSIS OF AORTA - Lipid panel; Future  2. Essential hypertension  3. Anxiety state  4. Hyperlipidemia, unspecified hyperlipidemia type - Lipid panel; Future  5. Hyperglycemia - Hemoglobin A1c; Future  Return Thursday morning fasting to check A1c and lipid panel. She has had all other labs recently including a TSH, free T4, CBC, CME T. Follow-up with her when I get these lab results. Otherwise will let her wait to schedule routine follow-up visit here once everything settles down from her recent hospitalization and she is feeling like she has gotten her strength back.  Today she asked if her husband also can become a patient here and she has taken new patient information for him.  [address] [place], [place], [facility] 08/15/2017 4:37 PM

## 2017-08-18 ENCOUNTER — Other Ambulatory Visit: Payer: Medicare Other

## 2017-08-18 ENCOUNTER — Ambulatory Visit: Payer: Medicare Other | Admitting: Emergency Medicine

## 2017-08-18 DIAGNOSIS — R739 Hyperglycemia, unspecified: Secondary | ICD-10-CM

## 2017-08-18 DIAGNOSIS — E785 Hyperlipidemia, unspecified: Secondary | ICD-10-CM

## 2017-08-18 DIAGNOSIS — I700 Atherosclerosis of aorta: Secondary | ICD-10-CM

## 2017-08-19 LAB — LIPID PANEL
Cholesterol: 154 mg/dL (ref <200)
HDL: 58 mg/dL (ref >50)
LDL Cholesterol (Calc): 74 mg/dL (calc)
NON-HDL CHOLESTEROL (CALC): 96 mg/dL (ref <130)
Total CHOL/HDL Ratio: 2.7 (calc) (ref <5.0)
Triglycerides: 135 mg/dL (ref <150)

## 2017-08-19 LAB — HEMOGLOBIN A1C
HEMOGLOBIN A1C: 7.2 %{Hb} — AB (ref <5.7)
Mean Plasma Glucose: 160 (calc)
eAG (mmol/L): 8.9 (calc)

## 2017-08-22 ENCOUNTER — Encounter: Payer: Self-pay | Admitting: Physician Assistant

## 2017-08-22 ENCOUNTER — Ambulatory Visit: Payer: Medicare Other | Admitting: Physician Assistant

## 2017-08-22 VITALS — BP 142/86 | HR 83 | Temp 98.1°F | Resp 14 | Wt 131.4 lb

## 2017-08-22 DIAGNOSIS — B839 Helminthiasis, unspecified: Secondary | ICD-10-CM | POA: Diagnosis not present

## 2017-08-22 DIAGNOSIS — E119 Type 2 diabetes mellitus without complications: Secondary | ICD-10-CM | POA: Diagnosis not present

## 2017-08-22 NOTE — Progress Notes (Signed)
Patient ID: Diane Patterson MRN: [id number], DOB: [birth year], 72 y.o. Date of Encounter: 08/22/2017, 12:13 PM    Chief Complaint:  Chief Complaint  Patient presents with  . sore on right arm     HPI: 72 y.o. year old female presents with above.   She states that "dermatology recently took spot off of her right forearm and is wondering if that is healing fine."  I told her that looks like it is healing and looks appropriate.  Also reports that she recently noticed something in her stool and now thinks that it might be worms.  Also reviewed that she recently had visit with me and follow-up labs.  I recently received the lab results this past Friday, I saw that she had a visit scheduled with me so felt that would be easier to discuss those lab results in person during this visit rather than with the staff over the phone.  Today I am reviewing with her the lipid profile and A1c that was performed here on 08/18/17.  Lipid profile was excellent with LDL 74.  However A1c is 7.2.  Today I recommended starting a medication for this.  However she states that she definitely will make changes to her diet and does not want to take any more pills unless absolutely necessary. I discussed with her that she is not overweight and asked whether it is realistic to think that she can make much change to her diet.  She says oh yes there is all kinds of things that I could improve.  Says that she eats out regularly and goes to [facility] where she has been getting fried fish but will change to broiled.  Also says that she goes to [employer] and eats [employer].  I then discussed if she was eating much sugary food.  Says that she does eat canned peaches.  Also says that she can cut back on her bread potatoes and rice.   I then gave and reviewed a carbohydrate handout and discussed and explained this with her and she states that she definitely will make some changes.     Home Meds:   Outpatient Medications  Prior to Visit  Medication Sig Dispense Refill  . acetaminophen (TYLENOL) 325 MG tablet Take 650 mg by mouth every 6 (six) hours as needed for mild pain.     Marland Kitchen albuterol (PROVENTIL HFA;VENTOLIN HFA) 108 (90 Base) MCG/ACT inhaler Inhale 2 puffs into the lungs every 4 (four) hours as needed for wheezing or shortness of breath. 1 Inhaler 0  . apixaban (ELIQUIS) 5 MG TABS tablet Take 1 tablet (5 mg total) by mouth 2 (two) times daily. 60 tablet 11  . budesonide-formoterol (SYMBICORT) 160-4.5 MCG/ACT inhaler Inhale 2 puffs into the lungs 2 (two) times daily. 10.2 g 5  . diltiazem (CARDIZEM CD) 240 MG 24 hr capsule Take 1 capsule (240 mg total) by mouth daily. 90 capsule 3  . hydrochlorothiazide (MICROZIDE) 12.5 MG capsule Take 2 capsules (25 mg total) by mouth daily. 180 capsule 3  . ipratropium-albuterol (DUONEB) 0.5-2.5 (3) MG/3ML SOLN Take 3 mLs by nebulization every 6 (six) hours as needed. 360 mL 0  . loratadine (CLARITIN) 10 MG tablet Take 1 tablet (10 mg total) by mouth daily. 5 tablet 0  . methocarbamol (ROBAXIN) 500 MG tablet Take 1 tablet (500 mg total) by mouth every 8 (eight) hours as needed for muscle spasms. 30 tablet 0  . potassium chloride SA (K-DUR,KLOR-CON) 20 MEQ tablet Take  1 tablet (20 mEq total) by mouth daily. 90 tablet 3  . Azelastine-Fluticasone 137-50 MCG/ACT SUSP Place 2 sprays into the nose daily. (Patient not taking: Reported on 08/22/2017) 1 Bottle 5   No facility-administered medications prior to visit.     Allergies: No Known Allergies    Review of Systems: See HPI for pertinent ROS. All other ROS negative.    Physical Exam: Blood pressure (!) 142/86, pulse 83, temperature 98.1 F (36.7 C), temperature source Oral, resp. rate 14, weight 59.6 kg (131 lb 6.4 oz), SpO2 100 %., Body mass index is 21.87 kg/m. General:  WNWD WF. Appears in no acute distress.  Neck: Supple. No thyromegaly. No lymphadenopathy. Lungs: Clear bilaterally to auscultation without wheezes,  rales, or rhonchi. Breathing is unlabored. Heart: Regular rhythm. No murmurs, rubs, or gallops. Abdomen: Soft, non-tender, non-distended with normoactive bowel sounds. No hepatomegaly. No rebound/guarding. No obvious abdominal masses. Msk:  Strength and tone normal for age. Extremities/Skin: Warm and dry.  Neuro: Alert and oriented X 3. Moves all extremities spontaneously. Gait is normal. CNII-XII grossly in tact. Psych:  Responds to questions appropriately with a normal affect.     ASSESSMENT AND PLAN:  72 y.o. year old female with  1. Type 2 diabetes mellitus without complication, without long-term current use of insulin (HCC) I discussed adding medication but she wants to make diet changes first.  Says that she can definitely make some improvements and decrease carbohydrate intake.  Today I gave and reviewed handout for low carbohydrate diet.  She will have follow-up visit with me in 3 months to recheck A1c to monitor this.  2. Worms in stool - Ova and parasite examination   Signed, [place], [place], [facility] 08/22/2017 12:13 PM

## 2017-08-24 ENCOUNTER — Other Ambulatory Visit: Payer: Medicare Other

## 2017-08-25 LAB — OVA AND PARASITE EXAMINATION
CONCENTRATE RESULT:: NONE SEEN
MICRO NUMBER:: 90319538
SPECIMEN QUALITY:: ADEQUATE
TRICHROME RESULT:: NONE SEEN

## 2017-08-26 ENCOUNTER — Telehealth: Payer: Self-pay

## 2017-08-26 NOTE — Telephone Encounter (Signed)
Patient called requesting her lab results, I went over patient lab results.

## 2017-09-07 ENCOUNTER — Encounter: Payer: Self-pay | Admitting: Physician Assistant

## 2017-09-07 ENCOUNTER — Ambulatory Visit: Payer: Medicare Other | Admitting: Physician Assistant

## 2017-09-07 VITALS — BP 150/90 | HR 80 | Temp 98.1°F | Resp 16 | Ht 65.0 in | Wt 134.6 lb

## 2017-09-07 DIAGNOSIS — J441 Chronic obstructive pulmonary disease with (acute) exacerbation: Secondary | ICD-10-CM | POA: Diagnosis not present

## 2017-09-07 DIAGNOSIS — J9611 Chronic respiratory failure with hypoxia: Secondary | ICD-10-CM | POA: Diagnosis not present

## 2017-09-07 MED ORDER — AZITHROMYCIN 250 MG PO TABS: ORAL_TABLET | ORAL | 0 refills | Status: DC

## 2017-09-07 MED ORDER — CETIRIZINE HCL 10 MG PO CAPS: 1.0000 | ORAL_CAPSULE | Freq: Every day | ORAL | 5 refills | Status: DC | PRN

## 2017-09-07 NOTE — Progress Notes (Signed)
Patient ID: BRIEANNE MIGNONE MRN: [id number], DOB: [birth year], 72 y.o. Date of Encounter: 09/07/2017, 5:06 PM    Chief Complaint:  Chief Complaint  Patient presents with  . chest congestion         HPI: 72 y.o. year old female presents with above.   She reports that she has been feeling phlegm and congestion in her chest for the last several days.  States that she does not feel increased shortness of breath or wheezing compared to her usual baseline but just can tell that there is increased drainage and phlegm there.  Has had no known fevers or chills.  No sore throat.  No head or nasal congestion.     Home Meds:   Outpatient Medications Prior to Visit  Medication Sig Dispense Refill  . acetaminophen (TYLENOL) 325 MG tablet Take 650 mg by mouth every 6 (six) hours as needed for mild pain.     Marland Kitchen albuterol (PROVENTIL HFA;VENTOLIN HFA) 108 (90 Base) MCG/ACT inhaler Inhale 2 puffs into the lungs every 4 (four) hours as needed for wheezing or shortness of breath. 1 Inhaler 0  . apixaban (ELIQUIS) 5 MG TABS tablet Take 1 tablet (5 mg total) by mouth 2 (two) times daily. 60 tablet 11  . Azelastine-Fluticasone 137-50 MCG/ACT SUSP Place 2 sprays into the nose daily. 1 Bottle 5  . budesonide-formoterol (SYMBICORT) 160-4.5 MCG/ACT inhaler Inhale 2 puffs into the lungs 2 (two) times daily. 10.2 g 5  . diltiazem (CARDIZEM CD) 240 MG 24 hr capsule Take 1 capsule (240 mg total) by mouth daily. 90 capsule 3  . hydrochlorothiazide (MICROZIDE) 12.5 MG capsule Take 2 capsules (25 mg total) by mouth daily. 180 capsule 3  . ipratropium-albuterol (DUONEB) 0.5-2.5 (3) MG/3ML SOLN Take 3 mLs by nebulization every 6 (six) hours as needed. 360 mL 0  . methocarbamol (ROBAXIN) 500 MG tablet Take 1 tablet (500 mg total) by mouth every 8 (eight) hours as needed for muscle spasms. 30 tablet 0  . potassium chloride SA (K-DUR,KLOR-CON) 20 MEQ tablet Take 1 tablet (20 mEq total) by mouth daily. 90 tablet 3  .  loratadine (CLARITIN) 10 MG tablet Take 1 tablet (10 mg total) by mouth daily. 5 tablet 0   No facility-administered medications prior to visit.     Allergies: No Known Allergies    Review of Systems: See HPI for pertinent ROS. All other ROS negative.    Physical Exam: Blood pressure (!) 150/90, pulse 80, temperature 98.1 F (36.7 C), temperature source Oral, resp. rate 16, height 5\' 5"  (1.651 m), weight 61.1 kg (134 lb 9.6 oz), SpO2 98 %., Body mass index is 22.4 kg/m. General:  WNWD WF. Appears in no acute distress. HEENT: Normocephalic, atraumatic, eyes without discharge, sclera non-icteric, nares are without discharge. Bilateral auditory canals clear, TM's are without perforation, pearly grey and translucent with reflective cone of light bilaterally. Oral cavity moist, posterior pharynx without exudate, erythema, peritonsillar abscess.  Neck: Supple. No thyromegaly. No lymphadenopathy. Lungs: Breath sounds are distant and decreased but sounds clear with no active wheezing or rhonchi or rales. Heart: Regular rhythm. No murmurs, rubs, or gallops. Msk:  Strength and tone normal for age. Extremities/Skin: Warm and dry.  No LE edema.  Neuro: Alert and oriented X 3. Moves all extremities spontaneously. Gait is normal. CNII-XII grossly in tact. Psych:  Responds to questions appropriately with a normal affect.     ASSESSMENT AND PLAN:  72 y.o. year old female with  1. COPD with acute exacerbation ([place]) She is currently on nasal cannula oxygen even through her visit.  She is on chronic pulmonary medications.  Will add azithromycin to cover for any bacterial infection and will add Zyrtec to help dry up drainage.  Follow-up if symptoms worsen or do not resolve. - Cetirizine HCl (ZYRTEC ALLERGY) 10 MG CAPS; Take 1 capsule (10 mg total) by mouth daily as needed.  Dispense: 30 capsule; Refill: 5 - azithromycin (ZITHROMAX) 250 MG tablet; Day 1: Take 2 daily. Days 2-5: Take 1 daily.  Dispense:  6 tablet; Refill: 0  2. Chronic respiratory failure with hypoxia (HCC) She is currently on nasal cannula oxygen even through her visit.  She is on chronic pulmonary medications.  Will add azithromycin to cover for any bacterial infection and will add Zyrtec to help dry up drainage.  Follow-up if symptoms worsen or do not resolve. - Cetirizine HCl (ZYRTEC ALLERGY) 10 MG CAPS; Take 1 capsule (10 mg total) by mouth daily as needed.  Dispense: 30 capsule; Refill: 5 - azithromycin (ZITHROMAX) 250 MG tablet; Day 1: Take 2 daily. Days 2-5: Take 1 daily.  Dispense: 6 tablet; Refill: 0   Signed, [address] [place], [place], [facility] 09/07/2017 5:06 PM

## 2017-09-13 ENCOUNTER — Encounter: Payer: Self-pay | Admitting: Emergency Medicine

## 2017-09-13 ENCOUNTER — Ambulatory Visit: Payer: Medicare Other | Admitting: Emergency Medicine

## 2017-09-13 DIAGNOSIS — J9611 Chronic respiratory failure with hypoxia: Secondary | ICD-10-CM

## 2017-09-13 DIAGNOSIS — J449 Chronic obstructive pulmonary disease, unspecified: Secondary | ICD-10-CM

## 2017-09-13 DIAGNOSIS — J301 Allergic rhinitis due to pollen: Secondary | ICD-10-CM

## 2017-09-13 MED ORDER — FLUTICASONE-UMECLIDIN-VILANT 100-62.5-25 MCG/INH IN AEPB: 1.0000 | INHALATION_SPRAY | Freq: Every day | RESPIRATORY_TRACT | 0 refills | Status: DC

## 2017-09-13 NOTE — Assessment & Plan Note (Signed)
Now on O2 24x7. I will consider repeat walking oximetry once she is on optimal BD regimen

## 2017-09-13 NOTE — Progress Notes (Signed)
   Subjective:    Patient ID: Diane Patterson, female    DOB: October 03, 1945, 72 y.o.   MRN: [id number]  HPI 72 year old former smoker, formerly followed by Dr Elsworth Soho for fixed asthma / COPD. I have personally reviewed her spirometry and pulmonary function testing from 2009 and then 2016, shows very severe obstruction with an FEV1 of approximately 0.73 L.  History of frequent exacerbations.  ROV 03/18/17 -- This is a follow-up visit for COPD and fixed asthma with severe obstruction on spirometry. She had a flare in the beginning of August and then appeared to be acutely exacerbated when I saw her on 02/08/17. I treated her with prednisone and azithromycin. At that time I also added Spiriva Respimat to her existing Symbicort. She wants a POC but has not desaturated on our ambulatory oximetry. She is getting her flu shot on 10/5. Tells me that her cough, mucous, wheeze are better since last time. She complains today of fatigue, low energy. She has chronic hoarseness, congestion. Difficult to say whether she benefited from the Spiriva because she was on pred at the time. She has been on dymista, but expensive. She will need referral to a new [place] PCP as [facility] leaving.   ROV 09/13/17 --follow-up visit for severe COPD/fixed asthma.  She also has a history of chronic rhinitis. She has a hx of frequent exacerbations, was admitted to [facility] for CAP in February, c/b A fib. She is now on O2 24x7, 2L/min. She remains on Symbicort and DuoNeb which she uses prn, about 1x a day. She is planning to buy a portable concentrator.    Review of Systems      Objective:   Physical Exam Vitals:   09/13/17 1203 09/13/17 1204  BP:  128/76  Pulse:  78  SpO2:  97%  Weight: 132 lb (59.9 kg)   Height: 5\' 2"  (1.575 m)    Gen: Pleasant, Elderly woman, well-nourished, in no distress,  normal affect  ENT: No lesions,  mouth clear,  oropharynx clear, no postnasal drip  Neck: No JVD, no stridor  Lungs: No use of accessory  muscles, very distant  Cardiovascular: RRR, heart sounds normal, no murmur or gallops, no peripheral edema  Musculoskeletal: No deformities, no cyanosis or clubbing  Neuro: alert, non focal  Skin: Warm, no lesions or rashes     Assessment & Plan:  COPD (chronic obstructive pulmonary disease) (HCC) We will perform repeat pulmonary function testing at your next office visit.  Please continue your Oxygen at 2l/min at all times.  Please stop Symbicort for now Please start Trelegy one inhalation once a day.  Keep albuterol available to use 2 puffs up to every 4 hours if needed for shortness of breath.  Use DuoNeb up to every 6 hours if needed for shortness of breath.  Follow with Dr Lamonte Sakai in 1 month or next available to assess your status on the new medication and to review your breathing tests.  Allergic rhinitis Continue to use Dymista as needed for nasal congestion and drainage  Chronic respiratory failure with hypoxia ([place]) Now on O2 24x7. I will consider repeat walking oximetry once she is on optimal BD regimen  Baltazar Apo, MD, PhD 09/13/2017, 12:35 PM  Pulmonary and Critical Care [phone] or if no answer [phone]

## 2017-09-13 NOTE — Assessment & Plan Note (Signed)
We will perform repeat pulmonary function testing at your next office visit.  Please continue your Oxygen at 2l/min at all times.  Please stop Symbicort for now Please start Trelegy one inhalation once a day.  Keep albuterol available to use 2 puffs up to every 4 hours if needed for shortness of breath.  Use DuoNeb up to every 6 hours if needed for shortness of breath.  Follow with Dr Lamonte Sakai in 1 month or next available to assess your status on the new medication and to review your breathing tests.

## 2017-09-13 NOTE — Addendum Note (Signed)
Addended by: Jannette Spanner on: 09/13/2017 12:39 PM   Modules accepted: Orders

## 2017-09-13 NOTE — Patient Instructions (Addendum)
We will perform repeat pulmonary function testing at your next office visit.  Please continue your Oxygen at 2l/min at all times.  Please stop Symbicort for now Please start Trelegy one inhalation once a day.  Keep albuterol available to use 2 puffs up to every 4 hours if needed for shortness of breath.  Use DuoNeb up to every 6 hours if needed for shortness of breath.  Continue to use Dymista as needed for nasal congestion and drainage Follow with Dr Lamonte Sakai in 1 month or next available to assess your status on the new medication and to review your breathing tests.

## 2017-09-13 NOTE — Assessment & Plan Note (Signed)
Continue to use Dymista as needed for nasal congestion and drainage

## 2017-09-22 ENCOUNTER — Telehealth: Payer: Self-pay

## 2017-09-22 ENCOUNTER — Telehealth: Payer: Self-pay | Admitting: Emergency Medicine

## 2017-09-22 MED ORDER — PREDNISONE 10 MG PO TABS: ORAL_TABLET | ORAL | 0 refills | Status: DC

## 2017-09-22 NOTE — Telephone Encounter (Signed)
Called and spoke to patient. When asked if the trelegy was part of the problem patient stated, "I couldn't take it."  When asked for more information patient stated that she tried using the trelegy and did the rinse and gargle but that her throat would burn badly with the trelegy. Patient stated she switched herself back to the Symbicort.  Rx for Prednisone sent to [facility] on [address]. Nothing further needed at this time.   Routing to RB as FYI.

## 2017-09-22 NOTE — Telephone Encounter (Signed)
I reviewed that she had office note with pulmonary on 09/13/17--- since her last visit with me.   His note made no mention of her being on antibiotics or needing antibiotics.   His note did include to use Dymista for drainage and congestion.   Recommend to make sure she is using that the [place] but do not recommend any further antibiotic right now.

## 2017-09-22 NOTE — Telephone Encounter (Signed)
Patient called and left a message stating that she is still  has congestion and need another round of antibiotic.

## 2017-09-22 NOTE — Telephone Encounter (Signed)
We just changed her to trelegy, does she believe this is part of the problem? If so please let me know.   Ok to order pred > Take 40mg  daily for 3 days, then 30mg  daily for 3 days, then 20mg  daily for 3 days, then 10mg  daily for 3 days, then stop

## 2017-09-22 NOTE — Telephone Encounter (Signed)
Spoke with patient. She is requesting prednisone. Asked patient if she is currently taking prednisone daily, she replied no.   She stated that for the past week, she has been having increased congestion. Does not have a cough but her throat is irritated from post nasal drip. Denied any fevers. Slight increase in SOB.   She wishes to use [facility] on [address].   RB, please advise. Thanks!

## 2017-09-23 NOTE — Telephone Encounter (Signed)
Call placed to patient to discuss provider recommendations.[email or web address]

## 2017-10-06 ENCOUNTER — Other Ambulatory Visit: Payer: Self-pay

## 2017-10-06 ENCOUNTER — Encounter: Payer: Self-pay | Admitting: Physician Assistant

## 2017-10-06 ENCOUNTER — Ambulatory Visit: Payer: Medicare Other | Admitting: Physician Assistant

## 2017-10-06 VITALS — BP 124/82 | HR 64 | Temp 98.0°F | Ht 65.0 in | Wt 135.2 lb

## 2017-10-06 DIAGNOSIS — J439 Emphysema, unspecified: Secondary | ICD-10-CM

## 2017-10-06 DIAGNOSIS — J9611 Chronic respiratory failure with hypoxia: Secondary | ICD-10-CM | POA: Diagnosis not present

## 2017-10-06 NOTE — Progress Notes (Signed)
Patient ID: Diane Patterson MRN: [id number], DOB: 1945-07-15, 72 y.o. Date of Encounter: 10/06/2017, 11:33 AM    Chief Complaint:  Chief Complaint  Patient presents with  . COPD    States having difficulty breathing     HPI: 72 y.o. year old female presents with above.  When I enter -- she states-- "I am having a hard time breathing.  Shortness of breath.  I know it is the COPD, but I just want you to listen and check.  Want to see if I need antibiotic so I don't get pneumonia again".     Home Meds:   Outpatient Medications Prior to Visit  Medication Sig Dispense Refill  . acetaminophen (TYLENOL) 325 MG tablet Take 650 mg by mouth every 6 (six) hours as needed for mild pain.     Marland Kitchen albuterol (PROVENTIL HFA;VENTOLIN HFA) 108 (90 Base) MCG/ACT inhaler Inhale 2 puffs into the lungs every 4 (four) hours as needed for wheezing or shortness of breath. 1 Inhaler 0  . apixaban (ELIQUIS) 5 MG TABS tablet Take 1 tablet (5 mg total) by mouth 2 (two) times daily. 60 tablet 11  . Azelastine-Fluticasone 137-50 MCG/ACT SUSP Place 2 sprays into the nose daily. 1 Bottle 5  . diltiazem (CARDIZEM CD) 240 MG 24 hr capsule Take 1 capsule (240 mg total) by mouth daily. 90 capsule 3  . Fluticasone-Umeclidin-Vilant (TRELEGY ELLIPTA) 100-62.5-25 MCG/INH AEPB Inhale 1 puff into the lungs daily. 240 each 0  . hydrochlorothiazide (MICROZIDE) 12.5 MG capsule Take 2 capsules (25 mg total) by mouth daily. 180 capsule 3  . ipratropium-albuterol (DUONEB) 0.5-2.5 (3) MG/3ML SOLN Take 3 mLs by nebulization every 6 (six) hours as needed. 360 mL 0  . methocarbamol (ROBAXIN) 500 MG tablet Take 1 tablet (500 mg total) by mouth every 8 (eight) hours as needed for muscle spasms. 30 tablet 0  . potassium chloride SA (K-DUR,KLOR-CON) 20 MEQ tablet Take 1 tablet (20 mEq total) by mouth daily. 90 tablet 3  . predniSONE (DELTASONE) 10 MG tablet 40 mg X 3 days, 30mg  X 3 days, 20mg  X 3 days, 10mg  X 3 days, then stop  (Patient not taking: Reported on 10/06/2017) 30 tablet 0   No facility-administered medications prior to visit.     Allergies: No Known Allergies    Review of Systems: See HPI for pertinent ROS. All other ROS negative.    Physical Exam: Blood pressure 124/82, pulse 64, temperature 98 F (36.7 C), temperature source Oral, height 5\' 5"  (1.651 m), weight 61.3 kg (135 lb 4 oz), SpO2 99 %., Body mass index is 22.51 kg/m. General:  WNWD WF. Wearing [place] Oxygen. Appears in no acute distress.  Neck: Supple. No thyromegaly. No lymphadenopathy. Lungs: Breath sounds are distant throughout but are clear.  I hear no wheezes no rhonchi no rales. Heart: Regular rhythm. No murmurs, rubs, or gallops. Msk:  Strength and tone normal for age. Extremities/Skin: Warm and dry.  Neuro: Alert and oriented X 3. Moves all extremities spontaneously. Gait is normal. CNII-XII grossly in tact. Psych:  Responds to questions appropriately with a normal affect.     ASSESSMENT AND PLAN:  72 y.o. year old female with   1. Pulmonary emphysema, unspecified emphysema type ([place]) I have reviewed her recent office visit note with me and her recent office note with pulmonary and other recent notes where she has called to both of our offices. I reassured her that her oxygen saturation is 99% today.  I  have reassured her that exam is good and I hear no wheezes. Have reassured her to continue all current treatment the same and feel that no additional treatment is necessary at this time.  2. Chronic respiratory failure with hypoxia ([place]) I have reviewed her recent office visit note with me and her recent office note with pulmonary and other recent notes where she has called to both of our offices. I reassured her that her oxygen saturation is 99% today.  I have reassured her that exam is good and I hear no wheezes. Have reassured her to continue all current treatment the same and feel that no additional treatment is necessary at  this time.    [address] [place], [place], [facility] 10/06/2017 11:33 AM

## 2017-10-24 ENCOUNTER — Ambulatory Visit: Payer: Medicare Other | Admitting: Emergency Medicine

## 2017-11-04 ENCOUNTER — Ambulatory Visit: Payer: Medicare Other | Admitting: Cardiology

## 2017-11-04 ENCOUNTER — Encounter: Payer: Self-pay | Admitting: Cardiology

## 2017-11-04 VITALS — BP 122/80 | HR 68 | Ht 65.0 in | Wt 134.6 lb

## 2017-11-04 DIAGNOSIS — I480 Paroxysmal atrial fibrillation: Secondary | ICD-10-CM | POA: Diagnosis not present

## 2017-11-04 DIAGNOSIS — J438 Other emphysema: Secondary | ICD-10-CM

## 2017-11-04 DIAGNOSIS — I209 Angina pectoris, unspecified: Secondary | ICD-10-CM | POA: Diagnosis not present

## 2017-11-04 DIAGNOSIS — I10 Essential (primary) hypertension: Secondary | ICD-10-CM | POA: Diagnosis not present

## 2017-11-04 MED ORDER — DILTIAZEM HCL ER COATED BEADS 240 MG PO CP24: 240.0000 mg | ORAL_CAPSULE | Freq: Every day | ORAL | 3 refills | Status: DC

## 2017-11-04 MED ORDER — HYDROCHLOROTHIAZIDE 12.5 MG PO CAPS: 25.0000 mg | ORAL_CAPSULE | Freq: Every day | ORAL | 3 refills | Status: DC

## 2017-11-04 NOTE — Progress Notes (Signed)
Cardiology Office Note    Date:  11/04/2017   ID:  Diane Patterson, Diane Patterson [birth year], MRN [id number]  PCP:  Orlena Sheldon, PA-C  Cardiologist:   Candee Furbish, MD     History of Present Illness:  Diane Patterson is a 72 y.o. female with paroxysmal atrial fibrillation, COPD, asthma, hypertension here for follow-up.  Prior office visit reviewed-over the Christmas holidays, she had a horrible upper respiratory infection, was seen in the emergency department. Was hospitalized. Prior to this hospitalization, while sitting on the couch, her vision turned black. That night she had very sharp left-sided chest discomfort that was severe. She laid in bed very still for about 3 minutes and then it finally subsided. Worried her. Last week, while watching television, her vision once again turned black. She reports that she is not had any issues with low blood pressure in the past. They have been however trying to adjust her hypertension regimen. She bruises quite easily.  EKG from 06/17/15 shows sinus rhythm and left anterior fascicular block. Went to ER with SOB.   Thankfully, her nuclear stress test, event monitor reassuring. It is likely that her symptoms of shortness of breath or COPD related. No evidence of ischemia. When she coughs and brings up mucus, this does help sometimes with her shortness of breath. She did ask me why there is no cure for COPD at this point. We discussed at prior visit.  Had a episode of atrial fibrillation with rapid ventricular response on telemetry in February 2019.  Continuing with Eliquis.  Her husband had a bleed on Xarelto requiring 4 units of blood.  Would like to have every 27-month hemoglobin.  Seems very reasonable.  Past Medical History:  Diagnosis Date  . Asthma   . Chronic airflow obstruction (HCC)   . Hypertension   . Tobacco abuse   . Venous insufficiency     Past Surgical History:  Procedure Laterality Date  . cataract    . NECK SURGERY    . no  colonoscopy     "afraid to "; [facility] reviewed  . VESICOVAGINAL FISTULA CLOSURE W/ TAH      Outpatient Medications Prior to Visit  Medication Sig Dispense Refill  . acetaminophen (TYLENOL) 325 MG tablet Take 650 mg by mouth every 6 (six) hours as needed for mild pain.     Marland Kitchen albuterol (PROVENTIL HFA;VENTOLIN HFA) 108 (90 Base) MCG/ACT inhaler Inhale 2 puffs into the lungs every 4 (four) hours as needed for wheezing or shortness of breath. 1 Inhaler 0  . apixaban (ELIQUIS) 5 MG TABS tablet Take 1 tablet (5 mg total) by mouth 2 (two) times daily. 60 tablet 11  . ipratropium-albuterol (DUONEB) 0.5-2.5 (3) MG/3ML SOLN Take 3 mLs by nebulization every 6 (six) hours as needed. 360 mL 0  . methocarbamol (ROBAXIN) 500 MG tablet Take 1 tablet (500 mg total) by mouth every 8 (eight) hours as needed for muscle spasms. 30 tablet 0  . potassium chloride SA (K-DUR,KLOR-CON) 20 MEQ tablet Take 1 tablet (20 mEq total) by mouth daily. 90 tablet 3  . diltiazem (CARDIZEM CD) 240 MG 24 hr capsule Take 1 capsule (240 mg total) by mouth daily. 90 capsule 3  . hydrochlorothiazide (MICROZIDE) 12.5 MG capsule Take 2 capsules (25 mg total) by mouth daily. 180 capsule 3  . Azelastine-Fluticasone 137-50 MCG/ACT SUSP Place 2 sprays into the nose daily. (Patient not taking: Reported on 11/04/2017) 1 Bottle 5  . Fluticasone-Umeclidin-Vilant (TRELEGY ELLIPTA) 100-62.5-25 MCG/INH  AEPB Inhale 1 puff into the lungs daily. (Patient not taking: Reported on 11/04/2017) 240 each 0   No facility-administered medications prior to visit.      Allergies:   Patient has no known allergies.   Social History   Socioeconomic History  . Marital status: Married    Spouse name: Not on file  . Number of children: 1  . Years of education: Not on file  . Highest education level: Not on file  Occupational History  . Not on file  Social Needs  . Financial resource strain: Not on file  . Food insecurity:    Worry: Not on file    Inability:  Not on file  . Transportation needs:    Medical: Not on file    Non-medical: Not on file  Tobacco Use  . Smoking status: Former Smoker    Packs/day: 0.75    Years: 15.00    Pack years: 11.25    Types: Cigarettes    Last attempt to quit: 03/20/2014    Years since quitting: 3.6  . Smokeless tobacco: Former Systems developer    Quit date: 03/11/2014  Substance and Sexual Activity  . Alcohol use: No    Alcohol/week: 0.0 oz  . Drug use: No  . Sexual activity: Not on file  Lifestyle  . Physical activity:    Days per week: Not on file    Minutes per session: Not on file  . Stress: Not on file  Relationships  . Social connections:    Talks on phone: Not on file    Gets together: Not on file    Attends religious service: Not on file    Active member of club or organization: Not on file    Attends meetings of clubs or organizations: Not on file    Relationship status: Not on file  Other Topics Concern  . Not on file  Social History Narrative  . Not on file     Family History:  The patient's family history includes Asthma in her father; Diabetes in her mother; Emphysema in her father; Heart disease in her father.   ROS:   Please see the history of present illness.    Review of Systems  All other systems reviewed and are negative.    PHYSICAL EXAM:   VS:  BP 122/80   Pulse 68   Ht 5\' 5"  (1.651 m)   Wt 134 lb 9.6 oz (61.1 kg)   LMP  (LMP Unknown)   SpO2 99%   BMI 22.40 kg/m    GEN: Well nourished, well developed, in no acute distress  HEENT: normal  Neck: no JVD, carotid bruits, or masses Cardiac: RRR; no murmurs, rubs, or gallops,no edema , 2+ radial pulse Respiratory:  Poor air movement. No wheezes. GI: soft, nontender, nondistended, + BS MS: no deformity or atrophy  Skin: warm and dry, no rash Neuro:  Alert and Oriented x 3, Strength and sensation are intact Psych: euthymic mood, full affect  Wt Readings from Last 3 Encounters:  11/04/17 134 lb 9.6 oz (61.1 kg)  10/06/17  135 lb 4 oz (61.3 kg)  09/13/17 132 lb (59.9 kg)      Studies/Labs Reviewed:   EKG:  EKG today 07/21/16 shows normal sinus rhythm 74, left axis deviation, no other significant abnormality. Personally measured QT is 360 ms..    Recent Labs: 07/11/2017: Magnesium 1.9 07/16/2017: TSH 0.591 08/08/2017: ALT 27; BUN 18; Creatinine, Ser 0.92; Hemoglobin 11.7; Platelets 371; Potassium 4.0; Sodium  141   Lipid Panel    Component Value Date/Time   CHOL 154 08/18/2017 0849   TRIG 135 08/18/2017 0849   HDL 58 08/18/2017 0849   CHOLHDL 2.7 08/18/2017 0849   VLDL 27.6 02/10/2010 1017   LDLCALC 74 08/18/2017 0849   LDLDIRECT 136.0 02/10/2010 1017    Additional studies/ records that were reviewed today include:  Prior office notes reviewed, lab work reviewed Chest x-ray shows hyperinflation consistent with COPD  NUC stress: 08/12/15: 1. This study was count-poor at rest and with Lexiscan stress. There is a fixed, small, mild apical perfusion defect with normal wall motion that is likely artifact. No definite ischemia or infarction.  2. Normal EF and wall motion.  3. Low risk study.   Reassuring  Event monitor 08/12/15: No adverse rhythms.   ASSESSMENT:    1. Essential hypertension      PLAN:  In order of problems listed above:  1. Paroxysmal atrial fibrillation- continue with Eliquis.  Episode in February 2019.  No changes made.  Check hemoglobin every 6 months. 2. Angina- no further episodes.  Nuclear stress test in 2017 reassuring.    Her brother had a heart attack during a treadmill stress test so she was anxious about this. Prior episode was described as sharp pain lasting 3 minutes across her left chest wall, this is likely musculoskeletal in etiology.  At a prior visit we discussed proceeding with heart catheterization but she does not wish to pursue this direction.  Continue with medical management. 3. Near syncope-vision blackout. Could be from hypotension transiently. No other  constellation of vagal-like symptoms. Event monitor was reassuring with no adverse arrhythmias over the two-week time. She wore it. She had to stop wearing it because of flulike symptoms.  No changes made.  No new symptoms 4. COPD-WEARS O2 ALL THE TIME. poor air movement noted on exam. Inhalers. Dr. Elsworth Soho with pulmonary monitoring. 5. Essential hypertension-currently excellent 6. 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  Cecilie Kicks 6 months, me 12  Medication Adjustments/Labs and Tests Ordered: Current medicines are reviewed at length with the patient today.  Concerns regarding medicines are outlined above.  Medication changes, Labs and Tests ordered today are listed in the Patient Instructions below. Patient Instructions  Medication Instructions:  The current medical regimen is effective;  continue present plan and medications.  Follow-Up: Follow up in 6 months with Cecilie Kicks, NP.  You will receive a letter in the mail 2 months before you are due.  Please call us when you receive this letter to schedule your follow up appointment.  Follow up in 1 year with Dr. Marlou Porch.  You will receive a letter in the mail 2 months before you are due.  Please call us when you receive this letter to schedule your follow up appointment.  If you need a refill on your cardiac medications before your next appointment, please call your pharmacy.  Thank you for choosing [facility]!!          Signed, Candee Furbish, MD  11/04/2017 10:22 AM    [facility] [place], [place], [place]  [address] Phone: [phone]; Fax: [phone]

## 2017-11-04 NOTE — Patient Instructions (Signed)
Medication Instructions:  The current medical regimen is effective;  continue present plan and medications.  Follow-Up: Follow up in 6 months with Laura Ingold, NP.  You will receive a letter in the mail 2 months before you are due.  Please call us when you receive this letter to schedule your follow up appointment.  Follow up in 1 year with Dr. Skains.  You will receive a letter in the mail 2 months before you are due.  Please call us when you receive this letter to schedule your follow up appointment.  If you need a refill on your cardiac medications before your next appointment, please call your pharmacy.  Thank you for choosing [facility]!!     

## 2017-12-12 ENCOUNTER — Telehealth: Payer: Self-pay | Admitting: Emergency Medicine

## 2017-12-12 NOTE — Telephone Encounter (Signed)
We have received a fax from [facility] for Symbicort 160 but after viewing the medication list Symbicort 160 is not on her medication list. After veiw of the phone note on 09/22/17 the patient stopped taking Trelegy which was last prescribed at last ov. An restarted Symbicort 160.  I have tried to call patient to see if she still had medication due to Dr. Lamonte Sakai being out of office but the phone rang busy.Will route to Dr. Lamonte Sakai.Also to his nurse as a Pharmacist, hospital. Dr. Lamonte Sakai please advise if you are okay with the patient restarting Symbicort 160 before we start the PA.

## 2017-12-16 NOTE — Telephone Encounter (Signed)
lmtcb x1 for pt. 

## 2017-12-21 ENCOUNTER — Ambulatory Visit: Payer: Medicare Other | Admitting: Physician Assistant

## 2017-12-22 ENCOUNTER — Encounter: Payer: Self-pay | Admitting: Physician Assistant

## 2017-12-22 ENCOUNTER — Ambulatory Visit: Payer: Medicare Other | Admitting: Physician Assistant

## 2017-12-22 VITALS — BP 110/60 | HR 72 | Temp 97.8°F | Resp 16 | Ht 65.0 in | Wt 135.0 lb

## 2017-12-22 DIAGNOSIS — J439 Emphysema, unspecified: Secondary | ICD-10-CM

## 2017-12-22 DIAGNOSIS — J9611 Chronic respiratory failure with hypoxia: Secondary | ICD-10-CM | POA: Diagnosis not present

## 2017-12-22 DIAGNOSIS — J988 Other specified respiratory disorders: Secondary | ICD-10-CM

## 2017-12-22 DIAGNOSIS — B9689 Other specified bacterial agents as the cause of diseases classified elsewhere: Secondary | ICD-10-CM

## 2017-12-22 MED ORDER — AZITHROMYCIN 250 MG PO TABS: ORAL_TABLET | ORAL | 0 refills | Status: DC

## 2017-12-22 NOTE — Progress Notes (Signed)
Patient ID: SHAGUN WORDELL MRN: [id number], DOB: Jul 11, 1945, 72 y.o. Date of Encounter: 12/22/2017, 2:44 PM    Chief Complaint:  Chief Complaint  Patient presents with  . Wheezing  . Cough    greenish/brown phlegm  . chest congestion     HPI: 72 y.o. year old female presents with above.   She is currently wearing her nasal cannula oxygen. She reports that she has been having increased phlegm compared to her usual baseline and that the phlegm has been greenish-brown. Her husband has recently had hospitalization and is in facility for rehab.  Says that she needs some treatment so that she can continue to help care for him.  She has had no fevers or chills.     Home Meds:   Outpatient Medications Prior to Visit  Medication Sig Dispense Refill  . acetaminophen (TYLENOL) 325 MG tablet Take 650 mg by mouth every 6 (six) hours as needed for mild pain.     Marland Kitchen albuterol (PROVENTIL HFA;VENTOLIN HFA) 108 (90 Base) MCG/ACT inhaler Inhale 2 puffs into the lungs every 4 (four) hours as needed for wheezing or shortness of breath. 1 Inhaler 0  . apixaban (ELIQUIS) 5 MG TABS tablet Take 1 tablet (5 mg total) by mouth 2 (two) times daily. 60 tablet 11  . diltiazem (CARDIZEM CD) 240 MG 24 hr capsule Take 1 capsule (240 mg total) by mouth daily. 90 capsule 3  . hydrochlorothiazide (MICROZIDE) 12.5 MG capsule Take 2 capsules (25 mg total) by mouth daily. 180 capsule 3  . ipratropium-albuterol (DUONEB) 0.5-2.5 (3) MG/3ML SOLN Take 3 mLs by nebulization every 6 (six) hours as needed. 360 mL 0  . methocarbamol (ROBAXIN) 500 MG tablet Take 1 tablet (500 mg total) by mouth every 8 (eight) hours as needed for muscle spasms. 30 tablet 0  . potassium chloride SA (K-DUR,KLOR-CON) 20 MEQ tablet Take 1 tablet (20 mEq total) by mouth daily. 90 tablet 3   No facility-administered medications prior to visit.     Allergies: No Known Allergies    Review of Systems: See HPI for pertinent ROS. All other  ROS negative.    Physical Exam: Blood pressure 110/60, pulse 72, temperature 97.8 F (36.6 C), temperature source Oral, resp. rate 16, height 5\' 5"  (1.651 m), weight 61.2 kg (135 lb), SpO2 98 %., Body mass index is 22.47 kg/m. General: WNWD WF Wearing [place] Oxygen.  Appears in no acute distress. Neck: Supple. No thyromegaly. No lymphadenopathy. Lungs: Distant, decreased breath sounds throughout bilaterally but no active wheezing. Heart: Regular rhythm. No murmurs, rubs, or gallops. Msk:  Strength and tone normal for age. Extremities/Skin: Warm and dry.  Neuro: Alert and oriented X 3. Moves all extremities spontaneously. Gait is normal. CNII-XII grossly in tact. Psych:  Responds to questions appropriately with a normal affect.     ASSESSMENT AND PLAN:  72 y.o. year old female with   1. Pulmonary emphysema, unspecified emphysema type ([place])  2. Chronic respiratory failure with hypoxia (HCC)   3. Bacterial respiratory infection Reviewed with her that I am hearing no wheezing on exam.  She reports that she has had no uncontrolled wheezing over the last few days.  Says that anytime she feels wheezing she uses her albuterol and that is working to control that.  At this time I do not think she needs prednisone.  Will just add antibiotic.  She is to take the antibiotic as directed.  Continue her pulmonary medications and nasal cannula oxygen.  Follow-up if needed. - azithromycin (ZITHROMAX) 250 MG tablet; Day 1: Take 2 daily.  Days 2-5: Take 1 daily.  Dispense: 6 tablet; Refill: 0   Signed, [address] [place], [place], [facility] 12/22/2017 2:44 PM

## 2017-12-26 NOTE — Telephone Encounter (Signed)
LMTCB

## 2017-12-26 NOTE — Telephone Encounter (Signed)
OK to restart Symbicort for now. We can revisit at [facility]

## 2017-12-28 NOTE — Telephone Encounter (Signed)
Spoke with Katharine Look at [employer], [employer] is covered on [employer]. [facility] was contacted and made aware of this, pharmacists informed us that the patient attempted to refill her symbicort too soon and that if she needed it earlier she would need a PA initiated. Left message per DPR informing patient that her symbicort is on her formulary and that she requested a refill too soon and to contact the office if she needs samples to hold her over until her refill are available from the pharmacy. Nothing further needed at this time.

## 2018-01-04 ENCOUNTER — Other Ambulatory Visit: Payer: Self-pay | Admitting: Adult Health

## 2018-01-04 DIAGNOSIS — J441 Chronic obstructive pulmonary disease with (acute) exacerbation: Secondary | ICD-10-CM

## 2018-01-04 NOTE — Telephone Encounter (Signed)
Spoke with patient today-states she did not like Trelegy and wants to stay on Symbicort 160/4.5. Rx has been sent to pharmacy and routed to [place] as [place].

## 2018-01-18 ENCOUNTER — Ambulatory Visit: Payer: Medicare Other | Admitting: Physician Assistant

## 2018-01-18 ENCOUNTER — Encounter: Payer: Self-pay | Admitting: Physician Assistant

## 2018-01-18 ENCOUNTER — Ambulatory Visit: Payer: Self-pay | Admitting: Physician Assistant

## 2018-01-18 VITALS — BP 124/78 | HR 75 | Temp 98.3°F | Resp 18 | Ht 65.0 in | Wt 131.6 lb

## 2018-01-18 DIAGNOSIS — J9611 Chronic respiratory failure with hypoxia: Secondary | ICD-10-CM

## 2018-01-18 DIAGNOSIS — B9689 Other specified bacterial agents as the cause of diseases classified elsewhere: Secondary | ICD-10-CM

## 2018-01-18 DIAGNOSIS — J988 Other specified respiratory disorders: Secondary | ICD-10-CM | POA: Diagnosis not present

## 2018-01-18 DIAGNOSIS — J441 Chronic obstructive pulmonary disease with (acute) exacerbation: Secondary | ICD-10-CM | POA: Diagnosis not present

## 2018-01-18 MED ORDER — AZITHROMYCIN 250 MG PO TABS: ORAL_TABLET | ORAL | 0 refills | Status: DC

## 2018-01-18 MED ORDER — PREDNISONE 20 MG PO TABS: 20.0000 mg | ORAL_TABLET | Freq: Every day | ORAL | 0 refills | Status: DC

## 2018-01-18 NOTE — Progress Notes (Signed)
Patient ID: Diane Patterson MRN: [id number], DOB: [birth year], 72 y.o. Date of Encounter: 01/18/2018, 3:52 PM    Chief Complaint:  Chief Complaint  Patient presents with  . Wheezing  . yellowish phlegm     HPI: 72 y.o. year old female presents with above.   She has COPD.  She is on nasal cannula oxygen.  Also uses Symbicort and DuoNeb and albuterol.  She reports that she has recently been having increased amount of phlegm and increased wheezing.  Has had to use her albuterol some recently.  Has had no fevers or chills.  No nasal congestion or mucus from the nose.  No other symptoms or concerns.     Home Meds:   Outpatient Medications Prior to Visit  Medication Sig Dispense Refill  . acetaminophen (TYLENOL) 325 MG tablet Take 650 mg by mouth every 6 (six) hours as needed for mild pain.     Marland Kitchen albuterol (PROVENTIL HFA;VENTOLIN HFA) 108 (90 Base) MCG/ACT inhaler Inhale 2 puffs into the lungs every 4 (four) hours as needed for wheezing or shortness of breath. 1 Inhaler 0  . apixaban (ELIQUIS) 5 MG TABS tablet Take 1 tablet (5 mg total) by mouth 2 (two) times daily. 60 tablet 11  . diltiazem (CARDIZEM CD) 240 MG 24 hr capsule Take 1 capsule (240 mg total) by mouth daily. 90 capsule 3  . hydrochlorothiazide (MICROZIDE) 12.5 MG capsule Take 2 capsules (25 mg total) by mouth daily. 180 capsule 3  . ipratropium-albuterol (DUONEB) 0.5-2.5 (3) MG/3ML SOLN Take 3 mLs by nebulization every 6 (six) hours as needed. 360 mL 0  . potassium chloride SA (K-DUR,KLOR-CON) 20 MEQ tablet Take 1 tablet (20 mEq total) by mouth daily. 90 tablet 3  . SYMBICORT 160-4.5 MCG/ACT inhaler INHALE 2 PUFFS INTO THE LUNGS TWICE DAILY 10.2 g 1  . azithromycin (ZITHROMAX) 250 MG tablet Day 1: Take 2 daily.  Days 2-5: Take 1 daily. 6 tablet 0  . methocarbamol (ROBAXIN) 500 MG tablet Take 1 tablet (500 mg total) by mouth every 8 (eight) hours as needed for muscle spasms. 30 tablet 0   No facility-administered  medications prior to visit.     Allergies: No Known Allergies    Review of Systems: See HPI for pertinent ROS. All other ROS negative.    Physical Exam: Blood pressure 124/78, pulse 75, temperature 98.3 F (36.8 C), temperature source Oral, resp. rate 18, height 5\' 5"  (1.651 m), weight 59.7 kg (131 lb 9.6 oz), SpO2 99 %., Body mass index is 21.9 kg/m. General:  WNWD WF. Appears in no acute distress.  Neck: Supple. No thyromegaly. No lymphadenopathy. Lungs: Breath sounds are distant and diminished throughout bilaterally.  However I hear no active wheezes rhonchi or rales. Heart: Regular rhythm. No murmurs, rubs, or gallops. Msk:  Strength and tone normal for age. Extremities/Skin: Warm and dry.  Neuro: Alert and oriented X 3. Moves all extremities spontaneously. Gait is normal. CNII-XII grossly in tact. Psych:  Responds to questions appropriately with a normal affect.     ASSESSMENT AND PLAN:  72 y.o. year old female with  1. COPD exacerbation ([place]) She is to take the antibiotic and prednisone as directed.  Also continue the nasal cannula oxygen DuoNeb and Symbicort and continue albuterol if needed.  Follow-up if symptoms worsen or do not return to normal baseline. - azithromycin (ZITHROMAX) 250 MG tablet; Day 1: Take 2 daily.  Days 2-5: Take 1 daily.  Dispense: 6 tablet; Refill: 0 -  predniSONE (DELTASONE) 20 MG tablet; Take 1 tablet (20 mg total) by mouth daily with breakfast.  Dispense: 5 tablet; Refill: 0  2. Chronic respiratory failure with hypoxia [facility])   Signed, [address] [place], [place], [facility] 01/18/2018 3:52 PM

## 2018-01-24 ENCOUNTER — Ambulatory Visit: Payer: Medicare Other | Admitting: Emergency Medicine

## 2018-01-30 ENCOUNTER — Other Ambulatory Visit: Payer: Self-pay

## 2018-01-30 MED ORDER — METHOCARBAMOL 500 MG PO TABS: 500.0000 mg | ORAL_TABLET | Freq: Three times a day (TID) | ORAL | 1 refills | Status: DC | PRN

## 2018-01-30 NOTE — Telephone Encounter (Signed)
Patient is requesting a refill on Robaxin. She had stopped using it but states she pulled a muscle when lifting her oxygen tank. Patient states she is not able to schedule an appointment her husband just got home from rehab and he is not able to leave.Pls advise if it is ok for patient to get a refill   Last refill 2/25 Ok to refill?

## 2018-01-31 ENCOUNTER — Telehealth: Payer: Self-pay | Admitting: Emergency Medicine

## 2018-01-31 NOTE — Telephone Encounter (Signed)
PA request rec'd 01/30/18 for Symbicort 160-4.84mcg aersol  PA initiated today via [email or web address] Key: [id number] Case ID: [id number] - Rx #: [id number]   Routing message to Ria Comment to f/u on PA

## 2018-02-02 NOTE — Telephone Encounter (Signed)
Spoke with [facility] PA department. This medication does not need a PA at this time. Case #: [id number]. Nothing further is needed at this time.

## 2018-02-02 NOTE — Telephone Encounter (Addendum)
Checked Cover My Meds. [place] could not be completed, received the following message >> [employer] Prior Authorization Department does not manage Prior Authorizations for this plan. Please contact member services phone number on the back of the member ID card.  Pt's insurance is going to need to be contacted by phone. [employer] [phone]. Pt's ID: [id number].

## 2018-02-06 ENCOUNTER — Encounter: Payer: Self-pay | Admitting: Physician Assistant

## 2018-02-06 ENCOUNTER — Ambulatory Visit: Payer: Medicare Other | Admitting: Physician Assistant

## 2018-02-06 VITALS — BP 118/82 | HR 100 | Temp 97.9°F | Resp 16 | Ht 65.0 in | Wt 129.2 lb

## 2018-02-06 DIAGNOSIS — K5900 Constipation, unspecified: Secondary | ICD-10-CM

## 2018-02-06 DIAGNOSIS — M545 Low back pain, unspecified: Secondary | ICD-10-CM

## 2018-02-06 MED ORDER — HYDROCODONE-ACETAMINOPHEN 5-325 MG PO TABS: 1.0000 | ORAL_TABLET | Freq: Four times a day (QID) | ORAL | 0 refills | Status: DC | PRN

## 2018-02-06 NOTE — Progress Notes (Signed)
Patient ID: Diane Patterson MRN: [id number], DOB: Jan 08, 1946, 72 y.o. Date of Encounter: 02/06/2018, 11:51 AM    Chief Complaint:  Chief Complaint  Patient presents with  . pulled muscle in back  . Constipation     HPI: 72 y.o. year old female presents with above.   She has been having low back pain at different areas at different times.   Says that at times, it is the right low back,  sometimes it is more towards the middle,  and sometimes is the left low back.   States that it started about a week ago when she had lifted her oxygen tank to put it in the backseat of the car.   Says that she "just lifted it up to put it in the car without much thought, but then realized" that she had caused some muscle pain.   States that "last night she slept in a lift chair, which is not very good for sleeping, but she slept there to be where she could help monitor her husband and says that that has gotten her low back aggravated again". Currently has been using Tylenol.  Has only used the methocarbamol a couple of times because is afraid that will worsen her constipation.   She is requesting some pain pill to use.  She also reports that she is having some constipation recently.  States that this is a new problem and that this has not happened intermittently for her in the past.   Says that she "knows what is doing it.  Says it is because she has been grabbing fast food recently because her husband had been at the hospital and then a rehab facility.   Says that usually she is careful about what she eats and cooks healthy foods at home.  Says that recently she "has been grabbing hamburger here,  and a hot dog there--- and knows that is what is causing this."  States that she used Miralax with apple juice---- 1 cap of Miralax at each dose ----and did this twice.  Then she used an Ex-Lax yesterday.  Says that she finally had small stool last night and another small amount this morning.  Has had no  significant amount of abdominal pain.  No fevers or chills.  No vomiting.     Home Meds:   Outpatient Medications Prior to Visit  Medication Sig Dispense Refill  . acetaminophen (TYLENOL) 325 MG tablet Take 650 mg by mouth every 6 (six) hours as needed for mild pain.     Marland Kitchen albuterol (PROVENTIL HFA;VENTOLIN HFA) 108 (90 Base) MCG/ACT inhaler Inhale 2 puffs into the lungs every 4 (four) hours as needed for wheezing or shortness of breath. 1 Inhaler 0  . apixaban (ELIQUIS) 5 MG TABS tablet Take 1 tablet (5 mg total) by mouth 2 (two) times daily. 60 tablet 11  . diltiazem (CARDIZEM CD) 240 MG 24 hr capsule Take 1 capsule (240 mg total) by mouth daily. 90 capsule 3  . hydrochlorothiazide (MICROZIDE) 12.5 MG capsule Take 2 capsules (25 mg total) by mouth daily. 180 capsule 3  . ipratropium-albuterol (DUONEB) 0.5-2.5 (3) MG/3ML SOLN Take 3 mLs by nebulization every 6 (six) hours as needed. 360 mL 0  . methocarbamol (ROBAXIN) 500 MG tablet Take 1 tablet (500 mg total) by mouth every 8 (eight) hours as needed for muscle spasms. 30 tablet 1  . potassium chloride SA (K-DUR,KLOR-CON) 20 MEQ tablet Take 1 tablet (20 mEq total) by mouth daily.  90 tablet 3  . SYMBICORT 160-4.5 MCG/ACT inhaler INHALE 2 PUFFS INTO THE LUNGS TWICE DAILY 10.2 g 1  . azithromycin (ZITHROMAX) 250 MG tablet Day 1: Take 2 daily.  Days 2-5: Take 1 daily. 6 tablet 0  . predniSONE (DELTASONE) 20 MG tablet Take 1 tablet (20 mg total) by mouth daily with breakfast. 5 tablet 0   No facility-administered medications prior to visit.     Allergies: No Known Allergies    Review of Systems: See HPI for pertinent ROS. All other ROS negative.    Physical Exam: Blood pressure 118/82, pulse 100, temperature 97.9 F (36.6 C), temperature source Oral, resp. rate 16, height 5\' 5"  (1.651 m), weight 58.6 kg, SpO2 98 %., Body mass index is 21.5 kg/m. General:  WNWD WF. Appears in no acute distress. Neck: Supple. No thyromegaly. No  lymphadenopathy. Lungs: Clear bilaterally to auscultation without wheezes, rales, or rhonchi. Breathing is unlabored. Heart: Regular rhythm. No murmurs, rubs, or gallops. Abdomen: Soft, non-tender, non-distended with normoactive bowel sounds. No hepatomegaly. No rebound/guarding. No obvious abdominal masses. Msk:  Strength and tone normal for age. Extremities/Skin: Warm and dry.  Neuro: Alert and oriented X 3. Moves all extremities spontaneously. Gait is normal. CNII-XII grossly in tact. Psych:  Responds to questions appropriately with a normal affect.     ASSESSMENT AND PLAN:  72 y.o. year old female with   1. Acute bilateral low back pain without sciatica She can use the hydrocodone as directed for significant pain. - HYDROcodone-acetaminophen (NORCO/VICODIN) 5-325 MG tablet; Take 1 tablet by mouth every 6 (six) hours as needed for severe pain.  Dispense: 30 tablet; Refill: 0  2. Constipation, unspecified constipation type I have recommended for her to use Miralax--- 2 capfuls one time each day and monitor stools.  Discussed that sometimes it takes a couple days to see the effect of the medication--- so just stick with 2 capfuls per day for the next couple of days and then can adjust dose if needed.  Call here if needs assistance adjusting dose.  Discussed then adjusting dose depending on her stools.  Voices understanding and agrees.   Signed, [address] [place], [place], [facility] 02/06/2018 11:51 AM

## 2018-02-20 ENCOUNTER — Ambulatory Visit (INDEPENDENT_AMBULATORY_CARE_PROVIDER_SITE_OTHER)
Admission: RE | Admit: 2018-02-20 | Discharge: 2018-02-20 | Disposition: A | Discharge status: Home / Self Care | Payer: Medicare Other | Source: Ambulatory Visit | Attending: Pulmonary Disease | Admitting: Pulmonary Disease

## 2018-02-20 ENCOUNTER — Ambulatory Visit: Payer: Medicare Other | Admitting: Pulmonary Disease

## 2018-02-20 ENCOUNTER — Encounter: Payer: Self-pay | Admitting: Pulmonary Disease

## 2018-02-20 DIAGNOSIS — J9611 Chronic respiratory failure with hypoxia: Secondary | ICD-10-CM | POA: Diagnosis not present

## 2018-02-20 DIAGNOSIS — J441 Chronic obstructive pulmonary disease with (acute) exacerbation: Secondary | ICD-10-CM

## 2018-02-20 DIAGNOSIS — J439 Emphysema, unspecified: Secondary | ICD-10-CM

## 2018-02-20 MED ORDER — PREDNISONE 10 MG PO TABS: ORAL_TABLET | ORAL | 0 refills | Status: DC

## 2018-02-20 MED ORDER — BUDESONIDE-FORMOTEROL FUMARATE 160-4.5 MCG/ACT IN AERO: 2.0000 | INHALATION_SPRAY | Freq: Two times a day (BID) | RESPIRATORY_TRACT | 5 refills | Status: DC

## 2018-02-20 MED ORDER — AZITHROMYCIN 250 MG PO TABS: ORAL_TABLET | ORAL | 0 refills | Status: DC

## 2018-02-20 MED ORDER — ALBUTEROL SULFATE HFA 108 (90 BASE) MCG/ACT IN AERS: 2.0000 | INHALATION_SPRAY | RESPIRATORY_TRACT | 5 refills | Status: DC | PRN

## 2018-02-20 MED ORDER — LEVALBUTEROL HCL 0.63 MG/3ML IN NEBU
0.6300 mg | INHALATION_SOLUTION | Freq: Once | RESPIRATORY_TRACT | Status: AC
  Administered 2018-02-20: 0.63 mg via RESPIRATORY_TRACT

## 2018-02-20 MED ORDER — TIOTROPIUM BROMIDE MONOHYDRATE 2.5 MCG/ACT IN AERS: 2.0000 | INHALATION_SPRAY | Freq: Every day | RESPIRATORY_TRACT | 3 refills | Status: DC

## 2018-02-20 NOTE — Patient Instructions (Addendum)
Xopenex breathing treatment  Prednisone 10mg  tablet  >>>4 tabs for 2 days, then 3 tabs for 2 days, 2 tabs for 2 days, then 1 tab for 2 days, then stop >>>take with food  >>>take in the morning   Azithromycin 250mg  tablet  >>>Take 2 tablets (500mg  total) today, and then 1 tablet (250mg ) for the next four days  >>>take with food  >>>can also take probiotic and / or yogurt while on antibiotic   Spiriva Respimat 2.5 >>> 2 puffs daily >>> Do this every day >>>This is not a rescue inhaler  Continue Symbicort 160 >>> 2 puffs in the morning right when you wake up, rinse out your mouth after use, 12 hours later 2 puffs, rinse after use >>> Take this daily, no matter what >>> This is not a rescue inhaler   Chest xray today   It is flu season:   >>>Remember to be washing your hands regularly, using hand sanitizer, be careful to use around herself with has contact with people who are sick will increase her chances of getting sick yourself. >>> Best ways to protect herself from the flu: Receive the yearly flu vaccine, practice good hand hygiene washing with soap and also using hand sanitizer when available, eat a nutritious meals, get adequate rest, hydrate appropriately   Please contact the office if your symptoms worsen or you have concerns that you are not improving.   Thank you for choosing [facility] for your healthcare, and for allowing [place] to partner with you on your healthcare journey. I am thankful to be able to provide care to you today.   Wyn Quaker FNP-C    Home Oxygen Use, Adult When a medical condition keeps you from getting enough oxygen, your health care provider may instruct you to take extra oxygen at home. Your health care provider will let you know:  When to take oxygen.  For how long to take oxygen.  How quickly oxygen should be delivered (flow rate), in liters per minute (LPM or L/M).  Home oxygen can be given through:  A mask.  A nasal cannula.  This is a device or tube that goes in the nostrils.  A transtracheal catheter. This is a small, flexible tube placed in the trachea.  A tracheostomy. This is a surgically made opening in the trachea.  These devices are connected with tubing to an oxygen source, such as:  A tank. Tanks hold oxygen in gas form. They must be replaced when the oxygen is used up.  A liquid oxygen device. This holds oxygen in liquid form. It must be replaced when the oxygen is used up.  An oxygen concentrator machine. This filters oxygen in the room. It uses electricity, so you must have a backup cylinder of oxygen in case the power goes out.  Supplies needed: To use oxygen, you will need:  A mask, nasal cannula, transtracheal catheter, or tracheostomy.  An oxygen tank, a liquid oxygen device, or an oxygen concentrator.  The tape that your health care provider recommends (optional).  If you use a transtracheal catheter and your prescribed flow rate is 1 LPM or greater, you will also need a humidifier. Risks and complications  Fire. This can happen if the oxygen is exposed to a heat source, flame, or spark.  Injury to skin. This can happen if liquid oxygen touches your skin.  Organ damage. This can happen if you get too little oxygen. How to use oxygen Your health care provider will  show you how to use your oxygen device. Follow her or his instructions. They may look something like this: 1. Wash your hands. 2. If you use an oxygen concentrator, make sure it is plugged in. 3. Place one end of the tube into the port on the tank, device, or machine. 4. Place the mask over your nose and mouth. Or, place the nasal cannula and secure it with tape if instructed. If you use a tracheostomy or transtracheal catheter, connect it to the oxygen source as directed. 5. Make sure the liter-flow setting on the machine is at the level prescribed by your health care provider. 6. Turn on the machine or adjust the knob on  the tank or device to the correct liter-flow setting. 7. When you are done, turn off and unplug the machine, or turn the knob to OFF.  How to clean and care for the oxygen supplies Nasal cannula  Clean it with a warm, wet cloth daily or as needed.  Wash it with a liquid soap once a week.  Rinse it thoroughly once or twice a week.  Replace it every 2-4 weeks.  If you have an infection, such as a cold or pneumonia, change the cannula when you get better. Mask  Replace it every 2-4 weeks.  If you have an infection, such as a cold or pneumonia, change the mask when you get better. Humidifier bottle  Wash the bottle between each refill: ? Wash it with soap and warm water. ? Rinse it thoroughly. ? Disinfect it and its top. ? Air-dry it.  Make sure it is dry before you refill it. Oxygen concentrator  Clean the air filter at least twice a week according to directions from your home medical equipment and service company.  Wipe down the cabinet every day. To do this: ? Unplug the unit. ? Wipe down the cabinet with a damp cloth. ? Dry the cabinet. Other equipment  Change any extra tubing every 1-3 months.  Follow instructions from your health care provider about taking care of any other equipment. Safety tips Fire safety tips   Keep your oxygen and oxygen supplies at least 5 ft away from sources of heat, flames, and sparks at all times.  Do not allow smoking near your oxygen. Put up "no smoking" signs in your home.  Do not use materials that can burn (are flammable) while you use oxygen.  When you go to a restaurant with portable oxygen, ask to be seated in the nonsmoking section.  Keep a Data processing manager close by. Let your fire department know that you have oxygen in your home.  Test your home smoke detectors regularly. General safety tips  If you use an oxygen cylinder, make sure it is in a stand or secured to an object that will not move (fixed object).  If you  use liquid oxygen, make sure its container is kept upright.  If you use an oxygen concentrator: ? [employer] movement psychotherapist company. Make sure you are given priority service in the event that your power goes out. ? Avoid using extension cords, if possible. Follow these instructions at home:  Use oxygen only as told by your health care provider.  Do not use alcohol or other drugs that make you relax (sedating drugs) unless instructed. They can slow down your breathing rate and make it hard to get in enough oxygen.  Know how and when to order a refill of oxygen.  Always keep a spare tank of oxygen. Plan  ahead for holidays when you may not be able to get a prescription filled.  Use water-based lubricants on your lips or nostrils. Do not use oil-based products like petroleum jelly.  To prevent skin irritation on your cheeks or behind your ears, tuck some gauze under the tubing. Contact a health care provider if:  You get headaches often.  You have shortness of breath.  You have a lasting cough.  You have anxiety.  You are sleepy all the time.  You develop an illness that affects your breathing.  You cannot exercise at your regular level.  You are restless.  You have difficult or irregular breathing, and it is getting worse.  You have a fever.  You have persistent redness under your nose. Get help right away if:  You are confused.  You have blue lips or fingernails.  You are struggling to breathe. This information is not intended to replace advice given to you by your health care provider. Make sure you discuss any questions you have with your health care provider. Document Released: 08/21/2003 Document Revised: 01/28/2016 Document Reviewed: 12/23/2015 Elsevier Interactive Patient Education  2018 [employer]  Chronic Obstructive Pulmonary Disease Chronic obstructive pulmonary disease (COPD) is a long-term (chronic) lung problem. When you have COPD, it is hard for air to  get in and out of your lungs. The way your lungs work will never return to normal. Usually the condition gets worse over time. There are things you can do to keep yourself as healthy as possible. Your doctor may treat your condition with:  Medicines.  Quitting smoking, if you smoke.  Rehabilitation. This may involve a team of specialists.  Oxygen.  Exercise and changes to your diet.  Lung surgery.  Comfort measures (palliative care).  Follow these instructions at home: Medicines  Take over-the-counter and prescription medicines only as told by your doctor.  Talk to your doctor before taking any cough or allergy medicines. You may need to avoid medicines that cause your lungs to be dry. Lifestyle  If you smoke, stop. Smoking makes the problem worse. If you need help quitting, ask your doctor.  Avoid being around things that make your breathing worse. This may include smoke, chemicals, and fumes.  Stay active, but remember to also rest.  Learn and use tips on how to relax.  Make sure you get enough sleep. Most adults need at least 7 hours a night.  Eat healthy foods. Eat smaller meals more often. Rest before meals. Controlled breathing  Learn and use tips on how to control your breathing as told by your doctor. Try: ? Breathing in (inhaling) through your nose for 1 second. Then, pucker your lips and breath out (exhale) through your lips for 2 seconds. ? Putting one hand on your belly (abdomen). Breathe in slowly through your nose for 1 second. Your hand on your belly should move out. Pucker your lips and breathe out slowly through your lips. Your hand on your belly should move in as you breathe out. Controlled coughing  Learn and use controlled coughing to clear mucus from your lungs. The steps are: 1. Lean your head a little forward. 2. Breathe in deeply. 3. Try to hold your breath for 3 seconds. 4. Keep your mouth slightly open while coughing 2 times. 5. Spit any mucus  out into a tissue. 6. Rest and do the steps again 1 or 2 times as needed. General instructions  Make sure you get all the shots (vaccines) that your doctor  recommends. Ask your doctor about a flu shot and a pneumonia shot.  Use oxygen therapy and therapy to help improve your lungs (pulmonary rehabilitation) if told by your doctor. If you need home oxygen therapy, ask your doctor if you should buy a tool to measure your oxygen level (oximeter).  Make a COPD action plan with your doctor. This helps you know what to do if you feel worse than usual.  Manage any other conditions you have as told by your doctor.  Avoid going outside when it is very hot, cold, or humid.  Avoid people who have a sickness you can catch (contagious).  Keep all follow-up visits as told by your doctor. This is important. Contact a doctor if:  You cough up more mucus than usual.  There is a change in the color or thickness of the mucus.  It is harder to breathe than usual.  Your breathing is faster than usual.  You have trouble sleeping.  You need to use your medicines more often than usual.  You have trouble doing your normal activities such as getting dressed or walking around the house. Get help right away if:  You have shortness of breath while resting.  You have shortness of breath that stops you from: ? Being able to talk. ? Doing normal activities.  Your chest hurts for longer than 5 minutes.  Your skin color is more blue than usual.  Your pulse oximeter shows that you have low oxygen for longer than 5 minutes.  You have a fever.  You feel too tired to breathe normally. Summary  Chronic obstructive pulmonary disease (COPD) is a long-term lung problem.  The way your lungs work will never return to normal. Usually the condition gets worse over time. There are things you can do to keep yourself as healthy as possible.  Take over-the-counter and prescription medicines only as told by your  doctor.  If you smoke, stop. Smoking makes the problem worse. This information is not intended to replace advice given to you by your health care provider. Make sure you discuss any questions you have with your health care provider. Document Released: 11/17/2007 Document Revised: 11/06/2015 Document Reviewed: 01/25/2013 Elsevier Interactive Patient Education  2017 [employer].

## 2018-02-20 NOTE — Assessment & Plan Note (Signed)
Xopenex breathing treatment  Prednisone 10mg  tablet  >>>4 tabs for 2 days, then 3 tabs for 2 days, 2 tabs for 2 days, then 1 tab for 2 days, then stop >>>take with food  >>>take in the morning   Azithromycin 250mg  tablet  >>>Take 2 tablets (500mg  total) today, and then 1 tablet (250mg ) for the next four days  >>>take with food  >>>can also take probiotic and / or yogurt while on antibiotic   Spiriva Respimat 2.5 >>> 2 puffs daily >>> Do this every day >>>This is not a rescue inhaler  Continue Symbicort 160 >>> 2 puffs in the morning right when you wake up, rinse out your mouth after use, 12 hours later 2 puffs, rinse after use >>> Take this daily, no matter what >>> This is not a rescue inhaler   Chest xray today

## 2018-02-20 NOTE — Progress Notes (Signed)
LMTCB x1 on preferred phone number listed for patient.  

## 2018-02-20 NOTE — Progress Notes (Signed)
@Patient  ID: Diane Patterson, female    DOB: [birth year], 72 y.o.   MRN: [id number]  Chief Complaint  Patient presents with  . Follow-up    COPD follow-up    Referring provider: Rennis Golden  HPI:  72 year old female former smoker followed in our office for fixed asthma/COPD.  PMH: Afib (eliquis)  Smoker/ Smoking History: Former smoker. 11.25 pack years. Maintenance: Symbicort 160 Pt of: Dr. Lamonte Sakai  02/20/2018  - Visit   72 year old patient presenting today for follow-up visit.  Patient reports that she has been doing okay on her Symbicort inhaler regimen.  Patient does feel she has had increased shortness of breath over the past couple weeks.  Patient also reports increased congestion.  Patient with cough with productive yellow to green mucus.  Patient reports adherence to Symbicort inhaler regimen as well as rescue inhaler.  MMRC - Breathlessness Score 4 - I am too breathless to leave the house or I am breathlessness when dressing  Tests:   07/25/2017- emphysema, no acute abnormality seen 07/15/2017-echocardiogram-LV ejection fraction 65 to 70%, mild LVH 04/29/2015-spirometry- ratio 52, FEV1 32%, severe obstruction with low vital capacity  Chart Review:     Specialty Problems      Pulmonary Problems   COPD (chronic obstructive pulmonary disease) (HCC)    Gold C FEV1  27% in 12/09       COPD exacerbation (HCC)   Allergic rhinitis   Chronic respiratory failure with hypoxia (HCC)      No Known Allergies  Immunization History  Administered Date(s) Administered  . Influenza Split 03/15/2011, 03/06/2012  . Influenza Whole 03/12/2009, 02/10/2010  . Influenza, High Dose Seasonal PF 04/01/2015, 03/05/2016, 03/18/2017  . Influenza,inj,Quad PF,6+ Mos 03/30/2013, 03/11/2014, 03/19/2017  . Pneumococcal Conjugate-13 11/14/2013  . Pneumococcal Polysaccharide-23 09/11/2010    Past Medical History:  Diagnosis Date  . Asthma   . Chronic airflow obstruction  (HCC)   . Hypertension   . Tobacco abuse   . Venous insufficiency     Tobacco History: Social History   Tobacco Use  Smoking Status Former Smoker  . Packs/day: 0.75  . Years: 15.00  . Pack years: 11.25  . Types: Cigarettes  . Last attempt to quit: 03/20/2014  . Years since quitting: 3.9  Smokeless Tobacco Former Systems developer  . Quit date: 03/11/2014   Counseling given: Yes  Patient to continue not smoking.  Outpatient Encounter Medications as of 02/20/2018  Medication Sig  . acetaminophen (TYLENOL) 325 MG tablet Take 650 mg by mouth every 6 (six) hours as needed for mild pain.   Marland Kitchen albuterol (PROVENTIL HFA;VENTOLIN HFA) 108 (90 Base) MCG/ACT inhaler Inhale 2 puffs into the lungs every 4 (four) hours as needed for wheezing or shortness of breath.  Marland Kitchen apixaban (ELIQUIS) 5 MG TABS tablet Take 1 tablet (5 mg total) by mouth 2 (two) times daily.  . budesonide-formoterol (SYMBICORT) 160-4.5 MCG/ACT inhaler Inhale 2 puffs into the lungs 2 (two) times daily.  Marland Kitchen diltiazem (CARDIZEM CD) 240 MG 24 hr capsule Take 1 capsule (240 mg total) by mouth daily.  . hydrochlorothiazide (MICROZIDE) 12.5 MG capsule Take 2 capsules (25 mg total) by mouth daily.  Marland Kitchen HYDROcodone-acetaminophen (NORCO/VICODIN) 5-325 MG tablet Take 1 tablet by mouth every 6 (six) hours as needed for severe pain.  Marland Kitchen ipratropium-albuterol (DUONEB) 0.5-2.5 (3) MG/3ML SOLN Take 3 mLs by nebulization every 6 (six) hours as needed.  . methocarbamol (ROBAXIN) 500 MG tablet Take 1 tablet (500 mg total) by mouth every  8 (eight) hours as needed for muscle spasms.  . potassium chloride SA (K-DUR,KLOR-CON) 20 MEQ tablet Take 1 tablet (20 mEq total) by mouth daily.  . [DISCONTINUED] albuterol (PROVENTIL HFA;VENTOLIN HFA) 108 (90 Base) MCG/ACT inhaler Inhale 2 puffs into the lungs every 4 (four) hours as needed for wheezing or shortness of breath.  . [DISCONTINUED] SYMBICORT 160-4.5 MCG/ACT inhaler INHALE 2 PUFFS INTO THE LUNGS TWICE DAILY  .  azithromycin (ZITHROMAX) 250 MG tablet 500mg  (two tablets) today, then 250mg  (1 tablet) for the next 4 days  . predniSONE (DELTASONE) 10 MG tablet 4 tabs for 2 days, then 3 tabs for 2 days, 2 tabs for 2 days, then 1 tab for 2 days, then stop  . Tiotropium Bromide Monohydrate (SPIRIVA RESPIMAT) 2.5 MCG/ACT AERS Inhale 2 puffs into the lungs daily.  . [EXPIRED] levalbuterol (XOPENEX) nebulizer solution 0.63 mg    No facility-administered encounter medications on file as of 02/20/2018.      Review of Systems  Review of Systems  Constitutional: Positive for fatigue. Negative for chills, fever and unexpected weight change.  HENT: Negative for congestion, ear pain, postnasal drip, sinus pressure and sinus pain.   Respiratory: Positive for cough (thick yellow / green mucous ), shortness of breath and wheezing (with exertion and laying flat at night ). Negative for chest tightness.   Cardiovascular: Negative for chest pain and palpitations.  Gastrointestinal: Negative for blood in stool, diarrhea, nausea and vomiting.  Genitourinary: Negative for dysuria, frequency and urgency.  Musculoskeletal: Positive for back pain. Negative for arthralgias.  Skin: Negative for color change.  Allergic/Immunologic: Negative for environmental allergies and food allergies.  Neurological: Negative for dizziness, light-headedness and headaches.  Psychiatric/Behavioral: Negative for dysphoric mood. The patient is not nervous/anxious.   All other systems reviewed and are negative.    Physical Exam  BP 132/72 (BP Location: Right Arm, Cuff Size: Normal)   Pulse 68   Ht 5' 6.25" (1.683 m)   Wt 127 lb 6.4 oz (57.8 kg)   LMP  (LMP Unknown)   SpO2 99%   BMI 20.41 kg/m   Wt Readings from Last 5 Encounters:  02/20/18 127 lb 6.4 oz (57.8 kg)  02/06/18 129 lb 3.2 oz (58.6 kg)  01/18/18 131 lb 9.6 oz (59.7 kg)  12/22/17 135 lb (61.2 kg)  11/04/17 134 lb 9.6 oz (61.1 kg)   2L [place] via o2   Physical Exam    Constitutional: She is oriented to person, place, and time and well-developed, well-nourished, and in no distress. No distress.  HENT:  Head: Normocephalic and atraumatic.  Right Ear: Hearing, tympanic membrane, external ear and ear canal normal.  Left Ear: Hearing, tympanic membrane, external ear and ear canal normal.  Mouth/Throat: Uvula is midline and oropharynx is clear and moist. No oropharyngeal exudate.  Eyes: Pupils are equal, round, and reactive to light.  Neck: Normal range of motion. Neck supple. No JVD present.  Cardiovascular: Normal rate, regular rhythm and normal heart sounds.  Pulmonary/Chest: Effort normal. No accessory muscle usage. No respiratory distress. She has no decreased breath sounds. She has wheezes (LUL exp wheeze). She has no rhonchi.  Abdominal: Soft. Bowel sounds are normal. There is no tenderness.  Musculoskeletal: Normal range of motion. She exhibits no edema.  Lymphadenopathy:    She has no cervical adenopathy.  Neurological: She is alert and oriented to person, place, and time. Gait normal.  Skin: Skin is warm and dry. She is not diaphoretic. No erythema.  Psychiatric: Mood,  memory, affect and judgment normal.  Nursing note and vitals reviewed.   Xopenex breathing treatment  Lab Results:  CBC    Component Value Date/Time   WBC 10.4 08/08/2017 0915   WBC 19.9 (H) 07/16/2017 0327   RBC 3.91 08/08/2017 0915   RBC 4.59 07/16/2017 0327   HGB 11.7 08/08/2017 0915   HCT 35.0 08/08/2017 0915   PLT 371 08/08/2017 0915   MCV 90 08/08/2017 0915   MCH 29.9 08/08/2017 0915   MCH 30.1 07/16/2017 0327   MCHC 33.4 08/08/2017 0915   MCHC 32.6 07/16/2017 0327   RDW 14.0 08/08/2017 0915   LYMPHSABS 1.3 07/13/2017 0615   LYMPHSABS 2.1 07/21/2016 1206   MONOABS 1.0 07/13/2017 0615   EOSABS 0.0 07/13/2017 0615   EOSABS 0.4 07/21/2016 1206   BASOSABS 0.0 07/13/2017 0615   BASOSABS 0.0 07/21/2016 1206    BMET    Component Value Date/Time   NA 141  08/08/2017 0915   K 4.0 08/08/2017 0915   CL 95 (L) 08/08/2017 0915   CO2 26 08/08/2017 0915   GLUCOSE 289 (H) 08/08/2017 0915   GLUCOSE 276 (H) 07/16/2017 0327   BUN 18 08/08/2017 0915   CREATININE 0.92 08/08/2017 0915   CALCIUM 9.7 08/08/2017 0915   GFRNONAA 62 08/08/2017 0915   GFRAA 72 08/08/2017 0915    BNP No results found for: BNP  ProBNP    Component Value Date/Time   PROBNP 175.1 (H) 03/03/2014 1232    Imaging: Dg Chest 2 View  Result Date: 02/20/2018 CLINICAL DATA:  COPD exacerbation EXAM: CHEST - 2 VIEW COMPARISON:  07/25/2017 FINDINGS: There is hyperinflation of the lungs compatible with COPD. Scarring in the upper lobes. Heart is normal size. No effusions. Mild compression deformity at the superior endplate of K99, new since prior study. IMPRESSION: COPD/chronic changes.  No active cardiopulmonary disease. New mild compression deformity at the superior endplate of I33. Electronically Signed   By: Rolm Baptise M.D.   On: 02/20/2018 15:31      Assessment & Plan:   Pleasant 72 year old patient seen today for follow-up visit.  Will treat patient for COPD exacerbation with Z-Pak and prednisone.  We will also treat patient with Xopenex breathing treatment today.  Will start patient on Spiriva 2.5.  We will continue patient on Symbicort 160.  Patient declined samples today.  Patient would just like prescription sent in.  Patient follow-up in 3 months or sooner if symptoms are not improving  COPD exacerbation (HCC) Xopenex breathing treatment  Prednisone 10mg  tablet  >>>4 tabs for 2 days, then 3 tabs for 2 days, 2 tabs for 2 days, then 1 tab for 2 days, then stop >>>take with food  >>>take in the morning   Azithromycin 250mg  tablet  >>>Take 2 tablets (500mg  total) today, and then 1 tablet (250mg ) for the next four days  >>>take with food  >>>can also take probiotic and / or yogurt while on antibiotic   Spiriva Respimat 2.5 >>> 2 puffs daily >>> Do this every  day >>>This is not a rescue inhaler  Continue Symbicort 160 >>> 2 puffs in the morning right when you wake up, rinse out your mouth after use, 12 hours later 2 puffs, rinse after use >>> Take this daily, no matter what >>> This is not a rescue inhaler   Chest xray today   COPD (chronic obstructive pulmonary disease) (HCC) Start Spiriva Respimat 2.5 >>> 2 puffs daily >>> Do this every day >>>This is not a  rescue inhaler  Continue Symbicort 160 >>> 2 puffs in the morning right when you wake up, rinse out your mouth after use, 12 hours later 2 puffs, rinse after use >>> Take this daily, no matter what >>> This is not a rescue inhaler   Chest xray today   Chronic respiratory failure with hypoxia (HCC) Continue oxygen therapy as prescribed     Lauraine Rinne, NP 02/20/2018

## 2018-02-20 NOTE — Assessment & Plan Note (Signed)
Start Spiriva Respimat 2.5 >>> 2 puffs daily >>> Do this every day >>>This is not a rescue inhaler  Continue Symbicort 160 >>> 2 puffs in the morning right when you wake up, rinse out your mouth after use, 12 hours later 2 puffs, rinse after use >>> Take this daily, no matter what >>> This is not a rescue inhaler   Chest xray today

## 2018-02-20 NOTE — Assessment & Plan Note (Signed)
Continue oxygen therapy as prescribed 

## 2018-02-20 NOTE — Progress Notes (Signed)
Your chest x-ray results of come back.  Showing no acute changes.  No plan of care changes at this time.  Keep follow-up appointment.    Follow-up with our office if symptoms worsen or you do not feel like you are improving under her current regimen.  It was a pleasure taking care of you,  Brian Mack, FNP 

## 2018-02-22 ENCOUNTER — Encounter: Payer: Self-pay | Admitting: *Deleted

## 2018-02-23 ENCOUNTER — Telehealth: Payer: Self-pay | Admitting: Pulmonary Disease

## 2018-02-23 NOTE — Telephone Encounter (Signed)
Notes recorded by Lauraine Rinne, NP on 02/20/2018 at 4:36 PM EDT Your chest x-ray results of come back. Showing no acute changes. No plan of care changes at this time. Keep follow-up appointment.   Follow-up with our office if symptoms worsen or you do not feel like you are improving under her current regimen.   Called and spoke with pt letting her know the results of the cxr. Pt expressed understanding. Nothing further needed.

## 2018-03-01 ENCOUNTER — Encounter: Payer: Self-pay | Admitting: Physician Assistant

## 2018-03-01 ENCOUNTER — Ambulatory Visit: Payer: Medicare Other | Admitting: Physician Assistant

## 2018-03-01 VITALS — BP 132/78 | HR 70 | Temp 97.7°F | Resp 16 | Ht 66.25 in | Wt 124.4 lb

## 2018-03-01 DIAGNOSIS — R1011 Right upper quadrant pain: Secondary | ICD-10-CM

## 2018-03-01 DIAGNOSIS — R110 Nausea: Secondary | ICD-10-CM | POA: Diagnosis not present

## 2018-03-01 DIAGNOSIS — R634 Abnormal weight loss: Secondary | ICD-10-CM | POA: Diagnosis not present

## 2018-03-01 DIAGNOSIS — R195 Other fecal abnormalities: Secondary | ICD-10-CM

## 2018-03-01 DIAGNOSIS — R1013 Epigastric pain: Secondary | ICD-10-CM

## 2018-03-01 DIAGNOSIS — R194 Change in bowel habit: Secondary | ICD-10-CM

## 2018-03-01 LAB — TSH: TSH: 1.26 mIU/L (ref 0.40–4.50)

## 2018-03-01 LAB — CBC WITH DIFFERENTIAL/PLATELET
BASOS PCT: 0.6 %
Basophils Absolute: 64 cells/uL (ref 0–200)
EOS ABS: 813 {cells}/uL — AB (ref 15–500)
Eosinophils Relative: 7.6 %
HEMATOCRIT: 36.4 % (ref 35.0–45.0)
Hemoglobin: 11.9 g/dL (ref 11.7–15.5)
Lymphs Abs: 2365 cells/uL (ref 850–3900)
MCH: 29 pg (ref 27.0–33.0)
MCHC: 32.7 g/dL (ref 32.0–36.0)
MCV: 88.6 fL (ref 80.0–100.0)
MPV: 10.7 fL (ref 7.5–12.5)
Monocytes Relative: 8.1 %
Neutro Abs: 6591 cells/uL (ref 1500–7800)
Neutrophils Relative %: 61.6 %
PLATELETS: 346 10*3/uL (ref 140–400)
RBC: 4.11 10*6/uL (ref 3.80–5.10)
RDW: 13 % (ref 11.0–15.0)
TOTAL LYMPHOCYTE: 22.1 %
WBC: 10.7 10*3/uL (ref 3.8–10.8)
WBCMIX: 867 {cells}/uL (ref 200–950)

## 2018-03-01 LAB — COMPLETE METABOLIC PANEL WITH GFR
AG RATIO: 1.8 (calc) (ref 1.0–2.5)
ALBUMIN MSPROF: 4.3 g/dL (ref 3.6–5.1)
ALT: 11 U/L (ref 6–29)
AST: 13 U/L (ref 10–35)
Alkaline phosphatase (APISO): 81 U/L (ref 33–130)
BILIRUBIN TOTAL: 0.4 mg/dL (ref 0.2–1.2)
BUN / CREAT RATIO: 24 (calc) — AB (ref 6–22)
BUN: 25 mg/dL (ref 7–25)
CHLORIDE: 101 mmol/L (ref 98–110)
CO2: 31 mmol/L (ref 20–32)
Calcium: 9.6 mg/dL (ref 8.6–10.4)
Creat: 1.05 mg/dL — ABNORMAL HIGH (ref 0.60–0.93)
GFR, EST AFRICAN AMERICAN: 61 mL/min/{1.73_m2} (ref >=60)
GFR, Est Non African American: 53 mL/min/{1.73_m2} — ABNORMAL LOW (ref >=60)
GLOBULIN: 2.4 g/dL (ref 1.9–3.7)
Glucose, Bld: 126 mg/dL — ABNORMAL HIGH (ref 65–99)
POTASSIUM: 3.5 mmol/L (ref 3.5–5.3)
SODIUM: 141 mmol/L (ref 135–146)
TOTAL PROTEIN: 6.7 g/dL (ref 6.1–8.1)

## 2018-03-01 LAB — LIPASE: LIPASE: 54 U/L (ref 7–60)

## 2018-03-01 LAB — AMYLASE: AMYLASE: 80 U/L (ref 21–101)

## 2018-03-01 MED ORDER — ONDANSETRON HCL 4 MG PO TABS: 4.0000 mg | ORAL_TABLET | Freq: Three times a day (TID) | ORAL | 0 refills | Status: DC | PRN

## 2018-03-01 NOTE — Progress Notes (Signed)
Patient ID: KASYN STOUFFER MRN: [id number], DOB: 04/27/1946, 72 y.o. Date of Encounter: @DATE @  Chief Complaint:  Chief Complaint  Patient presents with  . side and back pain  . Weight Loss  . Nausea    HPI: 72 y.o. year old female  presents with above.  She reports that 3 or 4 weeks ago she noticed that her stool had a different appearance than usual.   Says that it was then "long and skinny "and somewhat of a C shape, curve shaped to it.  That it looked unusual and different than usual.   Says that 2 different times she had BM---2 times I a row---- stools had that appearance--- on 2 occasions ---and then after that "had diarrhea real bad".  States that stools after that / since then---stool was then "in little pieces ".  And after that-- has had no stool for the past couple days. States that she has been feeling nauseous.  Says that the nausea has been going on for about 1-1/2 weeks.  Says that she does not feel nauseous all the time but when she tries to eat and right after she eats then she feels nauseous. I asked about recent dietary intake. Says that today she ate a banana as her breakfast and ate a half of a peanut butter sandwich prior to coming here.  Visit here is is about 12 PM. States that yesterday she ate raisin bran cereal and then she ate some chili beans last night for dinner.  Does not think she ate anything between, does not think she ate anything through the middle of the day.  She has had no actual vomiting. Says that whenever she feels really nauseous, she sits down and it gets better.  Also, one time she used her husband's Zofran 4 mg and her nausea eased off, resolved with that.  "Knows she isn't supposed to use other peoples medicine" Is requesting for me to send in Rx for this med for to use.   Reports that she has not noticed feeling any significant amount of true abdominal pain.  Has felt no area of focal/localized pain in her abdomen.  Rather just this  generalized nausea.  Reviewed weights in chart.  09/07/2017---------------134 pounds 9.6 ounce 10/06/2017----------------135 pound 4 ounce 12/22/2017----------------135 pound 01/18/2018-------------------131 pound 9.6 ounce Today--- 03/01/2018--   124.4 pound      Past Medical History:  Diagnosis Date  . Asthma   . Chronic airflow obstruction (HCC)   . Hypertension   . Tobacco abuse   . Venous insufficiency      Home Meds: Outpatient Medications Prior to Visit  Medication Sig Dispense Refill  . acetaminophen (TYLENOL) 325 MG tablet Take 650 mg by mouth every 6 (six) hours as needed for mild pain.     Marland Kitchen albuterol (PROVENTIL HFA;VENTOLIN HFA) 108 (90 Base) MCG/ACT inhaler Inhale 2 puffs into the lungs every 4 (four) hours as needed for wheezing or shortness of breath. 1 Inhaler 5  . apixaban (ELIQUIS) 5 MG TABS tablet Take 1 tablet (5 mg total) by mouth 2 (two) times daily. 60 tablet 11  . budesonide-formoterol (SYMBICORT) 160-4.5 MCG/ACT inhaler Inhale 2 puffs into the lungs 2 (two) times daily. 10.2 g 5  . diltiazem (CARDIZEM CD) 240 MG 24 hr capsule Take 1 capsule (240 mg total) by mouth daily. 90 capsule 3  . hydrochlorothiazide (MICROZIDE) 12.5 MG capsule Take 2 capsules (25 mg total) by mouth daily. 180 capsule 3  . ipratropium-albuterol (  DUONEB) 0.5-2.5 (3) MG/3ML SOLN Take 3 mLs by nebulization every 6 (six) hours as needed. 360 mL 0  . methocarbamol (ROBAXIN) 500 MG tablet Take 1 tablet (500 mg total) by mouth every 8 (eight) hours as needed for muscle spasms. 30 tablet 1  . potassium chloride SA (K-DUR,KLOR-CON) 20 MEQ tablet Take 1 tablet (20 mEq total) by mouth daily. 90 tablet 3  . predniSONE (DELTASONE) 10 MG tablet 4 tabs for 2 days, then 3 tabs for 2 days, 2 tabs for 2 days, then 1 tab for 2 days, then stop 20 tablet 0  . Tiotropium Bromide Monohydrate (SPIRIVA RESPIMAT) 2.5 MCG/ACT AERS Inhale 2 puffs into the lungs daily. 1 Inhaler 3  . azithromycin (ZITHROMAX) 250 MG  tablet 500mg  (two tablets) today, then 250mg  (1 tablet) for the next 4 days 6 tablet 0  . HYDROcodone-acetaminophen (NORCO/VICODIN) 5-325 MG tablet Take 1 tablet by mouth every 6 (six) hours as needed for severe pain. 30 tablet 0   No facility-administered medications prior to visit.     Allergies: No Known Allergies  Social History   Socioeconomic History  . Marital status: Married    Spouse name: Not on file  . Number of children: 1  . Years of education: Not on file  . Highest education level: Not on file  Occupational History  . Not on file  Social Needs  . Financial resource strain: Not on file  . Food insecurity:    Worry: Not on file    Inability: Not on file  . Transportation needs:    Medical: Not on file    Non-medical: Not on file  Tobacco Use  . Smoking status: Former Smoker    Packs/day: 0.75    Years: 15.00    Pack years: 11.25    Types: Cigarettes    Last attempt to quit: 03/20/2014    Years since quitting: 3.9  . Smokeless tobacco: Former Systems developer    Quit date: 03/11/2014  Substance and Sexual Activity  . Alcohol use: No    Alcohol/week: 0.0 standard drinks  . Drug use: No  . Sexual activity: Not on file  Lifestyle  . Physical activity:    Days per week: Not on file    Minutes per session: Not on file  . Stress: Not on file  Relationships  . Social connections:    Talks on phone: Not on file    Gets together: Not on file    Attends religious service: Not on file    Active member of club or organization: Not on file    Attends meetings of clubs or organizations: Not on file    Relationship status: Not on file  . Intimate partner violence:    Fear of current or ex partner: Not on file    Emotionally abused: Not on file    Physically abused: Not on file    Forced sexual activity: Not on file  Other Topics Concern  . Not on file  Social History Narrative  . Not on file    Family History  Problem Relation Age of Onset  . Diabetes Mother   .  Emphysema Father   . Asthma Father   . Heart disease Father      Review of Systems:  See HPI for pertinent ROS. All other ROS negative.    Physical Exam: Blood pressure 132/78, pulse 70, temperature 97.7 F (36.5 C), temperature source Oral, resp. rate 16, height 5' 6.25" (1.683 m), weight 56.4  kg, SpO2 97 %., Body mass index is 19.93 kg/m. General: WF. Appears in no acute distress.  Neck: Supple. No thyromegaly. No lymphadenopathy. Lungs: Clear bilaterally to auscultation without wheezes, rales, or rhonchi. Breathing is unlabored. Heart: RRR with S1 S2. No murmurs, rubs, or gallops. Abdomen: Non-distended with normoactive bowel sounds. No hepatomegaly.  No obvious abdominal masses. She has some tenderness and some guarding in RUQ and epigastric region.  Musculoskeletal:  Strength and tone normal for age. Extremities/Skin: Warm and dry.  Neuro: Alert and oriented X 3. Moves all extremities spontaneously. Gait is normal. CNII-XII grossly in tact. Psych:  Responds to questions appropriately with a normal affect.     ASSESSMENT AND PLAN:  72 y.o. year old female with    1. Unintentional weight loss Was going to obtain CT scan stat but she just ate half a peanut butter sandwich prior to coming here.  I have told her to go home and only have clear liquid diet until she hears from me.  I am sending labs stat and we will follow-up these results this afternoon and then will follow up with her.  Have told her to also keep her cell phone with her with the ringer on high so that I can call her later this afternoon.  Also sent in Zofran for her to use to control nausea. She states that she has not had her gallbladder removed. Therefore cholecystitis is in the differential though I am concerned with possibility of cancer given a change in caliber of stool, change in stool consistency in stool habits, and sudden weight loss. --Will Obtain labs --Will Obtain CT abdomen pelvis ---If these are  nondiagnostic then will refer to GI for further evaluation. - CBC with Differential/Platelet - COMPLETE METABOLIC PANEL WITH GFR - Amylase - Lipase - TSH - ondansetron (ZOFRAN) 4 MG tablet; Take 1 tablet (4 mg total) by mouth every 8 (eight) hours as needed for nausea or vomiting.  Dispense: 20 tablet; Refill: 0 - CT ABDOMEN PELVIS W CONTRAST; Future  2. Nausea without vomiting - CBC with Differential/Platelet - COMPLETE METABOLIC PANEL WITH GFR - Amylase - Lipase - ondansetron (ZOFRAN) 4 MG tablet; Take 1 tablet (4 mg total) by mouth every 8 (eight) hours as needed for nausea or vomiting.  Dispense: 20 tablet; Refill: 0 - CT ABDOMEN PELVIS W CONTRAST; Future  3. Abdominal pain, RUQ - CBC with Differential/Platelet - COMPLETE METABOLIC PANEL WITH GFR - CT ABDOMEN PELVIS W CONTRAST; Future  4. Abdominal pain, epigastric - CBC with Differential/Platelet - COMPLETE METABOLIC PANEL WITH GFR - Amylase - Lipase - CT ABDOMEN PELVIS W CONTRAST; Future  5. Change in stool caliber - CT ABDOMEN PELVIS W CONTRAST; Future  6. Change in stool habits - CBC with Differential/Platelet - CT ABDOMEN PELVIS W CONTRAST; Future  7. Change in consistency of stool - CT ABDOMEN PELVIS W CONTRAST; Future   Signed, Olean Ree Osakis, [place], [facility] 03/01/2018 12:36 PM

## 2018-03-02 ENCOUNTER — Ambulatory Visit (HOSPITAL_COMMUNITY): Admission: RE | Admit: 2018-03-02 | Payer: Medicare Other | Source: Ambulatory Visit

## 2018-03-02 ENCOUNTER — Other Ambulatory Visit: Payer: Self-pay | Admitting: Physician Assistant

## 2018-03-02 ENCOUNTER — Ambulatory Visit (HOSPITAL_COMMUNITY): Payer: Medicare Other

## 2018-03-02 DIAGNOSIS — R634 Abnormal weight loss: Secondary | ICD-10-CM

## 2018-03-02 DIAGNOSIS — R1011 Right upper quadrant pain: Secondary | ICD-10-CM

## 2018-03-02 DIAGNOSIS — R1013 Epigastric pain: Secondary | ICD-10-CM

## 2018-03-02 DIAGNOSIS — R194 Change in bowel habit: Secondary | ICD-10-CM

## 2018-03-02 DIAGNOSIS — R195 Other fecal abnormalities: Secondary | ICD-10-CM

## 2018-03-07 ENCOUNTER — Other Ambulatory Visit: Payer: Self-pay

## 2018-03-07 DIAGNOSIS — R1011 Right upper quadrant pain: Secondary | ICD-10-CM

## 2018-03-07 DIAGNOSIS — R1013 Epigastric pain: Secondary | ICD-10-CM

## 2018-03-07 DIAGNOSIS — R634 Abnormal weight loss: Secondary | ICD-10-CM

## 2018-03-07 DIAGNOSIS — R194 Change in bowel habit: Secondary | ICD-10-CM

## 2018-03-10 ENCOUNTER — Ambulatory Visit (HOSPITAL_COMMUNITY)
Admission: RE | Admit: 2018-03-10 | Discharge: 2018-03-10 | Disposition: A | Discharge status: Home / Self Care | Payer: Medicare Other | Source: Ambulatory Visit | Attending: Physician Assistant | Admitting: Physician Assistant

## 2018-03-10 DIAGNOSIS — R1011 Right upper quadrant pain: Secondary | ICD-10-CM | POA: Diagnosis present

## 2018-03-10 DIAGNOSIS — R194 Change in bowel habit: Secondary | ICD-10-CM

## 2018-03-10 DIAGNOSIS — M4854XA Collapsed vertebra, not elsewhere classified, thoracic region, initial encounter for fracture: Secondary | ICD-10-CM | POA: Insufficient documentation

## 2018-03-10 DIAGNOSIS — I714 Abdominal aortic aneurysm, without rupture: Secondary | ICD-10-CM | POA: Insufficient documentation

## 2018-03-10 DIAGNOSIS — R634 Abnormal weight loss: Secondary | ICD-10-CM | POA: Diagnosis not present

## 2018-03-10 DIAGNOSIS — R1013 Epigastric pain: Secondary | ICD-10-CM | POA: Diagnosis present

## 2018-03-10 DIAGNOSIS — R195 Other fecal abnormalities: Secondary | ICD-10-CM | POA: Insufficient documentation

## 2018-03-10 DIAGNOSIS — I700 Atherosclerosis of aorta: Secondary | ICD-10-CM | POA: Insufficient documentation

## 2018-03-10 DIAGNOSIS — M4856XA Collapsed vertebra, not elsewhere classified, lumbar region, initial encounter for fracture: Secondary | ICD-10-CM | POA: Diagnosis not present

## 2018-03-10 DIAGNOSIS — K8020 Calculus of gallbladder without cholecystitis without obstruction: Secondary | ICD-10-CM | POA: Diagnosis not present

## 2018-03-10 MED ORDER — IOHEXOL 300 MG/ML  SOLN
100.0000 mL | Freq: Once | INTRAMUSCULAR | Status: AC | PRN
  Administered 2018-03-10: 100 mL via INTRAVENOUS

## 2018-04-13 ENCOUNTER — Telehealth: Payer: Self-pay | Admitting: Pulmonary Disease

## 2018-04-13 DIAGNOSIS — J441 Chronic obstructive pulmonary disease with (acute) exacerbation: Secondary | ICD-10-CM

## 2018-04-13 MED ORDER — PREDNISONE 10 MG PO TABS: ORAL_TABLET | ORAL | 0 refills | Status: DC

## 2018-04-13 NOTE — Telephone Encounter (Signed)
Spoke with pt. She states that she is using her Symbicort and Spiriva as prescribed. She is not having any fever or chills. Pt does have some slight wheezing. She is using her Albuterol HFA once a day. She is short of breath all the time.

## 2018-04-13 NOTE — Telephone Encounter (Signed)
Okay to offer:   Prednisone 10mg  tablet  >>>4 tabs for 2 days, then 3 tabs for 2 days, 2 tabs for 2 days, then 1 tab for 2 days, then stop >>>take with food  >>>take in the morning    Please place order.   If not improving she needs OV.  Wyn Quaker FNP

## 2018-04-13 NOTE — Telephone Encounter (Signed)
Spoke with pt. States that she is not feeling well. Reports increased SOB, wheezing and coughing. Cough is non productive at this time. Denies chest tightness. I offered the pt an appointment but she declined due to having no one to stay with her husband. Symptoms started about 2-3 days ago. She would like to have something sent in. Pt requests that Aaron Edelman address her message since RB is not available.  Aaron Edelman - please advise. Thanks.

## 2018-04-13 NOTE — Telephone Encounter (Signed)
Sorry the patient is not feeling well.  Ensure the patient is started her Spiriva 2.5 Respimat inhaler Ensure the patient is adherent to her Symbicort 160 Does she have fevers or chills?   Wheezing? Is she using her albuterol rescue inhaler? How much? When is she short of breath?  Is this all the time?  Or is it only with exertion?  Her is when she is lying flat?  Based off of these answers I could consider future therapies or if the patient will need an office visit.  Wyn Quaker, FNP

## 2018-04-13 NOTE — Telephone Encounter (Signed)
Spoke with pt. She is aware of Brian's recommendation. Rx has been sent in. Nothing further was needed.

## 2018-04-18 ENCOUNTER — Encounter: Payer: Self-pay | Admitting: Family Medicine

## 2018-04-18 ENCOUNTER — Ambulatory Visit (INDEPENDENT_AMBULATORY_CARE_PROVIDER_SITE_OTHER): Payer: Medicare Other | Admitting: Family Medicine

## 2018-04-18 VITALS — BP 142/68 | HR 91 | Temp 97.9°F | Resp 18 | Ht 66.25 in | Wt 128.0 lb

## 2018-04-18 DIAGNOSIS — J441 Chronic obstructive pulmonary disease with (acute) exacerbation: Secondary | ICD-10-CM | POA: Diagnosis not present

## 2018-04-18 MED ORDER — PREDNISONE 20 MG PO TABS: ORAL_TABLET | ORAL | 0 refills | Status: DC

## 2018-04-18 MED ORDER — AZITHROMYCIN 250 MG PO TABS: ORAL_TABLET | ORAL | 0 refills | Status: DC

## 2018-04-18 NOTE — Progress Notes (Signed)
Subjective:    Patient ID: Diane Patterson, female    DOB: 1946-03-14, 72 y.o.   MRN: [id number]  HPI Patient symptoms began last week on Thursday.  Symptoms include increased sputum production, a change in the consistency of the sputum.  It is now thick and white.  An increase work of breathing, increasing shortness of breath.  She is becoming progressively more winded with minimal activity.  She denies any fevers.  She denies any chills.  She denies any hemoptysis.  She is currently on Symbicort.  She uses her rescue inhaler 2-3 times a day as needed.  She see some benefit from this when she does not.  Her pulmonologist called out a prednisone taper beginning at 40 mg.  Patient is currently on 30 mg however she states that she has not improved.  She reports increasing chest congestion. Past Medical History:  Diagnosis Date  . Asthma   . Chronic airflow obstruction (HCC)   . Hypertension   . Tobacco abuse   . Venous insufficiency    Past Surgical History:  Procedure Laterality Date  . cataract    . NECK SURGERY    . no colonoscopy     "afraid to "; [facility] reviewed  . VESICOVAGINAL FISTULA CLOSURE W/ TAH     Current Outpatient Medications on File Prior to Visit  Medication Sig Dispense Refill  . acetaminophen (TYLENOL) 325 MG tablet Take 650 mg by mouth every 6 (six) hours as needed for mild pain.     Marland Kitchen albuterol (PROVENTIL HFA;VENTOLIN HFA) 108 (90 Base) MCG/ACT inhaler Inhale 2 puffs into the lungs every 4 (four) hours as needed for wheezing or shortness of breath. 1 Inhaler 5  . apixaban (ELIQUIS) 5 MG TABS tablet Take 1 tablet (5 mg total) by mouth 2 (two) times daily. 60 tablet 11  . budesonide-formoterol (SYMBICORT) 160-4.5 MCG/ACT inhaler Inhale 2 puffs into the lungs 2 (two) times daily. 10.2 g 5  . diltiazem (CARDIZEM CD) 240 MG 24 hr capsule Take 1 capsule (240 mg total) by mouth daily. 90 capsule 3  . hydrochlorothiazide (MICROZIDE) 12.5 MG capsule Take 2 capsules (25 mg  total) by mouth daily. 180 capsule 3  . ipratropium-albuterol (DUONEB) 0.5-2.5 (3) MG/3ML SOLN Take 3 mLs by nebulization every 6 (six) hours as needed. 360 mL 0  . potassium chloride SA (K-DUR,KLOR-CON) 20 MEQ tablet Take 1 tablet (20 mEq total) by mouth daily. 90 tablet 3  . predniSONE (DELTASONE) 10 MG tablet 4 tabs for 2 days, then 3 tabs for 2 days, 2 tabs for 2 days, then 1 tab for 2 days, then stop 20 tablet 0   No current facility-administered medications on file prior to visit.    No Known Allergies Social History   Socioeconomic History  . Marital status: Married    Spouse name: Not on file  . Number of children: 1  . Years of education: Not on file  . Highest education level: Not on file  Occupational History  . Not on file  Social Needs  . Financial resource strain: Not on file  . Food insecurity:    Worry: Not on file    Inability: Not on file  . Transportation needs:    Medical: Not on file    Non-medical: Not on file  Tobacco Use  . Smoking status: Former Smoker    Packs/day: 0.75    Years: 15.00    Pack years: 11.25    Types: Cigarettes  Last attempt to quit: 03/20/2014    Years since quitting: 4.0  . Smokeless tobacco: Former Systems developer    Quit date: 03/11/2014  Substance and Sexual Activity  . Alcohol use: No    Alcohol/week: 0.0 standard drinks  . Drug use: No  . Sexual activity: Not on file  Lifestyle  . Physical activity:    Days per week: Not on file    Minutes per session: Not on file  . Stress: Not on file  Relationships  . Social connections:    Talks on phone: Not on file    Gets together: Not on file    Attends religious service: Not on file    Active member of club or organization: Not on file    Attends meetings of clubs or organizations: Not on file    Relationship status: Not on file  . Intimate partner violence:    Fear of current or ex partner: Not on file    Emotionally abused: Not on file    Physically abused: Not on file     Forced sexual activity: Not on file  Other Topics Concern  . Not on file  Social History Narrative  . Not on file      Review of Systems  All other systems reviewed and are negative.      Objective:   Physical Exam  Constitutional: She appears well-developed and well-nourished.  HENT:  Nose: Nose normal.  Mouth/Throat: Oropharynx is clear and moist. No oropharyngeal exudate.  Neck: No JVD present.  Cardiovascular: Normal rate, regular rhythm and normal heart sounds.  Pulmonary/Chest: Effort normal. No respiratory distress. She has decreased breath sounds. She has wheezes. She has rales.  Lymphadenopathy:    She has no cervical adenopathy.  Vitals reviewed.         Assessment & Plan:  COPD exacerbation ([place])  I believe the patient has a COPD exacerbation.  I will increase her prednisone to 60 mg a day for 2 days then 40 mg a day for 2 days then 20 mg a day for 2 days.  In addition I will start the patient on a Z-Pak to cover atypical bacteria given the increased sputum production and the change in sputum consistency.  Continue Symbicort but use albuterol every 4-6 hours as needed for wheezing or shortness of breath.  Recheck immediately if worsening.  I will also try to arrange to have the patient receive a portable oxygen concentrator.  She has a difficult time using the portable oxygen tanks as she is unable to push around the heavy metal canisters using the rolling cart given her frail state

## 2018-05-01 ENCOUNTER — Ambulatory Visit (INDEPENDENT_AMBULATORY_CARE_PROVIDER_SITE_OTHER): Payer: Medicare Other | Admitting: Cardiology

## 2018-05-01 ENCOUNTER — Telehealth: Payer: Self-pay | Admitting: Cardiology

## 2018-05-01 ENCOUNTER — Encounter: Payer: Self-pay | Admitting: Cardiology

## 2018-05-01 VITALS — BP 124/60 | HR 86 | Ht 66.25 in | Wt 125.4 lb

## 2018-05-01 DIAGNOSIS — I209 Angina pectoris, unspecified: Secondary | ICD-10-CM | POA: Diagnosis not present

## 2018-05-01 DIAGNOSIS — Z79899 Other long term (current) drug therapy: Secondary | ICD-10-CM | POA: Diagnosis not present

## 2018-05-01 DIAGNOSIS — R0602 Shortness of breath: Secondary | ICD-10-CM | POA: Diagnosis not present

## 2018-05-01 DIAGNOSIS — I480 Paroxysmal atrial fibrillation: Secondary | ICD-10-CM

## 2018-05-01 DIAGNOSIS — I10 Essential (primary) hypertension: Secondary | ICD-10-CM | POA: Diagnosis not present

## 2018-05-01 MED ORDER — HYDROCHLOROTHIAZIDE 12.5 MG PO CAPS: 25.0000 mg | ORAL_CAPSULE | Freq: Every day | ORAL | 11 refills | Status: DC

## 2018-05-01 MED ORDER — DILTIAZEM HCL ER COATED BEADS 240 MG PO CP24: 240.0000 mg | ORAL_CAPSULE | Freq: Every day | ORAL | 11 refills | Status: DC

## 2018-05-01 MED ORDER — APIXABAN 5 MG PO TABS: 5.0000 mg | ORAL_TABLET | Freq: Two times a day (BID) | ORAL | 11 refills | Status: DC

## 2018-05-01 NOTE — Telephone Encounter (Signed)
Follow up    Patient called back wanted to see Dr. Marlou Porch  did schedule for 04/28/2018 at 4:20pm   Pt c/o Shortness Of Breath: STAT if SOB developed within the last 24 hours or pt is noticeably SOB on the phone  1. Are you currently SOB (can you hear that pt is SOB on the phone)? No, patient did not seem short of breath on the phone  2. How long have you been experiencing SOB? She just said on and off more over the weekend  3. Are you SOB when sitting or when up moving around? Moving around  4. Are you currently experiencing any other symptoms? Only sob when moving around

## 2018-05-01 NOTE — Patient Instructions (Signed)
Medication Instructions:  The current medical regimen is effective;  continue present plan and medications.  If you need a refill on your cardiac medications before your next appointment, please call your pharmacy.   Lab work: Please have blood work (CBC/BMP)  If you have labs (blood work) drawn today and your tests are completely normal, you will receive your results only by: Marland Kitchen MyChart Message (if you have MyChart) OR . A paper copy in the mail If you have any lab test that is abnormal or we need to change your treatment, we will call you to review the results.  Follow-Up: At [facility], you and your health needs are our priority.  As part of our continuing mission to provide you with exceptional heart care, we have created designated Provider Care Teams.  These Care Teams include your primary Cardiologist (physician) and Advanced Practice Providers (APPs -  Physician Assistants and Nurse Practitioners) who all work together to provide you with the care you need, when you need it. You will need a follow up appointment in 6 months with Diane Kicks, NP and 1 year with Diane Patterson.  Please call our office 2 months in advance to schedule this appointment.  You may see Diane Furbish, MD or one of the following Advanced Practice Providers on your designated Care Team:   Diane Merle, NP Diane Kicks, NP . Diane Drown, NP  Thank you for choosing [facility]!!

## 2018-05-01 NOTE — Telephone Encounter (Signed)
Pt since in the office

## 2018-05-01 NOTE — Progress Notes (Signed)
Cardiology Office Note:    Date:  05/01/2018   ID:  Diane Patterson, DOB 1946/05/28, MRN [id number]  PCP:  Orlena Sheldon, PA-C  Cardiologist:  Candee Furbish, MD  Electrophysiologist:  None   Referring MD: Orlena Sheldon, PA-C     History of Present Illness:    Diane Patterson is a 72 y.o. female here for follow-up of paroxysmal atrial fibrillation, COPD, asthma, hypertension.  EKG from 06/17/15 shows sinus rhythm and left anterior fascicular block. Went to ER with SOB.   Thankfully, her nuclear stress test, event monitor reassuring. It is likely that her symptoms of shortness of breath or COPD related. No evidence of ischemia. When she coughs and brings up mucus, this does help sometimes with her shortness of breath. She did ask me why there is no cure for COPD at this point. We discussed at prior visit.  Had a episode of atrial fibrillation with rapid ventricular response on telemetry in February 2019.  Continuing with Eliquis.  Her husband had a bleed on Xarelto requiring 4 units of blood.  Would like to have every 70-month hemoglobin.  Seems very reasonable.  Overall she has been having some atypical muscular skeletal-like chest discomfort.  Once again reassured her from her stress test.   Past Medical History:  Diagnosis Date  . Asthma   . Chronic airflow obstruction (HCC)   . Hypertension   . Tobacco abuse   . Venous insufficiency     Past Surgical History:  Procedure Laterality Date  . cataract    . NECK SURGERY    . no colonoscopy     "afraid to "; [facility] reviewed  . VESICOVAGINAL FISTULA CLOSURE W/ TAH      Current Medications: Current Meds  Medication Sig  . acetaminophen (TYLENOL) 325 MG tablet Take 650 mg by mouth every 6 (six) hours as needed for mild pain.   Marland Kitchen albuterol (PROVENTIL HFA;VENTOLIN HFA) 108 (90 Base) MCG/ACT inhaler Inhale 2 puffs into the lungs every 4 (four) hours as needed for wheezing or shortness of breath.  Marland Kitchen apixaban (ELIQUIS) 5 MG  TABS tablet Take 1 tablet (5 mg total) by mouth 2 (two) times daily.  Marland Kitchen azithromycin (ZITHROMAX) 250 MG tablet 2 tabs poqday1, 1 tab poqday 2-5  . budesonide-formoterol (SYMBICORT) 160-4.5 MCG/ACT inhaler Inhale 2 puffs into the lungs 2 (two) times daily.  Marland Kitchen diltiazem (CARDIZEM CD) 240 MG 24 hr capsule Take 1 capsule (240 mg total) by mouth daily.  . hydrochlorothiazide (MICROZIDE) 12.5 MG capsule Take 2 capsules (25 mg total) by mouth daily.  . potassium chloride SA (K-DUR,KLOR-CON) 20 MEQ tablet Take 20 mEq by mouth daily.  . [DISCONTINUED] apixaban (ELIQUIS) 5 MG TABS tablet Take 1 tablet (5 mg total) by mouth 2 (two) times daily. (Patient taking differently: Take 5 mg by mouth daily. )  . [DISCONTINUED] diltiazem (CARDIZEM CD) 240 MG 24 hr capsule Take 1 capsule (240 mg total) by mouth daily.  . [DISCONTINUED] hydrochlorothiazide (MICROZIDE) 12.5 MG capsule Take 2 capsules (25 mg total) by mouth daily.  . [DISCONTINUED] potassium chloride SA (K-DUR,KLOR-CON) 20 MEQ tablet Take 1 tablet (20 mEq total) by mouth daily.     Allergies:   Patient has no known allergies.   Social History   Socioeconomic History  . Marital status: Married    Spouse name: Not on file  . Number of children: 1  . Years of education: Not on file  . Highest education level: Not on file  Occupational History  . Not on file  Social Needs  . Financial resource strain: Not on file  . Food insecurity:    Worry: Not on file    Inability: Not on file  . Transportation needs:    Medical: Not on file    Non-medical: Not on file  Tobacco Use  . Smoking status: Former Smoker    Packs/day: 0.75    Years: 15.00    Pack years: 11.25    Types: Cigarettes    Last attempt to quit: 03/20/2014    Years since quitting: 4.1  . Smokeless tobacco: Former Systems developer    Quit date: 03/11/2014  Substance and Sexual Activity  . Alcohol use: No    Alcohol/week: 0.0 standard drinks  . Drug use: No  . Sexual activity: Not on file    Lifestyle  . Physical activity:    Days per week: Not on file    Minutes per session: Not on file  . Stress: Not on file  Relationships  . Social connections:    Talks on phone: Not on file    Gets together: Not on file    Attends religious service: Not on file    Active member of club or organization: Not on file    Attends meetings of clubs or organizations: Not on file    Relationship status: Not on file  Other Topics Concern  . Not on file  Social History Narrative  . Not on file     Family History: The patient's family history includes Asthma in her father; Diabetes in her mother; Emphysema in her father; Heart disease in her father.  ROS:   Please see the history of present illness.     All other systems reviewed and are negative.  EKGs/Labs/Other Studies Reviewed:    The following studies were reviewed today:  NUC stress: 08/12/15: 1. This study was count-poor at rest and with Lexiscan stress. There is a fixed, small, mild apical perfusion defect with normal wall motion that is likely artifact. No definite ischemia or infarction.  2. Normal EF and wall motion.  3. Low risk study.   Reassuring  Event monitor 08/12/15: No adverse rhythms.   EKG:  EKG is not ordered today.   07/21/16 shows normal sinus rhythm 74, left axis deviation, no other significant abnormality. Personally measured QT is 360 ms..    Recent Labs: 07/11/2017: Magnesium 1.9 03/01/2018: ALT 11; BUN 25; Creat 1.05; Hemoglobin 11.9; Platelets 346; Potassium 3.5; Sodium 141; TSH 1.26  Recent Lipid Panel    Component Value Date/Time   CHOL 154 08/18/2017 0849   TRIG 135 08/18/2017 0849   HDL 58 08/18/2017 0849   CHOLHDL 2.7 08/18/2017 0849   VLDL 27.6 02/10/2010 1017   LDLCALC 74 08/18/2017 0849   LDLDIRECT 136.0 02/10/2010 1017    Physical Exam:    VS:  BP 124/60   Pulse 86   Ht 5' 6.25" (1.683 m)   Wt 125 lb 6.4 oz (56.9 kg)   LMP  (LMP Unknown)   SpO2 95%   BMI 20.09 kg/m      Wt Readings from Last 3 Encounters:  05/01/18 125 lb 6.4 oz (56.9 kg)  04/18/18 128 lb (58.1 kg)  03/01/18 124 lb 6.4 oz (56.4 kg)     GEN:  Well nourished, well developed in no acute distress HEENT: Normal NECK: No JVD; No carotid bruits LYMPHATICS: No lymphadenopathy CARDIAC: RRR, no murmurs, rubs, gallops RESPIRATORY:  Clear to auscultation without  rales, wheezing or rhonchi  ABDOMEN: Soft, non-tender, non-distended MUSCULOSKELETAL:  No edema; No deformity  SKIN: Warm and dry NEUROLOGIC:  Alert and oriented x 3 PSYCHIATRIC:  Normal affect   ASSESSMENT:    1. Angina pectoris ([place])   2. Essential hypertension   3. Shortness of breath   4. Medication management   5. PAF (paroxysmal atrial fibrillation) (HCC)    PLAN:    In order of problems listed above:  Paroxysmal atrial fibrillation - Episode in February 2019.  No changes made.  Continue with Eliquis.  Angina -Nuclear stress test 2017 reassuring.  Has family history of MI with her brother having a heart attack during a treadmill stress test. O2 helps.  She seems quite worried about this.  Near syncope - Transient hypotension likely.  Event monitor reassuring.  COPD -Dr. Lamonte Sakai has been monitoring.  Essential hypertension -Currently well controlled  LAFB -Continue to monitor.  No syncope.  Her husband came with her.  His right leg was operated on because of bleed after fall.  6 months with Mickel Baas, 12 with me  Medication Adjustments/Labs and Tests Ordered: Current medicines are reviewed at length with the patient today.  Concerns regarding medicines are outlined above.  Orders Placed This Encounter  Procedures  . CBC  . Basic metabolic panel   Meds ordered this encounter  Medications  . diltiazem (CARDIZEM CD) 240 MG 24 hr capsule    Sig: Take 1 capsule (240 mg total) by mouth daily.    Dispense:  30 capsule    Refill:  11  . apixaban (ELIQUIS) 5 MG TABS tablet    Sig: Take 1 tablet (5 mg total) by  mouth 2 (two) times daily.    Dispense:  60 tablet    Refill:  11  . hydrochlorothiazide (MICROZIDE) 12.5 MG capsule    Sig: Take 2 capsules (25 mg total) by mouth daily.    Dispense:  60 capsule    Refill:  11    Patient Instructions  Medication Instructions:  The current medical regimen is effective;  continue present plan and medications.  If you need a refill on your cardiac medications before your next appointment, please call your pharmacy.   Lab work: Please have blood work (CBC/BMP)  If you have labs (blood work) drawn today and your tests are completely normal, you will receive your results only by: Marland Kitchen MyChart Message (if you have MyChart) OR . A paper copy in the mail If you have any lab test that is abnormal or we need to change your treatment, we will call you to review the results.  Follow-Up: At [facility], you and your health needs are our priority.  As part of our continuing mission to provide you with exceptional heart care, we have created designated Provider Care Teams.  These Care Teams include your primary Cardiologist (physician) and Advanced Practice Providers (APPs -  Physician Assistants and Nurse Practitioners) who all work together to provide you with the care you need, when you need it. You will need a follow up appointment in 6 months with Cecilie Kicks, NP and 1 year with Dr Marlou Porch.  Please call our office 2 months in advance to schedule this appointment.  You may see Candee Furbish, MD or one of the following Advanced Practice Providers on your designated Care Team:   Truitt Merle, NP Cecilie Kicks, NP . Kathyrn Drown, NP  Thank you for choosing [facility]!!  Signed, Candee Furbish, MD  05/01/2018 5:15 PM    [facility]

## 2018-05-01 NOTE — Telephone Encounter (Signed)
New Message:       Pt c/o Shortness Of Breath: STAT if SOB developed within the last 24 hours or pt is noticeably SOB on the phone  1. Are you currently SOB (can you hear that pt is SOB on the phone)? No  2. How long have you been experiencing SOB?   3. Are you SOB when sitting or when up moving around? Moving around  4. Are you currently experiencing any other symptoms? Pt states she is having some SOB but pt states she only wants to see skains

## 2018-05-02 ENCOUNTER — Other Ambulatory Visit: Payer: Medicare Other

## 2018-05-02 DIAGNOSIS — I10 Essential (primary) hypertension: Secondary | ICD-10-CM

## 2018-05-02 DIAGNOSIS — Z79899 Other long term (current) drug therapy: Secondary | ICD-10-CM

## 2018-05-02 DIAGNOSIS — R0602 Shortness of breath: Secondary | ICD-10-CM

## 2018-05-03 LAB — BASIC METABOLIC PANEL
BUN / CREAT RATIO: 23 (ref 12–28)
BUN: 22 mg/dL (ref 8–27)
CALCIUM: 9.3 mg/dL (ref 8.7–10.3)
CO2: 25 mmol/L (ref 20–29)
CREATININE: 0.97 mg/dL (ref 0.57–1.00)
Chloride: 99 mmol/L (ref 96–106)
GFR calc Af Amer: 67 mL/min/{1.73_m2} (ref >59)
GFR calc non Af Amer: 58 mL/min/{1.73_m2} — ABNORMAL LOW (ref >59)
Glucose: 80 mg/dL (ref 65–99)
Potassium: 3.6 mmol/L (ref 3.5–5.2)
Sodium: 140 mmol/L (ref 134–144)

## 2018-05-03 LAB — CBC
HEMATOCRIT: 33.9 % — AB (ref 34.0–46.6)
HEMOGLOBIN: 11.1 g/dL (ref 11.1–15.9)
MCH: 29.7 pg (ref 26.6–33.0)
MCHC: 32.7 g/dL (ref 31.5–35.7)
MCV: 91 fL (ref 79–97)
Platelets: 294 10*3/uL (ref 150–450)
RBC: 3.74 x10E6/uL — ABNORMAL LOW (ref 3.77–5.28)
RDW: 13 % (ref 12.3–15.4)
WBC: 11.7 10*3/uL — AB (ref 3.4–10.8)

## 2018-05-08 ENCOUNTER — Telehealth: Payer: Self-pay | Admitting: Cardiology

## 2018-05-08 NOTE — Telephone Encounter (Signed)
Patient returned call for lab results.  She stated will be out, but you can leave results on voicemail.

## 2018-05-08 NOTE — Telephone Encounter (Signed)
Left message of results on VM as instructed by pt.  Requested she call back if any questions or concerns.

## 2018-05-22 ENCOUNTER — Ambulatory Visit: Payer: Medicare Other | Admitting: Nurse Practitioner

## 2018-05-25 ENCOUNTER — Ambulatory Visit: Payer: Medicare Other | Admitting: Cardiology

## 2018-05-26 ENCOUNTER — Encounter: Payer: Self-pay | Admitting: Family Medicine

## 2018-05-26 ENCOUNTER — Ambulatory Visit (INDEPENDENT_AMBULATORY_CARE_PROVIDER_SITE_OTHER): Payer: Medicare Other | Admitting: Family Medicine

## 2018-05-26 VITALS — BP 110/68 | HR 64 | Temp 97.6°F | Resp 15 | Ht 66.25 in | Wt 127.0 lb

## 2018-05-26 DIAGNOSIS — J9611 Chronic respiratory failure with hypoxia: Secondary | ICD-10-CM | POA: Diagnosis not present

## 2018-05-26 DIAGNOSIS — J441 Chronic obstructive pulmonary disease with (acute) exacerbation: Secondary | ICD-10-CM

## 2018-05-26 DIAGNOSIS — E119 Type 2 diabetes mellitus without complications: Secondary | ICD-10-CM | POA: Diagnosis not present

## 2018-05-26 DIAGNOSIS — J0140 Acute pansinusitis, unspecified: Secondary | ICD-10-CM

## 2018-05-26 DIAGNOSIS — I10 Essential (primary) hypertension: Secondary | ICD-10-CM

## 2018-05-26 DIAGNOSIS — E785 Hyperlipidemia, unspecified: Secondary | ICD-10-CM

## 2018-05-26 DIAGNOSIS — Z7901 Long term (current) use of anticoagulants: Secondary | ICD-10-CM

## 2018-05-26 DIAGNOSIS — I700 Atherosclerosis of aorta: Secondary | ICD-10-CM

## 2018-05-26 DIAGNOSIS — Z1159 Encounter for screening for other viral diseases: Secondary | ICD-10-CM

## 2018-05-26 MED ORDER — ALBUTEROL SULFATE (2.5 MG/3ML) 0.083% IN NEBU: 2.5000 mg | INHALATION_SOLUTION | Freq: Four times a day (QID) | RESPIRATORY_TRACT | 1 refills | Status: DC | PRN

## 2018-05-26 MED ORDER — BENZONATATE 100 MG PO CAPS: 100.0000 mg | ORAL_CAPSULE | Freq: Three times a day (TID) | ORAL | 0 refills | Status: DC | PRN

## 2018-05-26 MED ORDER — ALBUTEROL SULFATE HFA 108 (90 BASE) MCG/ACT IN AERS: 2.0000 | INHALATION_SPRAY | RESPIRATORY_TRACT | 5 refills | Status: DC | PRN

## 2018-05-26 MED ORDER — DOXYCYCLINE HYCLATE 100 MG PO TABS: 100.0000 mg | ORAL_TABLET | Freq: Two times a day (BID) | ORAL | 0 refills | Status: AC

## 2018-05-26 MED ORDER — PREDNISONE 20 MG PO TABS: ORAL_TABLET | ORAL | 0 refills | Status: DC

## 2018-05-26 NOTE — Patient Instructions (Signed)
Start the steroids and antibiotics today.  Use rescue inhaler frequently as needed for wheeze, chest tightness and shortness of breath worse than your baseline.  I put in albuterol vials to try in the nebulizer to see if you tolerate it.  They may be helpful

## 2018-05-26 NOTE — Progress Notes (Signed)
Patient ID: Diane Patterson, female    DOB: 1946/01/12, 72 y.o.   MRN: [id number]  PCP: Orlena Sheldon, PA-C  Chief Complaint  Patient presents with  . Cough    Patient has c/o chest congestion, and cough. Onset Monday.  Has hx of COPD    Subjective:   Diane Patterson is a 72 y.o. female, presents to clinic with CC of worsening cough with productive sputum and worse SOB and wheeze than normal.  Hx of COPD and chronic respiratory failure, on 2L O2 via [place].  Her husband was recently taken and admitted to the hospital.  She endorses difficulty walking around the hallways and she is her husbands primary caretaker.  She reports sinus pain and pressure, with pain behind eyes, associated with drainage and post nasal drip, she has more productive sputum than at her baseline.  She is fatigued and feels mildly ill.  She has not had to increase her O2 but she is not doing as much activity as she usually does.  She has been using rescue inhaler frequently and it helps for a short while.  She says nebs make her feel worse, but she cannot say what meds she has used in nebulizer machine.  She has been compliant with her maintenance inhaler.  Patient also states she is more constipated than usual and still having trouble despite using MiraLAX daily  She is compliant with her oral anticoagulant and she would like her blood levels checked to make sure she is not anemic  She does have a history of A. fib and is on Cardizem she denies any palpitations, orthopnea, PND, lower extremity edema, syncope or near syncope, chest pain.  She has not eaten yet today.  She states that she does not have diabetes although her hemoglobin A1c was elevated earlier this year in March and she was going to come for repeated labs after working on her diet.  I have explained to her that she does have diabetes and we can recheck her labs today and see where her blood sugars are at.  She denies any unintentional weight loss,  polyuria polydipsia.     Patient Active Problem List   Diagnosis Date Noted  . Diabetes mellitus type 2, uncomplicated ([place]) 90/30/0923  . Atrial fibrillation with RVR ([place])   . AKI (acute kidney injury) ([place]) 07/11/2017  . Chronic respiratory failure with hypoxia ([place]) 11/15/2016  . Allergic rhinitis 10/14/2016  . Contusion 12/01/2015  . Abscess of umbilicus 30/12/6224  . COPD exacerbation ([place]) 06/23/2015  . Right carotid bruit 08/19/2014  . Hyperlipidemia 08/19/2014  . Prediabetes 08/19/2014  . Bruising 09/26/2013  . History of basal cell cancer 09/26/2013  . Oral candidiasis 01/10/2013  . Essential hypertension 12/21/2011  . COPD (chronic obstructive pulmonary disease) ([place]) 01/20/2011  . Constipation 06/24/2010  . ABDOMINAL BLOATING 06/24/2010  . ATHEROSCLEROSIS OF AORTA 02/10/2010  . FATTY LIVER DISEASE 02/10/2010  . Anxiety state 05/14/2008  . Former smoker 12/28/2006  . VENOUS INSUFFICIENCY 12/28/2006     Prior to Admission medications   Medication Sig Start Date End Date Taking? Authorizing Provider  acetaminophen (TYLENOL) 325 MG tablet Take 650 mg by mouth every 6 (six) hours as needed for mild pain.    Yes [provider]  albuterol (PROVENTIL HFA;VENTOLIN HFA) 108 (90 Base) MCG/ACT inhaler Inhale 2 puffs into the lungs every 4 (four) hours as needed for wheezing or shortness of breath. 02/20/18  Yes Lauraine Rinne, NP  apixaban (ELIQUIS) 5 MG TABS tablet Take 1 tablet (5 mg total) by mouth 2 (two) times daily. 05/01/18  Yes Jerline Pain, MD  azithromycin (ZITHROMAX) 250 MG tablet 2 tabs poqday1, 1 tab poqday 2-5 04/18/18  Yes Pickard, Cammie Mcgee, MD  budesonide-formoterol [facility]) 160-4.5 MCG/ACT inhaler Inhale 2 puffs into the lungs 2 (two) times daily. 02/20/18  Yes Lauraine Rinne, NP  diltiazem (CARDIZEM CD) 240 MG 24 hr capsule Take 1 capsule (240 mg total) by mouth daily. 05/01/18  Yes Jerline Pain, MD  hydrochlorothiazide (MICROZIDE) 12.5 MG capsule  Take 2 capsules (25 mg total) by mouth daily. 05/01/18  Yes Jerline Pain, MD  potassium chloride SA (K-DUR,KLOR-CON) 20 MEQ tablet Take 20 mEq by mouth daily.   Yes [provider]     No Known Allergies   Family History  Problem Relation Age of Onset  . Diabetes Mother   . Emphysema Father   . Asthma Father   . Heart disease Father      Social History   Socioeconomic History  . Marital status: Married    Spouse name: Not on file  . Number of children: 1  . Years of education: Not on file  . Highest education level: Not on file  Occupational History  . Not on file  Social Needs  . Financial resource strain: Not on file  . Food insecurity:    Worry: Not on file    Inability: Not on file  . Transportation needs:    Medical: Not on file    Non-medical: Not on file  Tobacco Use  . Smoking status: Former Smoker    Packs/day: 0.75    Years: 15.00    Pack years: 11.25    Types: Cigarettes    Last attempt to quit: 03/20/2014    Years since quitting: 4.1  . Smokeless tobacco: Former Systems developer    Quit date: 03/11/2014  Substance and Sexual Activity  . Alcohol use: No    Alcohol/week: 0.0 standard drinks  . Drug use: No  . Sexual activity: Not on file  Lifestyle  . Physical activity:    Days per week: Not on file    Minutes per session: Not on file  . Stress: Not on file  Relationships  . Social connections:    Talks on phone: Not on file    Gets together: Not on file    Attends religious service: Not on file    Active member of club or organization: Not on file    Attends meetings of clubs or organizations: Not on file    Relationship status: Not on file  . Intimate partner violence:    Fear of current or ex partner: Not on file    Emotionally abused: Not on file    Physically abused: Not on file    Forced sexual activity: Not on file  Other Topics Concern  . Not on file  Social History Narrative  . Not on file     Review of Systems    Constitutional: Positive for activity change and fatigue. Negative for appetite change, chills, diaphoresis, fever and unexpected weight change.  HENT: Positive for congestion, postnasal drip, rhinorrhea, sinus pressure and sinus pain. Negative for sneezing, sore throat and voice change.   Eyes: Negative.   Respiratory: Positive for cough, chest tightness, shortness of breath and wheezing. Negative for apnea, choking and stridor.   Cardiovascular: Negative for chest pain, palpitations and leg swelling.  Gastrointestinal: Positive  for constipation. Negative for abdominal distention, abdominal pain, blood in stool, diarrhea, nausea and vomiting.  Endocrine: Negative.   Genitourinary: Negative.   Musculoskeletal: Negative.   Skin: Negative.  Negative for color change, pallor and rash.  Allergic/Immunologic: Negative.   Neurological: Positive for weakness. Negative for dizziness, tremors, seizures, syncope, speech difficulty, light-headedness and numbness.  Hematological: Negative.   Psychiatric/Behavioral: Negative.   All other systems reviewed and are negative.      Objective:    Vitals:   05/26/18 0906  BP: 110/68  Pulse: 64  Resp: 15  Temp: 97.6 F (36.4 C)  TempSrc: Oral  SpO2: 100%  Weight: 127 lb (57.6 kg)  Height: 5' 6.25" (1.683 m)      Physical Exam Vitals signs and nursing note reviewed.  Constitutional:      General: She is not in acute distress.    Appearance: She is well-developed. She is not toxic-appearing or diaphoretic.     Comments: Chronically ill-appearing elderly female, appears stated age, slightly frail-appearing, on 2 L oxygen via nasal cannula  HENT:     Head: Normocephalic and atraumatic.     Right Ear: Hearing, tympanic membrane, ear canal and external ear normal.     Left Ear: Hearing, tympanic membrane, ear canal and external ear normal.     Nose: Mucosal edema, congestion and rhinorrhea present.     Right Sinus: No maxillary sinus tenderness  or frontal sinus tenderness.     Left Sinus: No maxillary sinus tenderness or frontal sinus tenderness.     Mouth/Throat:     Mouth: Mucous membranes are moist. Mucous membranes are not pale.     Pharynx: Uvula midline. Posterior oropharyngeal erythema (mild) present. No oropharyngeal exudate or uvula swelling.     Tonsils: No tonsillar abscesses.  Eyes:     General: No scleral icterus.       Right eye: No discharge.        Left eye: No discharge.     Conjunctiva/sclera: Conjunctivae normal.     Pupils: Pupils are equal, round, and reactive to light.  Neck:     Musculoskeletal: Normal range of motion and neck supple.     Trachea: No tracheal deviation.  Cardiovascular:     Rate and Rhythm: Regular rhythm. Tachycardia present.     Pulses: Normal pulses.          Radial pulses are 2+ on the right side and 2+ on the left side.     Heart sounds: No murmur. No friction rub. No gallop.   Pulmonary:     Effort: Pulmonary effort is normal. No tachypnea, accessory muscle usage, respiratory distress or retractions.     Breath sounds: No stridor. No wheezing, rhonchi or rales.     Comments: Diminished breath sounds throughout all lung fields, increased AP diameter Abdominal:     General: Bowel sounds are normal. There is no distension.     Palpations: Abdomen is soft. There is no mass.     Tenderness: There is no abdominal tenderness. There is no guarding.     Hernia: No hernia is present.  Musculoskeletal: Normal range of motion.     Right lower leg: No edema.     Left lower leg: No edema.  Skin:    General: Skin is warm and dry.     Coloration: Skin is not pale.     Findings: No rash.     Comments: Scattered bruising to hands and forearms bilaterally  Neurological:  Mental Status: She is alert.     Motor: No abnormal muscle tone.     Coordination: Coordination normal.  Psychiatric:        Mood and Affect: Mood normal.        Behavior: Behavior normal.           Assessment  & Plan:    72 year old female with history of COPD respiratory failure A. fib, hypertension, hyperlipidemia and diabetes which is uncontrolled and not currently treated, presents with COPD exacerbation symptoms x5 days.  She appears to have some sinusitis, and history given is consistent with COPD exacerbation -breath sounds are difficult to hear throughout all lung fields, consistent with history of emphysema, no wheeze or respiratory distress on exam.  She is on 2 L of oxygen has not had to increase this.  Will treat with doxycycline to cover sinusitis and chest, steroids, encouraged to take Mucinex, refilled inhalers and given albuterol nebulizer vials to try, may be helpful at night and in the morning.  She was diagnosed with diabetes in March of this year but has not returned to follow-up.  She is fasting this morning so we will obtain labs to recheck A1c, assess renal function, liver function, cholesterol.  She is on oral anticoagulant although she reports no melena or hematochezia she would like her labs checked make sure she is not having any anemia or occult blood loss.    She also complains of worsening constipation despite using MiraLAX she was given a sample of low-dose Linzess.  Caution with using - asked to use only 2-3 days in a row max and follow up if no BM or f/up if she feels weak if she loses too much fluids, would like to be very conservative with her because she appears very frail.  Blood pressure appears at goal.  Heart rate is rapid borderline tachycardia but does feel regular today, no signs of fluid overload, do not suspect A. fib with RVR, she is able to walk around the clinic without any difficulty or near syncope, VS appear stable and WNL.    She is on low-dose HCTZ and potassium - recheck electrolytes and renal function.    ICD-10-CM   1. COPD exacerbation (HCC) K48.1 COMPLETE METABOLIC PANEL WITH GFR    CBC with Differential/Platelet    albuterol (PROVENTIL HFA;VENTOLIN  HFA) 108 (90 Base) MCG/ACT inhaler    albuterol (PROVENTIL) (2.5 MG/3ML) 0.083% nebulizer solution    benzonatate (TESSALON) 100 MG capsule    predniSONE (DELTASONE) 20 MG tablet  2. Chronic respiratory failure with hypoxia (HCC) E56.31 COMPLETE METABOLIC PANEL WITH GFR    CBC with Differential/Platelet  3. Type 2 diabetes mellitus without complication, without long-term current use of insulin (HCC) E11.9 Hemoglobin A1c    COMPLETE METABOLIC PANEL WITH GFR  4. Essential hypertension S97 COMPLETE METABOLIC PANEL WITH GFR  5. Hyperlipidemia, unspecified hyperlipidemia type W26.3 COMPLETE METABOLIC PANEL WITH GFR    Lipid Panel  6. ATHEROSCLEROSIS OF AORTA I70.0 Hemoglobin A1c    CBC with Differential/Platelet    Lipid Panel  7. On continuous oral anticoagulation Z79.01 CBC with Differential/Platelet  8. Need for hepatitis C screening test Z11.59 Hepatitis C antibody  9. Acute pansinusitis, recurrence not specified J01.40 doxycycline (VIBRA-TABS) 100 MG tablet    Regarding antibiotics I did pick doxycycline to cover both sinuses and lungs, she was also recently on a Z-Pak so we will try doxycycline and if she does not improve she was encouraged to follow-up  Delsa Grana, PA-C 05/26/18 9:16 AM

## 2018-05-27 LAB — CBC WITH DIFFERENTIAL/PLATELET
Basophils Absolute: 63 cells/uL (ref 0–200)
Basophils Relative: 0.6 %
Eosinophils Absolute: 483 cells/uL (ref 15–500)
Eosinophils Relative: 4.6 %
HCT: 34.9 % — ABNORMAL LOW (ref 35.0–45.0)
Hemoglobin: 11.4 g/dL — ABNORMAL LOW (ref 11.7–15.5)
Lymphs Abs: 2436 cells/uL (ref 850–3900)
MCH: 29.2 pg (ref 27.0–33.0)
MCHC: 32.7 g/dL (ref 32.0–36.0)
MCV: 89.3 fL (ref 80.0–100.0)
MPV: 11 fL (ref 7.5–12.5)
Monocytes Relative: 8.7 %
NEUTROS PCT: 62.9 %
Neutro Abs: 6605 cells/uL (ref 1500–7800)
Platelets: 341 10*3/uL (ref 140–400)
RBC: 3.91 10*6/uL (ref 3.80–5.10)
RDW: 12.7 % (ref 11.0–15.0)
TOTAL LYMPHOCYTE: 23.2 %
WBC mixed population: 914 cells/uL (ref 200–950)
WBC: 10.5 10*3/uL (ref 3.8–10.8)

## 2018-05-27 LAB — HEPATITIS C ANTIBODY
HEP C AB: NONREACTIVE
SIGNAL TO CUT-OFF: 0.01 (ref <1.00)

## 2018-05-27 LAB — LIPID PANEL
Cholesterol: 161 mg/dL (ref <200)
HDL: 57 mg/dL (ref >50)
LDL Cholesterol (Calc): 84 mg/dL (calc)
Non-HDL Cholesterol (Calc): 104 mg/dL (calc) (ref <130)
Total CHOL/HDL Ratio: 2.8 (calc) (ref <5.0)
Triglycerides: 102 mg/dL (ref <150)

## 2018-05-27 LAB — COMPLETE METABOLIC PANEL WITH GFR
AG Ratio: 1.5 (calc) (ref 1.0–2.5)
ALBUMIN MSPROF: 3.9 g/dL (ref 3.6–5.1)
ALKALINE PHOSPHATASE (APISO): 90 U/L (ref 33–130)
ALT: 7 U/L (ref 6–29)
AST: 10 U/L (ref 10–35)
BUN / CREAT RATIO: 22 (calc) (ref 6–22)
BUN: 22 mg/dL (ref 7–25)
CO2: 29 mmol/L (ref 20–32)
CREATININE: 0.98 mg/dL — AB (ref 0.60–0.93)
Calcium: 9.5 mg/dL (ref 8.6–10.4)
Chloride: 102 mmol/L (ref 98–110)
GFR, Est African American: 66 mL/min/{1.73_m2} (ref >=60)
GFR, Est Non African American: 57 mL/min/{1.73_m2} — ABNORMAL LOW (ref >=60)
Globulin: 2.6 g/dL (calc) (ref 1.9–3.7)
Glucose, Bld: 94 mg/dL (ref 65–99)
Potassium: 3.7 mmol/L (ref 3.5–5.3)
Sodium: 141 mmol/L (ref 135–146)
Total Bilirubin: 0.4 mg/dL (ref 0.2–1.2)
Total Protein: 6.5 g/dL (ref 6.1–8.1)

## 2018-05-27 LAB — HEMOGLOBIN A1C
HEMOGLOBIN A1C: 6.2 %{Hb} — AB (ref <5.7)
Mean Plasma Glucose: 131 (calc)
eAG (mmol/L): 7.3 (calc)

## 2018-07-13 ENCOUNTER — Encounter: Payer: Self-pay | Admitting: Family Medicine

## 2018-07-13 ENCOUNTER — Ambulatory Visit: Payer: Medicare Other | Admitting: Family Medicine

## 2018-07-13 VITALS — BP 120/80 | HR 91 | Temp 97.7°F | Resp 15 | Ht 66.25 in | Wt 128.1 lb

## 2018-07-13 DIAGNOSIS — J441 Chronic obstructive pulmonary disease with (acute) exacerbation: Secondary | ICD-10-CM | POA: Diagnosis not present

## 2018-07-13 MED ORDER — PREDNISONE 20 MG PO TABS: ORAL_TABLET | ORAL | 0 refills | Status: DC

## 2018-07-13 MED ORDER — AZITHROMYCIN 250 MG PO TABS: ORAL_TABLET | ORAL | 0 refills | Status: DC

## 2018-07-13 NOTE — Progress Notes (Signed)
Patient ID: Diane Patterson, female    DOB: December 25, 1945, 73 y.o.   MRN: [id number]  PCP: Susy Frizzle, MD  Chief Complaint  Patient presents with  . COPD    Patient in today with severe congestion and sob. Onset 1.5 weeks ago.    Subjective:   Diane Patterson is a 73 y.o. female, presents to clinic with CC of COPD exacerbation  Coughing productive mucuous "coughing up chunks of stuff" sputum described as green to milky and yellow, sometimes like "glue"  1.5 weeks started getting worse than baseline, more SOB and tired, O2 at 2L, has not increased it, but too tired to do anything around the house, right now cannot fix her own food or do dishes, not sleeping well, coughing and SOB too much at night so not sleeping well Doing nebulizer treatments, but only a couple of treatments she says "I figured out that it makes me worse"  Emergency inhaler in the last week - 3-4 x  3d ago - suddenly worse coughing, breathing, with chills Sometimes hurts when she breaths  No sick contacts.   Patient Active Problem List   Diagnosis Date Noted  . Diabetes mellitus type 2, uncomplicated ([place]) 15/17/6160  . Atrial fibrillation with RVR ([place])   . AKI (acute kidney injury) ([place]) 07/11/2017  . Chronic respiratory failure with hypoxia ([place]) 11/15/2016  . Allergic rhinitis 10/14/2016  . Contusion 12/01/2015  . Abscess of umbilicus 73/71/0626  . COPD exacerbation ([place]) 06/23/2015  . Right carotid bruit 08/19/2014  . Hyperlipidemia 08/19/2014  . Prediabetes 08/19/2014  . Bruising 09/26/2013  . History of basal cell cancer 09/26/2013  . Oral candidiasis 01/10/2013  . Essential hypertension 12/21/2011  . COPD (chronic obstructive pulmonary disease) ([place]) 01/20/2011  . Constipation 06/24/2010  . ABDOMINAL BLOATING 06/24/2010  . ATHEROSCLEROSIS OF AORTA 02/10/2010  . FATTY LIVER DISEASE 02/10/2010  . Anxiety state 05/14/2008  . Former smoker 12/28/2006  . VENOUS INSUFFICIENCY  12/28/2006     Prior to Admission medications   Medication Sig Start Date End Date Taking? Authorizing Provider  acetaminophen (TYLENOL) 325 MG tablet Take 650 mg by mouth every 6 (six) hours as needed for mild pain.    Yes [provider]  albuterol (PROVENTIL HFA;VENTOLIN HFA) 108 (90 Base) MCG/ACT inhaler Inhale 2 puffs into the lungs every 4 (four) hours as needed for wheezing or shortness of breath. 05/26/18  Yes Delsa Grana, PA-C  albuterol (PROVENTIL) (2.5 MG/3ML) 0.083% nebulizer solution Take 3 mLs (2.5 mg total) by nebulization every 6 (six) hours as needed for wheezing or shortness of breath. 05/26/18  Yes Delsa Grana, PA-C  apixaban (ELIQUIS) 5 MG TABS tablet Take 1 tablet (5 mg total) by mouth 2 (two) times daily. 05/01/18  Yes Jerline Pain, MD  benzonatate (TESSALON) 100 MG capsule Take 1 capsule (100 mg total) by mouth 3 (three) times daily as needed for cough. 05/26/18  Yes Delsa Grana, PA-C  budesonide-formoterol (SYMBICORT) 160-4.5 MCG/ACT inhaler Inhale 2 puffs into the lungs 2 (two) times daily. 02/20/18  Yes Lauraine Rinne, NP  diltiazem (CARDIZEM CD) 240 MG 24 hr capsule Take 1 capsule (240 mg total) by mouth daily. 05/01/18  Yes Jerline Pain, MD  hydrochlorothiazide (MICROZIDE) 12.5 MG capsule Take 2 capsules (25 mg total) by mouth daily. 05/01/18  Yes Jerline Pain, MD  potassium chloride SA (K-DUR,KLOR-CON) 20 MEQ tablet Take 20 mEq by mouth daily.   Yes [provider]  No Known Allergies   Family History  Problem Relation Age of Onset  . Diabetes Mother   . Emphysema Father   . Asthma Father   . Heart disease Father      Social History   Socioeconomic History  . Marital status: Married    Spouse name: Not on file  . Number of children: 1  . Years of education: Not on file  . Highest education level: Not on file  Occupational History  . Not on file  Social Needs  . Financial resource strain: Not on file  . Food insecurity:      Worry: Not on file    Inability: Not on file  . Transportation needs:    Medical: Not on file    Non-medical: Not on file  Tobacco Use  . Smoking status: Former Smoker    Packs/day: 0.75    Years: 15.00    Pack years: 11.25    Types: Cigarettes    Last attempt to quit: 03/20/2014    Years since quitting: 4.3  . Smokeless tobacco: Former Systems developer    Quit date: 03/11/2014  Substance and Sexual Activity  . Alcohol use: No    Alcohol/week: 0.0 standard drinks  . Drug use: No  . Sexual activity: Not on file  Lifestyle  . Physical activity:    Days per week: Not on file    Minutes per session: Not on file  . Stress: Not on file  Relationships  . Social connections:    Talks on phone: Not on file    Gets together: Not on file    Attends religious service: Not on file    Active member of club or organization: Not on file    Attends meetings of clubs or organizations: Not on file    Relationship status: Not on file  . Intimate partner violence:    Fear of current or ex partner: Not on file    Emotionally abused: Not on file    Physically abused: Not on file    Forced sexual activity: Not on file  Other Topics Concern  . Not on file  Social History Narrative  . Not on file     Review of Systems  Constitutional: Negative.   HENT: Negative.   Eyes: Negative.   Respiratory: Negative.   Cardiovascular: Negative.   Gastrointestinal: Negative.   Endocrine: Negative.   Genitourinary: Negative.   Musculoskeletal: Negative.   Skin: Negative.   Allergic/Immunologic: Negative.   Neurological: Negative.   Hematological: Negative.   Psychiatric/Behavioral: Negative.   All other systems reviewed and are negative.      Objective:    Vitals:   07/13/18 1144  BP: 120/80  Pulse: 91  Resp: 15  Temp: 97.7 F (36.5 C)  TempSrc: Oral  SpO2: 100%  Weight: 128 lb 2 oz (58.1 kg)  Height: 5' 6.25" (1.683 m)      Physical Exam Vitals signs and nursing note reviewed.   Constitutional:      General: She is not in acute distress.    Appearance: She is well-developed. She is not ill-appearing, toxic-appearing or diaphoretic.     Comments: Chronically ill-appearing elderly female, appears stated age, slightly frail-appearing, on 2 L oxygen via nasal cannula, coughing frequently, NAD  HENT:     Head: Normocephalic and atraumatic.     Right Ear: Hearing, tympanic membrane, ear canal and external ear normal.     Left Ear: Hearing, tympanic membrane, ear canal and external  ear normal.     Nose: Mucosal edema and rhinorrhea present. No congestion.     Right Sinus: No maxillary sinus tenderness or frontal sinus tenderness.     Left Sinus: No maxillary sinus tenderness or frontal sinus tenderness.     Mouth/Throat:     Mouth: Mucous membranes are moist. Mucous membranes are not pale.     Pharynx: Uvula midline. Posterior oropharyngeal erythema (mild) present. No oropharyngeal exudate or uvula swelling.     Tonsils: No tonsillar abscesses.  Eyes:     General: No scleral icterus.       Right eye: No discharge.        Left eye: No discharge.     Conjunctiva/sclera: Conjunctivae normal.     Pupils: Pupils are equal, round, and reactive to light.  Neck:     Musculoskeletal: Normal range of motion and neck supple.     Trachea: No tracheal deviation.  Cardiovascular:     Rate and Rhythm: Regular rhythm. Tachycardia present.     Pulses: Normal pulses.          Radial pulses are 2+ on the right side and 2+ on the left side.     Heart sounds: No murmur. No friction rub. No gallop.   Pulmonary:     Effort: Pulmonary effort is normal. No tachypnea, accessory muscle usage, respiratory distress or retractions.     Breath sounds: No stridor. Wheezing and rhonchi present. No rales.     Comments: Diminished breath sounds throughout all lung fields, increased AP diameter Abdominal:     General: Bowel sounds are normal. There is no distension.     Palpations: Abdomen is  soft. There is no mass.     Tenderness: There is no abdominal tenderness. There is no guarding.     Hernia: No hernia is present.  Musculoskeletal: Normal range of motion.     Right lower leg: No edema.     Left lower leg: No edema.  Skin:    General: Skin is warm and dry.     Coloration: Skin is not pale.     Findings: No rash.     Comments: Scattered bruising to hands and forearms bilaterally  Neurological:     Mental Status: She is alert.     Motor: No abnormal muscle tone.     Coordination: Coordination normal.  Psychiatric:        Mood and Affect: Mood normal.        Behavior: Behavior normal.           Assessment & Plan:      ICD-10-CM   1. COPD with acute exacerbation (HCC) J44.1 predniSONE (DELTASONE) 20 MG tablet    azithromycin (ZITHROMAX) 250 MG tablet    Pt with URI sx x 1.5 weeks followed by worsening cough, sputum production, SOB, using nebs more frequently.  No distress, not increasing 2L flow O2 via [place]. Wheezy on exam -tx as noted above.  F/up if not improving.  Shes had several exacerbations in the past couple months, may need to add a maintenance inhaler?   Delsa Grana, PA-C 07/13/18 12:11 PM

## 2018-07-15 ENCOUNTER — Encounter: Payer: Self-pay | Admitting: Family Medicine

## 2018-07-20 ENCOUNTER — Other Ambulatory Visit: Payer: Self-pay | Admitting: Pulmonary Disease

## 2018-07-20 ENCOUNTER — Telehealth: Payer: Self-pay | Admitting: Emergency Medicine

## 2018-07-20 DIAGNOSIS — J441 Chronic obstructive pulmonary disease with (acute) exacerbation: Secondary | ICD-10-CM

## 2018-07-20 MED ORDER — BUDESONIDE-FORMOTEROL FUMARATE 160-4.5 MCG/ACT IN AERO: INHALATION_SPRAY | RESPIRATORY_TRACT | 2 refills | Status: DC

## 2018-07-20 NOTE — Telephone Encounter (Signed)
Called and spoke to pt.  Pt is requesting Rx for Symbicort 160.  Rx has been sent to preferred pharmacy. Nothing further is needed.

## 2018-07-21 ENCOUNTER — Encounter: Payer: Self-pay | Admitting: Family Medicine

## 2018-07-21 ENCOUNTER — Ambulatory Visit (INDEPENDENT_AMBULATORY_CARE_PROVIDER_SITE_OTHER): Payer: Medicare Other | Admitting: Family Medicine

## 2018-07-21 VITALS — BP 160/84 | HR 51 | Temp 97.6°F | Resp 17 | Ht 66.25 in | Wt 129.1 lb

## 2018-07-21 DIAGNOSIS — J441 Chronic obstructive pulmonary disease with (acute) exacerbation: Secondary | ICD-10-CM | POA: Diagnosis not present

## 2018-07-21 MED ORDER — IPRATROPIUM-ALBUTEROL 0.5-2.5 (3) MG/3ML IN SOLN: 3.0000 mL | Freq: Two times a day (BID) | RESPIRATORY_TRACT | 0 refills | Status: DC | PRN

## 2018-07-21 MED ORDER — METHYLPREDNISOLONE ACETATE 80 MG/ML IJ SUSP
80.0000 mg | Freq: Once | INTRAMUSCULAR | Status: AC
  Administered 2018-07-21: 80 mg via INTRAMUSCULAR

## 2018-07-21 MED ORDER — PREDNISONE 20 MG PO TABS: ORAL_TABLET | ORAL | 0 refills | Status: DC

## 2018-07-21 MED ORDER — IPRATROPIUM-ALBUTEROL 0.5-2.5 (3) MG/3ML IN SOLN
3.0000 mL | Freq: Once | RESPIRATORY_TRACT | Status: AC
  Administered 2018-07-21: 3 mL via RESPIRATORY_TRACT

## 2018-07-21 MED ORDER — MEDROXYPROGESTERONE ACETATE 150 MG/ML IM SUSP: 150.0000 mg | Freq: Once | INTRAMUSCULAR | Status: DC

## 2018-07-21 MED ORDER — LEVOFLOXACIN 500 MG PO TABS: 500.0000 mg | ORAL_TABLET | Freq: Every day | ORAL | 0 refills | Status: DC

## 2018-07-21 NOTE — Progress Notes (Signed)
Patient ID: Diane Patterson, female    DOB: [birth year], 73 y.o.   MRN: [id number]  PCP: Susy Frizzle, MD  Chief Complaint  Patient presents with  . COPD    Patient in for a COPD exacerbation    Subjective:   Diane Patterson is a 73 y.o. female, presents to clinic with CC of worsening exacerbation of COPD despite being seen and treated last week with steroids, nebs and zpak last week.  She actually walked in w/o appt this afternoon stating she was here for a shot, no shots were arranged or planned.  She was put on schedule to assess due to her complaints of acute worsening  She endorses worsening SOB - more short of breath and wheezy with any walking. Says prednisone 10 mg tab "don't work". She was prescribed 20 mg tabs of predisone with a slightly longer taper.  She continues to say the 10 mg don't work for her and she is on the last day.  Sounds like she is still taking the same dose per day.   We called and verified with pharmacy that she was taking the correct dose of steroids but they were out of 20 mg tablets and had given her 10 mg tablets with a very long taper she is taking her correct amount and has taken correct dose for correct # of days, today is last day of steroids.  Believes she is confused and came in asking for a steroid shot because we briefly discussed that last week.  Very SOB today - she "couldn't walk a few feet" Used rescue only once in the last week She took zpak, tolerated without side effects no diarrhea She states that the "weather is bothering her" and husband is home with the same.  She also asks how you "get rid of COPD." She continues to use her oxygen she has not increased the flow. No fever, CP, near syncope, LE edema, palpitations.    Patient Active Problem List   Diagnosis Date Noted  . Diabetes mellitus type 2, uncomplicated ([place]) 08/26/9456  . Atrial fibrillation with RVR ([place])   . AKI (acute kidney injury) ([place]) 07/11/2017  .  Chronic respiratory failure with hypoxia ([place]) 11/15/2016  . Allergic rhinitis 10/14/2016  . Contusion 12/01/2015  . Abscess of umbilicus 59/29/2446  . COPD exacerbation ([place]) 06/23/2015  . Right carotid bruit 08/19/2014  . Hyperlipidemia 08/19/2014  . Prediabetes 08/19/2014  . Bruising 09/26/2013  . History of basal cell cancer 09/26/2013  . Oral candidiasis 01/10/2013  . Essential hypertension 12/21/2011  . COPD (chronic obstructive pulmonary disease) ([place]) 01/20/2011  . Constipation 06/24/2010  . ABDOMINAL BLOATING 06/24/2010  . ATHEROSCLEROSIS OF AORTA 02/10/2010  . FATTY LIVER DISEASE 02/10/2010  . Anxiety state 05/14/2008  . Former smoker 12/28/2006  . VENOUS INSUFFICIENCY 12/28/2006     Prior to Admission medications   Medication Sig Start Date End Date Taking? Authorizing Provider  acetaminophen (TYLENOL) 325 MG tablet Take 650 mg by mouth every 6 (six) hours as needed for mild pain.    Yes [provider]  albuterol (PROVENTIL HFA;VENTOLIN HFA) 108 (90 Base) MCG/ACT inhaler Inhale 2 puffs into the lungs every 4 (four) hours as needed for wheezing or shortness of breath. 05/26/18  Yes Delsa Grana, PA-C  albuterol (PROVENTIL) (2.5 MG/3ML) 0.083% nebulizer solution Take 3 mLs (2.5 mg total) by nebulization every 6 (six) hours as needed for wheezing or shortness of breath. 05/26/18  Yes Delsa Grana, PA-C  apixaban (ELIQUIS) 5 MG TABS tablet Take 1 tablet (5 mg total) by mouth 2 (two) times daily. 05/01/18  Yes Jerline Pain, MD  azithromycin (ZITHROMAX) 250 MG tablet Take 2 tabs (500 mg) PO q d for 1d, then take 1 tab (250 mg) PO q d for day 2-5 07/13/18  Yes Delsa Grana, PA-C  benzonatate (TESSALON) 100 MG capsule Take 1 capsule (100 mg total) by mouth 3 (three) times daily as needed for cough. 05/26/18  Yes Delsa Grana, PA-C  budesonide-formoterol (SYMBICORT) 160-4.5 MCG/ACT inhaler INHALE 2 PUFFS INTO LUNGS TWICE DAILY 07/20/18  Yes Byrum, Rose Fillers, MD  diltiazem  (CARDIZEM CD) 240 MG 24 hr capsule Take 1 capsule (240 mg total) by mouth daily. 05/01/18  Yes Jerline Pain, MD  hydrochlorothiazide (MICROZIDE) 12.5 MG capsule Take 2 capsules (25 mg total) by mouth daily. 05/01/18  Yes Jerline Pain, MD  potassium chloride SA (K-DUR,KLOR-CON) 20 MEQ tablet Take 20 mEq by mouth daily.   Yes [provider]  predniSONE (DELTASONE) 20 MG tablet 3 tabs poqday 1-3, 2 tabs poqday 4-6, 1 tab poqday 7-9 07/13/18  Yes Delsa Grana, PA-C     No Known Allergies   Family History  Problem Relation Age of Onset  . Diabetes Mother   . Emphysema Father   . Asthma Father   . Heart disease Father      Social History   Socioeconomic History  . Marital status: Married    Spouse name: Not on file  . Number of children: 1  . Years of education: Not on file  . Highest education level: Not on file  Occupational History  . Not on file  Social Needs  . Financial resource strain: Not on file  . Food insecurity:    Worry: Not on file    Inability: Not on file  . Transportation needs:    Medical: Not on file    Non-medical: Not on file  Tobacco Use  . Smoking status: Former Smoker    Packs/day: 0.75    Years: 15.00    Pack years: 11.25    Types: Cigarettes    Last attempt to quit: 03/20/2014    Years since quitting: 4.3  . Smokeless tobacco: Former Systems developer    Quit date: 03/11/2014  Substance and Sexual Activity  . Alcohol use: No    Alcohol/week: 0.0 standard drinks  . Drug use: No  . Sexual activity: Not on file  Lifestyle  . Physical activity:    Days per week: Not on file    Minutes per session: Not on file  . Stress: Not on file  Relationships  . Social connections:    Talks on phone: Not on file    Gets together: Not on file    Attends religious service: Not on file    Active member of club or organization: Not on file    Attends meetings of clubs or organizations: Not on file    Relationship status: Not on file  . Intimate partner  violence:    Fear of current or ex partner: Not on file    Emotionally abused: Not on file    Physically abused: Not on file    Forced sexual activity: Not on file  Other Topics Concern  . Not on file  Social History Narrative  . Not on file     Review of Systems  Constitutional: Negative.  Negative for fever.  HENT: Negative.   Eyes: Negative.  Respiratory: Positive for cough, shortness of breath and wheezing.   Cardiovascular: Negative.  Negative for chest pain, palpitations and leg swelling.  Gastrointestinal: Negative.   Endocrine: Negative.   Genitourinary: Negative.   Musculoskeletal: Negative.   Skin: Negative.   Allergic/Immunologic: Negative.   Neurological: Negative.   Hematological: Negative.   Psychiatric/Behavioral: Negative.   All other systems reviewed and are negative.      Objective:    Vitals:   07/21/18 1622  BP: (!) 160/84  Pulse: (!) 51  Resp: 17  Temp: 97.6 F (36.4 C)  TempSrc: Oral  SpO2: 99%  Weight: 129 lb 2 oz (58.6 kg)    HR 60  Physical Exam Vitals signs and nursing note reviewed.  Constitutional:      General: She is not in acute distress.    Appearance: She is well-developed. She is not toxic-appearing or diaphoretic.     Comments: Chronically ill-appearing elderly female, appears stated age, slightly frail-appearing, on 2 L oxygen via nasal cannula, NAD, alert  HENT:     Head: Normocephalic and atraumatic.     Right Ear: Hearing, tympanic membrane, ear canal and external ear normal.     Left Ear: Hearing, tympanic membrane, ear canal and external ear normal.     Nose: Mucosal edema, congestion and rhinorrhea present.     Right Sinus: No maxillary sinus tenderness or frontal sinus tenderness.     Left Sinus: No maxillary sinus tenderness or frontal sinus tenderness.     Mouth/Throat:     Mouth: Mucous membranes are moist. Mucous membranes are not pale.     Pharynx: Oropharynx is clear. Uvula midline. No oropharyngeal  exudate, posterior oropharyngeal erythema (mild) or uvula swelling.     Tonsils: No tonsillar abscesses.  Eyes:     General: No scleral icterus.       Right eye: No discharge.        Left eye: No discharge.     Conjunctiva/sclera: Conjunctivae normal.  Neck:     Musculoskeletal: Normal range of motion and neck supple.     Trachea: No tracheal deviation.  Cardiovascular:     Rate and Rhythm: Normal rate and regular rhythm.     Pulses: Normal pulses.          Radial pulses are 2+ on the right side and 2+ on the left side.     Heart sounds: No murmur. No friction rub. No gallop.      Comments: HR 61 at time of PE Pulmonary:     Effort: Pulmonary effort is normal. No tachypnea, accessory muscle usage, respiratory distress or retractions.     Breath sounds: No stridor. Wheezing and rhonchi present. No rales.     Comments: Diminished breath sounds throughout all lung fields, increased AP diameter, scattered rhonchi, faint expiratory wheeze Chest:     Chest wall: No tenderness.  Abdominal:     General: Bowel sounds are normal. There is no distension.     Palpations: Abdomen is soft. There is no mass.     Tenderness: There is no abdominal tenderness. There is no guarding.     Hernia: No hernia is present.  Musculoskeletal: Normal range of motion.     Right lower leg: No edema.     Left lower leg: No edema.  Lymphadenopathy:     Cervical: No cervical adenopathy.  Skin:    General: Skin is warm and dry.     Capillary Refill: Capillary refill takes less than 2  seconds.     Coloration: Skin is not pale.     Findings: No rash.     Comments: Scattered bruising to hands and forearms bilaterally  Neurological:     Mental Status: She is alert.     Motor: No abnormal muscle tone.     Coordination: Coordination normal.  Psychiatric:        Mood and Affect: Mood normal.           Assessment & Plan:      ICD-10-CM   1. COPD with acute exacerbation (HCC) J44.1 methylPREDNISolone  acetate (DEPO-MEDROL) injection 80 mg    DISCONTINUED: medroxyPROGESTERone (DEPO-PROVERA) injection 150 mg    Pt has not improved and she says she's worsened despite a longer dose of steroid and zpak - she does not seem to be using albuterol very much at all, but she is compliant with her maintenance inhaler - she may need triple therapy - I have seen her several times for AECOPD and she is not my pt.  She does say that allergies and weather changes have made her sx worse as well.   She also asks "how to you get rid of COPD" and we discussed the chronic nature of her disease and managing symptoms.    Redid steroids rx to different pharmacy 20 mg tabs with 6 day taper, add levaquin, gave steroid shot in clinic, (pt to start steroids tomorrow),encouraged her to do more frequent nebs and have SABA available.   Close follow up with PCP, may need triple therapy for COPD?  She is on O2 chronically and does not seem to understand chronic nature of Dx, not sure what her advanced directives are, or if she has ever talked to palliative care pulmonology?   Her husband is ill as well, she agrees to both come in early next week to see PCP to be rechecked.    Delsa Grana, PA-C 07/21/18 4:38 PM

## 2018-07-25 ENCOUNTER — Encounter: Payer: Self-pay | Admitting: Family Medicine

## 2018-07-25 ENCOUNTER — Ambulatory Visit (INDEPENDENT_AMBULATORY_CARE_PROVIDER_SITE_OTHER): Payer: Medicare Other | Admitting: Family Medicine

## 2018-07-25 VITALS — BP 152/90 | HR 72 | Temp 98.2°F | Resp 20 | Wt 137.0 lb

## 2018-07-25 DIAGNOSIS — J441 Chronic obstructive pulmonary disease with (acute) exacerbation: Secondary | ICD-10-CM | POA: Diagnosis not present

## 2018-07-25 MED ORDER — LEVOFLOXACIN 500 MG PO TABS: 500.0000 mg | ORAL_TABLET | Freq: Every day | ORAL | 0 refills | Status: AC

## 2018-07-25 MED ORDER — PREDNISONE 20 MG PO TABS: 60.0000 mg | ORAL_TABLET | Freq: Every day | ORAL | 0 refills | Status: DC

## 2018-07-25 NOTE — Addendum Note (Signed)
Addended by: Jenna Luo T on: 07/25/2018 02:30 PM   Modules accepted: Orders

## 2018-07-25 NOTE — Progress Notes (Signed)
Subjective:    Patient ID: Diane Patterson, female    DOB: January 23, 1946, 73 y.o.   MRN: [id number]  HPI  04/2018 Patient symptoms began last week on Thursday.  Symptoms include increased sputum production, a change in the consistency of the sputum.  It is now thick and white.  An increase work of breathing, increasing shortness of breath.  She is becoming progressively more winded with minimal activity.  She denies any fevers.  She denies any chills.  She denies any hemoptysis.  She is currently on Symbicort.  She uses her rescue inhaler 2-3 times a day as needed.  She see some benefit from this when she does not.  Her pulmonologist called out a prednisone taper beginning at 40 mg.  Patient is currently on 30 mg however she states that she has not improved.  She reports increasing chest congestion.  At that time, my plan was: I believe the patient has a COPD exacerbation.  I will increase her prednisone to 60 mg a day for 2 days then 40 mg a day for 2 days then 20 mg a day for 2 days.  In addition I will start the patient on a Z-Pak to cover atypical bacteria given the increased sputum production and the change in sputum consistency.  Continue Symbicort but use albuterol every 4-6 hours as needed for wheezing or shortness of breath.  Recheck immediately if worsening.  I will also try to arrange to have the patient receive a portable oxygen concentrator.  She has a difficult time using the portable oxygen tanks as she is unable to push around the heavy metal canisters using the rolling cart given her frail state  07/25/18 Since I last saw the patient, she has seen my partner on several occasion for recurrent COPD exacerbations.  She was seen in December and was started on doxycycline with prednisone.  She was seen in January and was started on a Z-Pak and prednisone.  She was recently seen February 7 and was given prednisone and Levaquin.  She is here today for follow-up with me  Patient only took 1  day of Levaquin and then somehow lost the prescription.  Therefore she is not taking any antibiotics since the first day.  She has taken the prednisone which is helped some however she is on 20 mg right now and continues to report increased shortness of breath, wheezing, cough productive of sputum which is yellow and brown, bilateral pleurisy, and subjective fevers.  She is using her rescue inhaler 3 times a day as well as her Symbicort.  She denies any angina.  She denies any pitting edema in her extremities.  She denies any palpitations Past Medical History:  Diagnosis Date  . Asthma   . Chronic airflow obstruction (HCC)   . Hypertension   . Tobacco abuse   . Venous insufficiency    Past Surgical History:  Procedure Laterality Date  . cataract    . NECK SURGERY    . no colonoscopy     "afraid to "; [facility] reviewed  . VESICOVAGINAL FISTULA CLOSURE W/ TAH     Current Outpatient Medications on File Prior to Visit  Medication Sig Dispense Refill  . acetaminophen (TYLENOL) 325 MG tablet Take 650 mg by mouth every 6 (six) hours as needed for mild pain.     Marland Kitchen albuterol (PROVENTIL HFA;VENTOLIN HFA) 108 (90 Base) MCG/ACT inhaler Inhale 2 puffs into the lungs every 4 (four) hours as needed for wheezing or  shortness of breath. 1 Inhaler 5  . albuterol (PROVENTIL) (2.5 MG/3ML) 0.083% nebulizer solution Take 3 mLs (2.5 mg total) by nebulization every 6 (six) hours as needed for wheezing or shortness of breath. 150 mL 1  . apixaban (ELIQUIS) 5 MG TABS tablet Take 1 tablet (5 mg total) by mouth 2 (two) times daily. 60 tablet 11  . benzonatate (TESSALON) 100 MG capsule Take 1 capsule (100 mg total) by mouth 3 (three) times daily as needed for cough. 30 capsule 0  . budesonide-formoterol (SYMBICORT) 160-4.5 MCG/ACT inhaler INHALE 2 PUFFS INTO LUNGS TWICE DAILY 10.2 Inhaler 2  . diltiazem (CARDIZEM CD) 240 MG 24 hr capsule Take 1 capsule (240 mg total) by mouth daily. 30 capsule 11  . hydrochlorothiazide  (MICROZIDE) 12.5 MG capsule Take 2 capsules (25 mg total) by mouth daily. 60 capsule 11  . ipratropium-albuterol (DUONEB) 0.5-2.5 (3) MG/3ML SOLN Take 3 mLs by nebulization 2 (two) times daily as needed (wheeze and shortness of breath). 360 mL 0  . levofloxacin (LEVAQUIN) 500 MG tablet Take 1 tablet (500 mg total) by mouth daily for 5 days. 5 tablet 0  . potassium chloride SA (K-DUR,KLOR-CON) 20 MEQ tablet Take 20 mEq by mouth daily.    . predniSONE (DELTASONE) 20 MG tablet 3 tabs poqday 1-2, 2 tabs poqday 3-4, 1 tab poqday 5-6 12 tablet 0   No current facility-administered medications on file prior to visit.    No Known Allergies Social History   Socioeconomic History  . Marital status: Married    Spouse name: Not on file  . Number of children: 1  . Years of education: Not on file  . Highest education level: Not on file  Occupational History  . Not on file  Social Needs  . Financial resource strain: Not on file  . Food insecurity:    Worry: Not on file    Inability: Not on file  . Transportation needs:    Medical: Not on file    Non-medical: Not on file  Tobacco Use  . Smoking status: Former Smoker    Packs/day: 0.75    Years: 15.00    Pack years: 11.25    Types: Cigarettes    Last attempt to quit: 03/20/2014    Years since quitting: 4.3  . Smokeless tobacco: Former Systems developer    Quit date: 03/11/2014  Substance and Sexual Activity  . Alcohol use: No    Alcohol/week: 0.0 standard drinks  . Drug use: No  . Sexual activity: Not on file  Lifestyle  . Physical activity:    Days per week: Not on file    Minutes per session: Not on file  . Stress: Not on file  Relationships  . Social connections:    Talks on phone: Not on file    Gets together: Not on file    Attends religious service: Not on file    Active member of club or organization: Not on file    Attends meetings of clubs or organizations: Not on file    Relationship status: Not on file  . Intimate partner violence:      Fear of current or ex partner: Not on file    Emotionally abused: Not on file    Physically abused: Not on file    Forced sexual activity: Not on file  Other Topics Concern  . Not on file  Social History Narrative  . Not on file      Review of Systems  All other  systems reviewed and are negative.      Objective:   Physical Exam Vitals signs reviewed.  Constitutional:      Appearance: She is well-developed.  HENT:     Nose: Nose normal.     Mouth/Throat:     Pharynx: No oropharyngeal exudate.  Neck:     Vascular: No JVD.  Cardiovascular:     Rate and Rhythm: Normal rate and regular rhythm.     Heart sounds: Normal heart sounds.  Pulmonary:     Effort: Pulmonary effort is normal. No respiratory distress.     Breath sounds: Decreased breath sounds, wheezing and rales present.  Lymphadenopathy:     Cervical: No cervical adenopathy.           Assessment & Plan:  COPD exacerbation, COPD poorly controlled oxygen dependent  Treat the current COPD exacerbation as previously prescribed with Levaquin 500 mg p.o. daily for 7 days.  Begin prednisone 60 mg a day and reassess the patient on Thursday.  If she is doing better Thursday, will gradually and slowly wean the patient off prednisone by decreasing to 40 mg a day for a week and then gradually decrease to 20 mg a day for a week and then slowly weaning her away from the medication given the frequency and severity of her exacerbations.  I have also asked the patient to discontinue Symbicort and replace with Trelegy 1 inhalation a day.  Patient was given samples to last 1 month and we will reassess and see how she is doing on this when she follows up on Thursday.

## 2018-07-26 ENCOUNTER — Encounter: Payer: Self-pay | Admitting: Family Medicine

## 2018-07-27 ENCOUNTER — Encounter: Payer: Self-pay | Admitting: Family Medicine

## 2018-07-27 ENCOUNTER — Ambulatory Visit: Payer: Medicare Other | Admitting: Family Medicine

## 2018-07-27 VITALS — BP 200/110 | HR 68 | Temp 98.0°F | Resp 18 | Ht 66.5 in | Wt 130.0 lb

## 2018-07-27 DIAGNOSIS — J441 Chronic obstructive pulmonary disease with (acute) exacerbation: Secondary | ICD-10-CM

## 2018-07-27 NOTE — Progress Notes (Signed)
Subjective:    Patient ID: Diane Patterson, female    DOB: May 17, 1946, 73 y.o.   MRN: [id number]  HPI  04/2018 Patient symptoms began last week on Thursday.  Symptoms include increased sputum production, a change in the consistency of the sputum.  It is now thick and white.  An increase work of breathing, increasing shortness of breath.  She is becoming progressively more winded with minimal activity.  She denies any fevers.  She denies any chills.  She denies any hemoptysis.  She is currently on Symbicort.  She uses her rescue inhaler 2-3 times a day as needed.  She see some benefit from this when she does not.  Her pulmonologist called out a prednisone taper beginning at 40 mg.  Patient is currently on 30 mg however she states that she has not improved.  She reports increasing chest congestion.  At that time, my plan was: I believe the patient has a COPD exacerbation.  I will increase her prednisone to 60 mg a day for 2 days then 40 mg a day for 2 days then 20 mg a day for 2 days.  In addition I will start the patient on a Z-Pak to cover atypical bacteria given the increased sputum production and the change in sputum consistency.  Continue Symbicort but use albuterol every 4-6 hours as needed for wheezing or shortness of breath.  Recheck immediately if worsening.  I will also try to arrange to have the patient receive a portable oxygen concentrator.  She has a difficult time using the portable oxygen tanks as she is unable to push around the heavy metal canisters using the rolling cart given her frail state  07/25/18 Since I last saw the patient, she has seen my partner on several occasion for recurrent COPD exacerbations.  She was seen in December and was started on doxycycline with prednisone.  She was seen in January and was started on a Z-Pak and prednisone.  She was recently seen February 7 and was given prednisone and Levaquin.  She is here today for follow-up with me  Patient only took 1  day of Levaquin and then somehow lost the prescription.  Therefore she is not taking any antibiotics since the first day.  She has taken the prednisone which is helped some however she is on 20 mg right now and continues to report increased shortness of breath, wheezing, cough productive of sputum which is yellow and brown, bilateral pleurisy, and subjective fevers.  She is using her rescue inhaler 3 times a day as well as her Symbicort.  She denies any angina.  She denies any pitting edema in her extremities.  She denies any palpitations.  At that time, my plan was: Treat the current COPD exacerbation as previously prescribed with Levaquin 500 mg p.o. daily for 7 days.  Begin prednisone 60 mg a day and reassess the patient on Thursday.  If she is doing better Thursday, will gradually and slowly wean the patient off prednisone by decreasing to 40 mg a day for a week and then gradually decrease to 20 mg a day for a week and then slowly weaning her away from the medication given the frequency and severity of her exacerbations.  I have also asked the patient to discontinue Symbicort and replace with Trelegy 1 inhalation a day.  Patient was given samples to last 1 month and we will reassess and see how she is doing on this when she follows up on Thursday.  07/27/18 Patient's breathing is much improved today.  She is on 60 mg a day of prednisone however her wheezing has improved dramatically.  She is moving more air.  Breath sounds have improved and increased.  She also feels like she is doing better on the Trelegy.  She denies any hemoptysis.  She denies any purulent sputum.  She denies any fever.  Check blood pressure was 150/80.  Elevated blood pressure was after the patient had just walked into the office caring her oxygen container Past Medical History:  Diagnosis Date  . Asthma   . Chronic airflow obstruction (HCC)   . Hypertension   . Tobacco abuse   . Venous insufficiency    Past Surgical History:    Procedure Laterality Date  . cataract    . NECK SURGERY    . no colonoscopy     "afraid to "; [facility] reviewed  . VESICOVAGINAL FISTULA CLOSURE W/ TAH     Current Outpatient Medications on File Prior to Visit  Medication Sig Dispense Refill  . acetaminophen (TYLENOL) 325 MG tablet Take 650 mg by mouth every 6 (six) hours as needed for mild pain.     Marland Kitchen albuterol (PROVENTIL HFA;VENTOLIN HFA) 108 (90 Base) MCG/ACT inhaler Inhale 2 puffs into the lungs every 4 (four) hours as needed for wheezing or shortness of breath. 1 Inhaler 5  . albuterol (PROVENTIL) (2.5 MG/3ML) 0.083% nebulizer solution Take 3 mLs (2.5 mg total) by nebulization every 6 (six) hours as needed for wheezing or shortness of breath. 150 mL 1  . apixaban (ELIQUIS) 5 MG TABS tablet Take 1 tablet (5 mg total) by mouth 2 (two) times daily. 60 tablet 11  . budesonide-formoterol (SYMBICORT) 160-4.5 MCG/ACT inhaler INHALE 2 PUFFS INTO LUNGS TWICE DAILY 10.2 Inhaler 2  . diltiazem (CARDIZEM CD) 240 MG 24 hr capsule Take 1 capsule (240 mg total) by mouth daily. 30 capsule 11  . hydrochlorothiazide (MICROZIDE) 12.5 MG capsule Take 2 capsules (25 mg total) by mouth daily. 60 capsule 11  . ipratropium-albuterol (DUONEB) 0.5-2.5 (3) MG/3ML SOLN Take 3 mLs by nebulization 2 (two) times daily as needed (wheeze and shortness of breath). 360 mL 0  . levofloxacin (LEVAQUIN) 500 MG tablet Take 1 tablet (500 mg total) by mouth daily for 7 days. 7 tablet 0  . potassium chloride SA (K-DUR,KLOR-CON) 20 MEQ tablet Take 20 mEq by mouth daily.    . predniSONE (DELTASONE) 20 MG tablet Take 3 tablets (60 mg total) by mouth daily with breakfast. 90 tablet 0  . benzonatate (TESSALON) 100 MG capsule Take 1 capsule (100 mg total) by mouth 3 (three) times daily as needed for cough. (Patient not taking: Reported on 07/27/2018) 30 capsule 0   No current facility-administered medications on file prior to visit.    No Known Allergies Social History   Socioeconomic  History  . Marital status: Married    Spouse name: Not on file  . Number of children: 1  . Years of education: Not on file  . Highest education level: Not on file  Occupational History  . Not on file  Social Needs  . Financial resource strain: Not on file  . Food insecurity:    Worry: Not on file    Inability: Not on file  . Transportation needs:    Medical: Not on file    Non-medical: Not on file  Tobacco Use  . Smoking status: Former Smoker    Packs/day: 0.75    Years: 15.00  Pack years: 11.25    Types: Cigarettes    Last attempt to quit: 03/20/2014    Years since quitting: 4.3  . Smokeless tobacco: Former Systems developer    Quit date: 03/11/2014  Substance and Sexual Activity  . Alcohol use: No    Alcohol/week: 0.0 standard drinks  . Drug use: No  . Sexual activity: Not on file  Lifestyle  . Physical activity:    Days per week: Not on file    Minutes per session: Not on file  . Stress: Not on file  Relationships  . Social connections:    Talks on phone: Not on file    Gets together: Not on file    Attends religious service: Not on file    Active member of club or organization: Not on file    Attends meetings of clubs or organizations: Not on file    Relationship status: Not on file  . Intimate partner violence:    Fear of current or ex partner: Not on file    Emotionally abused: Not on file    Physically abused: Not on file    Forced sexual activity: Not on file  Other Topics Concern  . Not on file  Social History Narrative  . Not on file      Review of Systems  All other systems reviewed and are negative.      Objective:   Physical Exam Vitals signs reviewed.  Constitutional:      Appearance: She is well-developed.  HENT:     Nose: Nose normal.     Mouth/Throat:     Pharynx: No oropharyngeal exudate.  Neck:     Vascular: No JVD.  Cardiovascular:     Rate and Rhythm: Normal rate and regular rhythm.     Heart sounds: Normal heart sounds.  Pulmonary:       Effort: Pulmonary effort is normal. No respiratory distress.     Breath sounds: Decreased breath sounds present. No wheezing or rales.  Lymphadenopathy:     Cervical: No cervical adenopathy.           Assessment & Plan:  COPD with acute exacerbation ([place])  COPD exacerbation is improved.  Decrease prednisone to 40 mg a day for 1 week then 20 mg a day for 3 days then 10 mg a day for 3 days then stop.  Complete Levaquin as prescribed.  Repeat blood pressure had improved.  I believe the first is falsely elevated due to the patient walking into the office and caring her oxygen tank.  After sitting on the exam table, her blood pressure had improved to 150/80.  Continue Trelegy as prescribed.

## 2018-08-01 ENCOUNTER — Encounter: Payer: Self-pay | Admitting: Adult Health

## 2018-08-01 ENCOUNTER — Ambulatory Visit (INDEPENDENT_AMBULATORY_CARE_PROVIDER_SITE_OTHER)
Admission: RE | Admit: 2018-08-01 | Discharge: 2018-08-01 | Disposition: A | Discharge status: Home / Self Care | Payer: Medicare Other | Source: Ambulatory Visit | Attending: Adult Health | Admitting: Adult Health

## 2018-08-01 ENCOUNTER — Ambulatory Visit: Payer: Medicare Other | Admitting: Emergency Medicine

## 2018-08-01 ENCOUNTER — Ambulatory Visit (INDEPENDENT_AMBULATORY_CARE_PROVIDER_SITE_OTHER): Payer: Medicare Other | Admitting: Adult Health

## 2018-08-01 VITALS — BP 126/74 | HR 72

## 2018-08-01 DIAGNOSIS — J9611 Chronic respiratory failure with hypoxia: Secondary | ICD-10-CM

## 2018-08-01 DIAGNOSIS — J439 Emphysema, unspecified: Secondary | ICD-10-CM | POA: Diagnosis not present

## 2018-08-01 DIAGNOSIS — J441 Chronic obstructive pulmonary disease with (acute) exacerbation: Secondary | ICD-10-CM | POA: Diagnosis not present

## 2018-08-01 MED ORDER — FLUTICASONE-UMECLIDIN-VILANT 100-62.5-25 MCG/INH IN AEPB: 1.0000 | INHALATION_SPRAY | Freq: Every day | RESPIRATORY_TRACT | 0 refills | Status: DC

## 2018-08-01 NOTE — Assessment & Plan Note (Signed)
Recurrent exacerbation-slow to resolve We will continue on a very slow steroid taper and hold at 10 mg.  Check sputum culture Chest x-ray today shows no acute process  Plan  Patient Instructions  Taper Prednisone as directed to 10mg  daily - hold at this dose .  Robitussin DM Twice daily As needed  Cough/congestion  Continue on TRELEGY 1 puff daily , rinse after use.  Continue on Oxygen 2lm  Sputum culture .  Chest xray today  Follow up in 4-6 weeks and As needed  With Dr. Lamonte Sakai  Or Kaitrin Seybold NP  Please contact office for sooner follow up if symptoms do not improve or worsen or seek emergency care

## 2018-08-01 NOTE — Progress Notes (Signed)
@Patient  ID: Diane Patterson, female    DOB: [birth year], 73 y.o.   MRN: [id number]  Chief Complaint  Patient presents with  . Follow-up    COPD     Referring provider: Susy Frizzle, MD  HPI: 73 year old female former smoker followed for chronic obstructive asthma andGOLD IVCOPD  TEST  PFT2009, shows very severe obstruction with an FEV1 of approximately 0.73 L, 27%.  2016 FEV1 32%.   08/01/2018 Follow up : COPD  Presents for a follow-up visit.  He says that her breathing has not been doing as well.  She is been having increased cough congestion and frequent flareups.  She has been seen at her primary care provider several times.  And treated with antibiotics and steroids.  Is currently on a steroid taper.  She says that she feels better for a little bit and as soon as she gets off the prednisone symptoms start to slowly return.  She does have thick mucus but is hard to get up.  She says mainly it is clear to white.  She denies any fever, chest pain, orthopnea, increased edema. She is on oxygen 2 L as needed and at bedtime. O2 saturation was 95% on room air today  She was changed to Trelegy inhaler last week.   No Known Allergies  Immunization History  Administered Date(s) Administered  . Influenza Split 03/15/2011, 03/06/2012  . Influenza Whole 03/12/2009, 02/10/2010  . Influenza, High Dose Seasonal PF 04/01/2015, 03/05/2016, 03/18/2017  . Influenza,inj,Quad PF,6+ Mos 03/30/2013, 03/11/2014, 03/19/2017  . Pneumococcal Conjugate-13 11/14/2013  . Pneumococcal Polysaccharide-23 09/11/2010    Past Medical History:  Diagnosis Date  . Asthma   . Chronic airflow obstruction (HCC)   . Hypertension   . Tobacco abuse   . Venous insufficiency     Tobacco History: Social History   Tobacco Use  Smoking Status Former Smoker  . Packs/day: 0.75  . Years: 15.00  . Pack years: 11.25  . Types: Cigarettes  . Last attempt to quit: 03/20/2014  . Years since quitting:  4.3  Smokeless Tobacco Former Systems developer  . Quit date: 03/11/2014   Counseling given: Not Answered   Outpatient Medications Prior to Visit  Medication Sig Dispense Refill  . acetaminophen (TYLENOL) 325 MG tablet Take 650 mg by mouth every 6 (six) hours as needed for mild pain.     Marland Kitchen albuterol (PROVENTIL HFA;VENTOLIN HFA) 108 (90 Base) MCG/ACT inhaler Inhale 2 puffs into the lungs every 4 (four) hours as needed for wheezing or shortness of breath. 1 Inhaler 5  . albuterol (PROVENTIL) (2.5 MG/3ML) 0.083% nebulizer solution Take 3 mLs (2.5 mg total) by nebulization every 6 (six) hours as needed for wheezing or shortness of breath. 150 mL 1  . apixaban (ELIQUIS) 5 MG TABS tablet Take 1 tablet (5 mg total) by mouth 2 (two) times daily. 60 tablet 11  . benzonatate (TESSALON) 100 MG capsule Take 1 capsule (100 mg total) by mouth 3 (three) times daily as needed for cough. (Patient not taking: Reported on 07/27/2018) 30 capsule 0  . budesonide-formoterol (SYMBICORT) 160-4.5 MCG/ACT inhaler INHALE 2 PUFFS INTO LUNGS TWICE DAILY 10.2 Inhaler 2  . diltiazem (CARDIZEM CD) 240 MG 24 hr capsule Take 1 capsule (240 mg total) by mouth daily. 30 capsule 11  . hydrochlorothiazide (MICROZIDE) 12.5 MG capsule Take 2 capsules (25 mg total) by mouth daily. 60 capsule 11  . ipratropium-albuterol (DUONEB) 0.5-2.5 (3) MG/3ML SOLN Take 3 mLs by nebulization 2 (two) times daily  as needed (wheeze and shortness of breath). 360 mL 0  . levofloxacin (LEVAQUIN) 500 MG tablet Take 1 tablet (500 mg total) by mouth daily for 7 days. 7 tablet 0  . potassium chloride SA (K-DUR,KLOR-CON) 20 MEQ tablet Take 20 mEq by mouth daily.    . predniSONE (DELTASONE) 20 MG tablet Take 3 tablets (60 mg total) by mouth daily with breakfast. 90 tablet 0   No facility-administered medications prior to visit.      Review of Systems:   Constitutional:   No  weight loss, night sweats,  Fevers, chills, + fatigue, or  lassitude.  HEENT:   No  headaches,  Difficulty swallowing,  Tooth/dental problems, or  Sore throat,                No sneezing, itching, ear ache, nasal congestion, post nasal drip,   CV:  No chest pain,  Orthopnea, PND, swelling in lower extremities, anasarca, dizziness, palpitations, syncope.   GI  No heartburn, indigestion, abdominal pain, nausea, vomiting, diarrhea, change in bowel habits, loss of appetite, bloody stools.   Resp:  .  No chest wall deformity  Skin: no rash or lesions.  GU: no dysuria, change in color of urine, no urgency or frequency.  No flank pain, no hematuria   MS:  No joint pain or swelling.  No decreased range of motion.  No back pain.    Physical Exam  BP 126/74 (BP Location: Left Arm, Cuff Size: Normal)   Pulse 72   LMP  (LMP Unknown)   SpO2 95%   GEN: A/Ox3; pleasant , NAD, elderly    HEENT:  [place]/AT,  EACs-clear, TMs-wnl, NOSE-clear, THROAT-clear, no lesions, no postnasal drip or exudate noted.   NECK:  Supple w/ fair ROM; no JVD; normal carotid impulses w/o bruits; no thyromegaly or nodules palpated; no lymphadenopathy.    RESP  Few trace rhonchi . no accessory muscle use, no dullness to percussion  CARD:  RRR, no m/r/g, no peripheral edema, pulses intact, no cyanosis or clubbing.  GI:   Soft & nt; nml bowel sounds; no organomegaly or masses detected.   Musco: Warm bil, no deformities or joint swelling noted.   Neuro: alert, no focal deficits noted.    Skin: Warm, no lesions or rashes    Lab Results:  CBC   BNP No results found for: BNP  ProBNP  Imaging: Dg Chest 2 View  Result Date: 08/01/2018 CLINICAL DATA:  Congestion and cough EXAM: CHEST - 2 VIEW COMPARISON:  02/20/2018 FINDINGS: Normal heart size. Lungs are hyperaerated and clear. No pneumothorax. No pleural effusion. Stable lower thoracic compression deformity. IMPRESSION: No active cardiopulmonary disease. Electronically Signed   By: Marybelle Killings M.D.   On: 08/01/2018 16:27     ipratropium-albuterol (DUONEB) 0.5-2.5 (3) MG/3ML nebulizer solution 3 mL    Date Action Dose Route User   07/21/2018 1646 Given 3 mL Nebulization Crews, Shannon R, LPN    methylPREDNISolone acetate (DEPO-MEDROL) injection 80 mg    Date Action Dose Route User   07/21/2018 1637 Given 80 mg Intramuscular (Right Ventrogluteal) Launa Grill, LPN      No flowsheet data found.  No results found for: NITRICOXIDE      Assessment & Plan:   COPD (chronic obstructive pulmonary disease) ([place]) Recurrent exacerbation-slow to resolve We will continue on a very slow steroid taper and hold at 10 mg.  Check sputum culture Chest x-ray today shows no acute process  Plan  Patient  Instructions  Taper Prednisone as directed to 10mg  daily - hold at this dose .  Robitussin DM Twice daily As needed  Cough/congestion  Continue on TRELEGY 1 puff daily , rinse after use.  Continue on Oxygen 2lm  Sputum culture .  Chest xray today  Follow up in 4-6 weeks and As needed  With Dr. Lamonte Sakai  Or Arielle Eber NP  Please contact office for sooner follow up if symptoms do not improve or worsen or seek emergency care       Chronic respiratory failure with hypoxia ([place]) Continue on oxygen 2 L with activity as needed and at bedtime     Rexene Edison, NP 08/01/2018

## 2018-08-01 NOTE — Patient Instructions (Addendum)
Taper Prednisone as directed to 10mg  daily - hold at this dose .  Robitussin DM Twice daily As needed  Cough/congestion  Continue on TRELEGY 1 puff daily , rinse after use.  Continue on Oxygen 2lm  Sputum culture .  Chest xray today  Follow up in 4-6 weeks and As needed  With Dr. Lamonte Sakai  Or [place] NP  Please contact office for sooner follow up if symptoms do not improve or worsen or seek emergency care

## 2018-08-01 NOTE — Assessment & Plan Note (Signed)
Continue on oxygen 2 L with activity as needed and at bedtime

## 2018-08-02 ENCOUNTER — Other Ambulatory Visit: Payer: Medicare Other

## 2018-08-02 DIAGNOSIS — J441 Chronic obstructive pulmonary disease with (acute) exacerbation: Secondary | ICD-10-CM

## 2018-08-04 ENCOUNTER — Telehealth: Payer: Self-pay | Admitting: Adult Health

## 2018-08-04 NOTE — Telephone Encounter (Signed)
Called and spoke with Patient.  Sputum culture was done 08/02/18. Explained that it takes longer to get results from a culture.  Explained that someone would call once results were available.  Understanding stated. Nothing further at this time.

## 2018-08-14 LAB — FUNGAL ISOLATE IDENTIFICATION
MICRO NUMBER: 238983
Specimen Quality:: ADEQUATE

## 2018-08-14 LAB — TEST AUTHORIZATION

## 2018-08-14 LAB — RESPIRATORY CULTURE OR RESPIRATORY AND SPUTUM CULTURE
MICRO NUMBER:: 215352
SPECIMEN QUALITY:: ADEQUATE

## 2018-08-23 NOTE — Progress Notes (Signed)
LMOMTCB x 1 

## 2018-08-24 ENCOUNTER — Telehealth: Payer: Self-pay | Admitting: Adult Health

## 2018-08-24 NOTE — Telephone Encounter (Signed)
[place] x 1 ( Per TP pt needs appt next week to discuss respiratory results - This will replace 09/12/18 appt)

## 2018-08-25 NOTE — Telephone Encounter (Signed)
Spoke with patient. She has been scheduled to see TP on 08/30/17 at 230 (this was the earliest that patient was willing to come in). Verbalized understanding of appt.   Nothing further needed at time of call.

## 2018-08-28 ENCOUNTER — Telehealth: Payer: Self-pay | Admitting: Cardiology

## 2018-08-28 NOTE — Telephone Encounter (Signed)
°*  STAT* If patient is at the pharmacy, call can be transferred to refill team.   1. Which medications need to be refilled? (please list name of each medication and dose if known)  [place] XT 2. Which pharmacy/location (including street and city if local pharmacy) is medication to be sent to?[facility]- [phone]  3. Do they need a 30 day or 90 day supply? 90 and refills

## 2018-08-31 ENCOUNTER — Ambulatory Visit: Payer: Medicare Other | Admitting: Adult Health

## 2018-09-12 ENCOUNTER — Ambulatory Visit: Payer: Medicare Other | Admitting: Adult Health

## 2018-10-09 ENCOUNTER — Other Ambulatory Visit: Payer: Self-pay

## 2018-10-09 ENCOUNTER — Ambulatory Visit (INDEPENDENT_AMBULATORY_CARE_PROVIDER_SITE_OTHER): Payer: Medicare Other | Admitting: Family Medicine

## 2018-10-09 ENCOUNTER — Encounter: Payer: Self-pay | Admitting: Family Medicine

## 2018-10-09 DIAGNOSIS — J441 Chronic obstructive pulmonary disease with (acute) exacerbation: Secondary | ICD-10-CM

## 2018-10-09 MED ORDER — ALBUTEROL SULFATE HFA 108 (90 BASE) MCG/ACT IN AERS: 2.0000 | INHALATION_SPRAY | RESPIRATORY_TRACT | 5 refills | Status: DC | PRN

## 2018-10-09 MED ORDER — AZELASTINE-FLUTICASONE 137-50 MCG/ACT NA SUSP: 2.0000 | Freq: Every day | NASAL | 5 refills | Status: DC

## 2018-10-09 MED ORDER — DOXYCYCLINE HYCLATE 100 MG PO TABS: 100.0000 mg | ORAL_TABLET | Freq: Two times a day (BID) | ORAL | 0 refills | Status: DC

## 2018-10-09 NOTE — Progress Notes (Signed)
Subjective:    Patient ID: Diane Patterson, female    DOB: [birth year], 73 y.o.   MRN: [id number]  HPI The patient symptoms began Saturday evening and worsened yesterday.  She reports worsening cough, worsening chest congestion, wheezing, and increasing shortness of breath.  Her cough is also become productive of grayish-yellow sputum which typically it is not.  She independently started taking 40 mg of prednisone yesterday and notices some improvement this morning.  She has been using her albuterol every 6 hours in addition to her Symbicort.  She denies any fever.  She denies any chills.  She denies any chest pain.  She denies any pleurisy.  There is no respiratory distress.  There is no increased work of breathing however her lung sounds are diminished dramatically bilaterally with decreased airflow and prolonged expiratory phase and expiratory wheezing is appreciated in all 4 lung fields Past Medical History:  Diagnosis Date  . Asthma   . Chronic airflow obstruction (HCC)   . Hypertension   . Tobacco abuse   . Venous insufficiency    Past Surgical History:  Procedure Laterality Date  . cataract    . NECK SURGERY    . no colonoscopy     "afraid to "; [facility] reviewed  . VESICOVAGINAL FISTULA CLOSURE W/ TAH     Current Outpatient Medications on File Prior to Visit  Medication Sig Dispense Refill  . acetaminophen (TYLENOL) 325 MG tablet Take 650 mg by mouth every 6 (six) hours as needed for mild pain.     Marland Kitchen albuterol (PROVENTIL) (2.5 MG/3ML) 0.083% nebulizer solution Take 3 mLs (2.5 mg total) by nebulization every 6 (six) hours as needed for wheezing or shortness of breath. 150 mL 1  . apixaban (ELIQUIS) 5 MG TABS tablet Take 1 tablet (5 mg total) by mouth 2 (two) times daily. 60 tablet 11  . budesonide-formoterol (SYMBICORT) 160-4.5 MCG/ACT inhaler INHALE 2 PUFFS INTO LUNGS TWICE DAILY 10.2 Inhaler 2  . diltiazem (CARTIA XT) 240 MG 24 hr capsule Take 1 capsule (240 mg total) by mouth  daily. 90 capsule 2  . hydrochlorothiazide (MICROZIDE) 12.5 MG capsule Take 2 capsules (25 mg total) by mouth daily. 60 capsule 11  . ipratropium-albuterol (DUONEB) 0.5-2.5 (3) MG/3ML SOLN Take 3 mLs by nebulization 2 (two) times daily as needed (wheeze and shortness of breath). 360 mL 0  . potassium chloride SA (K-DUR,KLOR-CON) 20 MEQ tablet Take 20 mEq by mouth daily.    . predniSONE (DELTASONE) 20 MG tablet Take 3 tablets (60 mg total) by mouth daily with breakfast. (Patient taking differently: Take 40 mg by mouth daily with breakfast. ) 90 tablet 0  . benzonatate (TESSALON) 100 MG capsule Take 1 capsule (100 mg total) by mouth 3 (three) times daily as needed for cough. (Patient not taking: Reported on 07/27/2018) 30 capsule 0   No current facility-administered medications on file prior to visit.    No Known Allergies Social History   Socioeconomic History  . Marital status: Married    Spouse name: Not on file  . Number of children: 1  . Years of education: Not on file  . Highest education level: Not on file  Occupational History  . Not on file  Social Needs  . Financial resource strain: Not on file  . Food insecurity:    Worry: Not on file    Inability: Not on file  . Transportation needs:    Medical: Not on file    Non-medical: Not on  file  Tobacco Use  . Smoking status: Former Smoker    Packs/day: 0.75    Years: 15.00    Pack years: 11.25    Types: Cigarettes    Last attempt to quit: 03/20/2014    Years since quitting: 4.5  . Smokeless tobacco: Former Systems developer    Quit date: 03/11/2014  Substance and Sexual Activity  . Alcohol use: No    Alcohol/week: 0.0 standard drinks  . Drug use: No  . Sexual activity: Not on file  Lifestyle  . Physical activity:    Days per week: Not on file    Minutes per session: Not on file  . Stress: Not on file  Relationships  . Social connections:    Talks on phone: Not on file    Gets together: Not on file    Attends religious service:  Not on file    Active member of club or organization: Not on file    Attends meetings of clubs or organizations: Not on file    Relationship status: Not on file  . Intimate partner violence:    Fear of current or ex partner: Not on file    Emotionally abused: Not on file    Physically abused: Not on file    Forced sexual activity: Not on file  Other Topics Concern  . Not on file  Social History Narrative  . Not on file      Review of Systems  All other systems reviewed and are negative.      Objective:   Physical Exam Vitals signs reviewed.  Constitutional:      Appearance: She is well-developed.  HENT:     Nose: Nose normal.     Mouth/Throat:     Pharynx: No oropharyngeal exudate.  Neck:     Vascular: No JVD.  Cardiovascular:     Rate and Rhythm: Normal rate and regular rhythm.     Heart sounds: Normal heart sounds.  Pulmonary:     Effort: Pulmonary effort is normal. No respiratory distress.     Breath sounds: Decreased breath sounds and wheezing present. No rales.  Lymphadenopathy:     Cervical: No cervical adenopathy.           Assessment & Plan:  COPD exacerbation ([place]) - Plan: albuterol (VENTOLIN HFA) 108 (90 Base) MCG/ACT inhaler  I believe the patient has a COPD exacerbation.  Patient received 80 mg of Depo-Medrol IM x1 here today.  She will start 60 mg of prednisone tomorrow orally and then follow-up here on Friday for reassessment.  I also started doxycycline 100 mg p.o. twice daily for 10 days and recommended she use albuterol 2 puffs inhaled every 6 hours until better.  Continue her current Symbicort

## 2018-10-13 ENCOUNTER — Other Ambulatory Visit: Payer: Self-pay

## 2018-10-13 ENCOUNTER — Ambulatory Visit (INDEPENDENT_AMBULATORY_CARE_PROVIDER_SITE_OTHER): Payer: Medicare Other | Admitting: Family Medicine

## 2018-10-13 VITALS — BP 180/92 | HR 72 | Temp 97.5°F | Resp 16 | Ht 67.0 in | Wt 130.0 lb

## 2018-10-13 DIAGNOSIS — I10 Essential (primary) hypertension: Secondary | ICD-10-CM | POA: Diagnosis not present

## 2018-10-13 DIAGNOSIS — J441 Chronic obstructive pulmonary disease with (acute) exacerbation: Secondary | ICD-10-CM

## 2018-10-13 MED ORDER — LOSARTAN POTASSIUM 50 MG PO TABS: 50.0000 mg | ORAL_TABLET | Freq: Every day | ORAL | 3 refills | Status: DC

## 2018-10-13 MED ORDER — PREDNISONE 20 MG PO TABS: 60.0000 mg | ORAL_TABLET | Freq: Every day | ORAL | 0 refills | Status: DC

## 2018-10-13 MED ORDER — BUDESONIDE-FORMOTEROL FUMARATE 160-4.5 MCG/ACT IN AERO: INHALATION_SPRAY | RESPIRATORY_TRACT | 2 refills | Status: DC

## 2018-10-13 NOTE — Progress Notes (Signed)
Subjective:    Patient ID: CLASSIE WENG, female    DOB: 10/05/1945, 73 y.o.   MRN: [id number]  HPI  10/09/18 The patient symptoms began Saturday evening and worsened yesterday.  She reports worsening cough, worsening chest congestion, wheezing, and increasing shortness of breath.  Her cough is also become productive of grayish-yellow sputum which typically it is not.  She independently started taking 40 mg of prednisone yesterday and notices some improvement this morning.  She has been using her albuterol every 6 hours in addition to her Symbicort.  She denies any fever.  She denies any chills.  She denies any chest pain.  She denies any pleurisy.  There is no respiratory distress.  There is no increased work of breathing however her lung sounds are diminished dramatically bilaterally with decreased airflow and prolonged expiratory phase and expiratory wheezing is appreciated in all 4 lung fields.  At that time, my plan was: I believe the patient has a COPD exacerbation.  Patient received 80 mg of Depo-Medrol IM x1 here today.  She will start 60 mg of prednisone tomorrow orally and then follow-up here on Friday for reassessment.  I also started doxycycline 100 mg p.o. twice daily for 10 days and recommended she use albuterol 2 puffs inhaled every 6 hours until better.  Continue her current Symbicort  10/13/18 Patient's breathing has improved substantially since when I saw her earlier this week.  She is still on 60 mg a day of prednisone.  However her wheezing has improved.  The chest congestion has symptomatically improved.  Her cough is nonproductive.  She denies any fevers or chills.  She denies any purulent sputum.  She denies any hemoptysis.  She denies any chest pain.  However her blood pressure is elevated at 180/92.  2 out of the last 3 office visits the patient has had at my office her blood pressure has been elevated despite taking hydrochlorothiazide.  I confirmed her blood pressure today  personally and found it to be similar at 178/90 Past Medical History:  Diagnosis Date  . Asthma   . Chronic airflow obstruction (HCC)   . Hypertension   . Tobacco abuse   . Venous insufficiency    Past Surgical History:  Procedure Laterality Date  . cataract    . NECK SURGERY    . no colonoscopy     "afraid to "; [facility] reviewed  . VESICOVAGINAL FISTULA CLOSURE W/ TAH     Current Outpatient Medications on File Prior to Visit  Medication Sig Dispense Refill  . acetaminophen (TYLENOL) 325 MG tablet Take 650 mg by mouth every 6 (six) hours as needed for mild pain.     Marland Kitchen albuterol (PROVENTIL) (2.5 MG/3ML) 0.083% nebulizer solution Take 3 mLs (2.5 mg total) by nebulization every 6 (six) hours as needed for wheezing or shortness of breath. 150 mL 1  . albuterol (VENTOLIN HFA) 108 (90 Base) MCG/ACT inhaler Inhale 2 puffs into the lungs every 4 (four) hours as needed for wheezing or shortness of breath. 1 Inhaler 5  . apixaban (ELIQUIS) 5 MG TABS tablet Take 1 tablet (5 mg total) by mouth 2 (two) times daily. 60 tablet 11  . Azelastine-Fluticasone (DYMISTA) 137-50 MCG/ACT SUSP Place 2 sprays into the nose daily. 1 Bottle 5  . benzonatate (TESSALON) 100 MG capsule Take 1 capsule (100 mg total) by mouth 3 (three) times daily as needed for cough. (Patient not taking: Reported on 07/27/2018) 30 capsule 0  . budesonide-formoterol (SYMBICORT)  160-4.5 MCG/ACT inhaler INHALE 2 PUFFS INTO LUNGS TWICE DAILY 10.2 Inhaler 2  . diltiazem (CARTIA XT) 240 MG 24 hr capsule Take 1 capsule (240 mg total) by mouth daily. 90 capsule 2  . doxycycline (VIBRA-TABS) 100 MG tablet Take 1 tablet (100 mg total) by mouth 2 (two) times daily. 20 tablet 0  . hydrochlorothiazide (MICROZIDE) 12.5 MG capsule Take 2 capsules (25 mg total) by mouth daily. 60 capsule 11  . ipratropium-albuterol (DUONEB) 0.5-2.5 (3) MG/3ML SOLN Take 3 mLs by nebulization 2 (two) times daily as needed (wheeze and shortness of breath). 360 mL 0  .  potassium chloride SA (K-DUR,KLOR-CON) 20 MEQ tablet Take 20 mEq by mouth daily.    . predniSONE (DELTASONE) 20 MG tablet Take 3 tablets (60 mg total) by mouth daily with breakfast. (Patient taking differently: Take 40 mg by mouth daily with breakfast. ) 90 tablet 0   No current facility-administered medications on file prior to visit.    No Known Allergies Social History   Socioeconomic History  . Marital status: Married    Spouse name: Not on file  . Number of children: 1  . Years of education: Not on file  . Highest education level: Not on file  Occupational History  . Not on file  Social Needs  . Financial resource strain: Not on file  . Food insecurity:    Worry: Not on file    Inability: Not on file  . Transportation needs:    Medical: Not on file    Non-medical: Not on file  Tobacco Use  . Smoking status: Former Smoker    Packs/day: 0.75    Years: 15.00    Pack years: 11.25    Types: Cigarettes    Last attempt to quit: 03/20/2014    Years since quitting: 4.5  . Smokeless tobacco: Former Systems developer    Quit date: 03/11/2014  Substance and Sexual Activity  . Alcohol use: No    Alcohol/week: 0.0 standard drinks  . Drug use: No  . Sexual activity: Not on file  Lifestyle  . Physical activity:    Days per week: Not on file    Minutes per session: Not on file  . Stress: Not on file  Relationships  . Social connections:    Talks on phone: Not on file    Gets together: Not on file    Attends religious service: Not on file    Active member of club or organization: Not on file    Attends meetings of clubs or organizations: Not on file    Relationship status: Not on file  . Intimate partner violence:    Fear of current or ex partner: Not on file    Emotionally abused: Not on file    Physically abused: Not on file    Forced sexual activity: Not on file  Other Topics Concern  . Not on file  Social History Narrative  . Not on file      Review of Systems  All other  systems reviewed and are negative.      Objective:   Physical Exam Vitals signs reviewed.  Constitutional:      Appearance: She is well-developed.  HENT:     Nose: Nose normal.     Mouth/Throat:     Pharynx: No oropharyngeal exudate.  Neck:     Vascular: No JVD.  Cardiovascular:     Rate and Rhythm: Normal rate. Rhythm irregular.     Heart sounds: Normal heart  sounds.  Pulmonary:     Effort: Pulmonary effort is normal. No respiratory distress.     Breath sounds: Decreased breath sounds present. No wheezing or rales.  Lymphadenopathy:     Cervical: No cervical adenopathy.    Patient's wheezing has subsided.  She still has diminished breath sounds bilaterally however her airflow has improved       Assessment & Plan:  Essential hypertension  COPD exacerbation ([place]) - Plan: budesonide-formoterol (SYMBICORT) 160-4.5 MCG/ACT inhaler  Blood pressure is elevated.  I will add losartan 50 mg a day to her hydrochlorothiazide and recheck her blood pressure at her next office visit.  Wean down on prednisone.  Decrease prednisone to 40 mg a day for 3 days and then 20 mg a day for 3 days and then discontinue.  I will send the patient a prescription for prednisone tablets.  To have tablets of prednisone on hand so that she can start prednisone therapy immediately if she develops an exacerbation.  Therefore I will send her a bottle of 20 mg prednisone pills to have on hand at home but I cautioned the patient not to start them unless she is developing an attack.  Patient is very familiar with the symptoms and knows how and when to begin prednisone.  She will not take them daily.

## 2018-10-20 ENCOUNTER — Telehealth: Payer: Self-pay | Admitting: Family Medicine

## 2018-10-20 NOTE — Telephone Encounter (Signed)
Patient came in today stating that she is has been feeling a "little woozy" since starting her losartan Friday. She states that the feeling resolves when she is sitting. Advised patient it may be orthostatic blood pressures and ask patient if she had any readings from this past week she stated that she has but she can not remember what they were but they are better. Per appointment desk patient has an appointment on Monday. I advised patient if symptoms worsen this weekend to got to the ER. Patient verbalized understanding

## 2018-10-23 ENCOUNTER — Ambulatory Visit (INDEPENDENT_AMBULATORY_CARE_PROVIDER_SITE_OTHER): Payer: Medicare Other | Admitting: Family Medicine

## 2018-10-23 ENCOUNTER — Telehealth: Payer: Self-pay | Admitting: Cardiology

## 2018-10-23 ENCOUNTER — Other Ambulatory Visit: Payer: Self-pay

## 2018-10-23 ENCOUNTER — Encounter: Payer: Self-pay | Admitting: Family Medicine

## 2018-10-23 VITALS — BP 120/64 | HR 78 | Temp 98.8°F | Resp 18 | Ht 67.0 in | Wt 125.0 lb

## 2018-10-23 DIAGNOSIS — R55 Syncope and collapse: Secondary | ICD-10-CM

## 2018-10-23 DIAGNOSIS — E119 Type 2 diabetes mellitus without complications: Secondary | ICD-10-CM

## 2018-10-23 DIAGNOSIS — I10 Essential (primary) hypertension: Secondary | ICD-10-CM | POA: Diagnosis not present

## 2018-10-23 NOTE — Telephone Encounter (Signed)
New Message   Pt c/o BP issue: STAT if pt c/o blurred vision, one-sided weakness or slurred speech  1. What are your last 5 BP readings? Patient hasn't taking any bp readings, the ambulance came Saturday and told her to contact her heart doctor.  2. Are you having any other symptoms (ex. Dizziness, headache, blurred vision, passed out)? When walking she's dizzy and very weak  3. What is your BP issue? Patient states the ambulance came Saturday and told her to call her heart doctor.  Patient would like to speak to a nurse about issue.

## 2018-10-23 NOTE — Telephone Encounter (Signed)
Called and spoke to patient. Patient's husband has an echo appointment at 12:15 PM. Appointment made for patient to see Dr. Marlou Porch in the office at 11:40 AM tomorrow. Screening questions done below. Patient will wear a mask when she comes.      COVID-19 Pre-Screening Questions:  . How are you feeling today, any cough,  shortness of breath, headache, congestion, fever, body aches, chills, sore throat, or sudden loss of taste or sense of smell? NO . Have you been around anyone with known Covid 19? NO . Have you been around anyone who is awaiting Covid 19 test results? NO . Have you been around anyone who has been exposed to Covid 19, or has mentioned symptoms of Covid 19 within the past 14 days,?NO  If you have any concerns about symptoms your patients report please contact your leadership team, or the provider the patient is seeing in the office for further guidance.

## 2018-10-23 NOTE — Telephone Encounter (Signed)
Pt reports being dizzy on Saturday and calling the ambulance, states EMS said her BP was "really low" but does not know the actual reading. Pt refused to go to hospital d/t COVID-19. EMS advised pt to call her cardiologist for an appointment. Pt reports feeling weak but denies fever, swelling, diaphoresis or chest pain at this time. Pt has not been dizzy today but would like to see Dr. Marlou Porch in office as soon as possible. Pt says her spouse has an appt with [facility] tomorrow and would like to be seen tomorrow as well if possible. Advised pt I would check with [facility] and RN about appt. Pt does not have access to checking her BP at home.

## 2018-10-23 NOTE — Telephone Encounter (Signed)
Go ahead and put her back to back with her spouse.  I will see her tomorrow. Candee Furbish, MD

## 2018-10-23 NOTE — Progress Notes (Signed)
Subjective:    Patient ID: Diane Patterson, female    DOB: [birth year], 73 y.o.   MRN: [id number]  HPI  10/09/18 The patient symptoms began Saturday evening and worsened yesterday.  She reports worsening cough, worsening chest congestion, wheezing, and increasing shortness of breath.  Her cough is also become productive of grayish-yellow sputum which typically it is not.  She independently started taking 40 mg of prednisone yesterday and notices some improvement this morning.  She has been using her albuterol every 6 hours in addition to her Symbicort.  She denies any fever.  She denies any chills.  She denies any chest pain.  She denies any pleurisy.  There is no respiratory distress.  There is no increased work of breathing however her lung sounds are diminished dramatically bilaterally with decreased airflow and prolonged expiratory phase and expiratory wheezing is appreciated in all 4 lung fields.  At that time, my plan was: I believe the patient has a COPD exacerbation.  Patient received 80 mg of Depo-Medrol IM x1 here today.  She will start 60 mg of prednisone tomorrow orally and then follow-up here on Friday for reassessment.  I also started doxycycline 100 mg p.o. twice daily for 10 days and recommended she use albuterol 2 puffs inhaled every 6 hours until better.  Continue her current Symbicort  10/13/18 Patient's breathing has improved substantially since when I saw her earlier this week.  She is still on 60 mg a day of prednisone.  However her wheezing has improved.  The chest congestion has symptomatically improved.  Her cough is nonproductive.  She denies any fevers or chills.  She denies any purulent sputum.  She denies any hemoptysis.  She denies any chest pain.  However her blood pressure is elevated at 180/92.  2 out of the last 3 office visits the patient has had at my office her blood pressure has been elevated despite taking hydrochlorothiazide.  I confirmed her blood pressure today  personally and found it to be similar at 178/90.  At that time, my plan was: Blood pressure is elevated.  I will add losartan 50 mg a day to her hydrochlorothiazide and recheck her blood pressure at her next office visit.  Wean down on prednisone.  Decrease prednisone to 40 mg a day for 3 days and then 20 mg a day for 3 days and then discontinue.  I will send the patient a prescription for prednisone tablets.  To have tablets of prednisone on hand so that she can start prednisone therapy immediately if she develops an exacerbation.  Therefore I will send her a bottle of 20 mg prednisone pills to have on hand at home but I cautioned the patient not to start them unless she is developing an attack.  Patient is very familiar with the symptoms and knows how and when to begin prednisone.  She will not take them daily.  10/23/18 At the last visit, I started the patient on losartan 50 mg a day due to her elevated blood pressure.  Patient states that Friday, she felt extremely weak like she was going to pass out.  She was also having difficulty breathing.  She denies any chest pain.  EMS was contacted.  At that time her blood pressure was extremely low.  She discontinued her losartan and her blood pressure has improved.  Since that time she has felt better.  However she states that she has been having the spells of feeling extremely weak and lightheaded  like she may pass out more frequently over the last few weeks.  She denies any palpitations.  She denies any chest pain.  She has shortness of breath at rest anyway based on her severe oxygen dependent COPD however she denies any changes in her shortness of breath.  She denies any tachycardia that she can feel or notice however she has a history of A. fib sees Dr. Marlou Porch.  She had an echocardiogram last year in July that showed an ejection fraction of 65 to 70%.  She does have a history of prediabetes.  Her last hemoglobin A1c was 6.2 in December.  She is not currently on  any hypoglycemic agent that should cause lightheadedness or dizziness however she is not checking her blood sugar and therefore hyperglycemia is certainly a possibility given her recent use of prednisone for COPD. Past Medical History:  Diagnosis Date  . Asthma   . Chronic airflow obstruction (HCC)   . Hypertension   . Tobacco abuse   . Venous insufficiency    Past Surgical History:  Procedure Laterality Date  . cataract    . NECK SURGERY    . no colonoscopy     "afraid to "; [facility] reviewed  . VESICOVAGINAL FISTULA CLOSURE W/ TAH     Current Outpatient Medications on File Prior to Visit  Medication Sig Dispense Refill  . acetaminophen (TYLENOL) 325 MG tablet Take 650 mg by mouth every 6 (six) hours as needed for mild pain.     Marland Kitchen albuterol (PROVENTIL) (2.5 MG/3ML) 0.083% nebulizer solution Take 3 mLs (2.5 mg total) by nebulization every 6 (six) hours as needed for wheezing or shortness of breath. 150 mL 1  . albuterol (VENTOLIN HFA) 108 (90 Base) MCG/ACT inhaler Inhale 2 puffs into the lungs every 4 (four) hours as needed for wheezing or shortness of breath. 1 Inhaler 5  . apixaban (ELIQUIS) 5 MG TABS tablet Take 1 tablet (5 mg total) by mouth 2 (two) times daily. 60 tablet 11  . Azelastine-Fluticasone (DYMISTA) 137-50 MCG/ACT SUSP Place 2 sprays into the nose daily. 1 Bottle 5  . budesonide-formoterol (SYMBICORT) 160-4.5 MCG/ACT inhaler INHALE 2 PUFFS INTO LUNGS TWICE DAILY 10.2 Inhaler 2  . diltiazem (CARTIA XT) 240 MG 24 hr capsule Take 1 capsule (240 mg total) by mouth daily. 90 capsule 2  . doxycycline (VIBRA-TABS) 100 MG tablet Take 1 tablet (100 mg total) by mouth 2 (two) times daily. 20 tablet 0  . hydrochlorothiazide (MICROZIDE) 12.5 MG capsule Take 2 capsules (25 mg total) by mouth daily. 60 capsule 11  . ipratropium-albuterol (DUONEB) 0.5-2.5 (3) MG/3ML SOLN Take 3 mLs by nebulization 2 (two) times daily as needed (wheeze and shortness of breath). 360 mL 0  . losartan (COZAAR) 50  MG tablet Take 1 tablet (50 mg total) by mouth daily. 90 tablet 3  . potassium chloride SA (K-DUR,KLOR-CON) 20 MEQ tablet Take 20 mEq by mouth daily.     No current facility-administered medications on file prior to visit.    No Known Allergies Social History   Socioeconomic History  . Marital status: Married    Spouse name: Not on file  . Number of children: 1  . Years of education: Not on file  . Highest education level: Not on file  Occupational History  . Not on file  Social Needs  . Financial resource strain: Not on file  . Food insecurity:    Worry: Not on file    Inability: Not on file  .  Transportation needs:    Medical: Not on file    Non-medical: Not on file  Tobacco Use  . Smoking status: Former Smoker    Packs/day: 0.75    Years: 15.00    Pack years: 11.25    Types: Cigarettes    Last attempt to quit: 03/20/2014    Years since quitting: 4.5  . Smokeless tobacco: Former Systems developer    Quit date: 03/11/2014  Substance and Sexual Activity  . Alcohol use: No    Alcohol/week: 0.0 standard drinks  . Drug use: No  . Sexual activity: Not on file  Lifestyle  . Physical activity:    Days per week: Not on file    Minutes per session: Not on file  . Stress: Not on file  Relationships  . Social connections:    Talks on phone: Not on file    Gets together: Not on file    Attends religious service: Not on file    Active member of club or organization: Not on file    Attends meetings of clubs or organizations: Not on file    Relationship status: Not on file  . Intimate partner violence:    Fear of current or ex partner: Not on file    Emotionally abused: Not on file    Physically abused: Not on file    Forced sexual activity: Not on file  Other Topics Concern  . Not on file  Social History Narrative  . Not on file      Review of Systems  All other systems reviewed and are negative.      Objective:   Physical Exam Vitals signs reviewed.  Constitutional:       Appearance: She is well-developed.  HENT:     Nose: Nose normal.     Mouth/Throat:     Pharynx: No oropharyngeal exudate.  Neck:     Vascular: No JVD.  Cardiovascular:     Rate and Rhythm: Normal rate. Rhythm irregular.     Heart sounds: Normal heart sounds.  Pulmonary:     Effort: Pulmonary effort is normal. No respiratory distress.     Breath sounds: Decreased breath sounds present. No wheezing or rales.  Abdominal:     General: Bowel sounds are normal.     Palpations: Abdomen is soft.  Musculoskeletal:     Right lower leg: No edema.     Left lower leg: No edema.  Lymphadenopathy:     Cervical: No cervical adenopathy.          Assessment & Plan:  Type 2 diabetes mellitus without complication, without long-term current use of insulin (HCC) - Plan: Hemoglobin A1c, CBC with Differential/Platelet, COMPLETE METABOLIC PANEL WITH GFR, Lipid panel, Microalbumin, urine  Essential hypertension  Near syncope - Plan: Cardiac event monitor  I am concerned that her near syncope could either be due to cardiac arrhythmia or possibly hypotension secondary to medication.  Therefore I recommended that she discontinue losartan completely.  Even though her blood pressure here was extremely high previously it is now well controlled today at 120/64.  I asked her to buy blood pressure cuff and start checking her blood pressure 3 times a day at home and noting the values so that we can see if her blood pressures fluctuating at home.  I also asked her to check her blood pressure whenever she feels weak or lightheaded to see if low blood pressure could be the cause of this.  I am also concerned that  her A. fib may be the root cause of her near syncope.  Is possible the patient may be having episodic bradycardia or possibly tachycardia causing her to feel lightheaded due to cerebral hypoperfusion.  Therefore I will have our scheduler contact her cardiologist office and see if we can get her scheduled for an  event monitor soon as possible.  Also given her history of prediabetes I will check a hemoglobin A1c as well as a CBC a CMP and a fasting lipid panel while drawing lab work.  However I believe this is much less likely the cause of her near syncope.  I am more concerned about cerebral hypoperfusion due to cardiac arrhythmia or hypotension.  Hopefully if it was due to her medications stopping the losartan will prevent the hypotension from happening again.  I have asked the patient to continue wearing her oxygen constantly until we get to the bottom of this as it is possible hypoxia may potentially cause her lightheadedness as well.  Her oxygen today is 99% on 2 L

## 2018-10-24 ENCOUNTER — Telehealth: Payer: Self-pay | Admitting: Radiology

## 2018-10-24 ENCOUNTER — Ambulatory Visit (INDEPENDENT_AMBULATORY_CARE_PROVIDER_SITE_OTHER): Payer: Medicare Other | Admitting: Cardiology

## 2018-10-24 ENCOUNTER — Encounter: Payer: Self-pay | Admitting: Cardiology

## 2018-10-24 ENCOUNTER — Other Ambulatory Visit: Payer: Medicare Other

## 2018-10-24 VITALS — BP 160/80 | HR 62 | Ht 67.0 in | Wt 124.4 lb

## 2018-10-24 DIAGNOSIS — I480 Paroxysmal atrial fibrillation: Secondary | ICD-10-CM | POA: Diagnosis not present

## 2018-10-24 DIAGNOSIS — I10 Essential (primary) hypertension: Secondary | ICD-10-CM | POA: Diagnosis not present

## 2018-10-24 DIAGNOSIS — R55 Syncope and collapse: Secondary | ICD-10-CM

## 2018-10-24 NOTE — Progress Notes (Signed)
Cardiology Office Note:    Date:  10/24/2018   ID:  Diane Patterson, DOB 12-07-1945, MRN [id number]  PCP:  Diane Frizzle, MD  Cardiologist:  Candee Furbish, MD  Electrophysiologist:  None   Referring MD: Diane Frizzle, MD   No chief complaint on file. Here for the evaluation of near syncope  History of Present Illness:    Diane Patterson is a 73 y.o. female with near syncopal episode.  In review of Dr. Samella Patterson note from 10/23/2018, yesterday, there was concern over possible cardiac arrhythmia or hypotension secondary to medication.  It was recommended that she discontinue her losartan completely.  She is going to buy blood pressure cuff and start checking her blood pressures.  If she feels weak or lightheaded check and see what her blood pressure is.  There was some concern that she may have transient bradycardia or tachycardia.  We have set her up for an event monitor.  Blood work was drawn and was reassuring. Just had come off ABX and prednisone.   She was having trouble with breathing, EMS came. BP was not right. No Fever. BP was low.   Week before, pain in stomach going up. Cough hard, chunk came up and fine. Felt wobbly with walking.  Requested an in office visit.  Creatinine 0.77 potassium 4.3 glucose 121 LDL 98 hemoglobin 11.6  Current EKG shows normal sinus rhythm 62 with nonspecific ST-T wave changes.  Prior EKG in 2017 showed similar findings.  Previous nuclear stress test and prior event monitor was reassuring.  Shortness of breath seem to be COPD related.  No evidence of ischemia.  Prior episode of atrial fibrillation with rapid ventricular response in February 2019.  Continuing with Eliquis.  Her husband had a previous bleed on Xarelto requiring 4 units of blood.  Being very careful with her hemoglobin monitoring.  Past Medical History:  Diagnosis Date  . Asthma   . Chronic airflow obstruction (HCC)   . Hypertension   . Tobacco abuse   . Venous  insufficiency     Past Surgical History:  Procedure Laterality Date  . cataract    . NECK SURGERY    . no colonoscopy     "afraid to "; [facility] reviewed  . VESICOVAGINAL FISTULA CLOSURE W/ TAH      Current Medications: Current Meds  Medication Sig  . acetaminophen (TYLENOL) 325 MG tablet Take 650 mg by mouth every 6 (six) hours as needed for mild pain.   Marland Kitchen albuterol (PROVENTIL) (2.5 MG/3ML) 0.083% nebulizer solution Take 3 mLs (2.5 mg total) by nebulization every 6 (six) hours as needed for wheezing or shortness of breath.  Marland Kitchen albuterol (VENTOLIN HFA) 108 (90 Base) MCG/ACT inhaler Inhale 2 puffs into the lungs every 4 (four) hours as needed for wheezing or shortness of breath.  Marland Kitchen apixaban (ELIQUIS) 5 MG TABS tablet Take 1 tablet (5 mg total) by mouth 2 (two) times daily.  . Azelastine-Fluticasone (DYMISTA) 137-50 MCG/ACT SUSP Place 2 sprays into the nose daily.  . budesonide-formoterol (SYMBICORT) 160-4.5 MCG/ACT inhaler INHALE 2 PUFFS INTO LUNGS TWICE DAILY  . diltiazem (CARTIA XT) 240 MG 24 hr capsule Take 1 capsule (240 mg total) by mouth daily.  . hydrochlorothiazide (MICROZIDE) 12.5 MG capsule Take 2 capsules (25 mg total) by mouth daily.  Marland Kitchen ipratropium-albuterol (DUONEB) 0.5-2.5 (3) MG/3ML SOLN Take 3 mLs by nebulization 2 (two) times daily as needed (wheeze and shortness of breath).  . potassium chloride SA (K-DUR,KLOR-CON) 20 MEQ  tablet Take 20 mEq by mouth daily.     Allergies:   Patient has no known allergies.   Social History   Socioeconomic History  . Marital status: Married    Spouse name: Not on file  . Number of children: 1  . Years of education: Not on file  . Highest education level: Not on file  Occupational History  . Not on file  Social Needs  . Financial resource strain: Not on file  . Food insecurity:    Worry: Not on file    Inability: Not on file  . Transportation needs:    Medical: Not on file    Non-medical: Not on file  Tobacco Use  . Smoking  status: Former Smoker    Packs/day: 0.75    Years: 15.00    Pack years: 11.25    Types: Cigarettes    Last attempt to quit: 03/20/2014    Years since quitting: 4.6  . Smokeless tobacco: Former Systems developer    Quit date: 03/11/2014  Substance and Sexual Activity  . Alcohol use: No    Alcohol/week: 0.0 standard drinks  . Drug use: No  . Sexual activity: Not on file  Lifestyle  . Physical activity:    Days per week: Not on file    Minutes per session: Not on file  . Stress: Not on file  Relationships  . Social connections:    Talks on phone: Not on file    Gets together: Not on file    Attends religious service: Not on file    Active member of club or organization: Not on file    Attends meetings of clubs or organizations: Not on file    Relationship status: Not on file  Other Topics Concern  . Not on file  Social History Narrative  . Not on file     Family History: The patient's family history includes Asthma in her father; Diabetes in her mother; Emphysema in her father; Heart disease in her father.  ROS:   Please see the history of present illness.    Denies any fevers chills nausea vomiting. All other systems reviewed and are negative.  EKGs/Labs/Other Studies Reviewed:    The following studies were reviewed today:  NUC stress: 08/12/15: 1. This study was count-poor at rest and with Lexiscan stress. There is a fixed, small, mild apical perfusion defect with normal wall motion that is likely artifact. No definite ischemia or infarction.  2. Normal EF and wall motion.  3. Low risk study.   Reassuring  Event monitor 08/12/15: No adverse rhythms.   EKG:  EKG is  ordered today.  The ekg ordered today demonstrates normal sinus rhythm 62 left anterior fascicular block nonspecific T wave changes.  No significant change from prior.  Recent Labs: 03/01/2018: TSH 1.26 10/23/2018: ALT 11; BUN 23; Creat 0.77; Hemoglobin 11.6; Platelets 321; Potassium 4.3; Sodium 140  Recent  Lipid Panel    Component Value Date/Time   CHOL 174 10/23/2018 1047   TRIG 123 10/23/2018 1047   HDL 76 10/23/2018 1047   CHOLHDL 2.3 10/23/2018 1047   VLDL 27.6 02/10/2010 1017   LDLCALC 78 10/23/2018 1047   LDLDIRECT 136.0 02/10/2010 1017    Physical Exam:    VS:  BP (!) 160/80   Pulse 62   Ht 5\' 7"  (1.702 m)   Wt 124 lb 6.4 oz (56.4 kg)   LMP  (LMP Unknown)   BMI 19.48 kg/m     Wt Readings  from Last 3 Encounters:  10/24/18 124 lb 6.4 oz (56.4 kg)  10/23/18 125 lb (56.7 kg)  10/13/18 130 lb (59 kg)     GEN: Thin in no acute distress HEENT: Normal NECK: No JVD; No carotid bruits LYMPHATICS: No lymphadenopathy CARDIAC: RRR, no murmurs, rubs, gallops RESPIRATORY:  Clear to auscultation without rales, wheezing or rhonchi  ABDOMEN: Soft, non-tender, non-distended MUSCULOSKELETAL:  No edema; No deformity  SKIN: Warm and dry NEUROLOGIC:  Alert and oriented x 3 PSYCHIATRIC:  Normal affect   ASSESSMENT:    1. Near syncope   2. Essential hypertension   3. PAF (paroxysmal atrial fibrillation) (HCC)    PLAN:    In order of problems listed above:  Near syncope - I agree with Dr. Dennard Schaumann.  Could be hypotension.  Agree with stopping the losartan. -Event monitor will be sent out as well to evaluate for any potential arrhythmia, bradycardia in the setting of atrial fibrillation. -She has had these episodes in the past.  I had thought previously that it was transient hypotension.  Willing to tolerate slightly higher blood pressure.  Paroxysmal atrial fibrillation -Had a prior episode in February 2019.  Continue with Eliquis.  She is currently in sinus rhythm.  Could this episode have been rapid atrial fibrillation, perhaps, however EMS did come to her house and listen to her while she was feeling weak and having trouble breathing and did not note any bradycardia or rapid heart rate at that time.  Maybe this was transient?.  Nonetheless, event monitor has been ordered.  COPD  -Dr. Lamonte Sakai has been monitoring.  Essential hypertension -Off of losartan now.  Blood pressure 160/80.  Willing to tolerate at this time given her near syncopal episodes.  High-level medical decision making.   Medication Adjustments/Labs and Tests Ordered: Current medicines are reviewed at length with the patient today.  Concerns regarding medicines are outlined above.  Orders Placed This Encounter  Procedures  . EKG 12-Lead   No orders of the defined types were placed in this encounter.   Patient Instructions  Medication Instructions:  The current medical regimen is effective;  continue present plan and medications.  If you need a refill on your cardiac medications before your next appointment, please call your pharmacy.   Follow-Up: At [facility], you and your health needs are our priority.  As part of our continuing mission to provide you with exceptional heart care, we have created designated Provider Care Teams.  These Care Teams include your primary Cardiologist (physician) and Advanced Practice Providers (APPs -  Physician Assistants and Nurse Practitioners) who all work together to provide you with the care you need, when you need it. You will need a follow up appointment in 6 months with Cecilie Kicks, NP and 1 year with Dr Marlou Porch.  Please call our office 2 months in advance to schedule this appointment.  You may see Candee Furbish, MD or one of the following Advanced Practice Providers on your designated Care Team:   Truitt Merle, NP Cecilie Kicks, NP . Kathyrn Drown, NP  Thank you for choosing [facility]!!        Signed, Candee Furbish, MD  10/24/2018 12:30 PM    [place]

## 2018-10-24 NOTE — Patient Instructions (Signed)
Medication Instructions:  The current medical regimen is effective;  continue present plan and medications.  If you need a refill on your cardiac medications before your next appointment, please call your pharmacy.   Follow-Up: At [facility], you and your health needs are our priority.  As part of our continuing mission to provide you with exceptional heart care, we have created designated Provider Care Teams.  These Care Teams include your primary Cardiologist (physician) and Advanced Practice Providers (APPs -  Physician Assistants and Nurse Practitioners) who all work together to provide you with the care you need, when you need it. You will need a follow up appointment in 6 months with Laura Ingold, NP and 1 year with Dr Skains.  Please call our office 2 months in advance to schedule this appointment.  You may see Mark Skains, MD or one of the following Advanced Practice Providers on your designated Care Team:   Lori Gerhardt, NP Laura Ingold, NP . Jill McDaniel, NP  Thank you for choosing [facility]!!     

## 2018-10-24 NOTE — Telephone Encounter (Signed)
Pt here for appt.

## 2018-10-24 NOTE — Telephone Encounter (Signed)
Enrollled patient for a 30 Day [employer] monitor to be mailed due to Covid-19. Brief instructions were gone over with the patient and she knows to expect the monitor to arrive in the next 2-3 days

## 2018-10-25 LAB — LIPID PANEL
Cholesterol: 174 mg/dL (ref <200)
HDL: 76 mg/dL (ref >=50)
LDL Cholesterol (Calc): 78 mg/dL (calc)
Non-HDL Cholesterol (Calc): 98 mg/dL (calc) (ref <130)
Total CHOL/HDL Ratio: 2.3 (calc) (ref <5.0)
Triglycerides: 123 mg/dL (ref <150)

## 2018-10-25 LAB — COMPLETE METABOLIC PANEL WITH GFR
AG Ratio: 1.7 (calc) (ref 1.0–2.5)
ALT: 11 U/L (ref 6–29)
AST: 10 U/L (ref 10–35)
Albumin: 3.8 g/dL (ref 3.6–5.1)
Alkaline phosphatase (APISO): 64 U/L (ref 37–153)
BUN: 23 mg/dL (ref 7–25)
CO2: 32 mmol/L (ref 20–32)
Calcium: 9.4 mg/dL (ref 8.6–10.4)
Chloride: 99 mmol/L (ref 98–110)
Creat: 0.77 mg/dL (ref 0.60–0.93)
GFR, Est African American: 89 mL/min/{1.73_m2} (ref >=60)
GFR, Est Non African American: 77 mL/min/{1.73_m2} (ref >=60)
Globulin: 2.2 g/dL (calc) (ref 1.9–3.7)
Glucose, Bld: 121 mg/dL — ABNORMAL HIGH (ref 65–99)
Potassium: 4.3 mmol/L (ref 3.5–5.3)
Sodium: 140 mmol/L (ref 135–146)
Total Bilirubin: 0.6 mg/dL (ref 0.2–1.2)
Total Protein: 6 g/dL — ABNORMAL LOW (ref 6.1–8.1)

## 2018-10-25 LAB — CBC WITH DIFFERENTIAL/PLATELET
Absolute Monocytes: 1294 cells/uL — ABNORMAL HIGH (ref 200–950)
Basophils Absolute: 34 cells/uL (ref 0–200)
Basophils Relative: 0.2 %
Eosinophils Absolute: 218 cells/uL (ref 15–500)
Eosinophils Relative: 1.3 %
HCT: 35.2 % (ref 35.0–45.0)
Hemoglobin: 11.6 g/dL — ABNORMAL LOW (ref 11.7–15.5)
Lymphs Abs: 1915 cells/uL (ref 850–3900)
MCH: 28.9 pg (ref 27.0–33.0)
MCHC: 33 g/dL (ref 32.0–36.0)
MCV: 87.6 fL (ref 80.0–100.0)
MPV: 11.4 fL (ref 7.5–12.5)
Monocytes Relative: 7.7 %
Neutro Abs: 13339 cells/uL — ABNORMAL HIGH (ref 1500–7800)
Neutrophils Relative %: 79.4 %
Platelets: 321 10*3/uL (ref 140–400)
RBC: 4.02 10*6/uL (ref 3.80–5.10)
RDW: 13.3 % (ref 11.0–15.0)
Total Lymphocyte: 11.4 %
WBC: 16.8 10*3/uL — ABNORMAL HIGH (ref 3.8–10.8)

## 2018-10-25 LAB — HEMOGLOBIN A1C
Hgb A1c MFr Bld: 8 % of total Hgb — ABNORMAL HIGH (ref <5.7)
Mean Plasma Glucose: 183 (calc)
eAG (mmol/L): 10.1 (calc)

## 2018-10-25 LAB — MICROALBUMIN, URINE: Microalb, Ur: 0.2 mg/dL

## 2018-10-26 ENCOUNTER — Other Ambulatory Visit: Payer: Self-pay

## 2018-10-26 MED ORDER — SITAGLIPTIN PHOSPHATE 100 MG PO TABS: 100.0000 mg | ORAL_TABLET | Freq: Every day | ORAL | 2 refills | Status: DC

## 2018-10-29 ENCOUNTER — Other Ambulatory Visit: Payer: Self-pay | Admitting: Cardiology

## 2018-11-20 ENCOUNTER — Other Ambulatory Visit: Payer: Self-pay | Admitting: Cardiology

## 2018-11-20 DIAGNOSIS — I10 Essential (primary) hypertension: Secondary | ICD-10-CM

## 2018-11-21 ENCOUNTER — Ambulatory Visit (INDEPENDENT_AMBULATORY_CARE_PROVIDER_SITE_OTHER): Payer: Medicare Other | Admitting: Adult Health

## 2018-11-21 ENCOUNTER — Other Ambulatory Visit: Payer: Self-pay

## 2018-11-21 ENCOUNTER — Encounter: Payer: Self-pay | Admitting: Adult Health

## 2018-11-21 VITALS — BP 132/80 | HR 72 | Temp 98.1°F | Ht 67.0 in | Wt 121.0 lb

## 2018-11-21 DIAGNOSIS — J439 Emphysema, unspecified: Secondary | ICD-10-CM | POA: Diagnosis not present

## 2018-11-21 DIAGNOSIS — J9611 Chronic respiratory failure with hypoxia: Secondary | ICD-10-CM | POA: Diagnosis not present

## 2018-11-21 DIAGNOSIS — J441 Chronic obstructive pulmonary disease with (acute) exacerbation: Secondary | ICD-10-CM | POA: Diagnosis not present

## 2018-11-21 MED ORDER — AZITHROMYCIN 250 MG PO TABS: ORAL_TABLET | ORAL | 0 refills | Status: AC

## 2018-11-21 MED ORDER — TIOTROPIUM BROMIDE MONOHYDRATE 2.5 MCG/ACT IN AERS: 2.0000 | INHALATION_SPRAY | Freq: Every day | RESPIRATORY_TRACT | 0 refills | Status: DC

## 2018-11-21 NOTE — Progress Notes (Signed)
@Patient  ID: Diane Patterson, female    DOB: Mar 25, 1946, 73 y.o.   MRN: [id number]  Chief Complaint  Patient presents with  . Follow-up    COPD     Referring provider: Susy Frizzle, MD  HPI: 73 year old female former smoker followed for chronic obstructive asthma andGOLD IVCOPD  TEST  PFT2009, shows very severe obstruction with an FEV1 of approximately 0.73 L, 27%.  2016 FEV1 32%. . 11/21/2018 Follow up : COPD  Patient has underlying severe COPD that is oxygen dependent.  Says that she has had increased cough and congestion for the last week.  Her husband also has some similar symptoms.  She denies any chest pain orthopnea PND or increased leg swelling.  She gets winded with minimal activity. Sputum culture earlier this month showed light mold growth, Aspergillus fumigatus.  Chest x-ray showed no acute process.  She denies any fever. She remains on Symbicort twice daily.  She is on oxygen at 2 L.  No Known Allergies  Immunization History  Administered Date(s) Administered  . Influenza Split 03/15/2011, 03/06/2012  . Influenza Whole 03/12/2009, 02/10/2010  . Influenza, High Dose Seasonal PF 04/01/2015, 03/05/2016, 03/18/2017  . Influenza,inj,Quad PF,6+ Mos 03/30/2013, 03/11/2014, 03/19/2017  . Pneumococcal Conjugate-13 11/14/2013  . Pneumococcal Polysaccharide-23 09/11/2010    Past Medical History:  Diagnosis Date  . Asthma   . Chronic airflow obstruction (HCC)   . Hypertension   . Tobacco abuse   . Venous insufficiency     Tobacco History: Social History   Tobacco Use  Smoking Status Former Smoker  . Packs/day: 0.75  . Years: 15.00  . Pack years: 11.25  . Types: Cigarettes  . Last attempt to quit: 03/20/2014  . Years since quitting: 4.6  Smokeless Tobacco Former Systems developer  . Quit date: 03/11/2014   Counseling given: Not Answered   Outpatient Medications Prior to Visit  Medication Sig Dispense Refill  . acetaminophen (TYLENOL) 325 MG tablet Take  650 mg by mouth every 6 (six) hours as needed for mild pain.     Marland Kitchen albuterol (PROVENTIL) (2.5 MG/3ML) 0.083% nebulizer solution Take 3 mLs (2.5 mg total) by nebulization every 6 (six) hours as needed for wheezing or shortness of breath. 150 mL 1  . albuterol (VENTOLIN HFA) 108 (90 Base) MCG/ACT inhaler Inhale 2 puffs into the lungs every 4 (four) hours as needed for wheezing or shortness of breath. 1 Inhaler 5  . apixaban (ELIQUIS) 5 MG TABS tablet Take 1 tablet (5 mg total) by mouth 2 (two) times daily. 60 tablet 11  . Azelastine-Fluticasone (DYMISTA) 137-50 MCG/ACT SUSP Place 2 sprays into the nose daily. 1 Bottle 5  . budesonide-formoterol (SYMBICORT) 160-4.5 MCG/ACT inhaler INHALE 2 PUFFS INTO LUNGS TWICE DAILY 10.2 Inhaler 2  . diltiazem (CARTIA XT) 240 MG 24 hr capsule Take 1 capsule (240 mg total) by mouth daily. 90 capsule 2  . hydrochlorothiazide (MICROZIDE) 12.5 MG capsule TAKE 2 CAPSULES(25 MG) BY MOUTH DAILY 180 capsule 3  . ipratropium-albuterol (DUONEB) 0.5-2.5 (3) MG/3ML SOLN Take 3 mLs by nebulization 2 (two) times daily as needed (wheeze and shortness of breath). 360 mL 0  . KLOR-CON M20 20 MEQ tablet TAKE 1 TABLET BY MOUTH EVERY DAY 90 tablet 3  . sitaGLIPtin (JANUVIA) 100 MG tablet Take 1 tablet (100 mg total) by mouth daily. (Patient not taking: Reported on 11/21/2018) 30 tablet 2   No facility-administered medications prior to visit.      Review of Systems:   Constitutional:  No  weight loss, night sweats,  Fevers, chills,  +fatigue, or  lassitude.  HEENT:   No headaches,  Difficulty swallowing,  Tooth/dental problems, or  Sore throat,                No sneezing, itching, ear ache, + nasal congestion, post nasal drip,   CV:  No chest pain,  Orthopnea, PND, swelling in lower extremities, anasarca, dizziness, palpitations, syncope.   GI  No heartburn, indigestion, abdominal pain, nausea, vomiting, diarrhea, change in bowel habits, loss of appetite, bloody stools.    Resp:    No chest wall deformity  Skin: no rash or lesions.  GU: no dysuria, change in color of urine, no urgency or frequency.  No flank pain, no hematuria   MS:  No joint pain or swelling.  No decreased range of motion.  No back pain.    Physical Exam  BP 132/80 (BP Location: Right Arm, Cuff Size: Normal)   Pulse 72   Temp 98.1 F (36.7 C)   Ht 5\' 7"  (1.702 m)   Wt 121 lb (54.9 kg)   LMP  (LMP Unknown)   SpO2 97%   BMI 18.95 kg/m   GEN: A/Ox3; pleasant , NAD, thin elderly , thin elderly, on O2    HEENT:  [place]/AT,  EACs-clear, TMs-wnl, NOSE-clear, THROAT-clear, no lesions, no postnasal drip or exudate noted.   NECK:  Supple w/ fair ROM; no JVD; normal carotid impulses w/o bruits; no thyromegaly or nodules palpated; no lymphadenopathy.    RESP  Decreased BS in bases  . no accessory muscle use, no dullness to percussion  CARD:  RRR, no m/r/g, no peripheral edema, pulses intact, no cyanosis or clubbing.  GI:   Soft & nt; nml bowel sounds; no organomegaly or masses detected.   Musco: Warm bil, no deformities or joint swelling noted.   Neuro: alert, no focal deficits noted.    Skin: Warm, no lesions or rashes    Lab Results:   BMET  Imaging: No results found.    No flowsheet data found.  No results found for: NITRICOXIDE      Assessment & Plan:   COPD (chronic obstructive pulmonary disease) (HCC) COPD exacerbation.  Will repeat sputum culture and sputum fungal and sputum AFB. Previous chest x-ray was clear with no sign of infiltrate.  Sputum earlier this year showed mild mold isolation with Aspergillus fumigatus Case was discussed with Dr. Lamonte Sakai will manage clinically and repeat cultures to see if remains present. Add Spiriva  Plan    Patient Instructions  Sputum Culture today .  Zpack take as directed . Take with food.  Continue on Symbicort 2 puffs Twice daily  , rinse after use  Begin Spiriva Respimat 2 puffs daily .  Continue on Oxygen 2lm   Follow up in 3 months with Dr. Lamonte Sakai  Or Wilma Michaelson NP and As needed   Please contact office for sooner follow up if symptoms do not improve or worsen or seek emergency care        Chronic respiratory failure with hypoxia ([place]) Cont on O2      Gabreille Dardis, NP 11/21/2018

## 2018-11-21 NOTE — Assessment & Plan Note (Signed)
Cont on O2 .  

## 2018-11-21 NOTE — Patient Instructions (Addendum)
Sputum Culture today .  Zpack take as directed . Take with food.  Continue on Symbicort 2 puffs Twice daily  , rinse after use  Begin Spiriva Respimat 2 puffs daily .  Continue on Oxygen 2lm  Follow up in 3 months with Dr. Lamonte Sakai  Or Mauriana Dann NP and As needed   Please contact office for sooner follow up if symptoms do not improve or worsen or seek emergency care

## 2018-11-21 NOTE — Assessment & Plan Note (Signed)
COPD exacerbation.  Will repeat sputum culture and sputum fungal and sputum AFB. Previous chest x-ray was clear with no sign of infiltrate.  Sputum earlier this year showed mild mold isolation with Aspergillus fumigatus Case was discussed with Dr. Lamonte Sakai will manage clinically and repeat cultures to see if remains present. Add Spiriva  Plan    Patient Instructions  Sputum Culture today .  Zpack take as directed . Take with food.  Continue on Symbicort 2 puffs Twice daily  , rinse after use  Begin Spiriva Respimat 2 puffs daily .  Continue on Oxygen 2lm  Follow up in 3 months with Dr. Lamonte Sakai  Or [place] NP and As needed   Please contact office for sooner follow up if symptoms do not improve or worsen or seek emergency care

## 2018-12-28 ENCOUNTER — Encounter: Payer: Self-pay | Admitting: Adult Health

## 2018-12-28 ENCOUNTER — Other Ambulatory Visit: Payer: Self-pay

## 2018-12-28 ENCOUNTER — Telehealth: Payer: Self-pay | Admitting: Adult Health

## 2018-12-28 ENCOUNTER — Ambulatory Visit (INDEPENDENT_AMBULATORY_CARE_PROVIDER_SITE_OTHER): Payer: Medicare Other | Admitting: Adult Health

## 2018-12-28 DIAGNOSIS — J9611 Chronic respiratory failure with hypoxia: Secondary | ICD-10-CM

## 2018-12-28 DIAGNOSIS — J441 Chronic obstructive pulmonary disease with (acute) exacerbation: Secondary | ICD-10-CM | POA: Diagnosis not present

## 2018-12-28 MED ORDER — PREDNISONE 10 MG PO TABS: ORAL_TABLET | ORAL | 0 refills | Status: DC

## 2018-12-28 MED ORDER — SPIRIVA RESPIMAT 2.5 MCG/ACT IN AERS: 2.0000 | INHALATION_SPRAY | Freq: Every day | RESPIRATORY_TRACT | 4 refills | Status: DC

## 2018-12-28 MED ORDER — DOXYCYCLINE HYCLATE 100 MG PO TABS: 100.0000 mg | ORAL_TABLET | Freq: Two times a day (BID) | ORAL | 0 refills | Status: DC

## 2018-12-28 NOTE — Telephone Encounter (Signed)
Pt c/o increased sob, chest congestion, prod cough with thick white mucus runny nose X this morning.  Pt believes this is a COPD flare, is requesting an abx.  States she could not come in for an appt until Monday d/t her husband's appts.  Pt requested that we ask TP specifically for recs.  Reviewed Covid-19 screening questionnaire and all symptoms besides those listed above were negative.    Pharmacy: [facility] on [address].   Tammy please advise on recs.  Thanks!

## 2018-12-28 NOTE — Addendum Note (Signed)
Addended by: Valerie Salts on: 12/28/2018 05:07 PM   Modules accepted: Orders

## 2018-12-28 NOTE — Telephone Encounter (Signed)
Does she want to do a televisit so we can discuss

## 2018-12-28 NOTE — Patient Instructions (Signed)
Doxcycline 100mg  Twice daily for 1 week, take with food.  Prednisone taper over next week.  Continue on Symbicort 2 puffs Twice daily  , rinse after use  Restart Spiriva Respimat 2 puffs daily.  Continue on Oxygen 2lm  Follow up in 2 months with Dr. Lamonte Sakai  Or Beniah Magnan NP and As needed   Please contact office for sooner follow up if symptoms do not improve or worsen or seek emergency care

## 2018-12-28 NOTE — Telephone Encounter (Signed)
Pt has been scheduled for a televisit today at 4:30 with TP.   Nothing further needed at this time- will close encounter.

## 2018-12-28 NOTE — Progress Notes (Signed)
Virtual Visit via Telephone Note  I connected with Diane Patterson on 12/28/18 at  4:30 PM EDT by telephone and verified that I am speaking with the correct person using two identifiers.  Location: Patient: Home  Provider: Office    I discussed the limitations, risks, security and privacy concerns of performing an evaluation and management service by telephone and the availability of in person appointments. I also discussed with the patient that there may be a patient responsible charge related to this service. The patient expressed understanding and agreed to proceed.   History of Present Illness: Today's Televisit is for an acute office visit for COPD    73 year old female former smoker followed for chronic obstructive asthma andGOLD IVCOPD Patient complains of increased cough and congestion for last few days feels that her mucus is getting too thick.  Is also having increased shortness of breath and wheezing.  Feels that her COPD is flaring.  Patient was seen approximately 6 weeks ago with a similar episode and given a Z-Pak.  She says she did get better.  She was also started on Spiriva but misunderstood the instructions.  And is no longer taking.  We talked about her recurrent exacerbations and the need to try to decrease these exacerbations. Patient denies any hemoptysis chest pain orthopnea.  Says her appetite is good with no nausea vomiting diarrhea.    Observations/Objective: PFT2009, shows very severe obstruction with an FEV1 of approximately 0.73 L, 27%.  2016 FEV1 32%.  Assessment and Plan: Recurrent COPD exacerbations  Add Spiriva   Oxygen dependent RF  Cont on O2  Plan  Patient Instructions  Doxcycline 100mg  Twice daily for 1 week, take with food.  Prednisone taper over next week.  Continue on Symbicort 2 puffs Twice daily  , rinse after use  Restart Spiriva Respimat 2 puffs daily.  Continue on Oxygen 2lm  Follow up in 2 months with Dr. Lamonte Sakai  Or Jago Carton NP  and As needed   Please contact office for sooner follow up if symptoms do not improve or worsen or seek emergency care       Follow Up Instructions: Follow-up in 2 months with Dr. Lamonte Sakai  Please contact office for sooner follow up if symptoms do not improve or worsen or seek emergency care     I discussed the assessment and treatment plan with the patient. The patient was provided an opportunity to ask questions and all were answered. The patient agreed with the plan and demonstrated an understanding of the instructions.   The patient was advised to call back or seek an in-person evaluation if the symptoms worsen or if the condition fails to improve as anticipated.  I provided 22 minutes of non-face-to-face time during this encounter.   Rexene Edison, NP

## 2019-01-21 ENCOUNTER — Other Ambulatory Visit: Payer: Self-pay | Admitting: Family Medicine

## 2019-01-21 DIAGNOSIS — J441 Chronic obstructive pulmonary disease with (acute) exacerbation: Secondary | ICD-10-CM

## 2019-01-22 ENCOUNTER — Other Ambulatory Visit: Payer: Self-pay | Admitting: Family Medicine

## 2019-01-22 ENCOUNTER — Telehealth: Payer: Self-pay | Admitting: Family Medicine

## 2019-01-22 MED ORDER — AZITHROMYCIN 250 MG PO TABS: ORAL_TABLET | ORAL | 0 refills | Status: DC

## 2019-01-22 MED ORDER — PREDNISONE 20 MG PO TABS: 40.0000 mg | ORAL_TABLET | Freq: Every day | ORAL | 0 refills | Status: DC

## 2019-01-22 NOTE — Telephone Encounter (Signed)
Pt called and states that she is having a COPD flare and would like an antibx and pred called in for her. Pt just had a tele-visit with her Pulmonologist for which antibx and pred was prescribed for her on 12/28/18.   CB# [phone] or [phone]

## 2019-01-22 NOTE — Telephone Encounter (Signed)
I sent prednisone and zpack to pharmacy.

## 2019-01-23 NOTE — Telephone Encounter (Signed)
Pt aware on 01/22/19

## 2019-02-09 ENCOUNTER — Ambulatory Visit: Payer: Medicare Other | Admitting: Family Medicine

## 2019-02-16 ENCOUNTER — Telehealth: Payer: Self-pay | Admitting: Family Medicine

## 2019-02-16 NOTE — Telephone Encounter (Signed)
Patient calling to see if she can get refill on her prednisone and also talk about some sob she is having  Please call her back at [phone]

## 2019-02-20 ENCOUNTER — Ambulatory Visit (INDEPENDENT_AMBULATORY_CARE_PROVIDER_SITE_OTHER): Payer: Medicare Other | Admitting: Family Medicine

## 2019-02-20 ENCOUNTER — Encounter: Payer: Self-pay | Admitting: Family Medicine

## 2019-02-20 VITALS — BP 134/82 | HR 87 | Temp 97.9°F | Resp 16 | Ht 67.0 in | Wt 118.0 lb

## 2019-02-20 DIAGNOSIS — E119 Type 2 diabetes mellitus without complications: Secondary | ICD-10-CM | POA: Diagnosis not present

## 2019-02-20 DIAGNOSIS — R634 Abnormal weight loss: Secondary | ICD-10-CM | POA: Diagnosis not present

## 2019-02-20 DIAGNOSIS — J441 Chronic obstructive pulmonary disease with (acute) exacerbation: Secondary | ICD-10-CM

## 2019-02-20 MED ORDER — IPRATROPIUM-ALBUTEROL 0.5-2.5 (3) MG/3ML IN SOLN: 3.0000 mL | Freq: Two times a day (BID) | RESPIRATORY_TRACT | 0 refills | Status: DC | PRN

## 2019-02-20 MED ORDER — IPRATROPIUM-ALBUTEROL 0.5-2.5 (3) MG/3ML IN SOLN
3.0000 mL | Freq: Once | RESPIRATORY_TRACT | Status: AC
  Administered 2019-02-20: 3 mL via RESPIRATORY_TRACT

## 2019-02-20 MED ORDER — AZITHROMYCIN 250 MG PO TABS: ORAL_TABLET | ORAL | 0 refills | Status: DC

## 2019-02-20 MED ORDER — PREDNISONE 20 MG PO TABS: 40.0000 mg | ORAL_TABLET | Freq: Every day | ORAL | 0 refills | Status: DC

## 2019-02-20 MED ORDER — METHYLPREDNISOLONE ACETATE 80 MG/ML IJ SUSP
80.0000 mg | Freq: Once | INTRAMUSCULAR | Status: AC
  Administered 2019-02-20: 60 mg via INTRAMUSCULAR

## 2019-02-20 NOTE — Addendum Note (Signed)
Addended by: Launa Grill on: 02/20/2019 05:02 PM   Modules accepted: Orders

## 2019-02-20 NOTE — Progress Notes (Signed)
Subjective:    Patient ID: DEMPLE CISSE, female    DOB: January 17, 1946, 73 y.o.   MRN: [id number]  HPI  10/09/18 The patient symptoms began Saturday evening and worsened yesterday.  She reports worsening cough, worsening chest congestion, wheezing, and increasing shortness of breath.  Her cough is also become productive of grayish-yellow sputum which typically it is not.  She independently started taking 40 mg of prednisone yesterday and notices some improvement this morning.  She has been using her albuterol every 6 hours in addition to her Symbicort.  She denies any fever.  She denies any chills.  She denies any chest pain.  She denies any pleurisy.  There is no respiratory distress.  There is no increased work of breathing however her lung sounds are diminished dramatically bilaterally with decreased airflow and prolonged expiratory phase and expiratory wheezing is appreciated in all 4 lung fields.  At that time, my plan was: I believe the patient has a COPD exacerbation.  Patient received 80 mg of Depo-Medrol IM x1 here today.  She will start 60 mg of prednisone tomorrow orally and then follow-up here on Friday for reassessment.  I also started doxycycline 100 mg p.o. twice daily for 10 days and recommended she use albuterol 2 puffs inhaled every 6 hours until better.  Continue her current Symbicort  10/13/18 Patient's breathing has improved substantially since when I saw her earlier this week.  She is still on 60 mg a day of prednisone.  However her wheezing has improved.  The chest congestion has symptomatically improved.  Her cough is nonproductive.  She denies any fevers or chills.  She denies any purulent sputum.  She denies any hemoptysis.  She denies any chest pain.  However her blood pressure is elevated at 180/92.  2 out of the last 3 office visits the patient has had at my office her blood pressure has been elevated despite taking hydrochlorothiazide.  I confirmed her blood pressure today  personally and found it to be similar at 178/90.  At that time, my plan was: Blood pressure is elevated.  I will add losartan 50 mg a day to her hydrochlorothiazide and recheck her blood pressure at her next office visit.  Wean down on prednisone.  Decrease prednisone to 40 mg a day for 3 days and then 20 mg a day for 3 days and then discontinue.  I will send the patient a prescription for prednisone tablets.  To have tablets of prednisone on hand so that she can start prednisone therapy immediately if she develops an exacerbation.  Therefore I will send her a bottle of 20 mg prednisone pills to have on hand at home but I cautioned the patient not to start them unless she is developing an attack.  Patient is very familiar with the symptoms and knows how and when to begin prednisone.  She will not take them daily.  10/23/18 At the last visit, I started the patient on losartan 50 mg a day due to her elevated blood pressure.  Patient states that Friday, she felt extremely weak like she was going to pass out.  She was also having difficulty breathing.  She denies any chest pain.  EMS was contacted.  At that time her blood pressure was extremely low.  She discontinued her losartan and her blood pressure has improved.  Since that time she has felt better.  However she states that she has been having the spells of feeling extremely weak and lightheaded  like she may pass out more frequently over the last few weeks.  She denies any palpitations.  She denies any chest pain.  She has shortness of breath at rest anyway based on her severe oxygen dependent COPD however she denies any changes in her shortness of breath.  She denies any tachycardia that she can feel or notice however she has a history of A. fib sees Dr. Marlou Porch.  She had an echocardiogram last year in July that showed an ejection fraction of 65 to 70%.  She does have a history of prediabetes.  Her last hemoglobin A1c was 6.2 in December.  She is not currently on  any hypoglycemic agent that should cause lightheadedness or dizziness however she is not checking her blood sugar and therefore hyperglycemia is certainly a possibility given her recent use of prednisone for COPD.  At that time, my plan was: I am concerned that her near syncope could either be due to cardiac arrhythmia or possibly hypotension secondary to medication.  Therefore I recommended that she discontinue losartan completely.  Even though her blood pressure here was extremely high previously it is now well controlled today at 120/64.  I asked her to buy blood pressure cuff and start checking her blood pressure 3 times a day at home and noting the values so that we can see if her blood pressures fluctuating at home.  I also asked her to check her blood pressure whenever she feels weak or lightheaded to see if low blood pressure could be the cause of this.  I am also concerned that her A. fib may be the root cause of her near syncope.  Is possible the patient may be having episodic bradycardia or possibly tachycardia causing her to feel lightheaded due to cerebral hypoperfusion.  Therefore I will have our scheduler contact her cardiologist office and see if we can get her scheduled for an event monitor soon as possible.  Also given her history of prediabetes I will check a hemoglobin A1c as well as a CBC a CMP and a fasting lipid panel while drawing lab work.  However I believe this is much less likely the cause of her near syncope.  I am more concerned about cerebral hypoperfusion due to cardiac arrhythmia or hypotension.  Hopefully if it was due to her medications stopping the losartan will prevent the hypotension from happening again.  I have asked the patient to continue wearing her oxygen constantly until we get to the bottom of this as it is possible hypoxia may potentially cause her lightheadedness as well.  Her oxygen today is 99% on 2 L  02/20/19 Patient is here today requesting prednisone and  antibiotics.  She was last on prednisone and antibiotics in August on August 10.  She states that 2 weeks ago, she started developing increasing shortness of breath.  Last week she started developing some mild trace green sputum.  She denies any fever.  She denies any chills.  She denies any hemoptysis.  She denies any significant chest pain.  However she is having to use her rescue inhaler several times a day due to dyspnea and wheezing.  She denies any angina.  She denies any chest pressure.  She does report worsening cough, worsening bronchospasm, and increased wheezing.  Today on examination, the patient has diminished breath sounds in both lungs.  She has faint expiratory wheezing heard throughout however she has very little air exchange and so it is difficult to evaluate for any abnormal breath  sounds.  I do not appreciate for any rhonchi or crackles.  There is no pitting edema or JVD.  The patient does not appear to be fluid overloaded. Past Medical History:  Diagnosis Date   Asthma    Chronic airflow obstruction (HCC)    Hypertension    Tobacco abuse    Venous insufficiency    Past Surgical History:  Procedure Laterality Date   cataract     NECK SURGERY     no colonoscopy     "afraid to "; SOC reviewed   VESICOVAGINAL FISTULA CLOSURE W/ TAH     Current Outpatient Medications on File Prior to Visit  Medication Sig Dispense Refill   acetaminophen (TYLENOL) 325 MG tablet Take 650 mg by mouth every 6 (six) hours as needed for mild pain.      albuterol (PROVENTIL) (2.5 MG/3ML) 0.083% nebulizer solution Take 3 mLs (2.5 mg total) by nebulization every 6 (six) hours as needed for wheezing or shortness of breath. 150 mL 1   albuterol (VENTOLIN HFA) 108 (90 Base) MCG/ACT inhaler Inhale 2 puffs into the lungs every 4 (four) hours as needed for wheezing or shortness of breath. 1 Inhaler 5   apixaban (ELIQUIS) 5 MG TABS tablet Take 1 tablet (5 mg total) by mouth 2 (two) times daily. 60  tablet 11   Azelastine-Fluticasone (DYMISTA) 137-50 MCG/ACT SUSP Place 2 sprays into the nose daily. 1 Bottle 5   azithromycin (ZITHROMAX) 250 MG tablet 2 tabs poqday1, 1 tab poqday 2-5 6 tablet 0   budesonide-formoterol (SYMBICORT) 160-4.5 MCG/ACT inhaler INHALE 2 PUFFS INTO LUNGS TWICE DAILY 10.2 Inhaler 5   diltiazem (CARTIA XT) 240 MG 24 hr capsule Take 1 capsule (240 mg total) by mouth daily. 90 capsule 2   doxycycline (VIBRA-TABS) 100 MG tablet Take 1 tablet (100 mg total) by mouth 2 (two) times daily. 14 tablet 0   hydrochlorothiazide (MICROZIDE) 12.5 MG capsule TAKE 2 CAPSULES(25 MG) BY MOUTH DAILY 180 capsule 3   ipratropium-albuterol (DUONEB) 0.5-2.5 (3) MG/3ML SOLN Take 3 mLs by nebulization 2 (two) times daily as needed (wheeze and shortness of breath). 360 mL 0   KLOR-CON M20 20 MEQ tablet TAKE 1 TABLET BY MOUTH EVERY DAY 90 tablet 3   predniSONE (DELTASONE) 10 MG tablet 4 tabs for 2 days, then 3 tabs for 2 days, 2 tabs for 2 days, then 1 tab for 2 days, then stop 20 tablet 0   predniSONE (DELTASONE) 20 MG tablet Take 2 tablets (40 mg total) by mouth daily with breakfast. 14 tablet 0   Tiotropium Bromide Monohydrate (SPIRIVA RESPIMAT) 2.5 MCG/ACT AERS Inhale 2 puffs into the lungs daily. 4 g 4   No current facility-administered medications on file prior to visit.    No Known Allergies Social History   Socioeconomic History   Marital status: Married    Spouse name: Not on file   Number of children: 1   Years of education: Not on file   Highest education level: Not on file  Occupational History   Not on file  Social Needs   Financial resource strain: Not on file   Food insecurity    Worry: Not on file    Inability: Not on file   Transportation needs    Medical: Not on file    Non-medical: Not on file  Tobacco Use   Smoking status: Former Smoker    Packs/day: 0.75    Years: 15.00    Pack years: 11.25  Types: Cigarettes    Quit date: 03/20/2014     Years since quitting: 4.9   Smokeless tobacco: Former Systems developer    Quit date: 03/11/2014  Substance and Sexual Activity   Alcohol use: No    Alcohol/week: 0.0 standard drinks   Drug use: No   Sexual activity: Not on file  Lifestyle   Physical activity    Days per week: Not on file    Minutes per session: Not on file   Stress: Not on file  Relationships   Social connections    Talks on phone: Not on file    Gets together: Not on file    Attends religious service: Not on file    Active member of club or organization: Not on file    Attends meetings of clubs or organizations: Not on file    Relationship status: Not on file   Intimate partner violence    Fear of current or ex partner: Not on file    Emotionally abused: Not on file    Physically abused: Not on file    Forced sexual activity: Not on file  Other Topics Concern   Not on file  Social History Narrative   Not on file      Review of Systems  All other systems reviewed and are negative.      Objective:   Physical Exam Vitals signs reviewed.  Constitutional:      Appearance: She is well-developed.  HENT:     Nose: Nose normal.     Mouth/Throat:     Pharynx: No oropharyngeal exudate.  Neck:     Vascular: No JVD.  Cardiovascular:     Rate and Rhythm: Normal rate. Rhythm irregular.     Heart sounds: Normal heart sounds.  Pulmonary:     Effort: Pulmonary effort is normal. No respiratory distress.     Breath sounds: No stridor. Examination of the right-upper field reveals decreased breath sounds and wheezing. Examination of the left-upper field reveals decreased breath sounds and wheezing. Examination of the right-middle field reveals decreased breath sounds and wheezing. Examination of the left-middle field reveals decreased breath sounds and wheezing. Examination of the right-lower field reveals decreased breath sounds and wheezing. Examination of the left-lower field reveals decreased breath sounds and  wheezing. Decreased breath sounds and wheezing present. No rhonchi or rales.  Chest:     Chest wall: No tenderness.  Abdominal:     General: Bowel sounds are normal.     Palpations: Abdomen is soft.  Musculoskeletal:     Right lower leg: No edema.     Left lower leg: No edema.  Lymphadenopathy:     Cervical: No cervical adenopathy.          Assessment & Plan:  Type 2 diabetes mellitus without complication, without long-term current use of insulin (HCC) - Plan: Hemoglobin A1c, CBC with Differential/Platelet, COMPLETE METABOLIC PANEL WITH GFR, Lipid panel  Essential hypertension  COPD exacerbation ([place])  I believe the patient is having bronchospasms related to her COPD.  I do not believe that this is a bacterial infection.  Therefore I recommended that we try prednisone in addition to rescue inhalers to see if this will help with her bronchospasm and shortness of breath.  If she develops fever or increasing purulent sputum, I would add a Z-Pak.  However today the patient received a DuoNeb.  She received 60 mg of Depo-Medrol IM x1.  I will start the patient on prednisone 40 mg a  day for the next 7 days.  If she develops any purulent sputum, chest pain, or fever she can start the Z-Pak which I did give her a prescription for.  I gave the patient a prescription for DuoNeb 2.5/0.5.  She can inhale 1 nebulizer treatment every 6 hours as needed.  Patient states that she feels some better after receiving the DuoNeb.  She is lost weight and she will return in the morning for fasting lab work..  Given the weight loss, the shortness of breath, her underlying COPD and history of tobacco abuse, I would recommend chest x-ray.  Patient will go by and get that either later today or tomorrow

## 2019-02-21 NOTE — Telephone Encounter (Signed)
Patient seen on yesterday 02/20/2019

## 2019-02-22 ENCOUNTER — Ambulatory Visit: Payer: Medicare Other | Admitting: Adult Health

## 2019-03-05 ENCOUNTER — Other Ambulatory Visit: Payer: Self-pay

## 2019-03-05 ENCOUNTER — Other Ambulatory Visit: Payer: Medicare Other

## 2019-03-06 ENCOUNTER — Ambulatory Visit (INDEPENDENT_AMBULATORY_CARE_PROVIDER_SITE_OTHER): Payer: Medicare Other | Admitting: Family Medicine

## 2019-03-06 ENCOUNTER — Encounter: Payer: Self-pay | Admitting: Family Medicine

## 2019-03-06 VITALS — BP 144/88 | HR 68 | Temp 98.4°F | Resp 18 | Ht 67.0 in | Wt 118.0 lb

## 2019-03-06 DIAGNOSIS — D72829 Elevated white blood cell count, unspecified: Secondary | ICD-10-CM | POA: Diagnosis not present

## 2019-03-06 DIAGNOSIS — E119 Type 2 diabetes mellitus without complications: Secondary | ICD-10-CM | POA: Diagnosis not present

## 2019-03-06 DIAGNOSIS — R634 Abnormal weight loss: Secondary | ICD-10-CM

## 2019-03-06 DIAGNOSIS — I10 Essential (primary) hypertension: Secondary | ICD-10-CM | POA: Diagnosis not present

## 2019-03-06 LAB — CBC WITH DIFFERENTIAL/PLATELET
Absolute Monocytes: 1141 cells/uL — ABNORMAL HIGH (ref 200–950)
Basophils Absolute: 56 cells/uL (ref 0–200)
Basophils Relative: 0.3 %
Eosinophils Absolute: 131 cells/uL (ref 15–500)
Eosinophils Relative: 0.7 %
HCT: 41.3 % (ref 35.0–45.0)
Hemoglobin: 13.3 g/dL (ref 11.7–15.5)
Lymphs Abs: 3759 cells/uL (ref 850–3900)
MCH: 28.7 pg (ref 27.0–33.0)
MCHC: 32.2 g/dL (ref 32.0–36.0)
MCV: 89 fL (ref 80.0–100.0)
MPV: 11.1 fL (ref 7.5–12.5)
Monocytes Relative: 6.1 %
Neutro Abs: 13614 cells/uL — ABNORMAL HIGH (ref 1500–7800)
Neutrophils Relative %: 72.8 %
Platelets: 361 10*3/uL (ref 140–400)
RBC: 4.64 10*6/uL (ref 3.80–5.10)
RDW: 13.3 % (ref 11.0–15.0)
Total Lymphocyte: 20.1 %
WBC: 18.7 10*3/uL — ABNORMAL HIGH (ref 3.8–10.8)

## 2019-03-06 LAB — URINALYSIS, ROUTINE W REFLEX MICROSCOPIC
Bacteria, UA: NONE SEEN /HPF
Bilirubin Urine: NEGATIVE
Glucose, UA: NEGATIVE
Hyaline Cast: NONE SEEN /LPF
Leukocytes,Ua: NEGATIVE
Nitrite: NEGATIVE
Specific Gravity, Urine: 1.024 (ref 1.001–1.03)
pH: 5.5 (ref 5.0–8.0)

## 2019-03-06 LAB — COMPLETE METABOLIC PANEL WITH GFR
AG Ratio: 1.5 (calc) (ref 1.0–2.5)
ALT: 9 U/L (ref 6–29)
AST: 9 U/L — ABNORMAL LOW (ref 10–35)
Albumin: 4.1 g/dL (ref 3.6–5.1)
Alkaline phosphatase (APISO): 66 U/L (ref 37–153)
BUN: 24 mg/dL (ref 7–25)
CO2: 31 mmol/L (ref 20–32)
Calcium: 9.8 mg/dL (ref 8.6–10.4)
Chloride: 100 mmol/L (ref 98–110)
Creat: 0.86 mg/dL (ref 0.60–0.93)
GFR, Est African American: 78 mL/min/{1.73_m2} (ref >=60)
GFR, Est Non African American: 67 mL/min/{1.73_m2} (ref >=60)
Globulin: 2.7 g/dL (calc) (ref 1.9–3.7)
Glucose, Bld: 83 mg/dL (ref 65–99)
Potassium: 3.9 mmol/L (ref 3.5–5.3)
Sodium: 141 mmol/L (ref 135–146)
Total Bilirubin: 0.5 mg/dL (ref 0.2–1.2)
Total Protein: 6.8 g/dL (ref 6.1–8.1)

## 2019-03-06 LAB — LIPID PANEL
Cholesterol: 197 mg/dL (ref <200)
HDL: 86 mg/dL (ref >=50)
LDL Cholesterol (Calc): 88 mg/dL (calc)
Non-HDL Cholesterol (Calc): 111 mg/dL (calc) (ref <130)
Total CHOL/HDL Ratio: 2.3 (calc) (ref <5.0)
Triglycerides: 126 mg/dL (ref <150)

## 2019-03-06 LAB — HEMOGLOBIN A1C
Hgb A1c MFr Bld: 7.1 % of total Hgb — ABNORMAL HIGH (ref <5.7)
Mean Plasma Glucose: 157 (calc)
eAG (mmol/L): 8.7 (calc)

## 2019-03-06 LAB — MICROSCOPIC MESSAGE

## 2019-03-06 NOTE — Progress Notes (Signed)
Subjective:    Patient ID: Diane Patterson, female    DOB: [birth year], 73 y.o.   MRN: [id number]  HPI  10/09/18 The patient symptoms began Saturday evening and worsened yesterday.  She reports worsening cough, worsening chest congestion, wheezing, and increasing shortness of breath.  Her cough is also become productive of grayish-yellow sputum which typically it is not.  She independently started taking 40 mg of prednisone yesterday and notices some improvement this morning.  She has been using her albuterol every 6 hours in addition to her Symbicort.  She denies any fever.  She denies any chills.  She denies any chest pain.  She denies any pleurisy.  There is no respiratory distress.  There is no increased work of breathing however her lung sounds are diminished dramatically bilaterally with decreased airflow and prolonged expiratory phase and expiratory wheezing is appreciated in all 4 lung fields.  At that time, my plan was: I believe the patient has a COPD exacerbation.  Patient received 80 mg of Depo-Medrol IM x1 here today.  She will start 60 mg of prednisone tomorrow orally and then follow-up here on Friday for reassessment.  I also started doxycycline 100 mg p.o. twice daily for 10 days and recommended she use albuterol 2 puffs inhaled every 6 hours until better.  Continue her current Symbicort  10/13/18 Patient's breathing has improved substantially since when I saw her earlier this week.  She is still on 60 mg a day of prednisone.  However her wheezing has improved.  The chest congestion has symptomatically improved.  Her cough is nonproductive.  She denies any fevers or chills.  She denies any purulent sputum.  She denies any hemoptysis.  She denies any chest pain.  However her blood pressure is elevated at 180/92.  2 out of the last 3 office visits the patient has had at my office her blood pressure has been elevated despite taking hydrochlorothiazide.  I confirmed her blood pressure today  personally and found it to be similar at 178/90.  At that time, my plan was: Blood pressure is elevated.  I will add losartan 50 mg a day to her hydrochlorothiazide and recheck her blood pressure at her next office visit.  Wean down on prednisone.  Decrease prednisone to 40 mg a day for 3 days and then 20 mg a day for 3 days and then discontinue.  I will send the patient a prescription for prednisone tablets.  To have tablets of prednisone on hand so that she can start prednisone therapy immediately if she develops an exacerbation.  Therefore I will send her a bottle of 20 mg prednisone pills to have on hand at home but I cautioned the patient not to start them unless she is developing an attack.  Patient is very familiar with the symptoms and knows how and when to begin prednisone.  She will not take them daily.  10/23/18 At the last visit, I started the patient on losartan 50 mg a day due to her elevated blood pressure.  Patient states that Friday, she felt extremely weak like she was going to pass out.  She was also having difficulty breathing.  She denies any chest pain.  EMS was contacted.  At that time her blood pressure was extremely low.  She discontinued her losartan and her blood pressure has improved.  Since that time she has felt better.  However she states that she has been having the spells of feeling extremely weak and lightheaded  like she may pass out more frequently over the last few weeks.  She denies any palpitations.  She denies any chest pain.  She has shortness of breath at rest anyway based on her severe oxygen dependent COPD however she denies any changes in her shortness of breath.  She denies any tachycardia that she can feel or notice however she has a history of A. fib sees Dr. Marlou Porch.  She had an echocardiogram last year in July that showed an ejection fraction of 65 to 70%.  She does have a history of prediabetes.  Her last hemoglobin A1c was 6.2 in December.  She is not currently on  any hypoglycemic agent that should cause lightheadedness or dizziness however she is not checking her blood sugar and therefore hyperglycemia is certainly a possibility given her recent use of prednisone for COPD.  At that time, my plan was: I am concerned that her near syncope could either be due to cardiac arrhythmia or possibly hypotension secondary to medication.  Therefore I recommended that she discontinue losartan completely.  Even though her blood pressure here was extremely high previously it is now well controlled today at 120/64.  I asked her to buy blood pressure cuff and start checking her blood pressure 3 times a day at home and noting the values so that we can see if her blood pressures fluctuating at home.  I also asked her to check her blood pressure whenever she feels weak or lightheaded to see if low blood pressure could be the cause of this.  I am also concerned that her A. fib may be the root cause of her near syncope.  Is possible the patient may be having episodic bradycardia or possibly tachycardia causing her to feel lightheaded due to cerebral hypoperfusion.  Therefore I will have our scheduler contact her cardiologist office and see if we can get her scheduled for an event monitor soon as possible.  Also given her history of prediabetes I will check a hemoglobin A1c as well as a CBC a CMP and a fasting lipid panel while drawing lab work.  However I believe this is much less likely the cause of her near syncope.  I am more concerned about cerebral hypoperfusion due to cardiac arrhythmia or hypotension.  Hopefully if it was due to her medications stopping the losartan will prevent the hypotension from happening again.  I have asked the patient to continue wearing her oxygen constantly until we get to the bottom of this as it is possible hypoxia may potentially cause her lightheadedness as well.  Her oxygen today is 99% on 2 L  02/20/19 Patient is here today requesting prednisone and  antibiotics.  She was last on prednisone and antibiotics in August on August 10.  She states that 2 weeks ago, she started developing increasing shortness of breath.  Last week she started developing some mild trace green sputum.  She denies any fever.  She denies any chills.  She denies any hemoptysis.  She denies any significant chest pain.  However she is having to use her rescue inhaler several times a day due to dyspnea and wheezing.  She denies any angina.  She denies any chest pressure.  She does report worsening cough, worsening bronchospasm, and increased wheezing.  Today on examination, the patient has diminished breath sounds in both lungs.  She has faint expiratory wheezing heard throughout however she has very little air exchange and so it is difficult to evaluate for any abnormal breath  sounds.  I do not appreciate for any rhonchi or crackles.  There is no pitting edema or JVD.  The patient does not appear to be fluid overloaded.  At that time, my plan was: I believe the patient is having bronchospasms related to her COPD.  I do not believe that this is a bacterial infection.  Therefore I recommended that we try prednisone in addition to rescue inhalers to see if this will help with her bronchospasm and shortness of breath.  If she develops fever or increasing purulent sputum, I would add a Z-Pak.  However today the patient received a DuoNeb.  She received 60 mg of Depo-Medrol IM x1.  I will start the patient on prednisone 40 mg a day for the next 7 days.  If she develops any purulent sputum, chest pain, or fever she can start the Z-Pak which I did give her a prescription for.  I gave the patient a prescription for DuoNeb 2.5/0.5.  She can inhale 1 nebulizer treatment every 6 hours as needed.  Patient states that she feels some better after receiving the DuoNeb.  She is lost weight and she will return in the morning for fasting lab work..  Given the weight loss, the shortness of breath, her underlying  COPD and history of tobacco abuse, I would recommend chest x-ray.  Patient will go by and get that either later today or tomorrow  03/06/19 Patient did not get CXR.  Most recent labs show leukocytosis. No visits with results within 1 Week(s) from this visit.  Latest known visit with results is:  Office Visit on 02/20/2019  Component Date Value Ref Range Status   Hgb A1c MFr Bld 03/05/2019 7.1* <5.7 % of total Hgb Final   Comment: For someone without known diabetes, a hemoglobin A1c value of 6.5% or greater indicates that they may have  diabetes and this should be confirmed with a follow-up  test. . For someone with known diabetes, a value <7% indicates  that their diabetes is well controlled and a value  greater than or equal to 7% indicates suboptimal  control. A1c targets should be individualized based on  duration of diabetes, age, comorbid conditions, and  other considerations. . Currently, no consensus exists regarding use of hemoglobin A1c for diagnosis of diabetes for children. .    Mean Plasma Glucose 03/05/2019 157  (calc) Final   eAG (mmol/L) 03/05/2019 8.7  (calc) Final   WBC 03/05/2019 18.7* 3.8 - 10.8 Thousand/uL Final   RBC 03/05/2019 4.64  3.80 - 5.10 Million/uL Final   Hemoglobin 03/05/2019 13.3  11.7 - 15.5 g/dL Final   HCT 03/05/2019 41.3  35.0 - 45.0 % Final   MCV 03/05/2019 89.0  80.0 - 100.0 fL Final   MCH 03/05/2019 28.7  27.0 - 33.0 pg Final   MCHC 03/05/2019 32.2  32.0 - 36.0 g/dL Final   RDW 03/05/2019 13.3  11.0 - 15.0 % Final   Platelets 03/05/2019 361  140 - 400 Thousand/uL Final   MPV 03/05/2019 11.1  7.5 - 12.5 fL Final   Neutro Abs 03/05/2019 13,614* 1,500 - 7,800 cells/uL Final   Lymphs Abs 03/05/2019 3,759  850 - 3,900 cells/uL Final   Absolute Monocytes 03/05/2019 1,141* 200 - 950 cells/uL Final   Eosinophils Absolute 03/05/2019 131  15 - 500 cells/uL Final   Basophils Absolute 03/05/2019 56  0 - 200 cells/uL Final    Neutrophils Relative % 03/05/2019 72.8  % Final   Total Lymphocyte 03/05/2019  20.1  % Final   Monocytes Relative 03/05/2019 6.1  % Final   Eosinophils Relative 03/05/2019 0.7  % Final   Basophils Relative 03/05/2019 0.3  % Final   Glucose, Bld 03/05/2019 83  65 - 99 mg/dL Final   Comment: .            Fasting reference interval .    BUN 03/05/2019 24  7 - 25 mg/dL Final   Creat 03/05/2019 0.86  0.60 - 0.93 mg/dL Final   Comment: For patients >28 years of age, the reference limit for Creatinine is approximately 13% higher for people identified as African-American. .    GFR, Est Non African American 03/05/2019 67  > OR = 60 mL/min/1.88m2 Final   GFR, Est African American 03/05/2019 78  > OR = 60 mL/min/1.20m2 Final   BUN/Creatinine Ratio [id number] NOT APPLICABLE  6 - 22 (calc) Final   Sodium 03/05/2019 141  135 - 146 mmol/L Final   Potassium 03/05/2019 3.9  3.5 - 5.3 mmol/L Final   Chloride 03/05/2019 100  98 - 110 mmol/L Final   CO2 03/05/2019 31  20 - 32 mmol/L Final   Calcium 03/05/2019 9.8  8.6 - 10.4 mg/dL Final   Total Protein 03/05/2019 6.8  6.1 - 8.1 g/dL Final   Albumin 03/05/2019 4.1  3.6 - 5.1 g/dL Final   Globulin 03/05/2019 2.7  1.9 - 3.7 g/dL (calc) Final   AG Ratio 03/05/2019 1.5  1.0 - 2.5 (calc) Final   Total Bilirubin 03/05/2019 0.5  0.2 - 1.2 mg/dL Final   Alkaline phosphatase (APISO) 03/05/2019 66  37 - 153 U/L Final   AST 03/05/2019 9* 10 - 35 U/L Final   ALT 03/05/2019 9  6 - 29 U/L Final   Cholesterol 03/05/2019 197  <200 mg/dL Final   HDL 03/05/2019 86  > OR = 50 mg/dL Final   Triglycerides 03/05/2019 126  <150 mg/dL Final   LDL Cholesterol (Calc) 03/05/2019 88  mg/dL (calc) Final   Comment: Reference range: <100 . Desirable range <100 mg/dL for primary prevention;   <70 mg/dL for patients with CHD or diabetic patients  with > or = 2 CHD risk factors. Marland Kitchen LDL-C is now calculated using the Martin-Hopkins  calculation, which is  a validated novel method providing  better accuracy than the Friedewald equation in the  estimation of LDL-C.  Cresenciano Genre et al. Annamaria Helling. [id number]): 2061-2068  (http://education.[facility].com/faq/FAQ164)    Total CHOL/HDL Ratio 03/05/2019 2.3  <5.0 (calc) Final   Non-HDL Cholesterol (Calc) 03/05/2019 111  <130 mg/dL (calc) Final   Comment: For patients with diabetes plus 1 major ASCVD risk  factor, treating to a non-HDL-C goal of <100 mg/dL  (LDL-C of <70 mg/dL) is considered a therapeutic  option.    Wt Readings from Last 3 Encounters:  03/06/19 118 lb (53.5 kg)  02/20/19 118 lb (53.5 kg)  11/21/18 121 lb (54.9 kg)   Weight is down 3 lbs since June.  However patient feels that she is not eating because her appetite is poor.  Her husband is in rehab.  She lives alone.  She feels extremely lonely.  She is very scared at night.  She is worried about her husband.  She believes all of this has affected her appetite.  Regarding the elevated white blood cell count, the patient just finished prednisone 24 hours ago.  She was taking 40 mg a day.  She denies any chest pain.  She denies any shortness of  breath beyond her baseline.  She denies any new cough or purulent sputum.  She denies any abdominal pain nausea vomiting.  She denies any diarrhea.  She denies any melena or hematochezia.  She denies any dysuria urgency or frequency.  She denies any otalgia or sinus pain or sore throat.  She denies any rash. Past Medical History:  Diagnosis Date   Asthma    Chronic airflow obstruction (HCC)    Hypertension    Tobacco abuse    Venous insufficiency    Past Surgical History:  Procedure Laterality Date   cataract     NECK SURGERY     no colonoscopy     "afraid to "; SOC reviewed   VESICOVAGINAL FISTULA CLOSURE W/ TAH     Current Outpatient Medications on File Prior to Visit  Medication Sig Dispense Refill   acetaminophen (TYLENOL) 325 MG tablet Take 650 mg by mouth every 6  (six) hours as needed for mild pain.      albuterol (PROVENTIL) (2.5 MG/3ML) 0.083% nebulizer solution Take 3 mLs (2.5 mg total) by nebulization every 6 (six) hours as needed for wheezing or shortness of breath. 150 mL 1   albuterol (VENTOLIN HFA) 108 (90 Base) MCG/ACT inhaler Inhale 2 puffs into the lungs every 4 (four) hours as needed for wheezing or shortness of breath. 1 Inhaler 5   apixaban (ELIQUIS) 5 MG TABS tablet Take 1 tablet (5 mg total) by mouth 2 (two) times daily. 60 tablet 11   Azelastine-Fluticasone (DYMISTA) 137-50 MCG/ACT SUSP Place 2 sprays into the nose daily. 1 Bottle 5   budesonide-formoterol (SYMBICORT) 160-4.5 MCG/ACT inhaler INHALE 2 PUFFS INTO LUNGS TWICE DAILY 10.2 Inhaler 5   diltiazem (CARTIA XT) 240 MG 24 hr capsule Take 1 capsule (240 mg total) by mouth daily. 90 capsule 2   hydrochlorothiazide (MICROZIDE) 12.5 MG capsule TAKE 2 CAPSULES(25 MG) BY MOUTH DAILY 180 capsule 3   ipratropium-albuterol (DUONEB) 0.5-2.5 (3) MG/3ML SOLN Take 3 mLs by nebulization 2 (two) times daily as needed (wheeze and shortness of breath). 360 mL 0   KLOR-CON M20 20 MEQ tablet TAKE 1 TABLET BY MOUTH EVERY DAY 90 tablet 3   Tiotropium Bromide Monohydrate (SPIRIVA RESPIMAT) 2.5 MCG/ACT AERS Inhale 2 puffs into the lungs daily. 4 g 4   No current facility-administered medications on file prior to visit.    No Known Allergies Social History   Socioeconomic History   Marital status: Married    Spouse name: Not on file   Number of children: 1   Years of education: Not on file   Highest education level: Not on file  Occupational History   Not on file  Social Needs   Financial resource strain: Not on file   Food insecurity    Worry: Not on file    Inability: Not on file   Transportation needs    Medical: Not on file    Non-medical: Not on file  Tobacco Use   Smoking status: Former Smoker    Packs/day: 0.75    Years: 15.00    Pack years: 11.25    Types:  Cigarettes    Quit date: 03/20/2014    Years since quitting: 4.9   Smokeless tobacco: Former Systems developer    Quit date: 03/11/2014  Substance and Sexual Activity   Alcohol use: No    Alcohol/week: 0.0 standard drinks   Drug use: No   Sexual activity: Not on file  Lifestyle   Physical activity  Days per week: Not on file    Minutes per session: Not on file   Stress: Not on file  Relationships   Social connections    Talks on phone: Not on file    Gets together: Not on file    Attends religious service: Not on file    Active member of club or organization: Not on file    Attends meetings of clubs or organizations: Not on file    Relationship status: Not on file   Intimate partner violence    Fear of current or ex partner: Not on file    Emotionally abused: Not on file    Physically abused: Not on file    Forced sexual activity: Not on file  Other Topics Concern   Not on file  Social History Narrative   Not on file      Review of Systems  All other systems reviewed and are negative.      Objective:   Physical Exam Vitals signs reviewed.  Constitutional:      Appearance: She is well-developed.  HENT:     Nose: Nose normal.     Mouth/Throat:     Pharynx: No oropharyngeal exudate.  Neck:     Vascular: No JVD.  Cardiovascular:     Rate and Rhythm: Normal rate. Rhythm irregular.     Heart sounds: Normal heart sounds.  Pulmonary:     Effort: Pulmonary effort is normal. No respiratory distress.     Breath sounds: No stridor. Examination of the right-upper field reveals decreased breath sounds. Examination of the left-upper field reveals decreased breath sounds. Examination of the right-middle field reveals decreased breath sounds. Examination of the left-middle field reveals decreased breath sounds. Examination of the right-lower field reveals decreased breath sounds. Examination of the left-lower field reveals decreased breath sounds. Decreased breath sounds present.  No wheezing, rhonchi or rales.  Chest:     Chest wall: No tenderness.  Abdominal:     General: Bowel sounds are normal.     Palpations: Abdomen is soft.  Musculoskeletal:     Right lower leg: No edema.     Left lower leg: No edema.  Lymphadenopathy:     Cervical: No cervical adenopathy.          Assessment & Plan:  Leukocytosis, unspecified type - Plan: DG Chest 2 View, Urinalysis, Routine w reflex microscopic, CANCELED: Culture, blood (single) w Reflex to ID Panel  Weight loss - Plan: DG Chest 2 View, Urinalysis, Routine w reflex microscopic, CANCELED: Culture, blood (single) w Reflex to ID Panel  Type 2 diabetes mellitus without complication, without long-term current use of insulin (HCC)  Essential hypertension  I feel better now having talk with the patient.  I believe her leukocytosis is reactive secondary to her recent prednisone use.  I will obtain a urinalysis and a chest x-ray to rule out an occult infection however clinically the patient appears to be at her baseline.  If the labs are normal, I would recommend repeating a CBC in 2 weeks and it should be back to normal.  I believe her weight loss is most likely due to situational anxiety and depression.  Patient agrees.  I have recommended Ensure or Glucerna.  Diabetes appears adequately controlled with hemoglobin A1c of 7.1.  Her blood pressure today is acceptable at 144/88.  And her cholesterol is excellent.

## 2019-04-04 ENCOUNTER — Ambulatory Visit
Admission: RE | Admit: 2019-04-04 | Discharge: 2019-04-04 | Disposition: A | Discharge status: Home / Self Care | Payer: Medicare Other | Source: Ambulatory Visit | Attending: Family Medicine | Admitting: Family Medicine

## 2019-04-04 DIAGNOSIS — D72829 Elevated white blood cell count, unspecified: Secondary | ICD-10-CM

## 2019-04-04 DIAGNOSIS — R634 Abnormal weight loss: Secondary | ICD-10-CM

## 2019-04-10 ENCOUNTER — Other Ambulatory Visit: Payer: Self-pay

## 2019-04-10 ENCOUNTER — Encounter: Payer: Self-pay | Admitting: Adult Health

## 2019-04-10 ENCOUNTER — Telehealth: Payer: Self-pay | Admitting: Adult Health

## 2019-04-10 ENCOUNTER — Ambulatory Visit (INDEPENDENT_AMBULATORY_CARE_PROVIDER_SITE_OTHER): Payer: Medicare Other | Admitting: Adult Health

## 2019-04-10 DIAGNOSIS — J441 Chronic obstructive pulmonary disease with (acute) exacerbation: Secondary | ICD-10-CM

## 2019-04-10 DIAGNOSIS — J9611 Chronic respiratory failure with hypoxia: Secondary | ICD-10-CM

## 2019-04-10 MED ORDER — PREDNISONE 10 MG PO TABS: ORAL_TABLET | ORAL | 0 refills | Status: DC

## 2019-04-10 MED ORDER — AZITHROMYCIN 250 MG PO TABS: ORAL_TABLET | ORAL | 0 refills | Status: AC

## 2019-04-10 NOTE — Progress Notes (Signed)
Virtual Visit via Telephone Note  I connected with Diane Patterson on 04/10/19 at  3:00 PM EDT by telephone and verified that I am speaking with the correct person using two identifiers.  Location: Patient: Home Provider: Office   I discussed the limitations, risks, security and privacy concerns of performing an evaluation and management service by telephone and the availability of in person appointments. I also discussed with the patient that there may be a patient responsible charge related to this service. The patient expressed understanding and agreed to proceed.   History of Present Illness: 73 year old female former smoker followed for chronic obstructive asthma and gold 4 COPD  Today's televisit is an acute office visit.  Patient complains of 1 week of productive cough with thick mucus sinus drainage wheezing and shortness of breath.  She remains on Symbicort. Was tried on Spiriva last visit but did not help at all . Has duoneb but does not use it. Requests prednisone says it is the only thing that helps her breathing . Discussed potential complications from chronic steroid use. She is on oxygen at 2 L. Chest xray last week with PCP showed COPD changes .  She denies any chest pain orthopnea PND or increased leg swelling.  No fever.  No loss of taste or smell.  No known sick contacts.   Observations/Objective: PFT2009, shows very severe obstruction with an FEV1 of approximately 0.73 L, 27%.  2016 FEV1 32%.   Assessment and Plan: Acute COPD exacerbation.-Recurrent exacerbations.  Discussed potential complications of chronic steroid use.  On return visit would like to add flutter valve.  And consider possibly placing on azithromycin 3 days a week.  Will need to do EKG prior to beginning. No perceived benefit with Spiriva.  Will add DuoNeb twice daily.  Oxygen dependent respiratory failure-continue on oxygen at 2 L.  O2 saturation goals 88 to 90%  Plan  Patient Instructions   Zpack take as directed.  Prednisone 20mg  daily for 1 week then 10mg  daily for 1 week and 5mg  daily for 1 week and stop.  Continue on Symbicort 2 puffs Twice daily  , rinse after use  Begin Duoneb Twice daily  .   Continue on Oxygen 2lm  Follow up in 2 months with Dr. Lamonte Sakai  Or Johne Buckle NP and As needed   Please contact office for sooner follow up if symptoms do not improve or worsen or seek emergency care       Follow Up Instructions: Follow-up in 2 months with Dr. Lamonte Sakai and as needed Please contact office for sooner follow up if symptoms do not improve or worsen or seek emergency care     I discussed the assessment and treatment plan with the patient. The patient was provided an opportunity to ask questions and all were answered. The patient agreed with the plan and demonstrated an understanding of the instructions.   The patient was advised to call back or seek an in-person evaluation if the symptoms worsen or if the condition fails to improve as anticipated.  I provided 23  minutes of non-face-to-face time during this encounter.   Rexene Edison, NP

## 2019-04-10 NOTE — Telephone Encounter (Signed)
Called and spoke to patient. Patient stated she has been having head congestion and has been "choking on thick mucus" but denies coughing. Patient stated she has had increased shortness of breath. Patient requested telephone visit with Tammy, NP. Schedule patient for televisit, nothing further needed at this time.

## 2019-04-10 NOTE — Patient Instructions (Signed)
Zpack take as directed.  Prednisone 20mg  daily for 1 week then 10mg  daily for 1 week and 5mg  daily for 1 week and stop.  Continue on Symbicort 2 puffs Twice daily  , rinse after use  Begin Duoneb Twice daily  .   Continue on Oxygen 2lm  Follow up in 2 months with Dr. Lamonte Sakai  Or Parrett NP and As needed   Please contact office for sooner follow up if symptoms do not improve or worsen or seek emergency care

## 2019-04-13 ENCOUNTER — Telehealth: Payer: Self-pay | Admitting: Adult Health

## 2019-04-13 NOTE — Telephone Encounter (Signed)
Patient is now scheduled to see TP 12.30.2020 Will sign off

## 2019-04-13 NOTE — Telephone Encounter (Signed)
Patient had televisit with TP on 10.27.2020, needs follow up visit in Dec 2020 with RB or TP  Patient's home number is not working LMOM TCB x1 on spouse's cell phone @ [phone]

## 2019-04-24 ENCOUNTER — Other Ambulatory Visit: Payer: Self-pay | Admitting: Adult Health

## 2019-05-14 ENCOUNTER — Other Ambulatory Visit: Payer: Self-pay | Admitting: Cardiology

## 2019-05-22 ENCOUNTER — Other Ambulatory Visit: Payer: Self-pay | Admitting: Cardiology

## 2019-05-22 ENCOUNTER — Telehealth: Payer: Self-pay | Admitting: Cardiology

## 2019-05-22 NOTE — Telephone Encounter (Signed)
New Message      *STAT* If patient is at the pharmacy, call can be transferred to refill team.   1. Which medications need to be refilled? (please list name of each medication and dose if known) apixaban (ELIQUIS) 5 MG TABS tablet  2. Which pharmacy/location (including street and city if local pharmacy) is medication to be sent to? [id number] - [place], [place] - [address]  3. Do they need a 30 day or 90 day supply? 30 or 90

## 2019-05-22 NOTE — Telephone Encounter (Signed)
New Message    Pt c/o medication issue:  1. Name of Medication: Diltiazem 240 mg and Cartia 240 mg   2. How are you currently taking this medication (dosage and times per day)? Taking Cartia   3. Are you having a reaction (difficulty breathing--STAT)? No   4. What is your medication issue? Pt got Diltiazem from the pharmacy and is wondering if this is the same medication as Cartia  Please call

## 2019-05-22 NOTE — Telephone Encounter (Signed)
Spoke with patient - rx was filled for diltiazem cd 240 mg capsules.  Advised this is the correct medication and she should continue to take it.  She states understanding and thanked me for the call.

## 2019-05-22 NOTE — Telephone Encounter (Signed)
Follow Up  Patient is returning call, states that she receive just regular diltiazem vs the extended release capsule. Patient is wanting prescription for the extended release capsule called in. Please assist.

## 2019-05-22 NOTE — Telephone Encounter (Signed)
Refill in done, via direct pharmacy request in escripts.

## 2019-05-22 NOTE — Telephone Encounter (Signed)
Attempted to contact patient by phone to discuss her concerns.  Left message as long as her pharmacy filled her RX with a long acting form of Diltiazem it is fine to use in place of [place].  Advised if she has specific questions regarding what drug her pharmacy used, she should contact them.  Requested she call back if any further questions or concerns.

## 2019-05-22 NOTE — Telephone Encounter (Signed)
Pt last saw Dr Marlou Porch 10/24/18, last labs 03/05/19 Creat 0.86, age 72, weight 53.5kg, based on specified criteria pt is on appriate dosage of Eliquis 5mg  BID.  Will refill rx.

## 2019-05-23 ENCOUNTER — Telehealth: Payer: Self-pay | Admitting: Adult Health

## 2019-05-23 NOTE — Telephone Encounter (Signed)
ATC pt, line rang several times then went to a busy signal. °Will try back. °

## 2019-05-24 ENCOUNTER — Encounter: Payer: Self-pay | Admitting: Adult Health

## 2019-05-24 ENCOUNTER — Ambulatory Visit (INDEPENDENT_AMBULATORY_CARE_PROVIDER_SITE_OTHER): Payer: Medicare Other | Admitting: Adult Health

## 2019-05-24 DIAGNOSIS — J441 Chronic obstructive pulmonary disease with (acute) exacerbation: Secondary | ICD-10-CM

## 2019-05-24 DIAGNOSIS — J9611 Chronic respiratory failure with hypoxia: Secondary | ICD-10-CM | POA: Diagnosis not present

## 2019-05-24 MED ORDER — PREDNISONE 10 MG PO TABS: ORAL_TABLET | ORAL | 1 refills | Status: DC

## 2019-05-24 MED ORDER — IPRATROPIUM-ALBUTEROL 0.5-2.5 (3) MG/3ML IN SOLN: 3.0000 mL | Freq: Three times a day (TID) | RESPIRATORY_TRACT | 5 refills | Status: DC

## 2019-05-24 NOTE — Telephone Encounter (Signed)
ATC, NA and no option to leave msg, goes to busy signal

## 2019-05-24 NOTE — Telephone Encounter (Signed)
Spoke with the pt  She is requesting pred taper- having increased PND and cough  Televisit with TP at 11:30- refused video

## 2019-05-24 NOTE — Patient Instructions (Addendum)
Restart Prednisone 10mg  daily for 1 week and 5mg  daily -hold at this dose .   Continue on Symbicort 2 puffs Twice daily  , rinse after use  Increase Duoneb Three times a day  .  Begin Mucinex Twice daily  For cough/congestion As needed   Begin Flutter valve Twice daily  .  Continue on Oxygen 2lm  Follow up in 4 weeks with Dr. Lamonte Sakai  Or Lauree Yurick NP and As needed   Please contact office for sooner follow up if symptoms do not improve or worsen or seek emergency care

## 2019-05-24 NOTE — Progress Notes (Signed)
Virtual Visit via Telephone Note  I connected with Diane Patterson on 05/24/19 at 11:30 AM EST by telephone and verified that I am speaking with the correct person using two identifiers.  Location: Patient: Home Provider: Office    I discussed the limitations, risks, security and privacy concerns of performing an evaluation and management service by telephone and the availability of in person appointments. I also discussed with the patient that there may be a patient responsible charge related to this service. The patient expressed understanding and agreed to proceed.   History of Present Illness: 73 year old female former smoker followed for chronic obstructive asthma and severe COPD Today's televisit is a follow-up for COPD/asthma.  Patient remains on Symbicort twice daily.  Patient is prone to recurrent exacerbations.  Last visit she was given a Z-Pak and a prednisone burst.  She says she did get better symptoms improved but as soon as she comes off of prednisone she feels like her breathing does not stay is good.  She wants to go back on low-dose prednisone.  We discussed that prednisone has lots of side effects including bone loss, elevated blood sugars that can lead to diabetes, etc. patient was also started on DuoNeb twice daily.  She was continued on her oxygen at 2 L.  Patient said oxygen levels have been doing well.  She denies any chest pain orthopnea PND or increased leg swelling.  Chest x-ray October 2020 showed emphysematous changes with no acute process.  She said appetite is fair with no nausea vomiting or diarrhea   Observations/Objective: PFT2009, shows very severe obstruction with an FEV1 of approximately 0.73 L, 27%.  2016 FEV1 32%.  Assessment and Plan: Recurrent COPD exacerbation.  Patient has high symptom burden.  Patient has very low lung function with multiple comorbidities and deconditioning We will treat briefly with low-dose steroids.  Consider azithromycin 3  days a week on return. Increase DuoNeb to 3 times daily.  Begin flutter valve.  Chronic respiratory failure on oxygen-stable on 2 L no increased oxygen demands.  O2 saturation goal is greater than 88 to 90%  Plan  Patient Instructions  Restart Prednisone 10mg  daily for 1 week and 5mg  daily -hold at this dose .   Continue on Symbicort 2 puffs Twice daily  , rinse after use  Increase Duoneb Three times a day  .  Begin Mucinex Twice daily  For cough/congestion As needed   Begin Flutter valve Twice daily  .  Continue on Oxygen 2lm  Follow up in 4 weeks with Dr. Lamonte Sakai  Or Anivea Velasques NP and As needed   Please contact office for sooner follow up if symptoms do not improve or worsen or seek emergency care       Follow Up Instructions:   Follow-up in 4 weeks and as needed   I discussed the assessment and treatment plan with the patient. The patient was provided an opportunity to ask questions and all were answered. The patient agreed with the plan and demonstrated an understanding of the instructions.   The patient was advised to call back or seek an in-person evaluation if the symptoms worsen or if the condition fails to improve as anticipated.  I provided 24 minutes of non-face-to-face time during this encounter.   Rexene Edison, NP

## 2019-05-25 ENCOUNTER — Telehealth: Payer: Self-pay | Admitting: Adult Health

## 2019-05-25 MED ORDER — FLUTTER DEVI: 0 refills | Status: DC

## 2019-05-25 NOTE — Telephone Encounter (Signed)
Patient had televisit with Tammy NP on 12.10.2020 with recommendations to follow up in 4 weeks with Dr Lamonte Sakai.  Tammy would also like for patient to begin flutter valve twice daily and requested that flutter valve be mailed to patient.  LMOM TCB x1 to schedule follow up appt.  Also need to discuss with patient the cost of the flutter valve and obtain double consent for her signature on the [employer] consent form.

## 2019-05-25 NOTE — Addendum Note (Signed)
Addended by: Tery Sanfilippo R on: 05/25/2019 10:42 AM   Modules accepted: Orders

## 2019-05-25 NOTE — Telephone Encounter (Signed)
Patient returned call.  Appt scheduled with Dr Lamonte Sakai for 1.11.2020 @ 1330.  Discussed flutter valve with patient and the potential cost of $99.  Patient asked if this could wait until 2021 due to finances.  Flutter valve returned to stock, Rx cancelled.  Tammy NP is aware.  Nothing further needed; will sign off.

## 2019-06-05 ENCOUNTER — Ambulatory Visit (INDEPENDENT_AMBULATORY_CARE_PROVIDER_SITE_OTHER): Payer: Medicare Other | Admitting: Family Medicine

## 2019-06-05 ENCOUNTER — Other Ambulatory Visit: Payer: Self-pay

## 2019-06-05 ENCOUNTER — Encounter: Payer: Self-pay | Admitting: Family Medicine

## 2019-06-05 DIAGNOSIS — J441 Chronic obstructive pulmonary disease with (acute) exacerbation: Secondary | ICD-10-CM

## 2019-06-05 MED ORDER — SPIRIVA RESPIMAT 2.5 MCG/ACT IN AERS: 2.0000 | INHALATION_SPRAY | Freq: Every day | RESPIRATORY_TRACT | 4 refills | Status: DC

## 2019-06-05 MED ORDER — PREDNISONE 5 MG PO TABS: 10.0000 mg | ORAL_TABLET | Freq: Every day | ORAL | 0 refills | Status: DC

## 2019-06-05 MED ORDER — ALBUTEROL SULFATE HFA 108 (90 BASE) MCG/ACT IN AERS: 2.0000 | INHALATION_SPRAY | RESPIRATORY_TRACT | 3 refills | Status: DC | PRN

## 2019-06-05 NOTE — Progress Notes (Signed)
Subjective:    Patient ID: Diane Patterson, female    DOB: 05-Apr-1946, 73 y.o.   MRN: [id number]  Patient is here today requesting more prednisone.  She states that she is having more trouble breathing.  Recently pulmonology gradually weaned her down from 10 mg of prednisone to 5 mg of prednisone and then off of prednisone.  She just stopped the prednisone in the last 48 hours.  However she states that over the last several weeks she has noticed increasing shortness of breath.  She states that she is barely able to move around her house without becoming winded.  She is unable to wash dishes or perform household chores.  She denies any fever.  She denies any chest pain.  She denies any purulent sputum or pleurisy.  She does report wheezing.  She is having to use her albuterol every 4-6 hours.  She is also taking Symbicort.  However she is not using her Spiriva. Past Medical History:  Diagnosis Date  . Asthma   . Chronic airflow obstruction (HCC)   . Hypertension   . Tobacco abuse   . Venous insufficiency    Past Surgical History:  Procedure Laterality Date  . cataract    . NECK SURGERY    . no colonoscopy     "afraid to "; [facility] reviewed  . VESICOVAGINAL FISTULA CLOSURE W/ TAH     Current Outpatient Medications on File Prior to Visit  Medication Sig Dispense Refill  . acetaminophen (TYLENOL) 325 MG tablet Take 650 mg by mouth every 6 (six) hours as needed for mild pain.     Marland Kitchen albuterol (PROVENTIL) (2.5 MG/3ML) 0.083% nebulizer solution Take 3 mLs (2.5 mg total) by nebulization every 6 (six) hours as needed for wheezing or shortness of breath. 150 mL 1  . Azelastine-Fluticasone (DYMISTA) 137-50 MCG/ACT SUSP Place 2 sprays into the nose daily. 1 Bottle 5  . budesonide-formoterol (SYMBICORT) 160-4.5 MCG/ACT inhaler INHALE 2 PUFFS INTO LUNGS TWICE DAILY 10.2 Inhaler 5  . CARTIA XT 240 MG 24 hr capsule TAKE 1 CAPSULE(240 MG) BY MOUTH DAILY 90 capsule 1  . ELIQUIS 5 MG TABS tablet TAKE 1  TABLET BY MOUTH TWICE A DAY 60 tablet 6  . hydrochlorothiazide (MICROZIDE) 12.5 MG capsule TAKE 2 CAPSULES(25 MG) BY MOUTH DAILY 180 capsule 3  . ipratropium-albuterol (DUONEB) 0.5-2.5 (3) MG/3ML SOLN Take 3 mLs by nebulization 3 (three) times daily. Dx J44.9 270 mL 5  . KLOR-CON M20 20 MEQ tablet TAKE 1 TABLET BY MOUTH EVERY DAY 90 tablet 3  . Respiratory Therapy Supplies (FLUTTER) DEVI Use as directed. 1 each 0   No current facility-administered medications on file prior to visit.   No Known Allergies Social History   Socioeconomic History  . Marital status: Married    Spouse name: Not on file  . Number of children: 1  . Years of education: Not on file  . Highest education level: Not on file  Occupational History  . Not on file  Tobacco Use  . Smoking status: Former Smoker    Packs/day: 0.75    Years: 15.00    Pack years: 11.25    Types: Cigarettes    Quit date: 03/20/2014    Years since quitting: 5.2  . Smokeless tobacco: Former Systems developer    Quit date: 03/11/2014  Substance and Sexual Activity  . Alcohol use: No    Alcohol/week: 0.0 standard drinks  . Drug use: No  . Sexual activity: Not on file  Other Topics Concern  . Not on file  Social History Narrative  . Not on file   Social Determinants of Health   Financial Resource Strain:   . Difficulty of Paying Living Expenses: Not on file  Food Insecurity:   . Worried About Charity fundraiser in the Last Year: Not on file  . Ran Out of Food in the Last Year: Not on file  Transportation Needs:   . Lack of Transportation (Medical): Not on file  . Lack of Transportation (Non-Medical): Not on file  Physical Activity:   . Days of Exercise per Week: Not on file  . Minutes of Exercise per Session: Not on file  Stress:   . Feeling of Stress : Not on file  Social Connections:   . Frequency of Communication with Friends and Family: Not on file  . Frequency of Social Gatherings with Friends and Family: Not on file  . Attends  Religious Services: Not on file  . Active Member of Clubs or Organizations: Not on file  . Attends Archivist Meetings: Not on file  . Marital Status: Not on file  Intimate Partner Violence:   . Fear of Current or Ex-Partner: Not on file  . Emotionally Abused: Not on file  . Physically Abused: Not on file  . Sexually Abused: Not on file      Review of Systems  All other systems reviewed and are negative.      Objective:   Physical Exam Vitals reviewed.  Constitutional:      Appearance: She is well-developed.  HENT:     Nose: Nose normal.     Mouth/Throat:     Pharynx: No oropharyngeal exudate.  Neck:     Vascular: No JVD.  Cardiovascular:     Rate and Rhythm: Normal rate. Rhythm irregular.     Heart sounds: Normal heart sounds.  Pulmonary:     Effort: Pulmonary effort is normal. No respiratory distress.     Breath sounds: No stridor. Examination of the right-upper field reveals decreased breath sounds. Examination of the left-upper field reveals decreased breath sounds. Examination of the right-middle field reveals decreased breath sounds. Examination of the left-middle field reveals decreased breath sounds. Examination of the right-lower field reveals decreased breath sounds. Examination of the left-lower field reveals decreased breath sounds. Decreased breath sounds present. No wheezing, rhonchi or rales.  Chest:     Chest wall: No tenderness.  Abdominal:     General: Bowel sounds are normal.     Palpations: Abdomen is soft.  Musculoskeletal:     Right lower leg: No edema.     Left lower leg: No edema.  Lymphadenopathy:     Cervical: No cervical adenopathy.          Assessment & Plan:  COPD exacerbation ([place]) - Plan: albuterol (VENTOLIN HFA) 108 (90 Base) MCG/ACT inhaler, DISCONTINUED: albuterol (VENTOLIN HFA) 108 (90 Base) MCG/ACT inhaler  I explained to the patient the dangers aspect of long-term steroids.  I explained that it can cause  osteonecrosis of the joints, weight gain, exacerbate diabetes, weaken her immune system, increased risk of other infections, adrenal insufficiency, etc.  Therefore although she feels better on the medication, I am concerned that repeated and long-term use of steroids could hurt her in the long run.  That is why I encouraged her to be more adherent to her maintenance therapy.  I have recommended that she continue her Symbicort but that she resume her Spiriva 2 inhalations  a day.  I explained it is a long-acting bronchodilator that should help her breathing.  I will temporarily increase her prednisone to 10 mg a day and then once she is back in stable on her Spiriva I would like to wean her off of that over the next several weeks.  I believe that she will experience better exercise tolerance if she takes the Spiriva along with the Symbicort.  Patient is in agreement

## 2019-06-13 ENCOUNTER — Ambulatory Visit: Payer: Medicare Other | Admitting: Adult Health

## 2019-06-17 ENCOUNTER — Other Ambulatory Visit: Payer: Self-pay | Admitting: Family Medicine

## 2019-06-17 DIAGNOSIS — J441 Chronic obstructive pulmonary disease with (acute) exacerbation: Secondary | ICD-10-CM

## 2019-06-25 ENCOUNTER — Ambulatory Visit: Payer: Medicare Other | Admitting: Emergency Medicine

## 2019-06-29 ENCOUNTER — Other Ambulatory Visit: Payer: Self-pay | Admitting: Family Medicine

## 2019-07-06 ENCOUNTER — Encounter: Payer: Self-pay | Admitting: Family Medicine

## 2019-07-06 ENCOUNTER — Other Ambulatory Visit: Payer: Self-pay

## 2019-07-06 ENCOUNTER — Ambulatory Visit (INDEPENDENT_AMBULATORY_CARE_PROVIDER_SITE_OTHER): Payer: Medicare Other | Admitting: Family Medicine

## 2019-07-06 VITALS — BP 144/78 | HR 94 | Temp 96.5°F | Resp 16 | Ht 67.0 in | Wt 118.0 lb

## 2019-07-06 DIAGNOSIS — R55 Syncope and collapse: Secondary | ICD-10-CM

## 2019-07-06 NOTE — Progress Notes (Signed)
Subjective:    Patient ID: Diane Patterson, female    DOB: [birth year], 74 y.o.   MRN: [id number]  HPI  1 week ago, the patient was cooking dinner for her and her husband.  She carried his food to the table and then sat down to eat her meal.  As soon as she sat down, she states that everything started to go black.  She felt extremely lightheaded like she was going to pass out.  She became confused and disoriented.  Her husband called EMS.  She recalls very little until they arrived on the scene.  Apparently she lost consciousness.  She does report that she had urinary incontinence.  She denies any witnessed tonic-clonic seizure episode however symptoms did last several minutes.  She denies any chest pain.  She denies any shortness of breath.  She denies any residual lightheadedness or near syncope or syncope after that episode 1 month ago.  Today she is in normal sinus rhythm.  She has a past medical history of paroxysmal atrial fibrillation but she is in sinus rhythm today with a normal heart rate in the 70s.  Her blood pressure today is within normal range.  She denies any recent bleeding or bruising or blood loss.  She denies any nausea or vomiting or other symptoms that would lead to dehydration. Past Medical History:  Diagnosis Date  . Asthma   . Chronic airflow obstruction (HCC)   . Hypertension   . Tobacco abuse   . Venous insufficiency    Past Surgical History:  Procedure Laterality Date  . cataract    . NECK SURGERY    . no colonoscopy     "afraid to "; [facility] reviewed  . VESICOVAGINAL FISTULA CLOSURE W/ TAH     Current Outpatient Medications on File Prior to Visit  Medication Sig Dispense Refill  . acetaminophen (TYLENOL) 325 MG tablet Take 650 mg by mouth every 6 (six) hours as needed for mild pain.     Marland Kitchen albuterol (PROVENTIL) (2.5 MG/3ML) 0.083% nebulizer solution Take 3 mLs (2.5 mg total) by nebulization every 6 (six) hours as needed for wheezing or shortness of breath. 150  mL 1  . albuterol (VENTOLIN HFA) 108 (90 Base) MCG/ACT inhaler Inhale 2 puffs into the lungs every 4 (four) hours as needed for wheezing or shortness of breath. 18 g 3  . Azelastine-Fluticasone (DYMISTA) 137-50 MCG/ACT SUSP Place 2 sprays into the nose daily. 1 Bottle 5  . budesonide-formoterol (SYMBICORT) 160-4.5 MCG/ACT inhaler INHALE 2 PUFFS INTO LUNGS TWICE DAILY 10.2 Inhaler 5  . CARTIA XT 240 MG 24 hr capsule TAKE 1 CAPSULE(240 MG) BY MOUTH DAILY 90 capsule 1  . ELIQUIS 5 MG TABS tablet TAKE 1 TABLET BY MOUTH TWICE A DAY 60 tablet 6  . hydrochlorothiazide (MICROZIDE) 12.5 MG capsule TAKE 2 CAPSULES(25 MG) BY MOUTH DAILY 180 capsule 3  . ipratropium-albuterol (DUONEB) 0.5-2.5 (3) MG/3ML SOLN Take 3 mLs by nebulization 3 (three) times daily. Dx J44.9 270 mL 5  . KLOR-CON M20 20 MEQ tablet TAKE 1 TABLET BY MOUTH EVERY DAY 90 tablet 3  . Respiratory Therapy Supplies (FLUTTER) DEVI Use as directed. 1 each 0  . Tiotropium Bromide Monohydrate (SPIRIVA RESPIMAT) 2.5 MCG/ACT AERS Inhale 2 puffs into the lungs daily. 4 g 4  . predniSONE (DELTASONE) 5 MG tablet TAKE 2 TABLETS BY MOUTH EVERY DAY WITH BREAKFAST (Patient not taking: Reported on 07/06/2019) 60 tablet 0   No current facility-administered medications on file prior  to visit.   No Known Allergies Social History   Socioeconomic History  . Marital status: Married    Spouse name: Not on file  . Number of children: 1  . Years of education: Not on file  . Highest education level: Not on file  Occupational History  . Not on file  Tobacco Use  . Smoking status: Former Smoker    Packs/day: 0.75    Years: 15.00    Pack years: 11.25    Types: Cigarettes    Quit date: 03/20/2014    Years since quitting: 5.2  . Smokeless tobacco: Former Systems developer    Quit date: 03/11/2014  Substance and Sexual Activity  . Alcohol use: No    Alcohol/week: 0.0 standard drinks  . Drug use: No  . Sexual activity: Not on file  Other Topics Concern  . Not on file   Social History Narrative  . Not on file   Social Determinants of Health   Financial Resource Strain:   . Difficulty of Paying Living Expenses: Not on file  Food Insecurity:   . Worried About Charity fundraiser in the Last Year: Not on file  . Ran Out of Food in the Last Year: Not on file  Transportation Needs:   . Lack of Transportation (Medical): Not on file  . Lack of Transportation (Non-Medical): Not on file  Physical Activity:   . Days of Exercise per Week: Not on file  . Minutes of Exercise per Session: Not on file  Stress:   . Feeling of Stress : Not on file  Social Connections:   . Frequency of Communication with Friends and Family: Not on file  . Frequency of Social Gatherings with Friends and Family: Not on file  . Attends Religious Services: Not on file  . Active Member of Clubs or Organizations: Not on file  . Attends Archivist Meetings: Not on file  . Marital Status: Not on file  Intimate Partner Violence:   . Fear of Current or Ex-Partner: Not on file  . Emotionally Abused: Not on file  . Physically Abused: Not on file  . Sexually Abused: Not on file      Review of Systems  All other systems reviewed and are negative.      Objective:   Physical Exam Vitals reviewed.  Constitutional:      Appearance: She is well-developed.  HENT:     Nose: Nose normal.     Mouth/Throat:     Pharynx: No oropharyngeal exudate.  Neck:     Vascular: No JVD.  Cardiovascular:     Rate and Rhythm: Normal rate and regular rhythm.     Heart sounds: Normal heart sounds.  Pulmonary:     Effort: Pulmonary effort is normal. No respiratory distress.     Breath sounds: Decreased breath sounds present. No wheezing or rales.  Abdominal:     General: Bowel sounds are normal.     Palpations: Abdomen is soft.  Musculoskeletal:     Right lower leg: No edema.     Left lower leg: No edema.  Lymphadenopathy:     Cervical: No cervical adenopathy.           Assessment & Plan:  Syncope, unspecified syncope type - Plan: CBC with Differential/Platelet, COMPLETE METABOLIC PANEL WITH GFR  Patient is downplaying her symptoms.  However the patient had a syncopal episode.  She lost control of her bladder.  Symptoms lasted several minutes.  I am concerned  about cerebral hypoperfusion however now I am concerned about possible bradycardia or asystole as a potential cause.  Patient would be at high risk for sick sinus syndrome given her medical comorbidities.  I will arrange an urgent consultation with Dr. Marlou Porch as I believe the patient warrants an event monitor to evaluate to determine if there is an ongoing cardiac arrhythmia that could be playing a role in this.  I will check a CBC to rule out severe anemia and I will also check a CMP to evaluate for any electrolyte disturbances that could have played a role.  Today she is in normal sinus rhythm and her blood pressure and heart rate are within normal range so I see no changes that need to be made to her medications.  Await the results of her cardiology consultation.

## 2019-07-07 ENCOUNTER — Other Ambulatory Visit: Payer: Self-pay | Admitting: Family Medicine

## 2019-07-07 DIAGNOSIS — J441 Chronic obstructive pulmonary disease with (acute) exacerbation: Secondary | ICD-10-CM

## 2019-07-07 LAB — COMPLETE METABOLIC PANEL WITH GFR
AG Ratio: 1.5 (calc) (ref 1.0–2.5)
ALT: 11 U/L (ref 6–29)
AST: 17 U/L (ref 10–35)
Albumin: 4 g/dL (ref 3.6–5.1)
Alkaline phosphatase (APISO): 87 U/L (ref 37–153)
BUN: 19 mg/dL (ref 7–25)
CO2: 27 mmol/L (ref 20–32)
Calcium: 9.6 mg/dL (ref 8.6–10.4)
Chloride: 104 mmol/L (ref 98–110)
Creat: 0.88 mg/dL (ref 0.60–0.93)
GFR, Est African American: 75 mL/min/{1.73_m2} (ref >=60)
GFR, Est Non African American: 65 mL/min/{1.73_m2} (ref >=60)
Globulin: 2.6 g/dL (calc) (ref 1.9–3.7)
Glucose, Bld: 111 mg/dL — ABNORMAL HIGH (ref 65–99)
Potassium: 4.2 mmol/L (ref 3.5–5.3)
Sodium: 142 mmol/L (ref 135–146)
Total Bilirubin: 0.4 mg/dL (ref 0.2–1.2)
Total Protein: 6.6 g/dL (ref 6.1–8.1)

## 2019-07-07 LAB — CBC WITH DIFFERENTIAL/PLATELET
Absolute Monocytes: 776 cells/uL (ref 200–950)
Basophils Absolute: 68 cells/uL (ref 0–200)
Basophils Relative: 0.7 %
Eosinophils Absolute: 359 cells/uL (ref 15–500)
Eosinophils Relative: 3.7 %
HCT: 34.8 % — ABNORMAL LOW (ref 35.0–45.0)
Hemoglobin: 11.3 g/dL — ABNORMAL LOW (ref 11.7–15.5)
Lymphs Abs: 1833 cells/uL (ref 850–3900)
MCH: 28.4 pg (ref 27.0–33.0)
MCHC: 32.5 g/dL (ref 32.0–36.0)
MCV: 87.4 fL (ref 80.0–100.0)
MPV: 11.1 fL (ref 7.5–12.5)
Monocytes Relative: 8 %
Neutro Abs: 6664 cells/uL (ref 1500–7800)
Neutrophils Relative %: 68.7 %
Platelets: 329 10*3/uL (ref 140–400)
RBC: 3.98 10*6/uL (ref 3.80–5.10)
RDW: 12.8 % (ref 11.0–15.0)
Total Lymphocyte: 18.9 %
WBC: 9.7 10*3/uL (ref 3.8–10.8)

## 2019-07-17 ENCOUNTER — Ambulatory Visit: Payer: Medicare Other | Admitting: Cardiology

## 2019-07-31 ENCOUNTER — Encounter: Payer: Self-pay | Admitting: Family Medicine

## 2019-09-06 ENCOUNTER — Other Ambulatory Visit: Payer: Self-pay

## 2019-09-06 ENCOUNTER — Ambulatory Visit (INDEPENDENT_AMBULATORY_CARE_PROVIDER_SITE_OTHER): Payer: Medicare Other | Admitting: Nurse Practitioner

## 2019-09-06 DIAGNOSIS — J441 Chronic obstructive pulmonary disease with (acute) exacerbation: Secondary | ICD-10-CM | POA: Diagnosis not present

## 2019-09-06 MED ORDER — AZITHROMYCIN 250 MG PO TABS: ORAL_TABLET | ORAL | 0 refills | Status: DC

## 2019-09-06 MED ORDER — PREDNISONE 10 MG PO TABS: ORAL_TABLET | ORAL | 0 refills | Status: DC

## 2019-09-06 NOTE — Telephone Encounter (Signed)
10/28/18.Marland Kitchenreceived cancellation notification from [employer].. No baseline.. monitor returned 01/25/19.

## 2019-09-06 NOTE — Progress Notes (Addendum)
Telephone Note    I connected withMarjorie K. Patterson on 09/06/2019 at 1130 by telephoneand verified that I am speaking with the correct person using two identifiers.  Pt location: at home   Physician location:  In office, [place], Ishmael Holter, FNP-C    On call: patient and physician   I discussed the limitations, risks, security and privacy concerns of performing an evaluation and management service by telephone and the availability of in person appointments. I also discussed with the patient that there may be a patient responsible charge related to this service. The patient expressed understanding and agreed to proceed.   History of Present Illness: Pt is a 74 year old female with H/O COPD, having renovation done in her home with dust, chest tight with respirations as with prior COPD exacerbation. Denied fever/chills, gu/gi sxs, pain, general body aches, nasal congestion or drainage. She continues to use her usual COPD managing medications. No other tx tried. She reports she cannot take mucinex as it raises her blood pressure. When offered Flonase she reported she does have prescribed nasal spray at home that she has not been using from allergist called [facility].    Observations/Objective: NAD noted, able to speak in full sentences, no audible wheezing heard on phone, pt a/ox3, does not seem to be in acute distress.  Assessment and Plan: Sxs are consistent with COPD exacerbation today. For additional sxs, change in sxs, worsening or non resolving please seek medical attention.  Start taking the [employer] (azelastine hydrochloride and fluticasone propionate) nasal spray again. Continue using your COPD managing medications Prednisone and antibiotic electronically prescribed today.  Education provided and uploaded to mychart sheet on Chronic Obstructive Pulmonary Disease Exacerbation   Follow Up Instructions:  For additional sxs, change in sxs, worsening or  non resolving please seek medical attention.   I discussed the assessment and treatment plan with the patient. The patient was provided an opportunity to ask questions and all were answered. The patient agreed with the plan and demonstrated an understanding of the instructions.  The patient was advised to call back or seek an in-person evaluation if the symptoms worsen or if the condition fails to improve as anticipated.  I provided 10 minutes of non-face-to-face time during this encounter. End Time [place], FNP-C

## 2019-09-13 ENCOUNTER — Ambulatory Visit: Payer: Medicare Other | Attending: Internal Medicine

## 2019-09-13 DIAGNOSIS — Z23 Encounter for immunization: Secondary | ICD-10-CM

## 2019-09-13 NOTE — Progress Notes (Signed)
   Covid-19 Vaccination Clinic  Name:  YASMINE SEGLER    MRN: [id number] DOB: 03-Dec-1945  09/13/2019  Ms. Rockholt was observed post Covid-19 immunization for 15 minutes without incident. She was provided with Vaccine Information Sheet and instruction to access the V-Safe system.   Ms. Boisselle was instructed to call 911 with any severe reactions post vaccine: Marland Kitchen Difficulty breathing  . Swelling of face and throat  . A fast heartbeat  . A bad rash all over body  . Dizziness and weakness   Immunizations Administered    Name Date Dose VIS Date Route   Moderna COVID-19 Vaccine 09/13/2019 10:41 AM 0.5 mL 05/15/2019 Intramuscular   Manufacturer: Moderna   Lot: [id number]   [id number]

## 2019-09-19 ENCOUNTER — Other Ambulatory Visit: Payer: Self-pay

## 2019-09-19 ENCOUNTER — Ambulatory Visit (INDEPENDENT_AMBULATORY_CARE_PROVIDER_SITE_OTHER): Payer: Medicare Other | Admitting: Cardiology

## 2019-09-19 ENCOUNTER — Encounter: Payer: Self-pay | Admitting: Cardiology

## 2019-09-19 VITALS — BP 124/64 | HR 65 | Ht 67.0 in | Wt 123.0 lb

## 2019-09-19 DIAGNOSIS — R0602 Shortness of breath: Secondary | ICD-10-CM | POA: Diagnosis not present

## 2019-09-19 DIAGNOSIS — I480 Paroxysmal atrial fibrillation: Secondary | ICD-10-CM | POA: Diagnosis not present

## 2019-09-19 DIAGNOSIS — I10 Essential (primary) hypertension: Secondary | ICD-10-CM | POA: Diagnosis not present

## 2019-09-19 MED ORDER — POTASSIUM CHLORIDE CRYS ER 20 MEQ PO TBCR: 20.0000 meq | EXTENDED_RELEASE_TABLET | Freq: Every day | ORAL | 3 refills | Status: DC

## 2019-09-19 MED ORDER — DILTIAZEM HCL ER COATED BEADS 240 MG PO CP24: ORAL_CAPSULE | ORAL | 1 refills | Status: DC

## 2019-09-19 MED ORDER — HYDROCHLOROTHIAZIDE 12.5 MG PO CAPS: ORAL_CAPSULE | ORAL | 3 refills | Status: DC

## 2019-09-19 MED ORDER — APIXABAN 5 MG PO TABS: 5.0000 mg | ORAL_TABLET | Freq: Two times a day (BID) | ORAL | 3 refills | Status: DC

## 2019-09-19 NOTE — Progress Notes (Signed)
Cardiology Office Note:    Date:  09/19/2019   ID:  Diane Patterson, DOB 01/28/1946, MRN [id number]  PCP:  Susy Frizzle, MD  Cardiologist:  Candee Furbish, MD  Electrophysiologist:  None   Referring MD: Susy Frizzle, MD     History of Present Illness:    Diane Patterson is a 74 y.o. female with a hx of near syncopal episode.  In review of Dr. Samella Parr note from 10/23/2018, yesterday, there was concern over possible cardiac arrhythmia or hypotension secondary to medication.  It was recommended that she discontinue her losartan completely.  She is going to buy blood pressure cuff and start checking her blood pressures.  If she feels weak or lightheaded check and see what her blood pressure is.  There was some concern that she may have transient bradycardia or tachycardia.  We have set her up for an event monitor.  Blood work was drawn and was reassuring. Just had come off ABX and prednisone.   She was having trouble with breathing, EMS came. BP was not right. No Fever. BP was low.   Week before, pain in stomach going up. Cough hard, chunk came up and fine. Felt wobbly with walking.  Requested an in office visit.  Creatinine 0.77 potassium 4.3 glucose 121 LDL 98 hemoglobin 11.6  Current EKG shows normal sinus rhythm 62 with nonspecific ST-T wave changes.  Prior EKG in 2017 showed similar findings.  Previous nuclear stress test and prior event monitor was reassuring.  Shortness of breath seem to be COPD related.  No evidence of ischemia.  Prior episode of atrial fibrillation with rapid ventricular response in February 2019.  Continuing with Eliquis.  Her husband had a previous bleed on Xarelto requiring 4 units of blood.  Being very careful with her hemoglobin monitoring  09/19/19  -Here for the follow-up of prior near syncope.  Overall she has been feeling quite well with no further episodes.  Remember, we stopped the losartan previously.  Overall she is doing well.   No significant issues.  She does have continuous shortness of breath from her COPD at times.  Is hard for her to cook for instance with oxygen.  Past Medical History:  Diagnosis Date  . Asthma   . Chronic airflow obstruction (HCC)   . Hypertension   . Tobacco abuse   . Venous insufficiency     Past Surgical History:  Procedure Laterality Date  . cataract    . NECK SURGERY    . no colonoscopy     "afraid to "; [facility] reviewed  . VESICOVAGINAL FISTULA CLOSURE W/ TAH      Current Medications: Current Meds  Medication Sig  . acetaminophen (TYLENOL) 325 MG tablet Take 650 mg by mouth every 6 (six) hours as needed for mild pain.   Marland Kitchen albuterol (PROVENTIL) (2.5 MG/3ML) 0.083% nebulizer solution Take 3 mLs (2.5 mg total) by nebulization every 6 (six) hours as needed for wheezing or shortness of breath.  Marland Kitchen albuterol (VENTOLIN HFA) 108 (90 Base) MCG/ACT inhaler Inhale 2 puffs into the lungs every 4 (four) hours as needed for wheezing or shortness of breath.  Marland Kitchen apixaban (ELIQUIS) 5 MG TABS tablet Take 1 tablet (5 mg total) by mouth 2 (two) times daily.  . budesonide-formoterol (SYMBICORT) 160-4.5 MCG/ACT inhaler INHALE 2 PUFFS INTO LUNGS TWICE DAILY  . diltiazem (CARTIA XT) 240 MG 24 hr capsule TAKE 1 CAPSULE(240 MG) BY MOUTH DAILY  . hydrochlorothiazide (MICROZIDE) 12.5 MG capsule TAKE  2 CAPSULES(25 MG) BY MOUTH DAILY  . ipratropium-albuterol (DUONEB) 0.5-2.5 (3) MG/3ML SOLN Take 3 mLs by nebulization 3 (three) times daily. Dx J44.9  . potassium chloride SA (KLOR-CON M20) 20 MEQ tablet Take 1 tablet (20 mEq total) by mouth daily.  . predniSONE (DELTASONE) 10 MG tablet Take 10 mg by mouth daily.  Marland Kitchen Respiratory Therapy Supplies (FLUTTER) DEVI Use as directed.  . Tiotropium Bromide Monohydrate (SPIRIVA RESPIMAT) 2.5 MCG/ACT AERS Inhale 2 puffs into the lungs daily.  . [DISCONTINUED] CARTIA XT 240 MG 24 hr capsule TAKE 1 CAPSULE(240 MG) BY MOUTH DAILY  . [DISCONTINUED] ELIQUIS 5 MG TABS tablet TAKE  1 TABLET BY MOUTH TWICE A DAY  . [DISCONTINUED] hydrochlorothiazide (MICROZIDE) 12.5 MG capsule TAKE 2 CAPSULES(25 MG) BY MOUTH DAILY  . [DISCONTINUED] KLOR-CON M20 20 MEQ tablet TAKE 1 TABLET BY MOUTH EVERY DAY     Allergies:   Patient has no known allergies.   Social History   Socioeconomic History  . Marital status: Married    Spouse name: Not on file  . Number of children: 1  . Years of education: Not on file  . Highest education level: Not on file  Occupational History  . Not on file  Tobacco Use  . Smoking status: Former Smoker    Packs/day: 0.75    Years: 15.00    Pack years: 11.25    Types: Cigarettes    Quit date: 03/20/2014    Years since quitting: 5.5  . Smokeless tobacco: Former Systems developer    Quit date: 03/11/2014  Substance and Sexual Activity  . Alcohol use: No    Alcohol/week: 0.0 standard drinks  . Drug use: No  . Sexual activity: Not on file  Other Topics Concern  . Not on file  Social History Narrative  . Not on file   Social Determinants of Health   Financial Resource Strain:   . Difficulty of Paying Living Expenses:   Food Insecurity:   . Worried About Charity fundraiser in the Last Year:   . Arboriculturist in the Last Year:   Transportation Needs:   . Film/video editor (Medical):   Marland Kitchen Lack of Transportation (Non-Medical):   Physical Activity:   . Days of Exercise per Week:   . Minutes of Exercise per Session:   Stress:   . Feeling of Stress :   Social Connections:   . Frequency of Communication with Friends and Family:   . Frequency of Social Gatherings with Friends and Family:   . Attends Religious Services:   . Active Member of Clubs or Organizations:   . Attends Archivist Meetings:   Marland Kitchen Marital Status:      Family History: The patient's family history includes Asthma in her father; Diabetes in her mother; Emphysema in her father; Heart disease in her father.  ROS:   Please see the history of present illness.     All  other systems reviewed and are negative.  EKGs/Labs/Other Studies Reviewed:    The following studies were reviewed today: NUC stress: 08/12/15: 1. This study was count-poor at rest and with Lexiscan stress. There is a fixed, small, mild apical perfusion defect with normal wall motion that is likely artifact. No definite ischemia or infarction.  2. Normal EF and wall motion.  3. Low risk study.   Reassuring  Event monitor 08/12/15: No adverse rhythms.  EKG:  EKG is  ordered today.  The ekg ordered today demonstrates sinus rhythm  65 with no other abnormalities, left axis deviation noted.  Recent Labs: 07/06/2019: ALT 11; BUN 19; Creat 0.88; Hemoglobin 11.3; Platelets 329; Potassium 4.2; Sodium 142  Recent Lipid Panel    Component Value Date/Time   CHOL 197 03/05/2019 0904   TRIG 126 03/05/2019 0904   HDL 86 03/05/2019 0904   CHOLHDL 2.3 03/05/2019 0904   VLDL 27.6 02/10/2010 1017   LDLCALC 88 03/05/2019 0904   LDLDIRECT 136.0 02/10/2010 1017    Physical Exam:    VS:  BP 124/64   Pulse 65   Ht 5\' 7"  (1.702 m)   Wt 123 lb (55.8 kg)   LMP  (LMP Unknown)   SpO2 97%   BMI 19.26 kg/m     Wt Readings from Last 3 Encounters:  09/19/19 123 lb (55.8 kg)  07/06/19 118 lb (53.5 kg)  06/05/19 122 lb (55.3 kg)     GEN:  Thin in no acute distress HEENT: Normal NECK: No JVD; No carotid bruits LYMPHATICS: No lymphadenopathy CARDIAC: RRR, no murmurs, rubs, gallops RESPIRATORY:  Clear to auscultation without rales, wheezing or rhonchi  ABDOMEN: Soft, non-tender, non-distended MUSCULOSKELETAL:  No edema; No deformity  SKIN: Warm and dry NEUROLOGIC:  Alert and oriented x 3 PSYCHIATRIC:  Normal affect   ASSESSMENT:    1. PAF (paroxysmal atrial fibrillation) ([place])   2. Essential hypertension   3. Shortness of breath    PLAN:    In order of problems listed above:  Near syncope -Previously stopped the losartan. Event monitor was overall reassuring with no evidence of  bradycardia.  Paroxysmal atrial fibrillation -EKG today shows sinus rhythm. Prior episode in 2019 of A. fib. Continuing with Eliquis. No bleeding.Brusing. Hg 11.3, Creat 0.8  COPD -Dr. Lamonte Sakai has been monitoring.  Essential hypertension -Off losartan. Willing to tolerate a higher than normal blood pressure given her near syncopal episodes in the past.   Medication Adjustments/Labs and Tests Ordered: Current medicines are reviewed at length with the patient today.  Concerns regarding medicines are outlined above.  No orders of the defined types were placed in this encounter.  Meds ordered this encounter  Medications  . diltiazem (CARTIA XT) 240 MG 24 hr capsule    Sig: TAKE 1 CAPSULE(240 MG) BY MOUTH DAILY    Dispense:  90 capsule    Refill:  1  . apixaban (ELIQUIS) 5 MG TABS tablet    Sig: Take 1 tablet (5 mg total) by mouth 2 (two) times daily.    Dispense:  180 tablet    Refill:  3  . hydrochlorothiazide (MICROZIDE) 12.5 MG capsule    Sig: TAKE 2 CAPSULES(25 MG) BY MOUTH DAILY    Dispense:  180 capsule    Refill:  3  . potassium chloride SA (KLOR-CON M20) 20 MEQ tablet    Sig: Take 1 tablet (20 mEq total) by mouth daily.    Dispense:  90 tablet    Refill:  3    Patient Instructions  Medication Instructions:  Your physician recommends that you continue on your current medications as directed. Please refer to the Current Medication list given to you today.  *If you need a refill on your cardiac medications before your next appointment, please call your pharmacy*  Follow-Up: At [facility], you and your health needs are our priority.  As part of our continuing mission to provide you with exceptional heart care, we have created designated Provider Care Teams.  These Care Teams include your primary Cardiologist (physician) and  Advanced Practice Providers (APPs -  Physician Assistants and Nurse Practitioners) who all work together to provide you with the care you need, when  you need it.  We recommend signing up for the patient portal called "MyChart".  Sign up information is provided on this After Visit Summary.  MyChart is used to connect with patients for Virtual Visits (Telemedicine).  Patients are able to view lab/test results, encounter notes, upcoming appointments, etc.  Non-urgent messages can be sent to your provider as well.   To learn more about what you can do with MyChart, go to [email or web address].    Your next appointment:   6 month(s)  The format for your next appointment:   In Person  Provider:   Cecilie Kicks, NP  Follow up with Dr. Marlou Porch in one year.   You will receive a reminder letter in the mail two months in advance. If you don't receive a letter, please call our office to schedule the follow-up appointment.      Signed, Candee Furbish, MD  09/19/2019 3:50 PM    [facility]

## 2019-09-19 NOTE — Patient Instructions (Signed)
Medication Instructions:  Your physician recommends that you continue on your current medications as directed. Please refer to the Current Medication list given to you today.  *If you need a refill on your cardiac medications before your next appointment, please call your pharmacy*  Follow-Up: At [facility], you and your health needs are our priority.  As part of our continuing mission to provide you with exceptional heart care, we have created designated Provider Care Teams.  These Care Teams include your primary Cardiologist (physician) and Advanced Practice Providers (APPs -  Physician Assistants and Nurse Practitioners) who all work together to provide you with the care you need, when you need it.  We recommend signing up for the patient portal called "MyChart".  Sign up information is provided on this After Visit Summary.  MyChart is used to connect with patients for Virtual Visits (Telemedicine).  Patients are able to view lab/test results, encounter notes, upcoming appointments, etc.  Non-urgent messages can be sent to your provider as well.   To learn more about what you can do with MyChart, go to [email or web address].    Your next appointment:   6 month(s)  The format for your next appointment:   In Person  Provider:   Cecilie Kicks, NP  Follow up with Dr. Marlou Porch in one year.   You will receive a reminder letter in the mail two months in advance. If you don't receive a letter, please call our office to schedule the follow-up appointment.

## 2019-10-04 ENCOUNTER — Ambulatory Visit (INDEPENDENT_AMBULATORY_CARE_PROVIDER_SITE_OTHER): Payer: Medicare Other | Admitting: Adult Health

## 2019-10-04 ENCOUNTER — Telehealth: Payer: Self-pay | Admitting: Adult Health

## 2019-10-04 ENCOUNTER — Other Ambulatory Visit: Payer: Self-pay

## 2019-10-04 ENCOUNTER — Encounter: Payer: Self-pay | Admitting: Adult Health

## 2019-10-04 DIAGNOSIS — J441 Chronic obstructive pulmonary disease with (acute) exacerbation: Secondary | ICD-10-CM

## 2019-10-04 DIAGNOSIS — Z7951 Long term (current) use of inhaled steroids: Secondary | ICD-10-CM

## 2019-10-04 DIAGNOSIS — J9611 Chronic respiratory failure with hypoxia: Secondary | ICD-10-CM

## 2019-10-04 DIAGNOSIS — Z87891 Personal history of nicotine dependence: Secondary | ICD-10-CM | POA: Diagnosis not present

## 2019-10-04 DIAGNOSIS — Z9981 Dependence on supplemental oxygen: Secondary | ICD-10-CM

## 2019-10-04 MED ORDER — AZITHROMYCIN 250 MG PO TABS: ORAL_TABLET | ORAL | 0 refills | Status: AC

## 2019-10-04 MED ORDER — PREDNISONE 20 MG PO TABS: 20.0000 mg | ORAL_TABLET | Freq: Every day | ORAL | 0 refills | Status: DC

## 2019-10-04 NOTE — Telephone Encounter (Signed)
Patient has been added to TP's schedule at 430 as requested.

## 2019-10-04 NOTE — Progress Notes (Signed)
Virtual Visit via Telephone Note  I connected with Diane Patterson on 10/04/19 at  4:30 PM EDT by telephone and verified that I am speaking with the correct person using two identifiers.  Location: Patient: Home  Provider: Home office    I discussed the limitations, risks, security and privacy concerns of performing an evaluation and management service by telephone and the availability of in person appointments. I also discussed with the patient that there may be a patient responsible charge related to this service. The patient expressed understanding and agreed to proceed.   History of Present Illness: 74 year old female former smoker followed for chronic obstructive asthma and severe COPD.  Today's televisit is an acute office visit for COPD.  Patient is prone to recurrent exacerbations.  Patient was last seen December 2020.  Patient says over the last week she has had increased cough congestion sinus drainage postnasal drip.  Patient says they are having some house construction done in their home and the dust has been bad.  She complains that she is coughing up some thick mucus.  Has some intermittent wheezing and shortness of breath.  She denies any hemoptysis chest pain orthopnea PND or increased leg swelling.  She denies any loss of taste or smell.  Appetite is good with no nausea vomiting or diarrhea.  No known sick contacts.  She has received her first Covid vaccine and did well.  She has an upcoming Covid vaccine in 2 weeks.   Patient Active Problem List   Diagnosis Date Noted  . Diabetes mellitus type 2, uncomplicated ([place]) [id number]  . Atrial fibrillation with RVR ([place])   . AKI (acute kidney injury) ([place]) 07/11/2017  . Chronic respiratory failure with hypoxia ([place]) 11/15/2016  . Allergic rhinitis 10/14/2016  . Contusion 12/01/2015  . Abscess of umbilicus [id number]  . COPD exacerbation ([place]) 06/23/2015  . Right carotid bruit 08/19/2014  . Hyperlipidemia 08/19/2014  .  Prediabetes 08/19/2014  . Bruising 09/26/2013  . History of basal cell cancer 09/26/2013  . Oral candidiasis 01/10/2013  . Essential hypertension 12/21/2011  . COPD (chronic obstructive pulmonary disease) ([place]) 01/20/2011  . Constipation 06/24/2010  . ABDOMINAL BLOATING 06/24/2010  . ATHEROSCLEROSIS OF AORTA 02/10/2010  . FATTY LIVER DISEASE 02/10/2010  . Anxiety state 05/14/2008  . Former smoker 12/28/2006  . VENOUS INSUFFICIENCY 12/28/2006    Current Outpatient Medications on File Prior to Visit  Medication Sig Dispense Refill  . acetaminophen (TYLENOL) 325 MG tablet Take 650 mg by mouth every 6 (six) hours as needed for mild pain.     Marland Kitchen albuterol (PROVENTIL) (2.5 MG/3ML) 0.083% nebulizer solution Take 3 mLs (2.5 mg total) by nebulization every 6 (six) hours as needed for wheezing or shortness of breath. 150 mL 1  . albuterol (VENTOLIN HFA) 108 (90 Base) MCG/ACT inhaler Inhale 2 puffs into the lungs every 4 (four) hours as needed for wheezing or shortness of breath. 18 g 3  . apixaban (ELIQUIS) 5 MG TABS tablet Take 1 tablet (5 mg total) by mouth 2 (two) times daily. 180 tablet 3  . budesonide-formoterol (SYMBICORT) 160-4.5 MCG/ACT inhaler INHALE 2 PUFFS INTO LUNGS TWICE DAILY 10.2 Inhaler 5  . diltiazem (CARTIA XT) 240 MG 24 hr capsule TAKE 1 CAPSULE(240 MG) BY MOUTH DAILY 90 capsule 1  . hydrochlorothiazide (MICROZIDE) 12.5 MG capsule TAKE 2 CAPSULES(25 MG) BY MOUTH DAILY 180 capsule 3  . ipratropium-albuterol (DUONEB) 0.5-2.5 (3) MG/3ML SOLN Take 3 mLs by nebulization 3 (three) times daily. Dx J44.9 270  mL 5  . potassium chloride SA (KLOR-CON M20) 20 MEQ tablet Take 1 tablet (20 mEq total) by mouth daily. 90 tablet 3  . Respiratory Therapy Supplies (FLUTTER) DEVI Use as directed. 1 each 0  . Tiotropium Bromide Monohydrate (SPIRIVA RESPIMAT) 2.5 MCG/ACT AERS Inhale 2 puffs into the lungs daily. 4 g 4   No current facility-administered medications on file prior to visit.      Observations/Objective: Patient speaks in full sentences with no audible distress or wheezing. Says O2 saturations have been well at home.  Assessment and Plan: Acute COPD exacerbation.  Patient is prone to recurrent flare.  Will reassess on follow-up visit regarding additional maintenance regimen. Treat with short course of prednisone.  And an antibiotic.  Use her pulmonary hygiene regimen.  Chronic hypoxic respiratory failure continue on oxygen at 2 L.  No increased oxygen demands.  Plan  Patient Instructions  Zpack take as directed.  Prednisone 20mg  daily for 5 days  Continue on Symbicort 2 puffs Twice daily  , rinse after use  Duoneb. As needed   Continue on Oxygen 2lm  Follow up in 4 weeks  with Dr. Lamonte Sakai  Or Cyndie Woodbeck NP and As needed   Please contact office for sooner follow up if symptoms do not improve or worsen or seek emergency care       Follow Up Instructions: Follow-up in 4 weeks and as needed   I discussed the assessment and treatment plan with the patient. The patient was provided an opportunity to ask questions and all were answered. The patient agreed with the plan and demonstrated an understanding of the instructions.   The patient was advised to call back or seek an in-person evaluation if the symptoms worsen or if the condition fails to improve as anticipated.  I provided 23 minutes of non-face-to-face time during this encounter.   Rexene Edison, NP

## 2019-10-04 NOTE — Telephone Encounter (Signed)
Can Triage add this patient to my schedule today at 430 for televisit . She is sick visit .Had visit with Husband this am and she wants to be seen for same .   Thanks

## 2019-10-04 NOTE — Patient Instructions (Signed)
Zpack take as directed.  Prednisone 20mg  daily for 5 days  Continue on Symbicort 2 puffs Twice daily  , rinse after use  Duoneb. As needed   Continue on Oxygen 2lm  Follow up in 4 weeks  with Dr. Lamonte Sakai  Or Roselene Gray NP and As needed   Please contact office for sooner follow up if symptoms do not improve or worsen or seek emergency care

## 2019-10-09 NOTE — Addendum Note (Signed)
Addended by: Maren Beach, Raahil Ong A on: 10/09/2019 04:31 PM   Modules accepted: Orders

## 2019-10-16 ENCOUNTER — Ambulatory Visit: Payer: Medicare Other | Attending: Internal Medicine

## 2019-10-16 DIAGNOSIS — Z23 Encounter for immunization: Secondary | ICD-10-CM

## 2019-10-16 NOTE — Progress Notes (Signed)
   Covid-19 Vaccination Clinic  Name:  Diane Patterson    MRN: [id number] DOB: [birth year]  10/16/2019  Diane Patterson was observed post Covid-19 immunization for 15 minutes without incident. She was provided with Vaccine Information Sheet and instruction to access the V-Safe system.   Diane Patterson was instructed to call 911 with any severe reactions post vaccine: Marland Kitchen Difficulty breathing  . Swelling of face and throat  . A fast heartbeat  . A bad rash all over body  . Dizziness and weakness   Immunizations Administered    Name Date Dose VIS Date Route   Moderna COVID-19 Vaccine 10/16/2019 11:34 AM 0.5 mL 05/2019 Intramuscular   Manufacturer: Moderna   Lot: [id number]   [id number]

## 2019-10-19 ENCOUNTER — Other Ambulatory Visit: Payer: Self-pay | Admitting: Family Medicine

## 2019-10-19 ENCOUNTER — Telehealth: Payer: Self-pay | Admitting: Family Medicine

## 2019-10-19 MED ORDER — PREDNISONE 20 MG PO TABS: 60.0000 mg | ORAL_TABLET | Freq: Every day | ORAL | 0 refills | Status: DC

## 2019-10-19 NOTE — Telephone Encounter (Signed)
Pt called and states that she is having a COPD exacerbation and is out of Prednisone and wanted to know if you would send her in some to her pharmacy?

## 2019-10-19 NOTE — Telephone Encounter (Signed)
Pt aware via vm 

## 2019-10-19 NOTE — Telephone Encounter (Signed)
yes

## 2019-10-22 NOTE — Addendum Note (Signed)
Addended by: Janan Halter F on: 10/22/2019 01:31 PM   Modules accepted: Orders

## 2019-10-25 ENCOUNTER — Other Ambulatory Visit: Payer: Self-pay

## 2019-10-25 ENCOUNTER — Telehealth (INDEPENDENT_AMBULATORY_CARE_PROVIDER_SITE_OTHER): Payer: Medicare Other | Admitting: Family Medicine

## 2019-10-25 DIAGNOSIS — J441 Chronic obstructive pulmonary disease with (acute) exacerbation: Secondary | ICD-10-CM

## 2019-10-25 MED ORDER — AZITHROMYCIN 250 MG PO TABS: ORAL_TABLET | ORAL | 0 refills | Status: DC

## 2019-10-25 MED ORDER — PREDNISONE 20 MG PO TABS: 60.0000 mg | ORAL_TABLET | Freq: Every day | ORAL | 0 refills | Status: DC

## 2019-10-25 NOTE — Progress Notes (Signed)
Subjective:    Patient ID: Diane Patterson, female    DOB: 09-16-1945, 74 y.o.   MRN: [id number]  HPI Patient is being seen today as a telephone visit.  Patient consents to be seen via telephone.  Phone call began at 155.  Phone call concluded at 205.  Patient states that she received her second Covid vaccination on May 4.  Beginning on May 6 she developed fever, nausea, vomiting, and diarrhea.  That gradually improved however over the last 3 to 4 days she has now developed a cough.  She is unable to bring up any sputum however her shortness of breath has worsened.  She is wheezing more frequently.  She reports chest congestion.  She reports thick mucus in her airways in addition to the increased wheezing and she feels like she is getting bronchitis.  She denies any fevers.  She denies any hemoptysis.  She denies any chest pain.  She denies any purulent sputum. Past Medical History:  Diagnosis Date  . Asthma   . Chronic airflow obstruction (HCC)   . Hypertension   . Tobacco abuse   . Venous insufficiency    Past Surgical History:  Procedure Laterality Date  . cataract    . NECK SURGERY    . no colonoscopy     "afraid to "; [facility] reviewed  . VESICOVAGINAL FISTULA CLOSURE W/ TAH     Current Outpatient Medications on File Prior to Visit  Medication Sig Dispense Refill  . acetaminophen (TYLENOL) 325 MG tablet Take 650 mg by mouth every 6 (six) hours as needed for mild pain.     Marland Kitchen albuterol (PROVENTIL) (2.5 MG/3ML) 0.083% nebulizer solution Take 3 mLs (2.5 mg total) by nebulization every 6 (six) hours as needed for wheezing or shortness of breath. 150 mL 1  . albuterol (VENTOLIN HFA) 108 (90 Base) MCG/ACT inhaler Inhale 2 puffs into the lungs every 4 (four) hours as needed for wheezing or shortness of breath. 18 g 3  . apixaban (ELIQUIS) 5 MG TABS tablet Take 1 tablet (5 mg total) by mouth 2 (two) times daily. 180 tablet 3  . budesonide-formoterol (SYMBICORT) 160-4.5 MCG/ACT inhaler  INHALE 2 PUFFS INTO LUNGS TWICE DAILY 10.2 Inhaler 5  . diltiazem (CARTIA XT) 240 MG 24 hr capsule TAKE 1 CAPSULE(240 MG) BY MOUTH DAILY 90 capsule 1  . hydrochlorothiazide (MICROZIDE) 12.5 MG capsule TAKE 2 CAPSULES(25 MG) BY MOUTH DAILY 180 capsule 3  . ipratropium-albuterol (DUONEB) 0.5-2.5 (3) MG/3ML SOLN Take 3 mLs by nebulization 3 (three) times daily. Dx J44.9 270 mL 5  . potassium chloride SA (KLOR-CON M20) 20 MEQ tablet Take 1 tablet (20 mEq total) by mouth daily. 90 tablet 3  . predniSONE (DELTASONE) 20 MG tablet Take 1 tablet (20 mg total) by mouth daily with breakfast. 5 tablet 0  . Respiratory Therapy Supplies (FLUTTER) DEVI Use as directed. 1 each 0  . Tiotropium Bromide Monohydrate (SPIRIVA RESPIMAT) 2.5 MCG/ACT AERS Inhale 2 puffs into the lungs daily. 4 g 4   No current facility-administered medications on file prior to visit.   No Known Allergies Social History   Socioeconomic History  . Marital status: Married    Spouse name: Not on file  . Number of children: 1  . Years of education: Not on file  . Highest education level: Not on file  Occupational History  . Not on file  Tobacco Use  . Smoking status: Former Smoker    Packs/day: 0.75  Years: 15.00    Pack years: 11.25    Types: Cigarettes    Quit date: 03/20/2014    Years since quitting: 5.6  . Smokeless tobacco: Former Systems developer    Quit date: 03/11/2014  Substance and Sexual Activity  . Alcohol use: No    Alcohol/week: 0.0 standard drinks  . Drug use: No  . Sexual activity: Not on file  Other Topics Concern  . Not on file  Social History Narrative  . Not on file   Social Determinants of Health   Financial Resource Strain:   . Difficulty of Paying Living Expenses:   Food Insecurity:   . Worried About Charity fundraiser in the Last Year:   . Arboriculturist in the Last Year:   Transportation Needs:   . Film/video editor (Medical):   Marland Kitchen Lack of Transportation (Non-Medical):   Physical Activity:     . Days of Exercise per Week:   . Minutes of Exercise per Session:   Stress:   . Feeling of Stress :   Social Connections:   . Frequency of Communication with Friends and Family:   . Frequency of Social Gatherings with Friends and Family:   . Attends Religious Services:   . Active Member of Clubs or Organizations:   . Attends Archivist Meetings:   Marland Kitchen Marital Status:   Intimate Partner Violence:   . Fear of Current or Ex-Partner:   . Emotionally Abused:   Marland Kitchen Physically Abused:   . Sexually Abused:       Review of Systems  Respiratory: Positive for cough, chest tightness, shortness of breath and wheezing.        Objective:   Physical Exam        Assessment & Plan:  COPD exacerbation ([place])  Patient typically takes 20 mg of prednisone daily.  Temporarily increase this to 60 mg daily for 5 days coupled with a Z-Pak given her increased cough increased shortness of breath and increased chest congestion.  Use albuterol 2 to 3 puffs every 6 hours as needed for wheezing.  Seek medical attention immediately if worsening.  Doubt Covid given the fact she is already have 1 Covid vaccination and just received her second.

## 2019-11-05 ENCOUNTER — Ambulatory Visit (INDEPENDENT_AMBULATORY_CARE_PROVIDER_SITE_OTHER): Payer: Medicare Other | Admitting: Family Medicine

## 2019-11-05 ENCOUNTER — Other Ambulatory Visit: Payer: Self-pay

## 2019-11-05 ENCOUNTER — Emergency Department (HOSPITAL_COMMUNITY)
Admission: EM | Admit: 2019-11-05 | Discharge: 2019-11-05 | Disposition: A | Discharge status: Home / Self Care | Payer: Medicare Other | Attending: Emergency Medicine | Admitting: Emergency Medicine

## 2019-11-05 ENCOUNTER — Emergency Department (HOSPITAL_COMMUNITY): Payer: Medicare Other

## 2019-11-05 DIAGNOSIS — R112 Nausea with vomiting, unspecified: Secondary | ICD-10-CM | POA: Diagnosis not present

## 2019-11-05 DIAGNOSIS — R0600 Dyspnea, unspecified: Secondary | ICD-10-CM

## 2019-11-05 DIAGNOSIS — E119 Type 2 diabetes mellitus without complications: Secondary | ICD-10-CM | POA: Insufficient documentation

## 2019-11-05 DIAGNOSIS — E876 Hypokalemia: Secondary | ICD-10-CM

## 2019-11-05 DIAGNOSIS — J449 Chronic obstructive pulmonary disease, unspecified: Secondary | ICD-10-CM | POA: Diagnosis not present

## 2019-11-05 DIAGNOSIS — R55 Syncope and collapse: Secondary | ICD-10-CM

## 2019-11-05 DIAGNOSIS — I951 Orthostatic hypotension: Secondary | ICD-10-CM | POA: Insufficient documentation

## 2019-11-05 DIAGNOSIS — Z79899 Other long term (current) drug therapy: Secondary | ICD-10-CM | POA: Diagnosis not present

## 2019-11-05 DIAGNOSIS — I4891 Unspecified atrial fibrillation: Secondary | ICD-10-CM | POA: Insufficient documentation

## 2019-11-05 DIAGNOSIS — Z7901 Long term (current) use of anticoagulants: Secondary | ICD-10-CM | POA: Diagnosis not present

## 2019-11-05 DIAGNOSIS — R42 Dizziness and giddiness: Secondary | ICD-10-CM | POA: Diagnosis present

## 2019-11-05 DIAGNOSIS — E860 Dehydration: Secondary | ICD-10-CM | POA: Insufficient documentation

## 2019-11-05 DIAGNOSIS — R61 Generalized hyperhidrosis: Secondary | ICD-10-CM | POA: Insufficient documentation

## 2019-11-05 LAB — BASIC METABOLIC PANEL
Anion gap: 9 (ref 5–15)
BUN: 23 mg/dL (ref 8–23)
CO2: 29 mmol/L (ref 22–32)
Calcium: 8.5 mg/dL — ABNORMAL LOW (ref 8.9–10.3)
Chloride: 99 mmol/L (ref 98–111)
Creatinine, Ser: 1.08 mg/dL — ABNORMAL HIGH (ref 0.44–1.00)
GFR calc Af Amer: 59 mL/min — ABNORMAL LOW (ref >60)
GFR calc non Af Amer: 51 mL/min — ABNORMAL LOW (ref >60)
Glucose, Bld: 146 mg/dL — ABNORMAL HIGH (ref 70–99)
Potassium: 3 mmol/L — ABNORMAL LOW (ref 3.5–5.1)
Sodium: 137 mmol/L (ref 135–145)

## 2019-11-05 LAB — URINALYSIS, ROUTINE W REFLEX MICROSCOPIC
Bilirubin Urine: NEGATIVE
Glucose, UA: NEGATIVE mg/dL
Hgb urine dipstick: NEGATIVE
Ketones, ur: NEGATIVE mg/dL
Leukocytes,Ua: NEGATIVE
Nitrite: NEGATIVE
Protein, ur: NEGATIVE mg/dL
Specific Gravity, Urine: 1.011 (ref 1.005–1.030)
pH: 6 (ref 5.0–8.0)

## 2019-11-05 LAB — CBC WITH DIFFERENTIAL/PLATELET
Abs Immature Granulocytes: 0.09 10*3/uL — ABNORMAL HIGH (ref 0.00–0.07)
Basophils Absolute: 0 10*3/uL (ref 0.0–0.1)
Basophils Relative: 0 %
Eosinophils Absolute: 0.2 10*3/uL (ref 0.0–0.5)
Eosinophils Relative: 1 %
HCT: 36.6 % (ref 36.0–46.0)
Hemoglobin: 11.3 g/dL — ABNORMAL LOW (ref 12.0–15.0)
Immature Granulocytes: 1 %
Lymphocytes Relative: 11 %
Lymphs Abs: 1.8 10*3/uL (ref 0.7–4.0)
MCH: 28.8 pg (ref 26.0–34.0)
MCHC: 30.9 g/dL (ref 30.0–36.0)
MCV: 93.4 fL (ref 80.0–100.0)
Monocytes Absolute: 1.8 10*3/uL — ABNORMAL HIGH (ref 0.1–1.0)
Monocytes Relative: 11 %
Neutro Abs: 12.7 10*3/uL — ABNORMAL HIGH (ref 1.7–7.7)
Neutrophils Relative %: 76 %
Platelets: 263 10*3/uL (ref 150–400)
RBC: 3.92 MIL/uL (ref 3.87–5.11)
RDW: 14.6 % (ref 11.5–15.5)
WBC: 16.6 10*3/uL — ABNORMAL HIGH (ref 4.0–10.5)
nRBC: 0 % (ref 0.0–0.2)

## 2019-11-05 LAB — TROPONIN I (HIGH SENSITIVITY): Troponin I (High Sensitivity): 7 ng/L (ref <18)

## 2019-11-05 MED ORDER — POTASSIUM CHLORIDE CRYS ER 20 MEQ PO TBCR
20.0000 meq | EXTENDED_RELEASE_TABLET | Freq: Once | ORAL | Status: AC
  Administered 2019-11-05: 20 meq via ORAL
  Filled 2019-11-05: qty 1

## 2019-11-05 MED ORDER — SODIUM CHLORIDE 0.9 % IV BOLUS
1000.0000 mL | Freq: Once | INTRAVENOUS | Status: AC
  Administered 2019-11-05: 1000 mL via INTRAVENOUS

## 2019-11-05 NOTE — ED Triage Notes (Signed)
Pt reports this morning while fixing a meal she became [facility], dizzy and started to sweat. Pt called EMS but would not ride to hospital . Pt drove to her PCP and while she was at PCP Pt reported she vomikted x2 and the PCP called EMS to transport to Hospital. Pt is O2 dependent on 2 liters . Pt A/O on arrival to room 36.

## 2019-11-05 NOTE — Progress Notes (Signed)
Subjective:    Patient ID: Diane Patterson, female    DOB: May 16, 1946, 74 y.o.   MRN: [id number]  HPI  Patient was cooking breakfast this morning.  She suddenly became nauseated and felt like she was going to pass out.  She called 911.  New no one present in the house.  There her oxygen levels 100%.  Blood sugar was 151.  Blood pressure sitting was 112/52 blood pressure standing was 100/52.  EKG showed nonspecific ST changes in the lateral leads and ST depressions in the V1 V2.  They recommend going to the hospital.  Patient drove herself to my office.  Here she was 90% on 2 L.  She reports feel like she is smothering.  She feels like she is going to pass out.  She is extremely nauseated.  She feels short of breath.  She denies any chest pain however she has vomiting in the room report dyspnea. Past Medical History:  Diagnosis Date  . Asthma   . Chronic airflow obstruction (HCC)   . Hypertension   . Tobacco abuse   . Venous insufficiency    Past Surgical History:  Procedure Laterality Date  . cataract    . NECK SURGERY    . no colonoscopy     "afraid to "; [facility] reviewed  . VESICOVAGINAL FISTULA CLOSURE W/ TAH     Current Outpatient Medications on File Prior to Visit  Medication Sig Dispense Refill  . acetaminophen (TYLENOL) 325 MG tablet Take 650 mg by mouth every 6 (six) hours as needed for mild pain.     Marland Kitchen albuterol (PROVENTIL) (2.5 MG/3ML) 0.083% nebulizer solution Take 3 mLs (2.5 mg total) by nebulization every 6 (six) hours as needed for wheezing or shortness of breath. 150 mL 1  . albuterol (VENTOLIN HFA) 108 (90 Base) MCG/ACT inhaler Inhale 2 puffs into the lungs every 4 (four) hours as needed for wheezing or shortness of breath. 18 g 3  . apixaban (ELIQUIS) 5 MG TABS tablet Take 1 tablet (5 mg total) by mouth 2 (two) times daily. 180 tablet 3  . azithromycin (ZITHROMAX) 250 MG tablet 2 tabs poqday1, 1 tab poqday 2-5 6 tablet 0  . budesonide-formoterol (SYMBICORT) 160-4.5  MCG/ACT inhaler INHALE 2 PUFFS INTO LUNGS TWICE DAILY 10.2 Inhaler 5  . diltiazem (CARTIA XT) 240 MG 24 hr capsule TAKE 1 CAPSULE(240 MG) BY MOUTH DAILY 90 capsule 1  . hydrochlorothiazide (MICROZIDE) 12.5 MG capsule TAKE 2 CAPSULES(25 MG) BY MOUTH DAILY 180 capsule 3  . ipratropium-albuterol (DUONEB) 0.5-2.5 (3) MG/3ML SOLN Take 3 mLs by nebulization 3 (three) times daily. Dx J44.9 270 mL 5  . potassium chloride SA (KLOR-CON M20) 20 MEQ tablet Take 1 tablet (20 mEq total) by mouth daily. 90 tablet 3  . predniSONE (DELTASONE) 20 MG tablet Take 1 tablet (20 mg total) by mouth daily with breakfast. 5 tablet 0  . predniSONE (DELTASONE) 20 MG tablet Take 3 tablets (60 mg total) by mouth daily with breakfast. 15 tablet 0  . Respiratory Therapy Supplies (FLUTTER) DEVI Use as directed. 1 each 0  . Tiotropium Bromide Monohydrate (SPIRIVA RESPIMAT) 2.5 MCG/ACT AERS Inhale 2 puffs into the lungs daily. 4 g 4   No current facility-administered medications on file prior to visit.   No Known Allergies Social History   Socioeconomic History  . Marital status: Married    Spouse name: Not on file  . Number of children: 1  . Years of education: Not on file  .  Highest education level: Not on file  Occupational History  . Not on file  Tobacco Use  . Smoking status: Former Smoker    Packs/day: 0.75    Years: 15.00    Pack years: 11.25    Types: Cigarettes    Quit date: 03/20/2014    Years since quitting: 5.6  . Smokeless tobacco: Former Systems developer    Quit date: 03/11/2014  Substance and Sexual Activity  . Alcohol use: No    Alcohol/week: 0.0 standard drinks  . Drug use: No  . Sexual activity: Not on file  Other Topics Concern  . Not on file  Social History Narrative  . Not on file   Social Determinants of Health   Financial Resource Strain:   . Difficulty of Paying Living Expenses:   Food Insecurity:   . Worried About Charity fundraiser in the Last Year:   . Arboriculturist in the Last Year:     Transportation Needs:   . Film/video editor (Medical):   Marland Kitchen Lack of Transportation (Non-Medical):   Physical Activity:   . Days of Exercise per Week:   . Minutes of Exercise per Session:   Stress:   . Feeling of Stress :   Social Connections:   . Frequency of Communication with Friends and Family:   . Frequency of Social Gatherings with Friends and Family:   . Attends Religious Services:   . Active Member of Clubs or Organizations:   . Attends Archivist Meetings:   Marland Kitchen Marital Status:   Intimate Partner Violence:   . Fear of Current or Ex-Partner:   . Emotionally Abused:   Marland Kitchen Physically Abused:   . Sexually Abused:      Review of Systems  All other systems reviewed and are negative.      Objective:   Physical Exam Constitutional:      Appearance: Normal appearance.  Cardiovascular:     Rate and Rhythm: Normal rate and regular rhythm.     Heart sounds: Normal heart sounds.  Pulmonary:     Effort: Pulmonary effort is normal. No respiratory distress.     Breath sounds: Decreased air movement present. Wheezing present. No rales.  Musculoskeletal:     Right lower leg: No edema.     Left lower leg: No edema.  Neurological:     Mental Status: She is alert.           Assessment & Plan:  Near syncope  Dyspnea, unspecified type  Patient presents with shortness of breath, smothering, nausea, and subjective dyspnea.  She has ST segment depression in leads V1 and V2.  ST depression in leads V1 and V2 is new compared to her EKG from April of this year.  I am concerned about anterior septal ischemia.  Recommended she go immediately to the hospital via EMS.  Patient was given 4 aspirin prior to EMS arrival and was placed on 3 L.  She denies chest pain.

## 2019-11-05 NOTE — ED Notes (Signed)
Patient verbalizes understanding of discharge instructions . Opportunity for questions and answers were provided . Armband removed by staff ,Pt discharged from ED. W/C  offered at D/C  and Declined W/C at D/C and was escorted to lobby by RN.  

## 2019-11-05 NOTE — ED Provider Notes (Signed)
[facility] EMERGENCY DEPARTMENT Provider Note   CSN: [id number] Arrival date & time: 11/05/19  1232     History Chief Complaint  Patient presents with  . Shortness of Breath    Diane Patterson is a 74 y.o. female.  She has history of COPD, A. fib on anticoagulation.  She said she felt acutely weak and dizzy while she was making breakfast this morning.  Associated with diaphoresis.  EMS evaluated her and found her to be orthostatic but she refused treatment.  She drove to her doctor's office where she was vomiting in the car.  PCP called the ambulance to send her here.  She feels better laying down.  Gets nauseous when she stands up.  Think she is dehydrated.  Does not drink enough water she states.  She is on 2 L of oxygen at baseline.  No chest pain no abdominal pain no diarrhea.  States she is constipated and has not had a bowel movement in 3 days.  No urinary symptoms  The history is provided by the patient and the EMS personnel.  Near Syncope This is a new problem. The current episode started 3 to 5 hours ago. The problem has been gradually improving. Associated symptoms include shortness of breath (copd). Pertinent negatives include no chest pain, no abdominal pain and no headaches. The symptoms are aggravated by standing. The symptoms are relieved by rest. She has tried nothing for the symptoms. The treatment provided no relief.       Past Medical History:  Diagnosis Date  . Asthma   . Chronic airflow obstruction (HCC)   . Hypertension   . Tobacco abuse   . Venous insufficiency     Patient Active Problem List   Diagnosis Date Noted  . Diabetes mellitus type 2, uncomplicated ([place]) [id number]  . Atrial fibrillation with RVR ([place])   . AKI (acute kidney injury) ([place]) 07/11/2017  . Chronic respiratory failure with hypoxia ([place]) 11/15/2016  . Allergic rhinitis 10/14/2016  . Contusion 12/01/2015  . Abscess of umbilicus [id number]  . COPD exacerbation  ([place]) 06/23/2015  . Right carotid bruit 08/19/2014  . Hyperlipidemia 08/19/2014  . Prediabetes 08/19/2014  . Bruising 09/26/2013  . History of basal cell cancer 09/26/2013  . Oral candidiasis 01/10/2013  . Essential hypertension 12/21/2011  . COPD (chronic obstructive pulmonary disease) ([place]) 01/20/2011  . Constipation 06/24/2010  . ABDOMINAL BLOATING 06/24/2010  . ATHEROSCLEROSIS OF AORTA 02/10/2010  . FATTY LIVER DISEASE 02/10/2010  . Anxiety state 05/14/2008  . Former smoker 12/28/2006  . VENOUS INSUFFICIENCY 12/28/2006    Past Surgical History:  Procedure Laterality Date  . cataract    . NECK SURGERY    . no colonoscopy     "afraid to "; [facility] reviewed  . VESICOVAGINAL FISTULA CLOSURE W/ TAH       OB History   No obstetric history on file.     Family History  Problem Relation Age of Onset  . Diabetes Mother   . Emphysema Father   . Asthma Father   . Heart disease Father     Social History   Tobacco Use  . Smoking status: Former Smoker    Packs/day: 0.75    Years: 15.00    Pack years: 11.25    Types: Cigarettes    Quit date: 03/20/2014    Years since quitting: 5.6  . Smokeless tobacco: Former Systems developer    Quit date: 03/11/2014  Substance Use Topics  . Alcohol use: No  Alcohol/week: 0.0 standard drinks  . Drug use: No    Home Medications Prior to Admission medications   Medication Sig Start Date End Date Taking? Authorizing Provider  acetaminophen (TYLENOL) 325 MG tablet Take 650 mg by mouth every 6 (six) hours as needed for mild pain.     [provider]  albuterol (PROVENTIL) (2.5 MG/3ML) 0.083% nebulizer solution Take 3 mLs (2.5 mg total) by nebulization every 6 (six) hours as needed for wheezing or shortness of breath. 05/26/18   Delsa Grana, PA-C  albuterol (VENTOLIN HFA) 108 (90 Base) MCG/ACT inhaler Inhale 2 puffs into the lungs every 4 (four) hours as needed for wheezing or shortness of breath. 06/05/19   Susy Frizzle, MD  apixaban  (ELIQUIS) 5 MG TABS tablet Take 1 tablet (5 mg total) by mouth 2 (two) times daily. 09/19/19   Jerline Pain, MD  azithromycin (ZITHROMAX) 250 MG tablet 2 tabs poqday1, 1 tab poqday 2-5 10/25/19   Susy Frizzle, MD  budesonide-formoterol (SYMBICORT) 160-4.5 MCG/ACT inhaler INHALE 2 PUFFS INTO LUNGS TWICE DAILY 07/09/19   Susy Frizzle, MD  diltiazem (CARTIA XT) 240 MG 24 hr capsule TAKE 1 CAPSULE(240 MG) BY MOUTH DAILY 09/19/19   Jerline Pain, MD  hydrochlorothiazide (MICROZIDE) 12.5 MG capsule TAKE 2 CAPSULES(25 MG) BY MOUTH DAILY 09/19/19   Jerline Pain, MD  ipratropium-albuterol (DUONEB) 0.5-2.5 (3) MG/3ML SOLN Take 3 mLs by nebulization 3 (three) times daily. Dx J44.9 05/24/19   Parrett, Fonnie Mu, NP  potassium chloride SA (KLOR-CON M20) 20 MEQ tablet Take 1 tablet (20 mEq total) by mouth daily. 09/19/19   Jerline Pain, MD  predniSONE (DELTASONE) 20 MG tablet Take 1 tablet (20 mg total) by mouth daily with breakfast. 10/04/19   Parrett, Fonnie Mu, NP  predniSONE (DELTASONE) 20 MG tablet Take 3 tablets (60 mg total) by mouth daily with breakfast. 10/25/19   Susy Frizzle, MD  Respiratory Therapy Supplies (FLUTTER) DEVI Use as directed. 05/25/19   Parrett, Fonnie Mu, NP  Tiotropium Bromide Monohydrate (SPIRIVA RESPIMAT) 2.5 MCG/ACT AERS Inhale 2 puffs into the lungs daily. 06/05/19   Susy Frizzle, MD    Allergies    Patient has no known allergies.  Review of Systems   Review of Systems  Constitutional: Positive for diaphoresis. Negative for fever.  HENT: Negative for sore throat.   Eyes: Negative for visual disturbance.  Respiratory: Positive for shortness of breath (copd).   Cardiovascular: Positive for near-syncope. Negative for chest pain.  Gastrointestinal: Positive for nausea and vomiting. Negative for abdominal pain.  Genitourinary: Negative for dysuria.  Musculoskeletal: Negative for neck pain.  Skin: Negative for rash.  Neurological: Positive for light-headedness. Negative  for headaches.    Physical Exam Updated Vital Signs BP 129/60   Pulse 85   Temp 97.8 F (36.6 C) (Oral)   Resp 19   LMP  (LMP Unknown)   SpO2 100%   Physical Exam Vitals and nursing note reviewed.  Constitutional:      General: She is not in acute distress.    Appearance: She is well-developed.  HENT:     Head: Normocephalic and atraumatic.  Eyes:     Conjunctiva/sclera: Conjunctivae normal.  Cardiovascular:     Rate and Rhythm: Normal rate and regular rhythm.     Heart sounds: No murmur.  Pulmonary:     Effort: Pulmonary effort is normal. No respiratory distress.     Breath sounds: Normal breath sounds.  Abdominal:  Palpations: Abdomen is soft.     Tenderness: There is no abdominal tenderness.  Musculoskeletal:        General: Normal range of motion.     Cervical back: Neck supple.     Right lower leg: No tenderness.     Left lower leg: No tenderness.  Skin:    General: Skin is warm and dry.     Capillary Refill: Capillary refill takes less than 2 seconds.  Neurological:     General: No focal deficit present.     Mental Status: She is alert and oriented to person, place, and time.     ED Results / Procedures / Treatments   Labs (all labs ordered are listed, but only abnormal results are displayed) Labs Reviewed  BASIC METABOLIC PANEL - Abnormal; Notable for the following components:      Result Value   Potassium 3.0 (*)    Glucose, Bld 146 (*)    Creatinine, Ser 1.08 (*)    Calcium 8.5 (*)    GFR calc non Af Amer 51 (*)    GFR calc Af Amer 59 (*)    All other components within normal limits  CBC WITH DIFFERENTIAL/PLATELET - Abnormal; Notable for the following components:   WBC 16.6 (*)    Hemoglobin 11.3 (*)    Neutro Abs 12.7 (*)    Monocytes Absolute 1.8 (*)    Abs Immature Granulocytes 0.09 (*)    All other components within normal limits  URINALYSIS, ROUTINE W REFLEX MICROSCOPIC  TROPONIN I (HIGH SENSITIVITY)    EKG EKG  Interpretation  Date/Time:  Monday Nov 05 2019 12:42:30 EDT Ventricular Rate:  84 PR Interval:    QRS Duration: 95 QT Interval:  378 QTC Calculation: 447 R Axis:   -72 Text Interpretation: Sinus rhythm Left anterior fascicular block Borderline T wave abnormalities improved from prior 1/19 when rapid Afib Confirmed by Aletta Edouard [phone]) on 11/05/2019 1:04:51 PM   Radiology DG Chest Port 1 View  Result Date: 11/05/2019 CLINICAL DATA:  Shortness of breath EXAM: PORTABLE CHEST 1 VIEW COMPARISON:  04/04/2019 FINDINGS: Cardiac shadow is stable. Aortic calcifications are again seen. The lungs are hyperinflated but clear. No acute bony abnormality is seen. IMPRESSION: COPD without acute abnormality. Electronically Signed   By: Inez Catalina M.D.   On: 11/05/2019 13:48    Procedures Procedures (including critical care time)  Medications Ordered in ED Medications  sodium chloride 0.9 % bolus 1,000 mL (0 mLs Intravenous Stopped 11/05/19 1638)  potassium chloride SA (KLOR-CON) CR tablet 20 mEq (20 mEq Oral Given 11/05/19 1515)    ED Course  I have reviewed the triage vital signs and the nursing notes.  Pertinent labs & imaging results that were available during my care of the patient were reviewed by me and considered in my medical decision making (see chart for details).  Clinical Course as of Nov 05 2018  Mon Nov 05, 2019  1353 Chest x-ray interpreted by me as no gross infiltrates.   [MB]  1440 Labs coming back with an elevated white count of 16.6.  Potassium low at 3.0.  Creatinine slightly elevated at 1.08.  Glucose elevated at 146 nonfasting.   [MB]  1540 Patient's only gotten about 500 cc of her fluid and but she says she feels better and wants to go home.  She has been up to the commode and did not have any lightheadedness or nausea.  Will check some orthostatics.   [MB]  Clinical Course User Index [MB] Hayden Rasmussen, MD   MDM Rules/Calculators/A&P                      This patient complains of dizziness lightheadedness, nausea vomiting; this involves an extensive number of treatment Options and is a complaint that carries with it a high risk of complications and Morbidity. The differential includes COPD, CHF, ACS, anemia, metabolic derangement, dehydration  I ordered, reviewed and interpreted labs, which included CBC with elevated white count of 66, stable hemoglobin, creatinine mildly elevated, potassium mildly low, troponin normal I ordered medication IV fluids I ordered imaging studies which included chest x-ray and I independently    visualized and interpreted imaging which showed COPD, essential traits Previous records obtained and reviewed in epic  After the interventions stated above, I reevaluated the patient and found patient symptoms to be improved after IV fluids.  Orthostatics negative.  She is asking to be discharged.  Do not think she needs a delta troponin as there was never any chest pain.  Blood pressure elevated but patient with history of same.  Recommend she follow-up with her primary care doctor.  Return instructions discussed   Final Clinical Impression(s) / ED Diagnoses Final diagnoses:  Orthostatic hypotension  Dehydration  Hypokalemia    Rx / DC Orders ED Discharge Orders    None       Hayden Rasmussen, MD 11/05/19 2022

## 2019-11-05 NOTE — Discharge Instructions (Addendum)
You were seen in the emergency department for feeling lightheaded and nauseous when standing up.  Your blood pressure dropped when you were upright.  You had blood work which showed your potassium to be mildly low.  You were given some potassium here along with some IV fluids.  Your symptoms have improved.  Please continue to drink plenty of fluids at home.  Follow-up with your doctor.  Return to the emergency department if any worsening or concerning symptoms

## 2019-11-07 ENCOUNTER — Other Ambulatory Visit: Payer: Self-pay | Admitting: Cardiology

## 2019-11-07 DIAGNOSIS — I10 Essential (primary) hypertension: Secondary | ICD-10-CM

## 2019-12-20 ENCOUNTER — Ambulatory Visit (INDEPENDENT_AMBULATORY_CARE_PROVIDER_SITE_OTHER): Payer: Medicare Other | Admitting: Family Medicine

## 2019-12-20 ENCOUNTER — Other Ambulatory Visit: Payer: Self-pay

## 2019-12-20 VITALS — BP 170/70 | HR 79 | Temp 96.4°F | Ht 67.0 in | Wt 117.0 lb

## 2019-12-20 DIAGNOSIS — J9611 Chronic respiratory failure with hypoxia: Secondary | ICD-10-CM | POA: Diagnosis not present

## 2019-12-20 DIAGNOSIS — J441 Chronic obstructive pulmonary disease with (acute) exacerbation: Secondary | ICD-10-CM | POA: Diagnosis not present

## 2019-12-20 DIAGNOSIS — E876 Hypokalemia: Secondary | ICD-10-CM | POA: Diagnosis not present

## 2019-12-20 LAB — BASIC METABOLIC PANEL WITH GFR
BUN: 22 mg/dL (ref 7–25)
CO2: 30 mmol/L (ref 20–32)
Calcium: 9.5 mg/dL (ref 8.6–10.4)
Chloride: 101 mmol/L (ref 98–110)
Creat: 0.77 mg/dL (ref 0.60–0.93)
GFR, Est African American: 88 mL/min/{1.73_m2} (ref >=60)
GFR, Est Non African American: 76 mL/min/{1.73_m2} (ref >=60)
Glucose, Bld: 85 mg/dL (ref 65–99)
Potassium: 3.8 mmol/L (ref 3.5–5.3)
Sodium: 140 mmol/L (ref 135–146)

## 2019-12-20 MED ORDER — PREDNISONE 10 MG PO TABS
10.0000 mg | ORAL_TABLET | Freq: Every day | ORAL | 0 refills | Status: DC
Start: 2019-12-20 — End: 2020-01-29

## 2019-12-20 MED ORDER — BUDESONIDE-FORMOTEROL FUMARATE 160-4.5 MCG/ACT IN AERO: INHALATION_SPRAY | RESPIRATORY_TRACT | 5 refills | Status: DC

## 2019-12-20 NOTE — Progress Notes (Signed)
Subjective:    Patient ID: Diane Patterson, female    DOB: Aug 08, 1945, 74 y.o.   MRN: [id number]  HPI  I last saw the patient May 24.  At that time I referred her to the emergency room due to near syncope and potential EKG changes.  In the ER her high-sensitivity troponin was negative.  She was given IV fluids due to elevated creatinine along with potassium replacement due to hypokalemia.  It was thought that her near syncope was secondary to dehydration and orthostatic hypotension.  Patient states that she felt much better after receiving her IV fluids.  However she has not rechecked her potassium since.  She is on hydrochlorothiazide along with potassium on a daily basis.  She is requesting a refill on her Symbicort.  At the present time she is using Symbicort every day.  She uses 2 puffs twice a day as her maintenance therapy.  However she admits that she is not using the Spiriva every day.  Instead she would like to take prednisone on a daily basis because this seems to help her breathing more.  I explained to the patient that long-term steroid use causes serious complications and can actually lead to adrenal insufficiency.  I would much rather the patient take Spiriva on a daily basis along with her Symbicort to improve her breathing and therefore she could hopefully use this prednisone more sparingly.  The patient is willing to try that. Past Medical History:  Diagnosis Date  . Asthma   . Chronic airflow obstruction (HCC)   . Hypertension   . Tobacco abuse   . Venous insufficiency    Past Surgical History:  Procedure Laterality Date  . cataract    . NECK SURGERY    . no colonoscopy     "afraid to "; [facility] reviewed  . VESICOVAGINAL FISTULA CLOSURE W/ TAH     Current Outpatient Medications on File Prior to Visit  Medication Sig Dispense Refill  . acetaminophen (TYLENOL) 325 MG tablet Take 650 mg by mouth every 6 (six) hours as needed for mild pain.     Marland Kitchen albuterol (PROVENTIL) (2.5  MG/3ML) 0.083% nebulizer solution Take 3 mLs (2.5 mg total) by nebulization every 6 (six) hours as needed for wheezing or shortness of breath. 150 mL 1  . albuterol (VENTOLIN HFA) 108 (90 Base) MCG/ACT inhaler Inhale 2 puffs into the lungs every 4 (four) hours as needed for wheezing or shortness of breath. 18 g 3  . apixaban (ELIQUIS) 5 MG TABS tablet Take 1 tablet (5 mg total) by mouth 2 (two) times daily. 180 tablet 3  . diltiazem (CARDIZEM CD) 240 MG 24 hr capsule TAKE 1 CAPSULE(240 MG) BY MOUTH DAILY 90 capsule 1  . hydrochlorothiazide (MICROZIDE) 12.5 MG capsule TAKE 2 CAPSULES(25 MG) BY MOUTH DAILY 180 capsule 3  . ipratropium-albuterol (DUONEB) 0.5-2.5 (3) MG/3ML SOLN Take 3 mLs by nebulization 3 (three) times daily. Dx J44.9 270 mL 5  . potassium chloride SA (KLOR-CON M20) 20 MEQ tablet Take 1 tablet (20 mEq total) by mouth daily. 90 tablet 3  . Respiratory Therapy Supplies (FLUTTER) DEVI Use as directed. 1 each 0  . Tiotropium Bromide Monohydrate (SPIRIVA RESPIMAT) 2.5 MCG/ACT AERS Inhale 2 puffs into the lungs daily. 4 g 4   No current facility-administered medications on file prior to visit.   No Known Allergies Social History   Socioeconomic History  . Marital status: Married    Spouse name: Not on file  .  Number of children: 1  . Years of education: Not on file  . Highest education level: Not on file  Occupational History  . Not on file  Tobacco Use  . Smoking status: Former Smoker    Packs/day: 0.75    Years: 15.00    Pack years: 11.25    Types: Cigarettes    Quit date: 03/20/2014    Years since quitting: 5.7  . Smokeless tobacco: Former Systems developer    Quit date: 03/11/2014  Vaping Use  . Vaping Use: Never used  Substance and Sexual Activity  . Alcohol use: No    Alcohol/week: 0.0 standard drinks  . Drug use: No  . Sexual activity: Not on file  Other Topics Concern  . Not on file  Social History Narrative  . Not on file   Social Determinants of Health   Financial  Resource Strain:   . Difficulty of Paying Living Expenses:   Food Insecurity:   . Worried About Charity fundraiser in the Last Year:   . Arboriculturist in the Last Year:   Transportation Needs:   . Film/video editor (Medical):   Marland Kitchen Lack of Transportation (Non-Medical):   Physical Activity:   . Days of Exercise per Week:   . Minutes of Exercise per Session:   Stress:   . Feeling of Stress :   Social Connections:   . Frequency of Communication with Friends and Family:   . Frequency of Social Gatherings with Friends and Family:   . Attends Religious Services:   . Active Member of Clubs or Organizations:   . Attends Archivist Meetings:   Marland Kitchen Marital Status:   Intimate Partner Violence:   . Fear of Current or Ex-Partner:   . Emotionally Abused:   Marland Kitchen Physically Abused:   . Sexually Abused:      Review of Systems  All other systems reviewed and are negative.      Objective:   Physical Exam Constitutional:      Appearance: Normal appearance.  Cardiovascular:     Rate and Rhythm: Normal rate and regular rhythm.     Heart sounds: Normal heart sounds.  Pulmonary:     Effort: Pulmonary effort is normal. No respiratory distress.     Breath sounds: Decreased air movement present. Wheezing present. No rales.  Musculoskeletal:     Right lower leg: No edema.     Left lower leg: No edema.  Neurological:     Mental Status: She is alert.           Assessment & Plan:  Hypokalemia - Plan: BASIC METABOLIC PANEL WITH GFR  COPD exacerbation (HCC) - Plan: budesonide-formoterol (SYMBICORT) 160-4.5 MCG/ACT inhaler  Chronic respiratory failure with hypoxia (HCC)  Patient COPD is currently stable.  I explained the role of her maintenance therapy.  I would like the patient to take Symbicort twice daily every day and Spiriva once daily to help control her emphysema and hopefully prevent exacerbations of her COPD.  I do not want her over using prednisone due to potential  adrenal insufficiency and other complications.  I will give her prednisone to have on hand at home.  Patient responds quickly to prednisone when she has an attack and therefore she would like to have a supply on hand that she can use at the first sign of an exacerbation.  She particularly has a lot of trouble during the humidity in the summertime.  Therefore I will give the  patient prednisone 10 mg tablets.  She can take 1 a day when she has more trouble breathing however I would like her to use it as infrequently as possible and instead rely on using the Spiriva and the Symbicort regularly.  I will recheck her potassium and her renal function today.  If her potassium remains low I will discontinue hydrochlorothiazide and likely replace it with an alternative.

## 2019-12-24 ENCOUNTER — Other Ambulatory Visit: Payer: Self-pay | Admitting: Cardiology

## 2019-12-24 NOTE — Telephone Encounter (Signed)
°*  STAT* If patient is at the pharmacy, call can be transferred to refill team.   1. Which medications need to be refilled? (please list name of each medication and dose if known)  apixaban (ELIQUIS) 5 MG TABS tablet  2. Which pharmacy/location (including street and city if local pharmacy) is medication to be sent to? [id number] - [place], [place] - [address]  3. Do they need a 30 day or 90 day supply? 90 day supply

## 2019-12-25 MED ORDER — APIXABAN 5 MG PO TABS: 5.0000 mg | ORAL_TABLET | Freq: Two times a day (BID) | ORAL | 1 refills | Status: DC

## 2019-12-25 NOTE — Telephone Encounter (Signed)
Pt last saw Dr Marlou Porch 09/19/19, last labs 12/20/19 Creat 0.77, age 74, weight 53.1kg, based on specified criteria pt is on appropriate dosage of Eliquis 5mg  BID.  Will refill rx.

## 2019-12-31 ENCOUNTER — Ambulatory Visit: Payer: Medicare Other | Admitting: Orthopedic Surgery

## 2020-01-18 ENCOUNTER — Ambulatory Visit: Payer: Medicare Other | Admitting: Family Medicine

## 2020-01-29 ENCOUNTER — Other Ambulatory Visit: Payer: Self-pay

## 2020-01-29 ENCOUNTER — Ambulatory Visit (INDEPENDENT_AMBULATORY_CARE_PROVIDER_SITE_OTHER): Payer: Medicare Other | Admitting: Family Medicine

## 2020-01-29 VITALS — BP 140/70 | HR 80 | Temp 98.4°F | Wt 115.0 lb

## 2020-01-29 DIAGNOSIS — E119 Type 2 diabetes mellitus without complications: Secondary | ICD-10-CM

## 2020-01-29 DIAGNOSIS — R634 Abnormal weight loss: Secondary | ICD-10-CM | POA: Diagnosis not present

## 2020-01-29 NOTE — Progress Notes (Signed)
Subjective:    Patient ID: Diane Patterson, female    DOB: July 04, 1945, 74 y.o.   MRN: [id number]  HPI  Wt Readings from Last 3 Encounters:  01/29/20 115 lb (52.2 kg)  12/20/19 117 lb (53.1 kg)  09/19/19 123 lb (55.8 kg)   Patient is a very sweet 74 year old Caucasian female who presents today for weight loss. She has lost approximately 8 pounds since April. She has a history of diabetes, COPD/asthma. She has a history of frequent COPD exacerbations. She has not recently been checking her sugars. Past medical history is complicated by the fact that her husband recently passed away from COPD. She denies any depression. She states that the shock has been such that she has not had time to grieve yet. Furthermore she was losing weight prior to his passing. She denies any fevers or chills. She denies any hemoptysis. She denies any new cough or new chest pain or new shortness of breath. She denies any nausea or vomiting or abdominal pain. She denies any melena or hematochezia. She denies any diarrhea. She denies any hematuria or dysuria. She denies any bleeding or bruising. She has a history of a hysterectomy and only has a 1 ovary remaining. She denies any vaginal discharge or pelvic pain   Past Medical History:  Diagnosis Date  . Asthma   . Chronic airflow obstruction (HCC)   . Hypertension   . Tobacco abuse   . Venous insufficiency    Past Surgical History:  Procedure Laterality Date  . cataract    . NECK SURGERY    . no colonoscopy     "afraid to "; [facility] reviewed  . VESICOVAGINAL FISTULA CLOSURE W/ TAH     Current Outpatient Medications on File Prior to Visit  Medication Sig Dispense Refill  . acetaminophen (TYLENOL) 325 MG tablet Take 650 mg by mouth every 6 (six) hours as needed for mild pain.     Marland Kitchen albuterol (PROVENTIL) (2.5 MG/3ML) 0.083% nebulizer solution Take 3 mLs (2.5 mg total) by nebulization every 6 (six) hours as needed for wheezing or shortness of breath. 150 mL 1  .  albuterol (VENTOLIN HFA) 108 (90 Base) MCG/ACT inhaler Inhale 2 puffs into the lungs every 4 (four) hours as needed for wheezing or shortness of breath. 18 g 3  . apixaban (ELIQUIS) 5 MG TABS tablet Take 1 tablet (5 mg total) by mouth 2 (two) times daily. 180 tablet 1  . budesonide-formoterol (SYMBICORT) 160-4.5 MCG/ACT inhaler INHALE 2 PUFFS INTO LUNGS TWICE DAILY 10.2 Inhaler 5  . diltiazem (CARDIZEM CD) 240 MG 24 hr capsule TAKE 1 CAPSULE(240 MG) BY MOUTH DAILY 90 capsule 1  . hydrochlorothiazide (MICROZIDE) 12.5 MG capsule TAKE 2 CAPSULES(25 MG) BY MOUTH DAILY 180 capsule 3  . ipratropium-albuterol (DUONEB) 0.5-2.5 (3) MG/3ML SOLN Take 3 mLs by nebulization 3 (three) times daily. Dx J44.9 270 mL 5  . potassium chloride SA (KLOR-CON M20) 20 MEQ tablet Take 1 tablet (20 mEq total) by mouth daily. 90 tablet 3  . predniSONE (DELTASONE) 10 MG tablet Take 1 tablet (10 mg total) by mouth daily with breakfast. 30 tablet 0  . Respiratory Therapy Supplies (FLUTTER) DEVI Use as directed. 1 each 0  . Tiotropium Bromide Monohydrate (SPIRIVA RESPIMAT) 2.5 MCG/ACT AERS Inhale 2 puffs into the lungs daily. 4 g 4   No current facility-administered medications on file prior to visit.   No Known Allergies Social History   Socioeconomic History  . Marital status: Married  Spouse name: Not on file  . Number of children: 1  . Years of education: Not on file  . Highest education level: Not on file  Occupational History  . Not on file  Tobacco Use  . Smoking status: Former Smoker    Packs/day: 0.75    Years: 15.00    Pack years: 11.25    Types: Cigarettes    Quit date: 03/20/2014    Years since quitting: 5.8  . Smokeless tobacco: Former Systems developer    Quit date: 03/11/2014  Vaping Use  . Vaping Use: Never used  Substance and Sexual Activity  . Alcohol use: No    Alcohol/week: 0.0 standard drinks  . Drug use: No  . Sexual activity: Not on file  Other Topics Concern  . Not on file  Social History  Narrative  . Not on file   Social Determinants of Health   Financial Resource Strain:   . Difficulty of Paying Living Expenses:   Food Insecurity:   . Worried About Charity fundraiser in the Last Year:   . Arboriculturist in the Last Year:   Transportation Needs:   . Film/video editor (Medical):   Marland Kitchen Lack of Transportation (Non-Medical):   Physical Activity:   . Days of Exercise per Week:   . Minutes of Exercise per Session:   Stress:   . Feeling of Stress :   Social Connections:   . Frequency of Communication with Friends and Family:   . Frequency of Social Gatherings with Friends and Family:   . Attends Religious Services:   . Active Member of Clubs or Organizations:   . Attends Archivist Meetings:   Marland Kitchen Marital Status:   Intimate Partner Violence:   . Fear of Current or Ex-Partner:   . Emotionally Abused:   Marland Kitchen Physically Abused:   . Sexually Abused:      Review of Systems  All other systems reviewed and are negative.      Objective:   Physical Exam Constitutional:      Appearance: Normal appearance.  Cardiovascular:     Rate and Rhythm: Normal rate and regular rhythm.     Heart sounds: Normal heart sounds.  Pulmonary:     Effort: Pulmonary effort is normal. No respiratory distress.     Breath sounds: Decreased air movement present. Wheezing present. No rales.  Musculoskeletal:     Right lower leg: No edema.     Left lower leg: No edema.  Neurological:     Mental Status: She is alert.           Assessment & Plan:  Weight loss - Plan: Hemoglobin A1c, CBC with Differential/Platelet, COMPLETE METABOLIC PANEL WITH GFR, TSH  Type 2 diabetes mellitus without complication, without long-term current use of insulin (HCC) - Plan: Hemoglobin A1c, CBC with Differential/Platelet, COMPLETE METABOLIC PANEL WITH GFR, TSH  Given her history of diabetes, I recommended checking an A1c to ensure that her sugars are not out of control. I will also check a  CMP to evaluate for any evidence of kidney failure or liver inflammation. Check a TSH to evaluate for hypothyroidism. Check a chest x-ray given her history of smoking and emphysema to rule out lung cancer. If lab work is normal, the next step would be to check stool for blood and proceed with a CT scan of the abdomen and pelvis. Also recommended the patient begin drinking Ensure 1 to 2 cans a day to increase her  protein/calorie intake

## 2020-01-30 LAB — CBC WITH DIFFERENTIAL/PLATELET
Absolute Monocytes: 863 cells/uL (ref 200–950)
Basophils Absolute: 58 cells/uL (ref 0–200)
Basophils Relative: 0.6 %
Eosinophils Absolute: 223 cells/uL (ref 15–500)
Eosinophils Relative: 2.3 %
HCT: 37.5 % (ref 35.0–45.0)
Hemoglobin: 12.2 g/dL (ref 11.7–15.5)
Lymphs Abs: 2047 cells/uL (ref 850–3900)
MCH: 29 pg (ref 27.0–33.0)
MCHC: 32.5 g/dL (ref 32.0–36.0)
MCV: 89.3 fL (ref 80.0–100.0)
MPV: 10.7 fL (ref 7.5–12.5)
Monocytes Relative: 8.9 %
Neutro Abs: 6509 cells/uL (ref 1500–7800)
Neutrophils Relative %: 67.1 %
Platelets: 376 10*3/uL (ref 140–400)
RBC: 4.2 10*6/uL (ref 3.80–5.10)
RDW: 13.2 % (ref 11.0–15.0)
Total Lymphocyte: 21.1 %
WBC: 9.7 10*3/uL (ref 3.8–10.8)

## 2020-01-30 LAB — COMPLETE METABOLIC PANEL WITH GFR
AG Ratio: 1.8 (calc) (ref 1.0–2.5)
ALT: 9 U/L (ref 6–29)
AST: 13 U/L (ref 10–35)
Albumin: 4.2 g/dL (ref 3.6–5.1)
Alkaline phosphatase (APISO): 72 U/L (ref 37–153)
BUN/Creatinine Ratio: 27 (calc) — ABNORMAL HIGH (ref 6–22)
BUN: 29 mg/dL — ABNORMAL HIGH (ref 7–25)
CO2: 29 mmol/L (ref 20–32)
Calcium: 9.8 mg/dL (ref 8.6–10.4)
Chloride: 98 mmol/L (ref 98–110)
Creat: 1.08 mg/dL — ABNORMAL HIGH (ref 0.60–0.93)
GFR, Est African American: 59 mL/min/{1.73_m2} — ABNORMAL LOW (ref >=60)
GFR, Est Non African American: 51 mL/min/{1.73_m2} — ABNORMAL LOW (ref >=60)
Globulin: 2.4 g/dL (calc) (ref 1.9–3.7)
Glucose, Bld: 100 mg/dL — ABNORMAL HIGH (ref 65–99)
Potassium: 3.8 mmol/L (ref 3.5–5.3)
Sodium: 138 mmol/L (ref 135–146)
Total Bilirubin: 0.5 mg/dL (ref 0.2–1.2)
Total Protein: 6.6 g/dL (ref 6.1–8.1)

## 2020-01-30 LAB — TSH: TSH: 2.69 mIU/L (ref 0.40–4.50)

## 2020-01-30 LAB — HEMOGLOBIN A1C
Hgb A1c MFr Bld: 6.1 % of total Hgb — ABNORMAL HIGH (ref <5.7)
Mean Plasma Glucose: 128 (calc)
eAG (mmol/L): 7.1 (calc)

## 2020-02-06 ENCOUNTER — Ambulatory Visit: Payer: Medicare Other | Admitting: Emergency Medicine

## 2020-03-03 ENCOUNTER — Other Ambulatory Visit: Payer: Self-pay

## 2020-03-03 ENCOUNTER — Encounter (HOSPITAL_COMMUNITY): Payer: Self-pay | Admitting: *Deleted

## 2020-03-03 ENCOUNTER — Emergency Department (HOSPITAL_COMMUNITY): Payer: Medicare Other

## 2020-03-03 ENCOUNTER — Observation Stay (HOSPITAL_COMMUNITY)
Admission: EM | Admit: 2020-03-03 | Discharge: 2020-03-04 | Disposition: A | Discharge status: Home / Self Care | Payer: Medicare Other | Attending: Internal Medicine | Admitting: Internal Medicine

## 2020-03-03 DIAGNOSIS — Z20822 Contact with and (suspected) exposure to covid-19: Secondary | ICD-10-CM | POA: Diagnosis not present

## 2020-03-03 DIAGNOSIS — R778 Other specified abnormalities of plasma proteins: Secondary | ICD-10-CM

## 2020-03-03 DIAGNOSIS — I959 Hypotension, unspecified: Secondary | ICD-10-CM

## 2020-03-03 DIAGNOSIS — R079 Chest pain, unspecified: Secondary | ICD-10-CM | POA: Diagnosis present

## 2020-03-03 DIAGNOSIS — J9611 Chronic respiratory failure with hypoxia: Secondary | ICD-10-CM | POA: Insufficient documentation

## 2020-03-03 DIAGNOSIS — I480 Paroxysmal atrial fibrillation: Secondary | ICD-10-CM | POA: Diagnosis not present

## 2020-03-03 DIAGNOSIS — Z87891 Personal history of nicotine dependence: Secondary | ICD-10-CM | POA: Insufficient documentation

## 2020-03-03 DIAGNOSIS — J449 Chronic obstructive pulmonary disease, unspecified: Secondary | ICD-10-CM | POA: Insufficient documentation

## 2020-03-03 DIAGNOSIS — I10 Essential (primary) hypertension: Secondary | ICD-10-CM | POA: Insufficient documentation

## 2020-03-03 DIAGNOSIS — Z7901 Long term (current) use of anticoagulants: Secondary | ICD-10-CM | POA: Diagnosis not present

## 2020-03-03 DIAGNOSIS — I21A1 Myocardial infarction type 2: Secondary | ICD-10-CM | POA: Insufficient documentation

## 2020-03-03 DIAGNOSIS — Z79899 Other long term (current) drug therapy: Secondary | ICD-10-CM | POA: Insufficient documentation

## 2020-03-03 DIAGNOSIS — I214 Non-ST elevation (NSTEMI) myocardial infarction: Secondary | ICD-10-CM

## 2020-03-03 DIAGNOSIS — I4891 Unspecified atrial fibrillation: Secondary | ICD-10-CM

## 2020-03-03 DIAGNOSIS — R072 Precordial pain: Secondary | ICD-10-CM

## 2020-03-03 LAB — BRAIN NATRIURETIC PEPTIDE: B Natriuretic Peptide: 74.3 pg/mL (ref 0.0–100.0)

## 2020-03-03 LAB — HEPATIC FUNCTION PANEL
ALT: 14 U/L (ref 0–44)
AST: 18 U/L (ref 15–41)
Albumin: 3.3 g/dL — ABNORMAL LOW (ref 3.5–5.0)
Alkaline Phosphatase: 66 U/L (ref 38–126)
Bilirubin, Direct: <0.1 mg/dL (ref 0.0–0.2)
Total Bilirubin: 0.7 mg/dL (ref 0.3–1.2)
Total Protein: 6.2 g/dL — ABNORMAL LOW (ref 6.5–8.1)

## 2020-03-03 LAB — BASIC METABOLIC PANEL
Anion gap: 11 (ref 5–15)
BUN: 15 mg/dL (ref 8–23)
CO2: 27 mmol/L (ref 22–32)
Calcium: 9.1 mg/dL (ref 8.9–10.3)
Chloride: 99 mmol/L (ref 98–111)
Creatinine, Ser: 0.93 mg/dL (ref 0.44–1.00)
GFR calc Af Amer: >60 mL/min (ref >60)
GFR calc non Af Amer: >60 mL/min (ref >60)
Glucose, Bld: 100 mg/dL — ABNORMAL HIGH (ref 70–99)
Potassium: 3.8 mmol/L (ref 3.5–5.1)
Sodium: 137 mmol/L (ref 135–145)

## 2020-03-03 LAB — CBC
HCT: 38.9 % (ref 36.0–46.0)
Hemoglobin: 12 g/dL (ref 12.0–15.0)
MCH: 28.8 pg (ref 26.0–34.0)
MCHC: 30.8 g/dL (ref 30.0–36.0)
MCV: 93.5 fL (ref 80.0–100.0)
Platelets: 327 10*3/uL (ref 150–400)
RBC: 4.16 MIL/uL (ref 3.87–5.11)
RDW: 13.2 % (ref 11.5–15.5)
WBC: 10.3 10*3/uL (ref 4.0–10.5)
nRBC: 0 % (ref 0.0–0.2)

## 2020-03-03 LAB — URINALYSIS, ROUTINE W REFLEX MICROSCOPIC
Bilirubin Urine: NEGATIVE
Glucose, UA: NEGATIVE mg/dL
Hgb urine dipstick: NEGATIVE
Ketones, ur: 5 mg/dL — AB
Leukocytes,Ua: NEGATIVE
Nitrite: NEGATIVE
Protein, ur: NEGATIVE mg/dL
Specific Gravity, Urine: 1.009 (ref 1.005–1.030)
pH: 6 (ref 5.0–8.0)

## 2020-03-03 LAB — TSH: TSH: 1.705 u[IU]/mL (ref 0.350–4.500)

## 2020-03-03 LAB — LIPASE, BLOOD: Lipase: 36 U/L (ref 11–51)

## 2020-03-03 LAB — PROTIME-INR
INR: 1.4 — ABNORMAL HIGH (ref 0.8–1.2)
Prothrombin Time: 16.3 seconds — ABNORMAL HIGH (ref 11.4–15.2)

## 2020-03-03 LAB — TROPONIN I (HIGH SENSITIVITY)
Troponin I (High Sensitivity): 102 ng/L (ref <18)
Troponin I (High Sensitivity): 141 ng/L (ref <18)
Troponin I (High Sensitivity): 101 ng/L (ref <18)

## 2020-03-03 MED ORDER — ACETAMINOPHEN 325 MG PO TABS: 650.0000 mg | ORAL_TABLET | ORAL | Status: DC | PRN

## 2020-03-03 MED ORDER — ONDANSETRON HCL 4 MG/2ML IJ SOLN: 4.0000 mg | Freq: Four times a day (QID) | INTRAMUSCULAR | Status: DC | PRN

## 2020-03-03 MED ORDER — APIXABAN 5 MG PO TABS
5.0000 mg | ORAL_TABLET | Freq: Two times a day (BID) | ORAL | Status: DC
  Administered 2020-03-04 (×2): 5 mg via ORAL
  Filled 2020-03-03 (×2): qty 1

## 2020-03-03 MED ORDER — DILTIAZEM HCL ER COATED BEADS 240 MG PO CP24
240.0000 mg | ORAL_CAPSULE | Freq: Every day | ORAL | Status: DC
  Administered 2020-03-04: 240 mg via ORAL
  Filled 2020-03-03: qty 1
  Filled 2020-03-03: qty 2

## 2020-03-03 MED ORDER — MOMETASONE FURO-FORMOTEROL FUM 200-5 MCG/ACT IN AERO
2.0000 | INHALATION_SPRAY | Freq: Two times a day (BID) | RESPIRATORY_TRACT | Status: DC
  Administered 2020-03-04 (×2): 2 via RESPIRATORY_TRACT
  Filled 2020-03-03: qty 8.8

## 2020-03-03 MED ORDER — HYDROCHLOROTHIAZIDE 12.5 MG PO CAPS: 25.0000 mg | ORAL_CAPSULE | Freq: Every day | ORAL | Status: DC

## 2020-03-03 MED ORDER — SODIUM CHLORIDE 0.9 % IV SOLN: Freq: Once | INTRAVENOUS | Status: AC

## 2020-03-03 MED ORDER — ALBUTEROL SULFATE HFA 108 (90 BASE) MCG/ACT IN AERS
2.0000 | INHALATION_SPRAY | RESPIRATORY_TRACT | Status: DC | PRN
  Filled 2020-03-03: qty 6.7

## 2020-03-03 NOTE — ED Provider Notes (Signed)
Signout from Dr. Vallery Ridge.  74 year old female paroxysmal A. fib on anticoagulation felt nausea and dizzy lightheaded yesterday.  Today symptoms recurred and she called EMS.  Associate with chest burning.  EMS found her in A. fib with RVR which converted with fluid bolus.  Still feeling slightly dizzy and mild chest discomfort.  Getting fluid bolus and labs.  Plan is to follow-up on work-up and consideration for cardiology consult versus observation admission. Physical Exam  BP 124/66 (BP Location: Left Arm)   Pulse 90   Temp 97.6 F (36.4 C) (Oral)   Resp 14   LMP  (LMP Unknown)   SpO2 98%   Physical Exam  ED Course/Procedures     Procedures  MDM  Patient's troponin elevated and rising on the delta troponin.  She denies currently having any chest pain but states she still feels kind of weak.  Has had no recurrence of her A. fib since she has been here.  Consulted cardiology at 630 and again at 730 without any response.  New cardiologist is on at 70 I discussed with her Dr. Marletta Lor and she will consult on the patient.  Discussed with Dr. Marletta Lor cardiology.  She saw and evaluated the patient.  She recommends admitting the patient to a medical service and will order her an echo for the morning.  She thinks this is likely related to demand from her A. fib but thinks there may be an element of some failure to thrive and generalized poor p.o. intake.  [facility] hospitalist paged       Hayden Rasmussen, MD 03/04/20 [phone]

## 2020-03-03 NOTE — Consult Note (Signed)
Cardiology Admission History and Physical:   Patient ID: Diane Patterson MRN: [id number]; DOB: 02/05/1946   Admission date: 03/03/2020  Primary Care Provider: Susy Frizzle, MD [facility] Cardiologist: Candee Furbish, MD  [place] Electrophysiologist:  None   Chief Complaint:    Patient Profile:   Diane Patterson is a 74 y.o. female with atrial fibrilation on Eliquis, COPD on 2L home O2, and hypertension who presented with hypotension and AF w/ RVR.   History of Present Illness:   Diane Patterson has been living alone since her husband recently passed away. She has generally been in a good state of health despite her COPD, but does admit that recently she has been eating less and not drinking as much as usual. She recalled feeling "weak" yesterday but denied worsening SOB, fever, cough, or dysuria. The AM of presentation, she noted feeling lightheaded and developed a substernal burning sensation with dizziness. Worried she would pass out she unlocked the door and called 911. When they arrived she was reportedly hypotensive with SBPs in the 80s and HR 160s in AF w/ RVR. She was bolused with IV fluid en route and spontaneous converted to SB in the  60s.   EKG on arrival was without ischemic changes. Labs notable for hsTn 102 -> 141 -> 101. CXR without edema - consistent with known COPD.   At the time of my evaluation she was chest pain free though tells me she continues to feel "weak". She also noted some ongoing SOB which she attributed to needing her Symbicort.    Past Medical History:  Diagnosis Date  . Asthma   . Chronic airflow obstruction (HCC)   . Dysrhythmia   . Hypertension   . Tobacco abuse   . Venous insufficiency     Past Surgical History:  Procedure Laterality Date  . cataract    . NECK SURGERY    . no colonoscopy     "afraid to "; [facility] reviewed  . VESICOVAGINAL FISTULA CLOSURE W/ TAH       Medications Prior to Admission: Prior to Admission  medications   Medication Sig Start Date End Date Taking? Authorizing Provider  acetaminophen (TYLENOL) 325 MG tablet Take 650 mg by mouth every 6 (six) hours as needed for mild pain.    Yes [provider]  albuterol (VENTOLIN HFA) 108 (90 Base) MCG/ACT inhaler Inhale 2 puffs into the lungs every 4 (four) hours as needed for wheezing or shortness of breath. 06/05/19  Yes Susy Frizzle, MD  apixaban (ELIQUIS) 5 MG TABS tablet Take 1 tablet (5 mg total) by mouth 2 (two) times daily. 12/25/19  Yes Jerline Pain, MD  budesonide-formoterol (SYMBICORT) 160-4.5 MCG/ACT inhaler INHALE 2 PUFFS INTO LUNGS TWICE DAILY Patient taking differently: Inhale 2 puffs into the lungs in the morning and at bedtime.  12/20/19  Yes Susy Frizzle, MD  diltiazem (CARDIZEM CD) 240 MG 24 hr capsule TAKE 1 CAPSULE(240 MG) BY MOUTH DAILY Patient taking differently: Take 240 mg by mouth daily.  11/07/19  Yes Jerline Pain, MD  hydrochlorothiazide (MICROZIDE) 12.5 MG capsule TAKE 2 CAPSULES(25 MG) BY MOUTH DAILY Patient taking differently: Take 25 mg by mouth daily.  11/07/19  Yes Jerline Pain, MD  potassium chloride SA (KLOR-CON M20) 20 MEQ tablet Take 1 tablet (20 mEq total) by mouth daily. 09/19/19  Yes Jerline Pain, MD  albuterol (PROVENTIL) (2.5 MG/3ML) 0.083% nebulizer solution Take 3 mLs (2.5 mg total) by nebulization every  6 (six) hours as needed for wheezing or shortness of breath. Patient not taking: Reported on 03/03/2020 05/26/18   Delsa Grana, PA-C  ipratropium-albuterol (DUONEB) 0.5-2.5 (3) MG/3ML SOLN Take 3 mLs by nebulization 3 (three) times daily. Dx J44.9 Patient not taking: Reported on 03/03/2020 05/24/19   Parrett, Fonnie Mu, NP  Respiratory Therapy Supplies (FLUTTER) DEVI Use as directed. 05/25/19   Parrett, Fonnie Mu, NP  Tiotropium Bromide Monohydrate (SPIRIVA RESPIMAT) 2.5 MCG/ACT AERS Inhale 2 puffs into the lungs daily. Patient not taking: Reported on 03/03/2020 06/05/19   Susy Frizzle,  MD     Allergies:   No Known Allergies  Social History:   Social History   Socioeconomic History  . Marital status: Married    Spouse name: Not on file  . Number of children: 1  . Years of education: Not on file  . Highest education level: Not on file  Occupational History  . Not on file  Tobacco Use  . Smoking status: Former Smoker    Packs/day: 0.75    Years: 15.00    Pack years: 11.25    Types: Cigarettes    Quit date: 03/20/2014    Years since quitting: 5.9  . Smokeless tobacco: Former Systems developer    Quit date: 03/11/2014  Vaping Use  . Vaping Use: Never used  Substance and Sexual Activity  . Alcohol use: No    Alcohol/week: 0.0 standard drinks  . Drug use: No  . Sexual activity: Not on file  Other Topics Concern  . Not on file  Social History Narrative  . Not on file   Social Determinants of Health   Financial Resource Strain:   . Difficulty of Paying Living Expenses: Not on file  Food Insecurity:   . Worried About Charity fundraiser in the Last Year: Not on file  . Ran Out of Food in the Last Year: Not on file  Transportation Needs:   . Lack of Transportation (Medical): Not on file  . Lack of Transportation (Non-Medical): Not on file  Physical Activity:   . Days of Exercise per Week: Not on file  . Minutes of Exercise per Session: Not on file  Stress:   . Feeling of Stress : Not on file  Social Connections:   . Frequency of Communication with Friends and Family: Not on file  . Frequency of Social Gatherings with Friends and Family: Not on file  . Attends Religious Services: Not on file  . Active Member of Clubs or Organizations: Not on file  . Attends Archivist Meetings: Not on file  . Marital Status: Not on file  Intimate Partner Violence:   . Fear of Current or Ex-Partner: Not on file  . Emotionally Abused: Not on file  . Physically Abused: Not on file  . Sexually Abused: Not on file    Family History:   The patient's family history  includes Asthma in her father; Diabetes in her mother; Emphysema in her father; Heart disease in her father.    ROS:  Please see the history of present illness.  All other ROS reviewed and negative.     Physical Exam/Data:   Vitals:   03/03/20 1900 03/03/20 1930 03/03/20 1936 03/03/20 2000  BP: (!) 172/67 (!) 144/59 140/65 (!) 169/71  Pulse: 70 61 76 71  Resp: (!) 21 (!) 24 (!) 26 (!) 22  Temp:   98.2 F (36.8 C)   TempSrc:   Oral   SpO2: 100% Marland Kitchen)  89% 100% 100%   No intake or output data in the 24 hours ending 03/03/20 2342 Last 3 Weights 01/29/2020 12/20/2019 09/19/2019  Weight (lbs) 115 lb 117 lb 123 lb  Weight (kg) 52.164 kg 53.071 kg 55.792 kg     There is no height or weight on file to calculate BMI.  General:  Thin, elderly woman. Pleasant, in no distress. 2L [place] in situ.  HEENT: normal Neck: no JVD Vascular: 2+ radial pulses bilaterally.   Cardiac:  normal S1, S2; RRR; no murmur. Distant heart sounds.  Lungs:  Extremely poor air movement.  Abd: soft, nontender, no hepatomegaly  Ext: no edema. Warm.  Musculoskeletal:  No deformities, BUE and BLE strength normal and equal Skin: warm and dry  Psych:  Normal affect   EKG:  The ECG that was done demonstrated: -- SR, left axis deviation, low voltage precordial leads, ?inferior Q waves, electrical alternans.   Relevant CV Studies: TTE 2019: Study Conclusions   - Procedure narrative: Transthoracic echocardiography. Technically  difficult study with suboptimal image quality.  - Left ventricle: The cavity size was normal. Wall thickness was  increased in a pattern of mild LVH. Systolic function was  vigorous. The estimated ejection fraction was in the range of 65%  to 70%.   Impressions:   - Technically difficult study. Hyperdynamic LV systolic function  with LVEF of 65-70%, mild LVH, normal LA size.   Lexiscan 2017: Notes Recorded by Jerline Pain, MD on 08/13/2015 at 6:34 PM 1. This study was count-poor at  rest and with Lexiscan stress. There is a fixed, small, mild apical perfusion defect with normal wall motion that is likely artifact. No definite ischemia or infarction.  2. Normal EF and wall motion.  3. Low risk study.   Laboratory Data:  High Sensitivity Troponin:   Recent Labs  Lab 03/03/20 1511 03/03/20 1730 03/03/20 2037  TROPONINIHS 102* 141* 101*      Chemistry Recent Labs  Lab 03/03/20 1511  NA 137  K 3.8  CL 99  CO2 27  GLUCOSE 100*  BUN 15  CREATININE 0.93  CALCIUM 9.1  GFRNONAA >60  GFRAA >60  ANIONGAP 11    Recent Labs  Lab 03/03/20 1511  PROT 6.2*  ALBUMIN 3.3*  AST 18  ALT 14  ALKPHOS 66  BILITOT 0.7   Hematology Recent Labs  Lab 03/03/20 1511  WBC 10.3  RBC 4.16  HGB 12.0  HCT 38.9  MCV 93.5  MCH 28.8  MCHC 30.8  RDW 13.2  PLT 327   BNP Recent Labs  Lab 03/03/20 1511  BNP 74.3    DDimer No results for input(s): DDIMER in the last 168 hours.   Radiology/Studies:  DG Chest 2 View  Result Date: 03/03/2020 CLINICAL DATA:  Chest pain. EXAM: CHEST - 2 VIEW COMPARISON:  Chest x-ray dated Nov 05, 2019. FINDINGS: The heart size and mediastinal contours are within normal limits. Atherosclerotic calcification of the aortic arch. Normal pulmonary vascularity. The lungs remain hyperinflated with emphysematous changes. No focal consolidation, pleural effusion, or pneumothorax. No acute osseous abnormality. Chronic T12 compression deformity. IMPRESSION: 1. No acute cardiopulmonary disease. 2. COPD. Electronically Signed   By: Titus Dubin M.D.   On: 03/03/2020 12:28       TIMI Risk Score for Unstable Angina or Non-ST Elevation MI:   The patient's TIMI risk score is 2, which indicates a 8% risk of all cause mortality, new or recurrent myocardial infarction or need for urgent  revascularization in the next 14 days.   Assessment and Plan:   1. Myocardial Injury; Suspect Type II NSTEMI ([place]) -- Suspect elevated troponin in the setting  of demand from AF with RVR as no antecedent anginal symptoms.  -- Troponins down trending; chest pain free.  -- Ok to continue home Eliquis for now -- TTE in the AM to ensure no change in function or WMA. Can determine if ischemic eval is warranted thereafter.   2. Atrial Fibrillation with RVR -- Would continue anticoagulation with Eliquis -- Continue home dilitazem.   3. Hypotension -- Query whether hypotension precipitated AF with RVR as it responded so easily to IVF bolus. Patient report poor PO intake and weight loss since her husbands passing. No clear infectious signs or symptoms.  -- Would hold HCTZ for now. May favor non-diuretic antihypertensive and be mindful of orthostasis which she has presented with before.   4. COPD -- Per medicine  Will continue to follow.    Signed, Milus Banister, MD  03/03/2020 11:42 PM

## 2020-03-03 NOTE — ED Notes (Signed)
Pt given sandwich bag and coke.

## 2020-03-03 NOTE — ED Provider Notes (Signed)
[facility] EMERGENCY DEPARTMENT Provider Note   CSN: [id number] Arrival date & time: 03/03/20  1153     History Chief Complaint  Patient presents with  . Chest Pain  . Shortness of Breath    Diane Patterson is a 74 y.o. female.  HPI Patient reports she was not feeling well yesterday.  She felt nauseated and somewhat dizzy and lightheaded.  She does not describe spinning dizziness.  Patient reports that she did not develop any vomiting or diarrhea.  No abdominal pain.  She reports today she again felt somewhat lightheaded.  She then became very lightheaded and a lot of chest pressure and burning quality.  She did call EMS.  Patient was found to be in rapid atrial fibrillation with systolic blood pressure in the 80s.  She was given a 500 cc bolus of fluids and spontaneously converted.  Patient reports that she still feels slightly dizzy and has a mild chest discomfort at this time.  He does not feel short of breath.  She has known history of atrial fibrillation and takes Cardizem and Eliquis.  Patient did not have any headache in association with the symptoms.  No confusion.  No fevers no chills no cough.  Patient has been immunized for Covid.    Past Medical History:  Diagnosis Date  . Asthma   . Chronic airflow obstruction (HCC)   . Hypertension   . Tobacco abuse   . Venous insufficiency     Patient Active Problem List   Diagnosis Date Noted  . Diabetes mellitus type 2, uncomplicated ([place]) 40/98/1191  . Atrial fibrillation with RVR ([place])   . AKI (acute kidney injury) ([place]) 07/11/2017  . Chronic respiratory failure with hypoxia ([place]) 11/15/2016  . Allergic rhinitis 10/14/2016  . Contusion 12/01/2015  . Abscess of umbilicus 47/82/9562  . COPD exacerbation ([place]) 06/23/2015  . Right carotid bruit 08/19/2014  . Hyperlipidemia 08/19/2014  . Prediabetes 08/19/2014  . Bruising 09/26/2013  . History of basal cell cancer 09/26/2013  . Oral candidiasis  01/10/2013  . Essential hypertension 12/21/2011  . COPD (chronic obstructive pulmonary disease) ([place]) 01/20/2011  . Constipation 06/24/2010  . ABDOMINAL BLOATING 06/24/2010  . ATHEROSCLEROSIS OF AORTA 02/10/2010  . FATTY LIVER DISEASE 02/10/2010  . Anxiety state 05/14/2008  . Former smoker 12/28/2006  . VENOUS INSUFFICIENCY 12/28/2006    Past Surgical History:  Procedure Laterality Date  . cataract    . NECK SURGERY    . no colonoscopy     "afraid to "; [facility] reviewed  . VESICOVAGINAL FISTULA CLOSURE W/ TAH       OB History   No obstetric history on file.     Family History  Problem Relation Age of Onset  . Diabetes Mother   . Emphysema Father   . Asthma Father   . Heart disease Father     Social History   Tobacco Use  . Smoking status: Former Smoker    Packs/day: 0.75    Years: 15.00    Pack years: 11.25    Types: Cigarettes    Quit date: 03/20/2014    Years since quitting: 5.9  . Smokeless tobacco: Former Systems developer    Quit date: 03/11/2014  Vaping Use  . Vaping Use: Never used  Substance Use Topics  . Alcohol use: No    Alcohol/week: 0.0 standard drinks  . Drug use: No    Home Medications Prior to Admission medications   Medication Sig Start Date End Date Taking?  Authorizing Provider  acetaminophen (TYLENOL) 325 MG tablet Take 650 mg by mouth every 6 (six) hours as needed for mild pain.     [provider]  albuterol (PROVENTIL) (2.5 MG/3ML) 0.083% nebulizer solution Take 3 mLs (2.5 mg total) by nebulization every 6 (six) hours as needed for wheezing or shortness of breath. 05/26/18   Delsa Grana, PA-C  albuterol (VENTOLIN HFA) 108 (90 Base) MCG/ACT inhaler Inhale 2 puffs into the lungs every 4 (four) hours as needed for wheezing or shortness of breath. 06/05/19   Susy Frizzle, MD  apixaban (ELIQUIS) 5 MG TABS tablet Take 1 tablet (5 mg total) by mouth 2 (two) times daily. 12/25/19   Jerline Pain, MD  budesonide-formoterol (SYMBICORT) 160-4.5  MCG/ACT inhaler INHALE 2 PUFFS INTO LUNGS TWICE DAILY 12/20/19   Susy Frizzle, MD  diltiazem (CARDIZEM CD) 240 MG 24 hr capsule TAKE 1 CAPSULE(240 MG) BY MOUTH DAILY 11/07/19   Jerline Pain, MD  hydrochlorothiazide (MICROZIDE) 12.5 MG capsule TAKE 2 CAPSULES(25 MG) BY MOUTH DAILY 11/07/19   Jerline Pain, MD  ipratropium-albuterol (DUONEB) 0.5-2.5 (3) MG/3ML SOLN Take 3 mLs by nebulization 3 (three) times daily. Dx J44.9 05/24/19   Parrett, Fonnie Mu, NP  potassium chloride SA (KLOR-CON M20) 20 MEQ tablet Take 1 tablet (20 mEq total) by mouth daily. 09/19/19   Jerline Pain, MD  Respiratory Therapy Supplies (FLUTTER) DEVI Use as directed. 05/25/19   Parrett, Fonnie Mu, NP  Tiotropium Bromide Monohydrate (SPIRIVA RESPIMAT) 2.5 MCG/ACT AERS Inhale 2 puffs into the lungs daily. 06/05/19   Susy Frizzle, MD    Allergies    Patient has no known allergies.  Review of Systems   Review of Systems 10 systems reviewed and negative except as per HPI Physical Exam Updated Vital Signs BP 124/66 (BP Location: Left Arm)   Pulse 90   Temp 97.6 F (36.4 C) (Oral)   Resp 14   LMP  (LMP Unknown)   SpO2 98%   Physical Exam Constitutional:      Comments: Alert and nontoxic.  Mental status clear.  No respiratory stress at rest.  HENT:     Mouth/Throat:     Pharynx: Oropharynx is clear.  Eyes:     Extraocular Movements: Extraocular movements intact.  Cardiovascular:     Rate and Rhythm: Normal rate and regular rhythm.  Pulmonary:     Effort: Pulmonary effort is normal.     Breath sounds: Normal breath sounds.  Abdominal:     General: There is no distension.     Palpations: Abdomen is soft.     Tenderness: There is no abdominal tenderness.  Musculoskeletal:        General: No swelling or tenderness. Normal range of motion.     Right lower leg: No edema.     Left lower leg: No edema.  Skin:    General: Skin is warm and dry.  Neurological:     General: No focal deficit present.     Mental  Status: She is oriented to person, place, and time.     Cranial Nerves: No cranial nerve deficit.     Motor: No weakness.     Coordination: Coordination normal.  Psychiatric:        Mood and Affect: Mood normal.     ED Results / Procedures / Treatments   Labs (all labs ordered are listed, but only abnormal results are displayed) Labs Reviewed  BASIC METABOLIC PANEL  CBC  HEPATIC FUNCTION PANEL  LIPASE, BLOOD  BRAIN NATRIURETIC PEPTIDE  PROTIME-INR  URINALYSIS, ROUTINE W REFLEX MICROSCOPIC  TSH  TROPONIN I (HIGH SENSITIVITY)  TROPONIN I (HIGH SENSITIVITY)    EKG EKG Interpretation  Date/Time:  Monday March 03 2020 12:02:31 EDT Ventricular Rate:  91 PR Interval:  150 QRS Duration: 80 QT Interval:  372 QTC Calculation: 457 R Axis:   -75 Text Interpretation: Normal sinus rhythm no sig change from previous Confirmed by [name] [phone]) on 03/03/2020 3:04:58 PM   Radiology DG Chest 2 View  Result Date: 03/03/2020 CLINICAL DATA:  Chest pain. EXAM: CHEST - 2 VIEW COMPARISON:  Chest x-ray dated Nov 05, 2019. FINDINGS: The heart size and mediastinal contours are within normal limits. Atherosclerotic calcification of the aortic arch. Normal pulmonary vascularity. The lungs remain hyperinflated with emphysematous changes. No focal consolidation, pleural effusion, or pneumothorax. No acute osseous abnormality. Chronic T12 compression deformity. IMPRESSION: 1. No acute cardiopulmonary disease. 2. COPD. Electronically Signed   By: Titus Dubin M.D.   On: 03/03/2020 12:28    Procedures Procedures (including critical care time)  Medications Ordered in ED Medications  0.9 %  sodium chloride infusion (has no administration in time range)    ED Course  I have reviewed the triage vital signs and the nursing notes.  Pertinent labs & imaging results that were available during my care of the patient were reviewed by me and considered in my medical decision making (see  chart for details).    MDM Rules/Calculators/A&P                         Patient presents as aligned above she has been experiencing some nausea and lightheadedness.  Today she had chest pain and rapid atrial fibrillation with hypotension.  This converted spontaneously after EMS arrival and initiation of fluid resuscitation.  Patient reports some mild residual chest discomfort and lightheadedness.  EKG does not show acute ischemic pattern at this time.  We will need to proceed with labs and chest x-ray.  We will continue light hydration with normal saline 125 cc/h.  Will check orthostatic vital signs.  Patient symptoms concerning for possible anginal equivalent with nausea and lightheadedness.  Will monitor for rhythm and anticipate overnight observation for rule out MI and monitoring for dysrhythmia.  Final Clinical Impression(s) / ED Diagnoses Final diagnoses:  Paroxysmal atrial fibrillation (HCC)  Precordial pain    Rx / DC Orders ED Discharge Orders    None       [name], MD 03/03/20 [phone]

## 2020-03-03 NOTE — ED Triage Notes (Signed)
Pt called ems for cp/sob. Was nauseated and diaphoretic. Pt was afib and hypotensive 88/32 pta, HR 160. Pt given 517ml fluid bolus pta and converted to sinus rhythm rate of 83 and bp 120/57. Pt reports now having no chest pain, only a headache. No acute distress is noted on arrival.

## 2020-03-03 NOTE — H&P (Signed)
History and Physical    Diane Patterson LFY:[id number] DOB: 09-04-1945 DOA: 03/03/2020  PCP: Susy Frizzle, MD   Patient coming from:   Home  Chief Complaint: Chest pain, weakness  HPI: Diane Patterson is a 74 y.o. female with medical history significant for paroxysmal atrial fibrillation, hypertension, COPD who presents by EMS for complaints of chest pain associated with nausea, weakness and shortness of breath.  She reports that this morning she states she had low energy level was not "feeling well".  States he felt lightheaded but was not dizzy and did not have any loss of consciousness.  She then developed a substernal chest pain that was a 5-6 at its worst and did not radiate.  She describes the chest pain as a dull pressure under her sternum.  She reports the chest pressure was associated with shortness of breath, generalized weakness, nausea. She has not identified any aggravating factors. When the chest pressure continued despite sitting down and resting she called EMS. EMS found that she was in atrial fibrillation with a systolic blood pressure in the 80s. She was given a bolus of IV fluids and she spontaneously converted to sinus rhythm in route to the hospital.  She states that she had a stress test a number of years ago and she does not want to have stress test with dyes or chemicals done. She reports she is fully vaccinated for COVID-19 and had both vaccines 4 to 5 months ago She has a history of smoking but quit 4 years ago. She denies alcohol or illicit drug use.  ED Course:   She is found of elevated troponin in the emergency room. EKG does not show any ST elevation or depression. Cardiology was consulted by the ER doctor and is going to evaluate patient.  Review of Systems:  General: Ports weakness.  Reports profuse sweating with chest pain earlier.  Denies fever, chills, weight loss, night sweats.  Denies dizziness.  Denies change in appetite HENT: Denies head  trauma, headache, denies change in hearing, tinnitus.  Denies nasal congestion or bleeding.  Denies sore throat, sores in mouth.  Denies difficulty swallowing Eyes: Denies blurry vision, pain in eye, drainage.  Denies discoloration of eyes. Neck: Denies pain.  Denies swelling.  Denies pain with movement. Cardiovascular: Reports substernal chest pain.denies palpitations.  Denies edema.  Denies orthopnea Respiratory: Reports shortness of breath with cough.  Denies wheezing.  Denies sputum production Gastrointestinal: Reports nausea with no vomiting.  Denies abdominal pain, swelling.  Denies diarrhea.  Denies melena.  Denies hematemesis. Musculoskeletal: Denies limitation of movement.  Denies deformity or swelling.  Denies pain.  Denies arthralgias or myalgias. Genitourinary: Denies pelvic pain.  Denies urinary frequency or hesitancy.  Denies dysuria.  Skin: Denies rash.  Denies petechiae, purpura, ecchymosis. Neurological: Denies headache.  Denies syncope.  Denies seizure activity. Denies paresthesia. Denies slurred speech, drooping face.  Denies visual change. Psychiatric: Denies depression, anxiety.  Denies suicidal thoughts or ideation.  Denies hallucinations.  Past Medical History:  Diagnosis Date  . Asthma   . Chronic airflow obstruction (HCC)   . Dysrhythmia   . Hypertension   . Tobacco abuse   . Venous insufficiency     Past Surgical History:  Procedure Laterality Date  . cataract    . NECK SURGERY    . no colonoscopy     "afraid to "; [facility] reviewed  . VESICOVAGINAL FISTULA CLOSURE W/ TAH      Social History  reports that she  quit smoking about 5 years ago. Her smoking use included cigarettes. She has a 11.25 pack-year smoking history. She quit smokeless tobacco use about 5 years ago. She reports that she does not drink alcohol and does not use drugs.  No Known Allergies  Family History  Problem Relation Age of Onset  . Diabetes Mother   . Emphysema Father   . Asthma  Father   . Heart disease Father      Prior to Admission medications   Medication Sig Start Date End Date Taking? Authorizing Provider  acetaminophen (TYLENOL) 325 MG tablet Take 650 mg by mouth every 6 (six) hours as needed for mild pain.    Yes [provider]  albuterol (VENTOLIN HFA) 108 (90 Base) MCG/ACT inhaler Inhale 2 puffs into the lungs every 4 (four) hours as needed for wheezing or shortness of breath. 06/05/19  Yes Susy Frizzle, MD  apixaban (ELIQUIS) 5 MG TABS tablet Take 1 tablet (5 mg total) by mouth 2 (two) times daily. 12/25/19  Yes Jerline Pain, MD  budesonide-formoterol (SYMBICORT) 160-4.5 MCG/ACT inhaler INHALE 2 PUFFS INTO LUNGS TWICE DAILY Patient taking differently: Inhale 2 puffs into the lungs in the morning and at bedtime.  12/20/19  Yes Susy Frizzle, MD  diltiazem (CARDIZEM CD) 240 MG 24 hr capsule TAKE 1 CAPSULE(240 MG) BY MOUTH DAILY Patient taking differently: Take 240 mg by mouth daily.  11/07/19  Yes Jerline Pain, MD  hydrochlorothiazide (MICROZIDE) 12.5 MG capsule TAKE 2 CAPSULES(25 MG) BY MOUTH DAILY Patient taking differently: Take 25 mg by mouth daily.  11/07/19  Yes Jerline Pain, MD  potassium chloride SA (KLOR-CON M20) 20 MEQ tablet Take 1 tablet (20 mEq total) by mouth daily. 09/19/19  Yes Jerline Pain, MD  albuterol (PROVENTIL) (2.5 MG/3ML) 0.083% nebulizer solution Take 3 mLs (2.5 mg total) by nebulization every 6 (six) hours as needed for wheezing or shortness of breath. Patient not taking: Reported on 03/03/2020 05/26/18   Delsa Grana, PA-C  ipratropium-albuterol (DUONEB) 0.5-2.5 (3) MG/3ML SOLN Take 3 mLs by nebulization 3 (three) times daily. Dx J44.9 Patient not taking: Reported on 03/03/2020 05/24/19   Parrett, Fonnie Mu, NP  Respiratory Therapy Supplies (FLUTTER) DEVI Use as directed. 05/25/19   Parrett, Fonnie Mu, NP  Tiotropium Bromide Monohydrate (SPIRIVA RESPIMAT) 2.5 MCG/ACT AERS Inhale 2 puffs into the lungs daily. Patient not  taking: Reported on 03/03/2020 06/05/19   Susy Frizzle, MD    Physical Exam: Vitals:   03/03/20 1900 03/03/20 1930 03/03/20 1936 03/03/20 2000  BP: (!) 172/67 (!) 144/59 140/65 (!) 169/71  Pulse: 70 61 76 71  Resp: (!) 21 (!) 24 (!) 26 (!) 22  Temp:   98.2 F (36.8 C)   TempSrc:   Oral   SpO2: 100% (!) 89% 100% 100%    Constitutional: NAD, calm, comfortable Vitals:   03/03/20 1900 03/03/20 1930 03/03/20 1936 03/03/20 2000  BP: (!) 172/67 (!) 144/59 140/65 (!) 169/71  Pulse: 70 61 76 71  Resp: (!) 21 (!) 24 (!) 26 (!) 22  Temp:   98.2 F (36.8 C)   TempSrc:   Oral   SpO2: 100% (!) 89% 100% 100%   General: WDWN, Alert and oriented x3.  Eyes: EOMI, PERRL, lids and conjunctivae normal.  Sclera nonicteric HENT:  [place]/AT, external ears normal.  Nares patent without epistasis.  Mucous membranes are moist. Posterior pharynx clear of any exudate or lesions Neck: Soft, normal range of motion, supple,  no masses, no thyromegaly.  Trachea midline Respiratory: Rales.  Diffuse expiratory wheezing.  No rhonchi, no crackles. Normal respiratory effort. No accessory muscle use.  Cardiovascular: Regular rate and rhythm, no murmurs / rubs / gallops. No extremity edema. 1+ pedal pulses.  Abdomen: Soft, no tenderness, nondistended, no rebound or guarding.  No masses palpated. No hepatosplenomegaly. Bowel sounds normoactive Musculoskeletal: FROM. no cyanosis. No joint deformity upper and lower extremities. Normal muscle tone.  Skin: Warm, dry, intact no rashes, lesions, ulcers. No induration Neurologic: CN 2-12 grossly intact.  Normal speech.  Sensation intact, Strength 5/5 in all extremities.   Psychiatric: Normal judgment and insight.  Normal mood.    Labs on Admission: I have personally reviewed following labs and imaging studies  CBC: Recent Labs  Lab 03/03/20 1511  WBC 10.3  HGB 12.0  HCT 38.9  MCV 93.5  PLT 696    Basic Metabolic Panel: Recent Labs  Lab 03/03/20 1511  NA 137    K 3.8  CL 99  CO2 27  GLUCOSE 100*  BUN 15  CREATININE 0.93  CALCIUM 9.1    GFR: CrCl cannot be calculated (Unknown ideal weight.).  Liver Function Tests: Recent Labs  Lab 03/03/20 1511  AST 18  ALT 14  ALKPHOS 66  BILITOT 0.7  PROT 6.2*  ALBUMIN 3.3*    Urine analysis:    Component Value Date/Time   COLORURINE YELLOW 03/03/2020 1718   APPEARANCEUR CLEAR 03/03/2020 1718   LABSPEC 1.009 03/03/2020 1718   PHURINE 6.0 03/03/2020 1718   GLUCOSEU NEGATIVE 03/03/2020 1718   GLUCOSEU NEGATIVE 07/05/2011 1100   HGBUR NEGATIVE 03/03/2020 1718   BILIRUBINUR NEGATIVE 03/03/2020 1718   KETONESUR 5 (A) 03/03/2020 1718   PROTEINUR NEGATIVE 03/03/2020 1718   UROBILINOGEN 0.2 07/05/2011 1100   NITRITE NEGATIVE 03/03/2020 1718   LEUKOCYTESUR NEGATIVE 03/03/2020 1718    Radiological Exams on Admission: DG Chest 2 View  Result Date: 03/03/2020 CLINICAL DATA:  Chest pain. EXAM: CHEST - 2 VIEW COMPARISON:  Chest x-ray dated Nov 05, 2019. FINDINGS: The heart size and mediastinal contours are within normal limits. Atherosclerotic calcification of the aortic arch. Normal pulmonary vascularity. The lungs remain hyperinflated with emphysematous changes. No focal consolidation, pleural effusion, or pneumothorax. No acute osseous abnormality. Chronic T12 compression deformity. IMPRESSION: 1. No acute cardiopulmonary disease. 2. COPD. Electronically Signed   By: Titus Dubin M.D.   On: 03/03/2020 12:28    EKG: Independently reviewed.  EKG is reviewed.  Normal sinus rhythm with no acute ST elevation or depression.  No acute ST changes.  QTc is 457  Assessment/Plan Principal Problem:   Chest pain Patient replaced on cardiac telemetry for observation for chest pain.  Obtain serial troponin levels. Cardiology consulted and awaiting evaluation by Dr. Marletta Lor. Continue eliquis for anticoagulation. Check lipid panel.  Monitor blood pressure.  Nitroglycerin as needed.  Pulmonal oxygen as needed to  maintain O2 sat between 92-96%  Patient states that she does not want to have a stress test with dye or medications. Stress echocardiogram ordered for treadmill.  Active Problems:   COPD (chronic obstructive pulmonary disease) ([place]) Patient is provided with [facility] in place of Symbicort per hospital formulary.  Albuterol MDI as needed for cough, shortness of breath, wheezing every 4 hours. Incentive spirometer every 2 hours while awake    Essential hypertension  Continue diltiazem and hydrochlorothiazide.  Monitor blood pressure.    Paroxysmal A-fib [facility]) Patient is now back in normal sinus rhythm.  To new  home dose of diltiazem.  Continue to monitor on cardiac telemetry    DVT prophylaxis: Diane Patterson is on Eliquis for anticoagulation with history of paroxysmal atrial fibrillation.  Eliquis will be continued Code Status:   Full code Family Communication:  Diagnosis and plan discussed with patient.  Patient verbalized understanding agrees with plan.  Further recommendation to follow as clinically indicated. Disposition Plan:   Patient is from:  Home  Anticipated DC to:  Home  Anticipated DC date:  Anticipate less than 2 midnight stay in the hospital  Anticipated DC barriers: No barriers to discharge identified at this time  Consults called:  Cardiology consult, Dr. Marletta Lor, was contacted by ER provider Admission status:  Observation  Severity of Illness: The appropriate patient status for this patient is OBSERVATION. Observation status is judged to be reasonable and necessary in order to provide the required intensity of service to ensure the patient's safety. The patient's presenting symptoms, physical exam findings, and initial radiographic and laboratory data in the context of their medical condition is felt to place them at decreased risk for further clinical deterioration. Furthermore, it is anticipated that the patient will be medically stable for discharge from the hospital within  2 midnights of admission. The following factors support the patient status of observation.    Yevonne Aline Lizvette Lightsey MD [facility]  How to contact the [facility] Attending or Consulting provider [place] or covering provider during after hours [place], for this patient?   1. Check the care team in [facility] and look for a) attending/consulting [facility] provider listed and b) the [facility] team listed 2. Log into [email or web address] and use [place] universal password to access. If you do not have the password, please contact the hospital operator. 3. Locate the [facility] provider you are looking for under [facility] and page to a number that you can be directly reached. 4. If you still have difficulty reaching the provider, please page the [facility] (Director on Call) for the Hospitalists listed on amion for assistance.  03/03/2020, 10:39 PM

## 2020-03-04 ENCOUNTER — Observation Stay (HOSPITAL_BASED_OUTPATIENT_CLINIC_OR_DEPARTMENT_OTHER): Payer: Medicare Other

## 2020-03-04 DIAGNOSIS — I480 Paroxysmal atrial fibrillation: Secondary | ICD-10-CM | POA: Diagnosis not present

## 2020-03-04 DIAGNOSIS — I10 Essential (primary) hypertension: Secondary | ICD-10-CM | POA: Diagnosis not present

## 2020-03-04 DIAGNOSIS — R079 Chest pain, unspecified: Secondary | ICD-10-CM | POA: Diagnosis not present

## 2020-03-04 DIAGNOSIS — J439 Emphysema, unspecified: Secondary | ICD-10-CM | POA: Diagnosis not present

## 2020-03-04 LAB — ECHOCARDIOGRAM COMPLETE
Area-P 1/2: 3.51 cm2
Calc EF: 67.7 %
S' Lateral: 2.05 cm
Single Plane A2C EF: 72.9 %
Single Plane A4C EF: 59.8 %

## 2020-03-04 LAB — TROPONIN I (HIGH SENSITIVITY)
Troponin I (High Sensitivity): 154 ng/L (ref <18)
Troponin I (High Sensitivity): 114 ng/L (ref <18)

## 2020-03-04 LAB — LIPID PANEL
Cholesterol: 164 mg/dL (ref 0–200)
HDL: 58 mg/dL (ref >40)
LDL Cholesterol: 87 mg/dL (ref 0–99)
Total CHOL/HDL Ratio: 2.8 RATIO
Triglycerides: 93 mg/dL (ref <150)
VLDL: 19 mg/dL (ref 0–40)

## 2020-03-04 LAB — SARS CORONAVIRUS 2 BY RT PCR (HOSPITAL ORDER, PERFORMED IN ~~LOC~~ HOSPITAL LAB): SARS Coronavirus 2: NEGATIVE

## 2020-03-04 NOTE — Care Management (Signed)
Patient has home oxygen with [place] , and needs tank to use to go home. Called Zach with [employer] to bring portable oxygen tank to room.   Magdalen Spatz RN

## 2020-03-04 NOTE — Discharge Summary (Signed)
Physician Discharge Summary  Diane Patterson RSW:[id number] DOB: [birth year] DOA: 03/03/2020  PCP: Susy Frizzle, MD  Admit date: 03/03/2020 Discharge date: 03/04/2020  Admitted From: Home Disposition: Home  Recommendations for Outpatient Follow-up:  1. Follow up with PCP in 1-2 weeks 2. Follow-up with cardiology, Dr. Marlou Porch in 2 weeks 3. Discontinue HCTZ  Home Health: No Equipment/Devices: Continues on her home supple oxygen, 2 L per nasal cannula  Discharge Condition: Stable CODE STATUS: Full code Diet recommendation: Heart healthy diet  History of present illness:  Diane Patterson Is a 74 year old female with past medical history notable for paroxysmal atrial fibrillation, essential hypertension, COPD on 2 L nasal cannula who presented via EMS for chest pain associated with nausea, weakness and shortness of breath.  Patient was noted to have an elevated troponin in the ED with EKG findings of A. fib with RVR.  Cardiology was consulted and [facility] consulted for admission.   Hospital course:  Paroxysmal atrial fibrillation with RVR Patient presenting to the ED via EMS with chest pain, shortness of breath and weakness.  Was found to be in A. fib with RVR.  Patient with poor oral intake over the past 4 weeks due to recent husband's passing.  Patient was hydrated with IV fluid, with spontaneous conversion back to normal rate.  Cardiology was consulted and followed during hospital course.  She was resumed on her home Cardizem 240 mg p.o. daily.  Continues on apixaban for anticoagulation.  She was monitored overnight on telemetry in which her atrial fibrillation remained well controlled.  Outpatient follow-up with cardiology, Dr. Marlou Porch in 2 weeks.  NSTEMI secondary to type II demand ischemia Patient presenting with chest pain as above, A. fib with RVR.  Etiology likely demand type II ischemia from accelerated rate.  EKG with no concerning ST elevation/depression.  Troponin was  trended during the hospitalization with peak Qaiser sensitive troponin at 154 which trended down.  TTE with LVEF 60 to 65% and no regional wall motion abnormalities LV and grade 2 diastolic dysfunction.  Patient's rate now well controlled after IV fluid bolus and continuation of her home Cardizem.  Patient encouraged to maintain hydration.  Follow-up with cardiologist, Dr. Marlou Porch in 2 weeks.  Hypotension Upon presentation, patient's blood pressure was noted to be slightly low likely secondary to a combination of A. fib with RVR and decreased oral intake from husband's recent passing 4 weeks prior.  She has not been eating or drinking well at home.  Also patient on Cardizem and HCTZ outpatient.  Cardiology recommended discontinuation of HCTZ at this time.  Blood pressures were monitored closely throughout the remainder the hospitalization which were much improved.  Outpatient follow-up PCP.  Chronic hypoxic respiratory failure on 2 L nasal cannula baseline COPD Chest x-ray with emphysematous changes, no acute cardiopulmonary findings with chronic T12 compression fracture.  Patient is oxygenating well on her home 2 L nasal cannula.  Continue home inhalers.  Discharge Diagnoses:  Active Problems:   COPD (chronic obstructive pulmonary disease) (HCC)   Essential hypertension   Paroxysmal A-fib [facility])    Discharge Instructions  Discharge Instructions    Call MD for:  difficulty breathing, headache or visual disturbances   Complete by: As directed    Call MD for:  extreme fatigue   Complete by: As directed    Call MD for:  persistant dizziness or light-headedness   Complete by: As directed    Call MD for:  persistant nausea and vomiting   Complete  by: As directed    Call MD for:  severe uncontrolled pain   Complete by: As directed    Call MD for:  temperature >100.4   Complete by: As directed    Diet - low sodium heart healthy   Complete by: As directed    Increase activity slowly    Complete by: As directed      Allergies as of 03/04/2020   No Known Allergies     Medication List    STOP taking these medications   hydrochlorothiazide 12.5 MG capsule Commonly known as: MICROZIDE   ipratropium-albuterol 0.5-2.5 (3) MG/3ML Soln Commonly known as: DUONEB   Spiriva Respimat 2.5 MCG/ACT Aers Generic drug: Tiotropium Bromide Monohydrate     TAKE these medications   acetaminophen 325 MG tablet Commonly known as: TYLENOL Take 650 mg by mouth every 6 (six) hours as needed for mild pain.   albuterol 108 (90 Base) MCG/ACT inhaler Commonly known as: VENTOLIN HFA Inhale 2 puffs into the lungs every 4 (four) hours as needed for wheezing or shortness of breath. What changed: Another medication with the same name was removed. Continue taking this medication, and follow the directions you see here.   apixaban 5 MG Tabs tablet Commonly known as: Eliquis Take 1 tablet (5 mg total) by mouth 2 (two) times daily.   budesonide-formoterol 160-4.5 MCG/ACT inhaler Commonly known as: Symbicort INHALE 2 PUFFS INTO LUNGS TWICE DAILY What changed:   how much to take  how to take this  when to take this  additional instructions   diltiazem 240 MG 24 hr capsule Commonly known as: CARDIZEM CD TAKE 1 CAPSULE(240 MG) BY MOUTH DAILY What changed: See the new instructions.   Flutter Devi Use as directed.   potassium chloride SA 20 MEQ tablet Commonly known as: Klor-Con M20 Take 1 tablet (20 mEq total) by mouth daily.       Follow-up Information    Susy Frizzle, MD. Schedule an appointment as soon as possible for a visit in 1 week(s).   Specialty: Family Medicine Contact information: [address] [place] [address] [phone]        Jerline Pain, MD. Schedule an appointment as soon as possible for a visit in 2 week(s).   Specialty: Cardiology Contact information: [address] [place] [address] [phone]               No Known Allergies  Consultations:  Cardiology, Dr. Stanford Breed   Procedures/Studies: DG Chest 2 View  Result Date: 03/03/2020 CLINICAL DATA:  Chest pain. EXAM: CHEST - 2 VIEW COMPARISON:  Chest x-ray dated Nov 05, 2019. FINDINGS: The heart size and mediastinal contours are within normal limits. Atherosclerotic calcification of the aortic arch. Normal pulmonary vascularity. The lungs remain hyperinflated with emphysematous changes. No focal consolidation, pleural effusion, or pneumothorax. No acute osseous abnormality. Chronic T12 compression deformity. IMPRESSION: 1. No acute cardiopulmonary disease. 2. COPD. Electronically Signed   By: Titus Dubin M.D.   On: 03/03/2020 12:28   ECHOCARDIOGRAM COMPLETE  Result Date: 03/04/2020    ECHOCARDIOGRAM REPORT   Patient Name:   ADRIANNA DUDAS Date of Exam: 03/04/2020 Medical Rec #:  [id number]           Height:       67.0 in Accession #:    [id number]          Weight:       115.0 lb Date of Birth:  [birth year]          BSA:          1.598 m Patient Age:    88 years            BP:           175/65 mmHg Patient Gender: F                   HR:           57 bpm. Exam Location:  Inpatient Procedure: 2D Echo, Cardiac Doppler and Color Doppler Indications:    I48.91* Unspeicified atrial fibrillation  History:        Patient has prior history of Echocardiogram examinations, most                 recent 07/15/2017. Abnormal ECG, COPD, Arrythmias:Atrial                 Fibrillation, Signs/Symptoms:Chest Pain; Risk                 Factors:Hypertension, Dyslipidemia, Former Smoker and Diabetes.  Sonographer:    Roseanna Rainbow RDCS Referring Phys: [id number] Milus Banister  Sonographer Comments: Technically difficult study due to poor echo windows. Image acquisition challenging due to COPD. Patient was sensitive to pressure from probe. IMPRESSIONS  1. Left ventricular ejection fraction, by estimation, is 60 to 65%. The left ventricle has normal function. The left  ventricle has no regional wall motion abnormalities. There is mild concentric left ventricular hypertrophy. Left ventricular diastolic parameters are consistent with Grade II diastolic dysfunction (pseudonormalization). Elevated left atrial pressure.  2. Right ventricular systolic function is normal. The right ventricular size is normal.  3. The mitral valve is normal in structure. Mild mitral valve regurgitation. No evidence of mitral stenosis.  4. The aortic valve is normal in structure. Aortic valve regurgitation is not visualized. No aortic stenosis is present.  5. The inferior vena cava is dilated in size with >50% respiratory variability, suggesting right atrial pressure of 8 mmHg. Comparison(s): No significant change from prior study. Prior images reviewed side by side. FINDINGS  Left Ventricle: Left ventricular ejection fraction, by estimation, is 60 to 65%. The left ventricle has normal function. The left ventricle has no regional wall motion abnormalities. The left ventricular internal cavity size was normal in size. There is  mild concentric left ventricular hypertrophy. Left ventricular diastolic parameters are consistent with Grade II diastolic dysfunction (pseudonormalization). Elevated left atrial pressure. Right Ventricle: The right ventricular size is normal. No increase in right ventricular wall thickness. Right ventricular systolic function is normal. Left Atrium: Left atrial size was normal in size. Right Atrium: Right atrial size was normal in size. Pericardium: There is no evidence of pericardial effusion. Mitral Valve: The mitral valve is normal in structure. Mild mitral valve regurgitation. No evidence of mitral valve stenosis. Tricuspid Valve: The tricuspid valve is normal in structure. Tricuspid valve regurgitation is not demonstrated. No evidence of tricuspid stenosis. Aortic Valve: The aortic valve is normal in structure. Aortic valve regurgitation is not visualized. No aortic stenosis is  present. Pulmonic Valve: The pulmonic valve was normal in structure. Pulmonic valve regurgitation is not visualized. No evidence of pulmonic stenosis. Aorta: The aortic root is normal in size and structure. Venous: The inferior vena cava is dilated in size with greater than 50% respiratory variability, suggesting right atrial pressure of 8 mmHg. IAS/Shunts: No atrial level shunt detected by color flow Doppler.  LEFT VENTRICLE PLAX  2D LVIDd:         3.90 cm     Diastology LVIDs:         2.05 cm     LV e' medial:    7.40 cm/s LV PW:         1.30 cm     LV E/e' medial:  15.0 LV IVS:        1.30 cm     LV e' lateral:   6.42 cm/s LVOT diam:     1.90 cm     LV E/e' lateral: 17.3 LV SV:         74 LV SV Index:   46 LVOT Area:     2.84 cm  LV Volumes (MOD) LV vol d, MOD A2C: 64.6 ml LV vol d, MOD A4C: 61.5 ml LV vol s, MOD A2C: 17.5 ml LV vol s, MOD A4C: 24.7 ml LV SV MOD A2C:     47.1 ml LV SV MOD A4C:     61.5 ml LV SV MOD BP:      43.8 ml RIGHT VENTRICLE            IVC RV S prime:     9.03 cm/s  IVC diam: 2.30 cm TAPSE (M-mode): 2.1 cm LEFT ATRIUM             Index       RIGHT ATRIUM           Index LA diam:        2.30 cm 1.44 cm/m  RA Area:     11.20 cm LA Vol (A2C):   59.2 ml 37.04 ml/m RA Volume:   27.40 ml  17.14 ml/m LA Vol (A4C):   19.9 ml 12.45 ml/m LA Biplane Vol: 37.1 ml 23.21 ml/m  AORTIC VALVE LVOT Vmax:   121.00 cm/s LVOT Vmean:  74.300 cm/s LVOT VTI:    0.261 m  AORTA Ao Root diam: 3.20 cm Ao Asc diam:  2.60 cm MITRAL VALVE MV Area (PHT): 3.51 cm     SHUNTS MV Decel Time: 216 msec     Systemic VTI:  0.26 m MV E velocity: 111.00 cm/s  Systemic Diam: 1.90 cm MV A velocity: 99.00 cm/s MV E/A ratio:  1.12 Mihai Croitoru MD Electronically signed by Sanda Klein MD Signature Date/Time: 03/04/2020/2:32:51 PM    Final       Subjective: Patient seen and examined bedside, resting comfortably.  States chest pain and shortness of breath have resolved.  Her heart rate has normalized to a normal rate.   Seen by cardiology with recommendations of discontinuing HCTZ.  TTE with normal LVEF.  Patient without any other concerns or complaints at this time.  Denies headache, no fever/chills/night sweats, no nausea/vomiting/diarrhea, no chest pain, palpitations, no shortness of breath, no abdominal pain.  No acute events overnight per nursing staff.  Discharge Exam: Vitals:   03/04/20 0806 03/04/20 1244  BP:  (!) 133/58  Pulse:  78  Resp:  16  Temp:  97.7 F (36.5 C)  SpO2: 98% 100%   Vitals:   03/04/20 0318 03/04/20 0400 03/04/20 0806 03/04/20 1244  BP: (!) 148/57 (!) 175/65  (!) 133/58  Pulse: 69 72  78  Resp: 20 16  16   Temp: 98.3 F (36.8 C) 97.9 F (36.6 C)  97.7 F (36.5 C)  TempSrc: Oral Oral  Oral  SpO2: 100% 97% 98% 100%    General: Pt is alert, awake, not in acute distress  Cardiovascular: RRR, S1/S2 +, no rubs, no gallops Respiratory: CTA bilaterally, no wheezing, no rhonchi, on 2 L nasal cannula with SPO2 97%. Abdominal: Soft, NT, ND, bowel sounds + Extremities: no edema, no cyanosis    The results of significant diagnostics from this hospitalization (including imaging, microbiology, ancillary and laboratory) are listed below for reference.     Microbiology: Recent Results (from the past 240 hour(s))  SARS Coronavirus 2 by RT PCR (hospital order, performed in [facility] hospital lab) Nasopharyngeal Nasopharyngeal Swab     Status: None   Collection Time: 03/04/20 12:10 AM   Specimen: Nasopharyngeal Swab  Result Value Ref Range Status   SARS Coronavirus 2 NEGATIVE NEGATIVE Final    Comment: (NOTE) SARS-CoV-2 target nucleic acids are NOT DETECTED.  The SARS-CoV-2 RNA is generally detectable in upper and lower respiratory specimens during the acute phase of infection. The lowest concentration of SARS-CoV-2 viral copies this assay can detect is 250 copies / mL. A negative result does not preclude SARS-CoV-2 infection and should not be used as the sole basis for  treatment or other patient management decisions.  A negative result may occur with improper specimen collection / handling, submission of specimen other than nasopharyngeal swab, presence of viral mutation(s) within the areas targeted by this assay, and inadequate number of viral copies (<250 copies / mL). A negative result must be combined with clinical observations, patient history, and epidemiological information.  Fact Sheet for Patients:   StrictlyIdeas.no  Fact Sheet for Healthcare Providers: BankingDealers.co.za  This test is not yet approved or  cleared by the [place] FDA and has been authorized for detection and/or diagnosis of SARS-CoV-2 by FDA under an Emergency Use Authorization (EUA).  This EUA will remain in effect (meaning this test can be used) for the duration of the COVID-19 declaration under Section 564(b)(1) of the Act, 21 U.S.C. section 360bbb-3(b)(1), unless the authorization is terminated or revoked sooner.  Performed at [facility], [place] [address], [place],  [place] [address]      Labs: BNP (last 3 results) Recent Labs    03/03/20 1511  BNP 77.8   Basic Metabolic Panel: Recent Labs  Lab 03/03/20 1511  NA 137  K 3.8  CL 99  CO2 27  GLUCOSE 100*  BUN 15  CREATININE 0.93  CALCIUM 9.1   Liver Function Tests: Recent Labs  Lab 03/03/20 1511  AST 18  ALT 14  ALKPHOS 66  BILITOT 0.7  PROT 6.2*  ALBUMIN 3.3*   Recent Labs  Lab 03/03/20 1511  LIPASE 36   No results for input(s): AMMONIA in the last 168 hours. CBC: Recent Labs  Lab 03/03/20 1511  WBC 10.3  HGB 12.0  HCT 38.9  MCV 93.5  PLT 327   Cardiac Enzymes: No results for input(s): CKTOTAL, CKMB, CKMBINDEX, TROPONINI in the last 168 hours. BNP: Invalid input(s): POCBNP CBG: No results for input(s): GLUCAP in the last 168 hours. D-Dimer No results for input(s): DDIMER in the last 72 hours. Hgb A1c No results  for input(s): HGBA1C in the last 72 hours. Lipid Profile Recent Labs    03/03/20 2313  CHOL 164  HDL 58  LDLCALC 87  TRIG 93  CHOLHDL 2.8   Thyroid function studies Recent Labs    03/03/20 1511  TSH 1.705   Anemia work up No results for input(s): VITAMINB12, FOLATE, FERRITIN, TIBC, IRON, RETICCTPCT in the last 72 hours. Urinalysis    Component Value Date/Time   COLORURINE YELLOW  03/03/2020 [place] 03/03/2020 1718   LABSPEC 1.009 03/03/2020 1718   PHURINE 6.0 03/03/2020 1718   GLUCOSEU NEGATIVE 03/03/2020 1718   GLUCOSEU NEGATIVE 07/05/2011 1100   HGBUR NEGATIVE 03/03/2020 1718   BILIRUBINUR NEGATIVE 03/03/2020 1718   KETONESUR 5 (A) 03/03/2020 1718   PROTEINUR NEGATIVE 03/03/2020 1718   UROBILINOGEN 0.2 07/05/2011 1100   NITRITE NEGATIVE 03/03/2020 1718   LEUKOCYTESUR NEGATIVE 03/03/2020 1718   Sepsis Labs Invalid input(s): PROCALCITONIN,  WBC,  LACTICIDVEN Microbiology Recent Results (from the past 240 hour(s))  SARS Coronavirus 2 by RT PCR (hospital order, performed in [place] hospital lab) Nasopharyngeal Nasopharyngeal Swab     Status: None   Collection Time: 03/04/20 12:10 AM   Specimen: Nasopharyngeal Swab  Result Value Ref Range Status   SARS Coronavirus 2 NEGATIVE NEGATIVE Final    Comment: (NOTE) SARS-CoV-2 target nucleic acids are NOT DETECTED.  The SARS-CoV-2 RNA is generally detectable in upper and lower respiratory specimens during the acute phase of infection. The lowest concentration of SARS-CoV-2 viral copies this assay can detect is 250 copies / mL. A negative result does not preclude SARS-CoV-2 infection and should not be used as the sole basis for treatment or other patient management decisions.  A negative result may occur with improper specimen collection / handling, submission of specimen other than nasopharyngeal swab, presence of viral mutation(s) within the areas targeted by this assay, and inadequate number of  viral copies (<250 copies / mL). A negative result must be combined with clinical observations, patient history, and epidemiological information.  Fact Sheet for Patients:   StrictlyIdeas.no  Fact Sheet for Healthcare Providers: BankingDealers.co.za  This test is not yet approved or  cleared by the [place] FDA and has been authorized for detection and/or diagnosis of SARS-CoV-2 by FDA under an Emergency Use Authorization (EUA).  This EUA will remain in effect (meaning this test can be used) for the duration of the COVID-19 declaration under Section 564(b)(1) of the Act, 21 U.S.C. section 360bbb-3(b)(1), unless the authorization is terminated or revoked sooner.  Performed at [facility], [place] [address] [place], [place] [address]      Time coordinating discharge: Over 30 minutes  SIGNED:   Barri Neidlinger J [place] DO  [facility] 03/04/2020, 2:56 PM

## 2020-03-04 NOTE — Evaluation (Signed)
Physical Therapy Evaluation Patient Details Name: Diane Patterson MRN: [id number] DOB: 09-16-1945 Today's Date: 03/04/2020   History of Present Illness  Patient is a 74 y/o female who presents with CP, SOB and weakness. Found to have A-fib with RVR and hypotension. PMH includes COPD/asthma, HTN, venous insufficiency,.  Clinical Impression  Patient presents with dyspnea on exertion, decreased activity tolerance and impaired mobility s/p above. Pt lives alone and is independent for ambulation and ADLs PTA. Recently lost her spouse ~4 weeks ago. Has difficulty with cooking due to SOB. Today, tolerated bed mobility, transfers and gait training with supervision for safety and use of RW for support. VSS on 2L/min 02 [place], HR 90s-108 bpm, Sp02 high 90s. Will follow acutely to maximize independence and mobility prior to return home.     Follow Up Recommendations No PT follow up;Supervision - Intermittent    Equipment Recommendations  None recommended by PT    Recommendations for Other Services       Precautions / Restrictions Precautions Precautions: Fall Precaution Comments: watch HR, 02 Restrictions Weight Bearing Restrictions: No      Mobility  Bed Mobility Overal bed mobility: Needs Assistance Bed Mobility: Supine to Sit     Supine to sit: Modified independent (Device/Increase time);HOB elevated     General bed mobility comments: No assist needed, increased time.  Transfers Overall transfer level: Needs assistance Equipment used: Rolling walker (2 wheeled) Transfers: Sit to/from Stand Sit to Stand: Supervision         General transfer comment: Supervision for safety. Stood from [employer], from toilet x1.  Ambulation/Gait Ambulation/Gait assistance: Supervision Gait Distance (Feet): 120 Feet Assistive device: Rolling walker (2 wheeled) Gait Pattern/deviations: Step-through pattern;Decreased stride length;Trunk flexed   Gait velocity interpretation: 1.31 - 2.62  ft/sec, indicative of limited community ambulator General Gait Details: Slow mostly steady gait using RW for hallway ambulation and no RW for within room ambulation. 2-3/4 DOE. VSS on 2L/min o2 [place]. HR 90s-108 bpm. Sp02 >98%.  Stairs            Wheelchair Mobility    Modified Rankin (Stroke Patients Only)       Balance Overall balance assessment: Needs assistance Sitting-balance support: Feet supported;No upper extremity supported Sitting balance-Leahy Scale: Good     Standing balance support: During functional activity Standing balance-Leahy Scale: Fair Standing balance comment: Able to stand at sink and wash hands without difficulty or LOB                             Pertinent Vitals/Pain Pain Assessment: No/denies pain    Home Living Family/patient expects to be discharged to:: Private residence Living Arrangements: Alone Available Help at Discharge: Family;Available PRN/intermittently (son lives down the road) Type of Home: House Home Access: Stairs to enter;Ramped entrance   Entrance Stairs-Number of Steps: 1 small step Home Layout:  (2 small steps to wash room) Home Equipment: Shower seat - built in;Grab bars - tub/shower;Hand held Tourist information centre manager - 2 wheels;Bedside commode;Wheelchair - manual      Prior Function Level of Independence: Independent         Comments: Does own ADLs. Drives. Has someone go with her to the grocery store. has someone come and clean the house. Wears 2L all the time.     Hand Dominance   Dominant Hand: Right    Extremity/Trunk Assessment   Upper Extremity Assessment Upper Extremity Assessment: Defer to OT evaluation  Lower Extremity Assessment Lower Extremity Assessment: Generalized weakness (but functional; some skin changes noted in distal part of LEs- "need to get those removed.")    Cervical / Trunk Assessment Cervical / Trunk Assessment: Kyphotic  Communication   Communication: No difficulties   Cognition Arousal/Alertness: Awake/alert Behavior During Therapy: WFL for tasks assessed/performed Overall Cognitive Status: Within Functional Limits for tasks assessed                                 General Comments: Spouse passed away ~4 weeks ago      General Comments General comments (skin integrity, edema, etc.): VSS on 2L/min 02 [place] during session.    Exercises     Assessment/Plan    PT Assessment Patient needs continued PT services  PT Problem List Decreased strength;Decreased mobility;Decreased activity tolerance;Cardiopulmonary status limiting activity       PT Treatment Interventions Therapeutic activities;Gait training;Therapeutic exercise;Patient/family education;Balance training;Functional mobility training    PT Goals (Current goals can be found in the Care Plan section)  Acute Rehab PT Goals Patient Stated Goal: to have someone help me with meals PT Goal Formulation: With patient Time For Goal Achievement: 03/18/20 Potential to Achieve Goals: Good    Frequency Min 3X/week   Barriers to discharge Decreased caregiver support lives alone    Co-evaluation               AM-PAC PT "6 Clicks" Mobility  Outcome Measure Help needed turning from your back to your side while in a flat bed without using bedrails?: None Help needed moving from lying on your back to sitting on the side of a flat bed without using bedrails?: None Help needed moving to and from a bed to a chair (including a wheelchair)?: None Help needed standing up from a chair using your arms (e.g., wheelchair or bedside chair)?: None Help needed to walk in hospital room?: None Help needed climbing 3-5 steps with a railing? : A Little 6 Click Score: 23    End of Session Equipment Utilized During Treatment: Gait belt;Oxygen Activity Tolerance: Patient tolerated treatment well Patient left: in bed;with call bell/phone within reach (sitting EOB) Nurse Communication: Mobility  status PT Visit Diagnosis: Difficulty in walking, not elsewhere classified (R26.2);Muscle weakness (generalized) (M62.81)    Time: 2951-8841 PT Time Calculation (min) (ACUTE ONLY): 30 min   Charges:   PT Evaluation $PT Eval Moderate Complexity: 1 Mod PT Treatments $Gait Training: 8-22 mins        Marisa Severin, PT, DPT Acute Rehabilitation Services Pager [phone] Office [place] 03/04/2020, 1:11 PM

## 2020-03-04 NOTE — Progress Notes (Signed)
Progress Note  Patient Name: Diane Patterson Date of Encounter: 03/04/2020  [facility] Cardiologist: Candee Furbish, MD   Subjective   No CP or dyspnea  Inpatient Medications    Scheduled Meds:  apixaban  5 mg Oral BID   diltiazem  240 mg Oral Daily   mometasone-formoterol  2 puff Inhalation BID   Continuous Infusions:  PRN Meds: acetaminophen, albuterol, ondansetron (ZOFRAN) IV   Vital Signs    Vitals:   03/04/20 0200 03/04/20 0239 03/04/20 0318 03/04/20 0400  BP: (!) 164/48 (!) 168/68 (!) 148/57 (!) 175/65  Pulse: 66 (!) 56 69 72  Resp: (!) 21 17 20 16   Temp:   98.3 F (36.8 C) 97.9 F (36.6 C)  TempSrc:   Oral Oral  SpO2: 100% 100% 100% 97%    Intake/Output Summary (Last 24 hours) at 03/04/2020 0742 Last data filed at 03/04/2020 0407 Gross per 24 hour  Intake 120 ml  Output --  Net 120 ml   Last 3 Weights 01/29/2020 12/20/2019 09/19/2019  Weight (lbs) 115 lb 117 lb 123 lb  Weight (kg) 52.164 kg 53.071 kg 55.792 kg      Telemetry    Sinus- Personally Reviewed  Physical Exam   GEN: No acute distress.   Neck: No JVD Cardiac: RRR, no murmurs, rubs, or gallops.  Respiratory: Diminished BS GI: Soft, nontender, non-distended  MS: No edema Neuro:  Nonfocal  Psych: Normal affect   Labs    High Sensitivity Troponin:   Recent Labs  Lab 03/03/20 1511 03/03/20 1730 03/03/20 2037 03/03/20 2313 03/04/20 0513  TROPONINIHS 102* 141* 101* 154* 114*      Chemistry Recent Labs  Lab 03/03/20 1511  NA 137  K 3.8  CL 99  CO2 27  GLUCOSE 100*  BUN 15  CREATININE 0.93  CALCIUM 9.1  PROT 6.2*  ALBUMIN 3.3*  AST 18  ALT 14  ALKPHOS 66  BILITOT 0.7  GFRNONAA >60  GFRAA >60  ANIONGAP 11     Hematology Recent Labs  Lab 03/03/20 1511  WBC 10.3  RBC 4.16  HGB 12.0  HCT 38.9  MCV 93.5  MCH 28.8  MCHC 30.8  RDW 13.2  PLT 327    BNP Recent Labs  Lab 03/03/20 1511  BNP 74.3      Radiology    DG Chest 2 View  Result  Date: 03/03/2020 CLINICAL DATA:  Chest pain. EXAM: CHEST - 2 VIEW COMPARISON:  Chest x-ray dated Nov 05, 2019. FINDINGS: The heart size and mediastinal contours are within normal limits. Atherosclerotic calcification of the aortic arch. Normal pulmonary vascularity. The lungs remain hyperinflated with emphysematous changes. No focal consolidation, pleural effusion, or pneumothorax. No acute osseous abnormality. Chronic T12 compression deformity. IMPRESSION: 1. No acute cardiopulmonary disease. 2. COPD. Electronically Signed   By: Titus Dubin M.D.   On: 03/03/2020 12:28    Patient Profile     74 y.o. female with past medical history of paroxysmal atrial fibrillation, home O2 dependent COPD, hypertension admitted with atrial fibrillation with rapid ventricular response and hypotension.  Assessment & Plan    1 paroxysmal atrial fibrillation-patient has converted to sinus rhythm.  We will continue Cardizem for rate control if atrial fibrillation recurs.  If she has recurrences in the future would likely need antiarrhythmic.  Continue apixaban 5 mg twice daily.  Note TSH normal.  Await echocardiogram for LV function.  2 mildly elevated troponin-likely demand ischemia in the setting of atrial fibrillation.  Await echocardiogram.  If LV function normal and unchanged compared to previous would discharge home on present medications.  We can arrange outpatient stress nuclear study for risk stratification.  3 hypotension-resolved.  Blood pressure actually slightly elevated this morning.  Continue Cardizem and we can adjust regimen as an outpatient.  Would continue off of hydrochlorothiazide.  At time of admission blood pressure was low likely secondary to a combination of atrial fibrillation and recent decreased p.o. intake (patient's husband passed away 4 weeks ago and she has not been eating/drinking well at home).  4 COPD-continue pulmonary toilet.  As outlined above patient can be discharged if LV  function normal on echocardiogram.  We will arrange outpatient stress nuclear study and then follow-up with Dr. Marlou Porch.  For questions or updates, please contact [place] Please consult [email or web address] for contact info under        Signed, Kirk Ruths, MD  03/04/2020, 7:42 AM

## 2020-03-04 NOTE — Progress Notes (Signed)
  Echocardiogram 2D Echocardiogram has been performed.  Diane Patterson 03/04/2020, 11:41 AM

## 2020-03-14 ENCOUNTER — Other Ambulatory Visit: Payer: Self-pay

## 2020-03-14 ENCOUNTER — Ambulatory Visit (INDEPENDENT_AMBULATORY_CARE_PROVIDER_SITE_OTHER): Payer: Medicare Other | Admitting: Family Medicine

## 2020-03-14 VITALS — BP 130/60 | HR 77 | Temp 98.5°F | Ht 67.0 in | Wt 111.0 lb

## 2020-03-14 DIAGNOSIS — I10 Essential (primary) hypertension: Secondary | ICD-10-CM | POA: Diagnosis not present

## 2020-03-14 DIAGNOSIS — I480 Paroxysmal atrial fibrillation: Secondary | ICD-10-CM

## 2020-03-14 DIAGNOSIS — Z23 Encounter for immunization: Secondary | ICD-10-CM

## 2020-03-14 DIAGNOSIS — J9611 Chronic respiratory failure with hypoxia: Secondary | ICD-10-CM

## 2020-03-14 NOTE — Progress Notes (Signed)
Subjective:    Patient ID: Diane Patterson, female    DOB: [birth year], 74 y.o.   MRN: [id number]  HPI  Patient was recently admitted to the hospital September 21 for chest pain shortness of breath and tachycardia.  She was found to be in atrial fibrillation with rapid ventricular response.  There was also thought that she could be dehydrated.  She received IV fluid and the patient spontaneously converted back to normal sinus rhythm.  She did have elevated troponin levels while in the hospital and was thought to have ischemic strain secondary to rapid ventricular response.  She has an appointment to see her cardiologist next week.  Today she is on Cardizem 240 mg daily.  She is back in normal sinus rhythm and her heart rate is well controlled at 70 beats a minute on my exam.  She denies any chest pain.  She denies any new shortness of breath beyond her baseline COPD that requires 2 L of oxygen daily.  She did discontinue hydrochlorothiazide as recommended by the hospital.  Her blood pressure remains well controlled today.  However she is still taking her potassium supplement although she is no longer on her diuretic.  She is also due for a flu shot. Past Medical History:  Diagnosis Date  . Asthma   . Chronic airflow obstruction (HCC)   . Dysrhythmia   . Hypertension   . Tobacco abuse   . Venous insufficiency    Past Surgical History:  Procedure Laterality Date  . cataract    . NECK SURGERY    . no colonoscopy     "afraid to "; [facility] reviewed  . VESICOVAGINAL FISTULA CLOSURE W/ TAH     Current Outpatient Medications on File Prior to Visit  Medication Sig Dispense Refill  . acetaminophen (TYLENOL) 325 MG tablet Take 650 mg by mouth every 6 (six) hours as needed for mild pain.     Marland Kitchen albuterol (VENTOLIN HFA) 108 (90 Base) MCG/ACT inhaler Inhale 2 puffs into the lungs every 4 (four) hours as needed for wheezing or shortness of breath. 18 g 3  . apixaban (ELIQUIS) 5 MG TABS tablet Take 1  tablet (5 mg total) by mouth 2 (two) times daily. 180 tablet 1  . budesonide-formoterol (SYMBICORT) 160-4.5 MCG/ACT inhaler INHALE 2 PUFFS INTO LUNGS TWICE DAILY (Patient taking differently: Inhale 2 puffs into the lungs in the morning and at bedtime. ) 10.2 Inhaler 5  . diltiazem (CARDIZEM CD) 240 MG 24 hr capsule TAKE 1 CAPSULE(240 MG) BY MOUTH DAILY (Patient taking differently: Take 240 mg by mouth daily. ) 90 capsule 1  . potassium chloride SA (KLOR-CON M20) 20 MEQ tablet Take 1 tablet (20 mEq total) by mouth daily. 90 tablet 3  . Respiratory Therapy Supplies (FLUTTER) DEVI Use as directed. 1 each 0   No current facility-administered medications on file prior to visit.   No Known Allergies Social History   Socioeconomic History  . Marital status: Married    Spouse name: Not on file  . Number of children: 1  . Years of education: Not on file  . Highest education level: Not on file  Occupational History  . Not on file  Tobacco Use  . Smoking status: Former Smoker    Packs/day: 0.75    Years: 15.00    Pack years: 11.25    Types: Cigarettes    Quit date: 03/20/2014    Years since quitting: 5.9  . Smokeless tobacco: Former Systems developer  Quit date: 03/11/2014  Vaping Use  . Vaping Use: Never used  Substance and Sexual Activity  . Alcohol use: No    Alcohol/week: 0.0 standard drinks  . Drug use: No  . Sexual activity: Not on file  Other Topics Concern  . Not on file  Social History Narrative  . Not on file   Social Determinants of Health   Financial Resource Strain:   . Difficulty of Paying Living Expenses: Not on file  Food Insecurity:   . Worried About Charity fundraiser in the Last Year: Not on file  . Ran Out of Food in the Last Year: Not on file  Transportation Needs:   . Lack of Transportation (Medical): Not on file  . Lack of Transportation (Non-Medical): Not on file  Physical Activity:   . Days of Exercise per Week: Not on file  . Minutes of Exercise per Session:  Not on file  Stress:   . Feeling of Stress : Not on file  Social Connections:   . Frequency of Communication with Friends and Family: Not on file  . Frequency of Social Gatherings with Friends and Family: Not on file  . Attends Religious Services: Not on file  . Active Member of Clubs or Organizations: Not on file  . Attends Archivist Meetings: Not on file  . Marital Status: Not on file  Intimate Partner Violence:   . Fear of Current or Ex-Partner: Not on file  . Emotionally Abused: Not on file  . Physically Abused: Not on file  . Sexually Abused: Not on file     Review of Systems  All other systems reviewed and are negative.      Objective:   Physical Exam Constitutional:      General: She is not in acute distress.    Appearance: Normal appearance. She is not toxic-appearing or diaphoretic.  Cardiovascular:     Rate and Rhythm: Normal rate and regular rhythm.     Heart sounds: Normal heart sounds. No murmur heard.  No friction rub. No gallop.   Pulmonary:     Effort: Pulmonary effort is normal. No respiratory distress.     Breath sounds: Decreased air movement present. No wheezing or rales.  Musculoskeletal:     Right lower leg: No edema.     Left lower leg: No edema.  Neurological:     Mental Status: She is alert.           Assessment & Plan:  Paroxysmal atrial fibrillation ([place]) - Plan: CBC with Differential/Platelet, COMPLETE METABOLIC PANEL WITH GFR  Chronic respiratory failure with hypoxia (HCC)  Benign essential HTN  Patient's blood pressure is still well controlled despite holding hydrochlorothiazide.  I recommended that she discontinue potassium as she is no longer on a diuretic.  Recheck potassium today with a CMP.  Patient is currently in normal sinus rhythm.  Her rate is appropriately controlled with diltiazem.  She denies any palpitations or syncope or near syncope.  She denies any shortness of breath.  Follow-up as planned with  cardiology.  Patient received her flu shot today.  I plan to see the patient back in 1 month for recheck and at that time I will recheck her potassium off the supplement.  At that point we discussed preventative care including bone density test.  Given her frail status, I do not feel that she is appropriate for cancer screening such as a colonoscopy or mammogram but I will discuss that more with  her at that point.  We will also be due to recheck her A1c.  Her last A1c in August was outstanding at 6.1.

## 2020-03-15 LAB — CBC WITH DIFFERENTIAL/PLATELET
Absolute Monocytes: 699 cells/uL (ref 200–950)
Basophils Absolute: 37 cells/uL (ref 0–200)
Basophils Relative: 0.4 %
Eosinophils Absolute: 258 cells/uL (ref 15–500)
Eosinophils Relative: 2.8 %
HCT: 34.8 % — ABNORMAL LOW (ref 35.0–45.0)
Hemoglobin: 11.4 g/dL — ABNORMAL LOW (ref 11.7–15.5)
Lymphs Abs: 1812 cells/uL (ref 850–3900)
MCH: 29.2 pg (ref 27.0–33.0)
MCHC: 32.8 g/dL (ref 32.0–36.0)
MCV: 89.2 fL (ref 80.0–100.0)
MPV: 11 fL (ref 7.5–12.5)
Monocytes Relative: 7.6 %
Neutro Abs: 6394 cells/uL (ref 1500–7800)
Neutrophils Relative %: 69.5 %
Platelets: 330 10*3/uL (ref 140–400)
RBC: 3.9 10*6/uL (ref 3.80–5.10)
RDW: 12.7 % (ref 11.0–15.0)
Total Lymphocyte: 19.7 %
WBC: 9.2 10*3/uL (ref 3.8–10.8)

## 2020-03-15 LAB — COMPLETE METABOLIC PANEL WITH GFR
AG Ratio: 1.7 (calc) (ref 1.0–2.5)
ALT: 11 U/L (ref 6–29)
AST: 12 U/L (ref 10–35)
Albumin: 3.8 g/dL (ref 3.6–5.1)
Alkaline phosphatase (APISO): 76 U/L (ref 37–153)
BUN: 16 mg/dL (ref 7–25)
CO2: 27 mmol/L (ref 20–32)
Calcium: 9.3 mg/dL (ref 8.6–10.4)
Chloride: 103 mmol/L (ref 98–110)
Creat: 0.79 mg/dL (ref 0.60–0.93)
GFR, Est African American: 85 mL/min/{1.73_m2} (ref >=60)
GFR, Est Non African American: 74 mL/min/{1.73_m2} (ref >=60)
Globulin: 2.2 g/dL (calc) (ref 1.9–3.7)
Glucose, Bld: 124 mg/dL — ABNORMAL HIGH (ref 65–99)
Potassium: 4.3 mmol/L (ref 3.5–5.3)
Sodium: 138 mmol/L (ref 135–146)
Total Bilirubin: 0.3 mg/dL (ref 0.2–1.2)
Total Protein: 6 g/dL — ABNORMAL LOW (ref 6.1–8.1)

## 2020-03-17 ENCOUNTER — Encounter: Payer: Self-pay | Admitting: *Deleted

## 2020-03-26 NOTE — Progress Notes (Deleted)
Cardiology Office Note  Date:  03/26/2020   ID:  Merian, Wroe 07/02/1945, MRN [id number]  PCP:  Susy Frizzle, MD  Cardiologist:  Dr. Marlou Porch _____________  Hospital follow-up  _____________   History of Present Illness: Diane Patterson is a 74 y.o. female with pmh of near syncope episode, asthma, HTN, tobacco use, COPD on 2L, venous insifficiency, paroxysmal afib on Eliquiswho presents for 6 month follow-up. Patient previously had a near syncope episode and losartan was discontinued. She had an event monitor with no evidence of bradycrdia. She is on Eliquis for Afib.   Patient was admitted 03/03/20 for hypotension and AF RVR. Husband had recently passed away. SBPs in the 80s improved with IVF and spontaneously converted to NSR en route to the ED. EKG was unchnaged. Hs troponin up to 141. CXR without edema.   Today, she denies symptoms of palpitations, chest pain, shortness of breath, orthopnea, PND, lower extremity edema, claudication, dizziness, presyncope, syncope, bleeding, or neurologic sequela. The patient is tolerating medications without difficulties and is otherwise without complaint today.    _____________   Past Medical History:  Diagnosis Date  . Asthma   . Chronic airflow obstruction (HCC)   . Dysrhythmia   . Hypertension   . Tobacco abuse   . Venous insufficiency    Past Surgical History:  Procedure Laterality Date  . cataract    . NECK SURGERY    . no colonoscopy     "afraid to "; [facility] reviewed  . VESICOVAGINAL FISTULA CLOSURE W/ TAH     _____________  Current Outpatient Medications  Medication Sig Dispense Refill  . acetaminophen (TYLENOL) 325 MG tablet Take 650 mg by mouth every 6 (six) hours as needed for mild pain.     Marland Kitchen albuterol (VENTOLIN HFA) 108 (90 Base) MCG/ACT inhaler Inhale 2 puffs into the lungs every 4 (four) hours as needed for wheezing or shortness of breath. 18 g 3  . apixaban (ELIQUIS) 5 MG TABS tablet Take 1 tablet  (5 mg total) by mouth 2 (two) times daily. 180 tablet 1  . budesonide-formoterol (SYMBICORT) 160-4.5 MCG/ACT inhaler INHALE 2 PUFFS INTO LUNGS TWICE DAILY (Patient taking differently: Inhale 2 puffs into the lungs in the morning and at bedtime. ) 10.2 Inhaler 5  . diltiazem (CARDIZEM CD) 240 MG 24 hr capsule TAKE 1 CAPSULE(240 MG) BY MOUTH DAILY (Patient taking differently: Take 240 mg by mouth daily. ) 90 capsule 1  . potassium chloride SA (KLOR-CON M20) 20 MEQ tablet Take 1 tablet (20 mEq total) by mouth daily. 90 tablet 3  . Respiratory Therapy Supplies (FLUTTER) DEVI Use as directed. 1 each 0   No current facility-administered medications for this visit.   _____________   Allergies:   Patient has no known allergies.  _____________   Social History:  The patient  reports that she quit smoking about 6 years ago. Her smoking use included cigarettes. She has a 11.25 pack-year smoking history. She quit smokeless tobacco use about 6 years ago. She reports that she does not drink alcohol and does not use drugs.  _____________   Family History:  The patient's family history includes Asthma in her father; Diabetes in her mother; Emphysema in her father; Heart disease in her father.  _____________   ROS:  Please see the history of present illness.   Positive for ***,   All other systems are reviewed and negative.  _____________   PHYSICAL EXAM: VS:  LMP  (LMP Unknown)  ,  BMI There is no height or weight on file to calculate BMI. GEN: Well nourished, well developed, in no acute distress  HEENT: normal  Neck: no JVD, carotid bruits, or masses Cardiac: RRR; no murmurs, rubs, or gallops. No clubbing, cyanosis, edema.  Radials/DP/PT 2+ and equal bilaterally.  Respiratory:  clear to auscultation bilaterally, normal work of breathing GI: soft, nontender, nondistended, + BS MS: no deformity or atrophy  Skin: warm and dry, no rash Neuro:  Strength and sensation are intact Psych: euthymic mood, full  affect _____________  EKG:   The ekg ordered today shows ***  Recent Labs: 03/03/2020: B Natriuretic Peptide 74.3; TSH 1.705 03/14/2020: ALT 11; BUN 16; Creat 0.79; Hemoglobin 11.4; Platelets 330; Potassium 4.3; Sodium 138  03/03/2020: Cholesterol 164; HDL 58; LDL Cholesterol 87; Total CHOL/HDL Ratio 2.8; Triglycerides 93; VLDL 19  Estimated Creatinine Clearance: 49 mL/min (by C-G formula based on SCr of 0.79 mg/dL).  Wt Readings from Last 3 Encounters:  03/14/20 111 lb (50.3 kg)  01/29/20 115 lb (52.2 kg)  12/20/19 117 lb (53.1 kg)     Echo 03/04/20  1. Left ventricular ejection fraction, by estimation, is 60 to 65%. The  left ventricle has normal function. The left ventricle has no regional  wall motion abnormalities. There is mild concentric left ventricular  hypertrophy. Left ventricular diastolic  parameters are consistent with Grade II diastolic dysfunction  (pseudonormalization). Elevated left atrial pressure.  2. Right ventricular systolic function is normal. The right ventricular  size is normal.  3. The mitral valve is normal in structure. Mild mitral valve  regurgitation. No evidence of mitral stenosis.  4. The aortic valve is normal in structure. Aortic valve regurgitation is  not visualized. No aortic stenosis is present.  5. The inferior vena cava is dilated in size with >50% respiratory  variability, suggesting right atrial pressure of 8 mmHg.   _____________   ASSESSMENT AND PLAN:  Paroxysmal Afib - Pt called EMS for weakness and was admitted with hypotension and Afib RVR. Pt given IVF and spontaneously converted to SR PTA. No chest pain, troponin up to 141.  - Echo showed LVEF 60-65%, mild LVH, G2DD, mild MR.  - continue Eliquis - in NSR - continue diltiazem  Mildly elevated troponin/[place] ischemia - Hs trop up to 141 - in th setting of Afib RVR - No known CAD hx, No chest pain - Echo with normal EF - Myoview in 2017 showed small fixed, mild apical  perfusion defect with normal wall motion that was likely artifact, no definite ischemic or infarct, nor al EF, low risk - Will order Myoview stress test for risk stratification  Hypotension - in the setting of Afib RVR, and poor oral intake since husband recently passed. Resolved with IVF - HCTZ held on admission - continue diltiazem   Disposition:   FU with ***   Signed, Demetra Moya Ninfa Meeker, PA-C 03/26/2020 9:45 PM    _____________ [facility] [address] [place] [address]  [phone] (office) [phone] (fax)

## 2020-03-27 ENCOUNTER — Ambulatory Visit: Payer: Medicare Other | Admitting: Cardiology

## 2020-04-04 ENCOUNTER — Ambulatory Visit (INDEPENDENT_AMBULATORY_CARE_PROVIDER_SITE_OTHER): Payer: Medicare Other | Admitting: Family Medicine

## 2020-04-04 ENCOUNTER — Other Ambulatory Visit: Payer: Self-pay

## 2020-04-04 VITALS — BP 150/80 | HR 77 | Temp 98.0°F | Ht 67.0 in | Wt 105.0 lb

## 2020-04-04 DIAGNOSIS — R634 Abnormal weight loss: Secondary | ICD-10-CM

## 2020-04-04 MED ORDER — MIRTAZAPINE 30 MG PO TABS: 30.0000 mg | ORAL_TABLET | Freq: Every day | ORAL | 2 refills | Status: DC

## 2020-04-04 MED ORDER — PREDNISONE 10 MG PO TABS: 10.0000 mg | ORAL_TABLET | Freq: Every day | ORAL | 0 refills | Status: DC

## 2020-04-04 NOTE — Progress Notes (Signed)
Subjective:    Patient ID: Diane Patterson, female    DOB: 1945/12/05, 74 y.o.   MRN: [id number]  HPI 03/14/20 Patient was recently admitted to the hospital September 21 for chest pain shortness of breath and tachycardia.  She was found to be in atrial fibrillation with rapid ventricular response.  There was also thought that she could be dehydrated.  She received IV fluid and the patient spontaneously converted back to normal sinus rhythm.  She did have elevated troponin levels while in the hospital and was thought to have ischemic strain secondary to rapid ventricular response.  She has an appointment to see her cardiologist next week.  Today she is on Cardizem 240 mg daily.  She is back in normal sinus rhythm and her heart rate is well controlled at 70 beats a minute on my exam.  She denies any chest pain.  She denies any new shortness of breath beyond her baseline COPD that requires 2 L of oxygen daily.  She did discontinue hydrochlorothiazide as recommended by the hospital.  Her blood pressure remains well controlled today.  However she is still taking her potassium supplement although she is no longer on her diuretic.  She is also due for a flu shot.  At that time, my plan was: Patient's blood pressure is still well controlled despite holding hydrochlorothiazide.  I recommended that she discontinue potassium as she is no longer on a diuretic.  Recheck potassium today with a CMP.  Patient is currently in normal sinus rhythm.  Her rate is appropriately controlled with diltiazem.  She denies any palpitations or syncope or near syncope.  She denies any shortness of breath.  Follow-up as planned with cardiology.  Patient received her flu shot today.  I plan to see the patient back in 1 month for recheck and at that time I will recheck her potassium off the supplement.  At that point we discussed preventative care including bone density test.  Given her frail status, I do not feel that she is  appropriate for cancer screening such as a colonoscopy or mammogram but I will discuss that more with her at that point.  We will also be due to recheck her A1c.  Her last A1c in August was outstanding at 6.1.  04/04/20 Wt Readings from Last 3 Encounters:  04/04/20 105 lb (47.6 kg)  03/14/20 111 lb (50.3 kg)  01/29/20 115 lb (52.2 kg)   Patient states that over the last 3 weeks, she has been having hot flashes. She states that suddenly at night she will feel like she is burning up. She will have to kick off the covers and she will have night sweats. She also has sudden hot flashes during the daytime for no reason. She equates it to hot flashes with menopause however she never has experienced them before. They have only been occurring over the last 3 weeks. Also she is losing weight. Her weight has dropped from 115 in August to 105 today. Her appetite has been diminished. During this exact same time she lost her husband. She has been under stress dealing with the finances of the estate and grieving his loss. Therefore some of this could be related to stress and anxiety. However given her age and her history of tobacco abuse infections and malignancy are also a concern. Past Medical History:  Diagnosis Date  . Asthma   . Chronic airflow obstruction (HCC)   . Dysrhythmia   . Hypertension   . Tobacco  abuse   . Venous insufficiency    Past Surgical History:  Procedure Laterality Date  . cataract    . NECK SURGERY    . no colonoscopy     "afraid to "; [facility] reviewed  . VESICOVAGINAL FISTULA CLOSURE W/ TAH     Current Outpatient Medications on File Prior to Visit  Medication Sig Dispense Refill  . acetaminophen (TYLENOL) 325 MG tablet Take 650 mg by mouth every 6 (six) hours as needed for mild pain.     Marland Kitchen albuterol (VENTOLIN HFA) 108 (90 Base) MCG/ACT inhaler Inhale 2 puffs into the lungs every 4 (four) hours as needed for wheezing or shortness of breath. 18 g 3  . apixaban (ELIQUIS) 5 MG TABS  tablet Take 1 tablet (5 mg total) by mouth 2 (two) times daily. 180 tablet 1  . budesonide-formoterol (SYMBICORT) 160-4.5 MCG/ACT inhaler INHALE 2 PUFFS INTO LUNGS TWICE DAILY (Patient taking differently: Inhale 2 puffs into the lungs in the morning and at bedtime. ) 10.2 Inhaler 5  . diltiazem (CARDIZEM CD) 240 MG 24 hr capsule TAKE 1 CAPSULE(240 MG) BY MOUTH DAILY (Patient taking differently: Take 240 mg by mouth daily. ) 90 capsule 1  . potassium chloride SA (KLOR-CON M20) 20 MEQ tablet Take 1 tablet (20 mEq total) by mouth daily. 90 tablet 3  . Respiratory Therapy Supplies (FLUTTER) DEVI Use as directed. 1 each 0   No current facility-administered medications on file prior to visit.   No Known Allergies Social History   Socioeconomic History  . Marital status: Married    Spouse name: Not on file  . Number of children: 1  . Years of education: Not on file  . Highest education level: Not on file  Occupational History  . Not on file  Tobacco Use  . Smoking status: Former Smoker    Packs/day: 0.75    Years: 15.00    Pack years: 11.25    Types: Cigarettes    Quit date: 03/20/2014    Years since quitting: 6.0  . Smokeless tobacco: Former Systems developer    Quit date: 03/11/2014  Vaping Use  . Vaping Use: Never used  Substance and Sexual Activity  . Alcohol use: No    Alcohol/week: 0.0 standard drinks  . Drug use: No  . Sexual activity: Not on file  Other Topics Concern  . Not on file  Social History Narrative  . Not on file   Social Determinants of Health   Financial Resource Strain:   . Difficulty of Paying Living Expenses: Not on file  Food Insecurity:   . Worried About Charity fundraiser in the Last Year: Not on file  . Ran Out of Food in the Last Year: Not on file  Transportation Needs:   . Lack of Transportation (Medical): Not on file  . Lack of Transportation (Non-Medical): Not on file  Physical Activity:   . Days of Exercise per Week: Not on file  . Minutes of Exercise  per Session: Not on file  Stress:   . Feeling of Stress : Not on file  Social Connections:   . Frequency of Communication with Friends and Family: Not on file  . Frequency of Social Gatherings with Friends and Family: Not on file  . Attends Religious Services: Not on file  . Active Member of Clubs or Organizations: Not on file  . Attends Archivist Meetings: Not on file  . Marital Status: Not on file  Intimate Partner Violence:   .  Fear of Current or Ex-Partner: Not on file  . Emotionally Abused: Not on file  . Physically Abused: Not on file  . Sexually Abused: Not on file     Review of Systems  All other systems reviewed and are negative.      Objective:   Physical Exam Constitutional:      General: She is not in acute distress.    Appearance: Normal appearance. She is not toxic-appearing or diaphoretic.  Cardiovascular:     Rate and Rhythm: Normal rate and regular rhythm.     Heart sounds: Normal heart sounds. No murmur heard.  No friction rub. No gallop.   Pulmonary:     Effort: Pulmonary effort is normal. No respiratory distress.     Breath sounds: Decreased air movement present. No wheezing or rales.  Musculoskeletal:     Right lower leg: No edema.     Left lower leg: No edema.  Neurological:     Mental Status: She is alert.           Assessment & Plan:  Weight loss - Plan: CBC with Differential/Platelet, COMPLETE METABOLIC PANEL WITH GFR, TSH, Fecal Globin By Immunochemistry  I am very concerned about the weight loss and the hot flashes and the night sweats. Some of this can certainly be due to stress and anxiety related to the death of her husband and handling the closing of the estate. I will start Remeron 30 mg p.o. nightly as an appetite stimulant and hopefully help her sleep and relax better. However I believe we need to undertake a work-up starting with a stool test for blood, CBC, CMP, and TSH. If lab work and stool test are normal, consider a  work-up for malignancy including a mammogram, and a chest x-ray. Meanwhile start the patient on Remeron 30 mg p.o. nightly as I have stated earlier as an appetite stimulant. Reassess the patient in 3 to 4 weeks.

## 2020-04-05 LAB — COMPLETE METABOLIC PANEL WITH GFR
AG Ratio: 1.5 (calc) (ref 1.0–2.5)
ALT: 11 U/L (ref 6–29)
AST: 15 U/L (ref 10–35)
Albumin: 4 g/dL (ref 3.6–5.1)
Alkaline phosphatase (APISO): 79 U/L (ref 37–153)
BUN: 19 mg/dL (ref 7–25)
CO2: 26 mmol/L (ref 20–32)
Calcium: 9.6 mg/dL (ref 8.6–10.4)
Chloride: 99 mmol/L (ref 98–110)
Creat: 0.86 mg/dL (ref 0.60–0.93)
GFR, Est African American: 77 mL/min/{1.73_m2} (ref >=60)
GFR, Est Non African American: 66 mL/min/{1.73_m2} (ref >=60)
Globulin: 2.7 g/dL (calc) (ref 1.9–3.7)
Glucose, Bld: 147 mg/dL — ABNORMAL HIGH (ref 65–99)
Potassium: 4 mmol/L (ref 3.5–5.3)
Sodium: 139 mmol/L (ref 135–146)
Total Bilirubin: 0.4 mg/dL (ref 0.2–1.2)
Total Protein: 6.7 g/dL (ref 6.1–8.1)

## 2020-04-05 LAB — CBC WITH DIFFERENTIAL/PLATELET
Absolute Monocytes: 364 cells/uL (ref 200–950)
Basophils Absolute: 43 cells/uL (ref 0–200)
Basophils Relative: 0.4 %
Eosinophils Absolute: 32 cells/uL (ref 15–500)
Eosinophils Relative: 0.3 %
HCT: 38 % (ref 35.0–45.0)
Hemoglobin: 12.4 g/dL (ref 11.7–15.5)
Lymphs Abs: 899 cells/uL (ref 850–3900)
MCH: 29 pg (ref 27.0–33.0)
MCHC: 32.6 g/dL (ref 32.0–36.0)
MCV: 89 fL (ref 80.0–100.0)
MPV: 11.6 fL (ref 7.5–12.5)
Monocytes Relative: 3.4 %
Neutro Abs: 9363 cells/uL — ABNORMAL HIGH (ref 1500–7800)
Neutrophils Relative %: 87.5 %
Platelets: 358 10*3/uL (ref 140–400)
RBC: 4.27 10*6/uL (ref 3.80–5.10)
RDW: 12.6 % (ref 11.0–15.0)
Total Lymphocyte: 8.4 %
WBC: 10.7 10*3/uL (ref 3.8–10.8)

## 2020-04-05 LAB — TSH: TSH: 1.3 mIU/L (ref 0.40–4.50)

## 2020-04-08 ENCOUNTER — Telehealth: Payer: Self-pay

## 2020-04-08 NOTE — Telephone Encounter (Signed)
Diane Patterson stated that the Remeron is too strong for her, she was dizzy just about all day yesterday 04-07-20 after taking it. She would like to know if she can cut the pills in half?

## 2020-04-09 ENCOUNTER — Encounter: Payer: Self-pay | Admitting: Physician Assistant

## 2020-04-09 NOTE — Progress Notes (Addendum)
Cardiology Office Note    Date:  04/11/2020   ID:  Diane Patterson, Diane Patterson August 28, 1945, MRN [id number]  PCP:  Diane Frizzle, MD  Cardiologist:  Diane Furbish, MD  Electrophysiologist:  None   Chief Complaint: post-hospital follow-up of atrial fib  History of Present Illness:   Diane Patterson is a 74 y.o. female with history of paroxysmal atrial fibrillation, COPD with chronic respiratory failure on 2 L of home O2, hypertension, former tobacco abuse, asthma, venous insufficiency who presents for post hospital follow-up.  She was recently admitted with weakness and hypotension with A. fib RVR.  She had been living alone since her husband recently passed away and had not been eating and drinking as much.  Per EMS she was found to be hypotensive in the 80s with heart rates in the 160s.  She was bolused with IV fluids and converted to sinus bradycardia in the 60s in route to the hospital.  She was admitted for further observation and continued on home Cardizem and apixaban.  Her elevated troponin was felt secondary to demand ischemia.  Her HCTZ was discontinued (and eventually her potassium too).  2D echo 03/04/20 showed EF 60 to 65%, mild concentric left ventricular hypertrophy, grade 2 diastolic dysfunction, mild mitral regurgitation.  It was felt that an outpatient stress test should be considered.  She has seen back for follow-up with her niece Diane Patterson today who moved from [place] to live with her.  In general she is stable from a cardiac standpoint without any recent palpitations, chest pain, or accelerating shortness of breath.  She has dyspnea at baseline related to her COPD.  She continues to follow with Dr. Dennard Patterson closely for her weight loss (previously 130s, now 106lb).  She reports this began a few months before her husband died but really picked up after he passed away.  They were married for 52 years, 2 months, and 4 days.  She is grateful to have family nearby to help.  She has  noticed her blood pressure has been running higher off of the HCTZ and wonders if it is time to restart.  Labwork independently reviewed: 03/2020 TSH wnl, CMET wnl, CBC ok except neutrophils up 02/2020 LDL 87  Past Medical History:  Diagnosis Date  . Asthma   . Chronic airflow obstruction (HCC)   . Hypertension   . Hypotension   . PAF (paroxysmal atrial fibrillation) ([place])   . Tobacco abuse   . Venous insufficiency     Past Surgical History:  Procedure Laterality Date  . cataract    . NECK SURGERY    . no colonoscopy     "afraid to "; [facility] reviewed  . VESICOVAGINAL FISTULA CLOSURE W/ TAH      Current Medications: Current Meds  Medication Sig  . albuterol (VENTOLIN HFA) 108 (90 Base) MCG/ACT inhaler Inhale 2 puffs into the lungs every 4 (four) hours as needed for wheezing or shortness of breath.  Marland Kitchen apixaban (ELIQUIS) 5 MG TABS tablet Take 1 tablet (5 mg total) by mouth 2 (two) times daily.  . budesonide-formoterol (SYMBICORT) 160-4.5 MCG/ACT inhaler INHALE 2 PUFFS INTO LUNGS TWICE DAILY  . diltiazem (CARDIZEM CD) 240 MG 24 hr capsule TAKE 1 CAPSULE(240 MG) BY MOUTH DAILY  . predniSONE (DELTASONE) 10 MG tablet Take 1 tablet (10 mg total) by mouth daily with breakfast.      Allergies:   Patient has no known allergies.   Social History   Socioeconomic History  . Marital  status: Married    Spouse name: Not on file  . Number of children: 1  . Years of education: Not on file  . Highest education level: Not on file  Occupational History  . Not on file  Tobacco Use  . Smoking status: Former Smoker    Packs/day: 0.75    Years: 15.00    Pack years: 11.25    Types: Cigarettes    Quit date: 03/20/2014    Years since quitting: 6.0  . Smokeless tobacco: Former Systems developer    Quit date: 03/11/2014  Vaping Use  . Vaping Use: Never used  Substance and Sexual Activity  . Alcohol use: No    Alcohol/week: 0.0 standard drinks  . Drug use: No  . Sexual activity: Not on file  Other  Topics Concern  . Not on file  Social History Narrative  . Not on file   Social Determinants of Health   Financial Resource Strain:   . Difficulty of Paying Living Expenses: Not on file  Food Insecurity:   . Worried About Charity fundraiser in the Last Year: Not on file  . Ran Out of Food in the Last Year: Not on file  Transportation Needs:   . Lack of Transportation (Medical): Not on file  . Lack of Transportation (Non-Medical): Not on file  Physical Activity:   . Days of Exercise per Week: Not on file  . Minutes of Exercise per Session: Not on file  Stress:   . Feeling of Stress : Not on file  Social Connections:   . Frequency of Communication with Friends and Family: Not on file  . Frequency of Social Gatherings with Friends and Family: Not on file  . Attends Religious Services: Not on file  . Active Member of Clubs or Organizations: Not on file  . Attends Archivist Meetings: Not on file  . Marital Status: Not on file     Family History:  The patient's family history includes Asthma in her father; Diabetes in her mother; Emphysema in her father; Heart disease in her father.  ROS:   Please see the history of present illness.  All other systems are reviewed and otherwise negative.    EKGs/Labs/Other Studies Reviewed:    Studies reviewed are outlined and summarized above. Reports included below if pertinent.  IMPRESSIONS 2D echo 02/2020   1. Left ventricular ejection fraction, by estimation, is 60 to 65%. The  left ventricle has normal function. The left ventricle has no regional  wall motion abnormalities. There is mild concentric left ventricular  hypertrophy. Left ventricular diastolic  parameters are consistent with Grade II diastolic dysfunction  (pseudonormalization). Elevated left atrial pressure.  2. Right ventricular systolic function is normal. The right ventricular  size is normal.  3. The mitral valve is normal in structure. Mild mitral  valve  regurgitation. No evidence of mitral stenosis.  4. The aortic valve is normal in structure. Aortic valve regurgitation is  not visualized. No aortic stenosis is present.  5. The inferior vena cava is dilated in size with >50% respiratory  variability, suggesting right atrial pressure of 8 mmHg.   Comparison(s): No significant change from prior study. Prior images  reviewed side by side.     EKG:  EKG is ordered today, personally reviewed, demonstrating NSR 63bpm, LAD, LAFB, no acute changes.  Recent Labs: 03/03/2020: B Natriuretic Peptide 74.3 04/04/2020: ALT 11; BUN 19; Creat 0.86; Hemoglobin 12.4; Platelets 358; Potassium 4.0; Sodium 139; TSH 1.30  Recent Lipid Panel    Component Value Date/Time   CHOL 164 03/03/2020 2313   TRIG 93 03/03/2020 2313   HDL 58 03/03/2020 2313   CHOLHDL 2.8 03/03/2020 2313   VLDL 19 03/03/2020 2313   LDLCALC 87 03/03/2020 2313   LDLCALC 88 03/05/2019 0904   LDLDIRECT 136.0 02/10/2010 1017    PHYSICAL EXAM:    VS:  BP (!) 158/70   Pulse 63   Ht _0  (1.702 m)   Wt 106 lb 9.6 oz (48.4 kg)   LMP  (LMP Unknown)   SpO2 98%   BMI 16.70 kg/m   BMI: Body mass index is 16.7 kg/m.  GEN:  Thin frail appearing WF, in no acute distress HEENT: normocephalic, atraumatic, nasal cannula O2 in place Neck: no JVD, carotid bruits, or masses Cardiac: RRR; no murmurs, rubs, or gallops, no edema  Respiratory:  Diffusely diminished bilaterally, normal work of breathing GI: soft, nontender, nondistended, + BS MS: no deformity or atrophy Skin: warm and dry, no rash Neuro:  Alert and Oriented x 3, Strength and sensation are intact, follows commands Psych: euthymic mood, full affect  Wt Readings from Last 3 Encounters:  04/11/20 106 lb 9.6 oz (48.4 kg)  04/04/20 105 lb (47.6 kg)  03/14/20 111 lb (50.3 kg)     ASSESSMENT & PLAN:   1. Paroxysmal atrial fibrillation - currently maintaining normal sinus rhythm.  Recent thyroid function was normal.   Continue Eliquis and diltiazem. CHADSVASC 4 for HTN, age x2, female. 2. Elevated troponin - troponin peak was 154, which seems consistent with demand ischemia in the process above with profound hypotension and tachycardia.  Her LV function was normal by echocardiogram.  Outpatient stress testing was suggested in the hospital.  However given her general frailty, ongoing weight loss, severe COPD, and lack of angina, I am not sure she is a great candidate to proceed with Lexiscan.  She has not had any angina recently and has not had any progressive dyspnea from her bsaeline.  She does not really wish to pursue this.  Per shared decision making, we will hold off on stress testing at this time but can consider if progressive symptoms arise. 3. Essential HTN more recently with hypotension - blood pressure has been creeping back up.  We will restart her home hydrochlorothiazide and potassium today.  Would recommend she get a be met in about a week.  She would prefer at this be through her primary care so we will have nurse call Dr. Samella Parr office to help coordinate.  4. COPD - at baseline today, on home O2.  She is also on chronic prednisone therapy.  She is requesting a refill of her albuterol inhaler as she is out completely and wishes to pick it up today with her blood pressure medicine.  I will go ahead and send in the refill but did encourage her to touch base with primary care about further refills.  Disposition: F/u with Dr. Marlou Porch in 6 months. Will also route this note to him and FYI re: stress testing.   Medication Adjustments/Labs and Tests Ordered: Current medicines are reviewed at length with the patient today.  Concerns regarding medicines are outlined above. Medication changes, Labs and Tests ordered today are summarized above and listed in the Patient Instructions accessible in Encounters.   Signed, Charlie Pitter, PA-C  04/11/2020 9:52 AM    [address], [place], [place]  [address] Phone: [phone];  Fax: [phone]  

## 2020-04-10 NOTE — Telephone Encounter (Signed)
Absolutely, try 15 mg poqhs

## 2020-04-11 ENCOUNTER — Other Ambulatory Visit: Payer: Self-pay | Admitting: *Deleted

## 2020-04-11 ENCOUNTER — Encounter: Payer: Self-pay | Admitting: Physician Assistant

## 2020-04-11 ENCOUNTER — Ambulatory Visit: Payer: Medicare Other | Admitting: Physician Assistant

## 2020-04-11 ENCOUNTER — Other Ambulatory Visit: Payer: Self-pay

## 2020-04-11 VITALS — BP 158/70 | HR 63 | Ht 67.0 in | Wt 106.6 lb

## 2020-04-11 DIAGNOSIS — J449 Chronic obstructive pulmonary disease, unspecified: Secondary | ICD-10-CM

## 2020-04-11 DIAGNOSIS — R7989 Other specified abnormal findings of blood chemistry: Secondary | ICD-10-CM

## 2020-04-11 DIAGNOSIS — Z720 Tobacco use: Secondary | ICD-10-CM

## 2020-04-11 DIAGNOSIS — J441 Chronic obstructive pulmonary disease with (acute) exacerbation: Secondary | ICD-10-CM

## 2020-04-11 DIAGNOSIS — I10 Essential (primary) hypertension: Secondary | ICD-10-CM

## 2020-04-11 DIAGNOSIS — R778 Other specified abnormalities of plasma proteins: Secondary | ICD-10-CM

## 2020-04-11 DIAGNOSIS — I480 Paroxysmal atrial fibrillation: Secondary | ICD-10-CM | POA: Diagnosis not present

## 2020-04-11 MED ORDER — POTASSIUM CHLORIDE CRYS ER 20 MEQ PO TBCR: 20.0000 meq | EXTENDED_RELEASE_TABLET | Freq: Every day | ORAL | 3 refills | Status: DC

## 2020-04-11 MED ORDER — HYDROCHLOROTHIAZIDE 12.5 MG PO CAPS: 12.5000 mg | ORAL_CAPSULE | Freq: Every day | ORAL | 3 refills | Status: AC

## 2020-04-11 MED ORDER — ALBUTEROL SULFATE HFA 108 (90 BASE) MCG/ACT IN AERS: 2.0000 | INHALATION_SPRAY | RESPIRATORY_TRACT | 3 refills | Status: DC | PRN

## 2020-04-11 NOTE — Patient Instructions (Addendum)
Medication Instructions:  Your physician has recommended you make the following change in your medication:  1.  START Hydrochlorothiazide 12.5 mg taking 1 tablet daily 2.  START Potassium 20 meq taking 1 daily   *If you need a refill on your cardiac medications before your next appointment, please call your pharmacy*   Lab Work: 04/21/20 8:15  WEEKS AT PCP'S OFFICE:  BMET  If you have labs (blood work) drawn today and your tests are completely normal, you will receive your results only by: Marland Kitchen MyChart Message (if you have MyChart) OR . A paper copy in the mail If you have any lab test that is abnormal or we need to change your treatment, we will call you to review the results.   Testing/Procedures: None ordered   Follow-Up: At [facility], you and your health needs are our priority.  As part of our continuing mission to provide you with exceptional heart care, we have created designated Provider Care Teams.  These Care Teams include your primary Cardiologist (physician) and Advanced Practice Providers (APPs -  Physician Assistants and Nurse Practitioners) who all work together to provide you with the care you need, when you need it.  We recommend signing up for the patient portal called "MyChart".  Sign up information is provided on this After Visit Summary.  MyChart is used to connect with patients for Virtual Visits (Telemedicine).  Patients are able to view lab/test results, encounter notes, upcoming appointments, etc.  Non-urgent messages can be sent to your provider as well.   To learn more about what you can do with MyChart, go to [email or web address].    Your next appointment:   6 month(s)  The format for your next appointment:   In Person  Provider:   You may see Candee Furbish, MD or one of the following Advanced Practice Providers on your designated Care Team:    Truitt Merle, NP  Cecilie Kicks, NP  Kathyrn Drown, NP    Other Instructions   Please monitor your  blood pressure occasionally at home. Call your doctor if you tend to get readings of greater than 135-140 on the top number.

## 2020-04-14 ENCOUNTER — Other Ambulatory Visit: Payer: Self-pay | Admitting: Cardiology

## 2020-04-14 NOTE — Telephone Encounter (Signed)
LVM for pt regarding Dr. Dennard Schaumann instructions to the Remeron

## 2020-04-21 ENCOUNTER — Ambulatory Visit: Payer: Self-pay

## 2020-04-22 ENCOUNTER — Ambulatory Visit: Payer: Medicare Other | Admitting: Cardiology

## 2020-05-06 ENCOUNTER — Telehealth: Payer: Self-pay

## 2020-05-06 NOTE — Telephone Encounter (Signed)
Patient with COPD, asking if zpak could be sent in, has cough cant cough up mucus.  [facility]

## 2020-05-06 NOTE — Telephone Encounter (Signed)
Call placed to patient and patient made aware.   Appointment scheduled.  

## 2020-05-06 NOTE — Telephone Encounter (Signed)
Pt needs to be seen, put her in for tomorrow

## 2020-05-07 ENCOUNTER — Ambulatory Visit: Payer: Self-pay | Admitting: Family Medicine

## 2020-05-14 ENCOUNTER — Ambulatory Visit (INDEPENDENT_AMBULATORY_CARE_PROVIDER_SITE_OTHER): Payer: Medicare Other | Admitting: Family Medicine

## 2020-05-14 ENCOUNTER — Other Ambulatory Visit: Payer: Self-pay

## 2020-05-14 ENCOUNTER — Encounter: Payer: Self-pay | Admitting: Family Medicine

## 2020-05-14 VITALS — BP 148/74 | HR 90 | Temp 98.3°F | Resp 16 | Ht 67.0 in | Wt 102.4 lb

## 2020-05-14 DIAGNOSIS — R040 Epistaxis: Secondary | ICD-10-CM | POA: Diagnosis not present

## 2020-05-14 DIAGNOSIS — Z20822 Contact with and (suspected) exposure to covid-19: Secondary | ICD-10-CM | POA: Diagnosis not present

## 2020-05-14 DIAGNOSIS — J449 Chronic obstructive pulmonary disease, unspecified: Secondary | ICD-10-CM | POA: Diagnosis not present

## 2020-05-14 DIAGNOSIS — J441 Chronic obstructive pulmonary disease with (acute) exacerbation: Secondary | ICD-10-CM | POA: Diagnosis not present

## 2020-05-14 MED ORDER — GUAIFENESIN-DM 100-10 MG/5ML PO SYRP: 5.0000 mL | ORAL_SOLUTION | ORAL | 0 refills | Status: DC | PRN

## 2020-05-14 MED ORDER — BUDESONIDE-FORMOTEROL FUMARATE 160-4.5 MCG/ACT IN AERO: INHALATION_SPRAY | RESPIRATORY_TRACT | 3 refills | Status: DC

## 2020-05-14 MED ORDER — AZITHROMYCIN 250 MG PO TABS: ORAL_TABLET | ORAL | 0 refills | Status: DC

## 2020-05-14 MED ORDER — ALBUTEROL SULFATE HFA 108 (90 BASE) MCG/ACT IN AERS: 2.0000 | INHALATION_SPRAY | RESPIRATORY_TRACT | 3 refills | Status: DC | PRN

## 2020-05-14 MED ORDER — PREDNISONE 10 MG PO TABS: ORAL_TABLET | ORAL | 0 refills | Status: DC

## 2020-05-14 NOTE — Assessment & Plan Note (Signed)
COPD exacerbation but concern for COVID-19 infection with positive close contact, swab done, start prednisone taper, zpal, tessalon, continue inhalers No red flags on exam, sat good with oxygen She has family member caring for her

## 2020-05-14 NOTE — Progress Notes (Signed)
.     Subjective:    Patient ID: Diane Patterson, female    DOB: 1945/09/24, 74 y.o.   MRN: [id number]  Patient presents for Illness (x1 week- productive cough, SOB, fever/chills, nasal congestion, chest congestion, HA- + exposure on Friday)   Pt here with acute illness started Monday morning , she feel tight in chest with cough, cant get up the sputum. SHe feels bad in general , face feels numb at times  Over sinus region, , she is eating some, she has chills has bodyaches  she has nausea but no vomiting, no diarrhea  She was exposed to COVID-19 on Friday  She has had covid vaccines and flu shot  Her Niece lives with her  Her oxygen has been 96% on 2L She has been symbicort and albuterol She has not used anything over the counter   Review Of Systems:  GEN- denies fatigue, fever, weight loss,weakness, recent illness HEENT- denies eye drainage, change in vision, nasal discharge, CVS- denies chest pain, palpitations RESP- +SOB, +cough, +wheeze ABD- denies N/V, change in stools, abd pain GU- denies dysuria, hematuria, dribbling, incontinence MSK- denies joint pain, muscle aches, injury Neuro- denies headache, dizziness, syncope, seizure activity       Objective:    BP (!) 148/74   Pulse 90   Temp 98.3 F (36.8 C) (Temporal)   Resp 16   Ht 5\' 7"  (1.702 m)   Wt 102 lb 6.4 oz (46.4 kg)   LMP  (LMP Unknown)   SpO2 96% Comment: 2L/min via [place]  BMI 16.04 kg/m  GEN- NAD, alert and oriented x3 HEENT- PERRL, EOMI, non injected sclera, pink conjunctiva, MMM, oropharynx clear, nares mild blood in left canal, no sinus tenderness Neck- Supple, no LAD CVS- RRR, no murmur RESP-bilat scattered wheeze, normal WOB at rest, no retractions, sat normal 2L ABD-NABS,soft,NT,ND EXT- No edema Pulses- Radial, DP- 2+  Pt states nose was bleeding earlier ,after covid swab, she had mild bleeding rom left nares, Afrin applied, pt held pressure and it resolved      Assessment & Plan:       Problem List Items Addressed This Visit      Unprioritized   COPD (chronic obstructive pulmonary disease) (HCC)   Relevant Medications   albuterol (VENTOLIN HFA) 108 (90 Base) MCG/ACT inhaler   budesonide-formoterol (SYMBICORT) 160-4.5 MCG/ACT inhaler   predniSONE (DELTASONE) 10 MG tablet   azithromycin (ZITHROMAX) 250 MG tablet   guaiFENesin-dextromethorphan (ROBITUSSIN DM) 100-10 MG/5ML syrup   COPD exacerbation (HCC) - Primary    COPD exacerbation but concern for COVID-19 infection with positive close contact, swab done, start prednisone taper, zpal, tessalon, continue inhalers No red flags on exam, sat good with oxygen She has family member caring for her      Relevant Medications   albuterol (VENTOLIN HFA) 108 (90 Base) MCG/ACT inhaler   budesonide-formoterol (SYMBICORT) 160-4.5 MCG/ACT inhaler   predniSONE (DELTASONE) 10 MG tablet   azithromycin (ZITHROMAX) 250 MG tablet   guaiFENesin-dextromethorphan (ROBITUSSIN DM) 100-10 MG/5ML syrup   Other Relevant Orders   SARS-COV-2 RNA,(COVID-19) QUAL NAAT    Other Visit Diagnoses    Close exposure to COVID-19 virus       Bleeding nose       Resolved with afrin       Note: This dictation was prepared with Dragon dictation along with smaller phrase technology. Any transcriptional errors that result from this process are unintentional.

## 2020-05-14 NOTE — Patient Instructions (Signed)
Take antibiotics, prednisone , cough medicine Use Afrin for nose bleed  Nasal saline for moisture in nose.[email or web address]  F/U as needed

## 2020-05-15 LAB — SARS-COV-2 RNA,(COVID-19) QUALITATIVE NAAT: SARS CoV2 RNA: NOT DETECTED

## 2020-05-27 ENCOUNTER — Ambulatory Visit (INDEPENDENT_AMBULATORY_CARE_PROVIDER_SITE_OTHER): Payer: Medicare Other | Admitting: Nurse Practitioner

## 2020-05-27 ENCOUNTER — Telehealth: Payer: Self-pay | Admitting: *Deleted

## 2020-05-27 ENCOUNTER — Other Ambulatory Visit: Payer: Self-pay

## 2020-05-27 ENCOUNTER — Encounter: Payer: Self-pay | Admitting: Nurse Practitioner

## 2020-05-27 VITALS — BP 132/74 | HR 77 | Temp 97.3°F | Ht 67.0 in | Wt 106.0 lb

## 2020-05-27 DIAGNOSIS — R059 Cough, unspecified: Secondary | ICD-10-CM

## 2020-05-27 DIAGNOSIS — R2681 Unsteadiness on feet: Secondary | ICD-10-CM | POA: Insufficient documentation

## 2020-05-27 DIAGNOSIS — R0989 Other specified symptoms and signs involving the circulatory and respiratory systems: Secondary | ICD-10-CM

## 2020-05-27 DIAGNOSIS — J441 Chronic obstructive pulmonary disease with (acute) exacerbation: Secondary | ICD-10-CM | POA: Diagnosis not present

## 2020-05-27 MED ORDER — GUAIFENESIN 100 MG/5ML PO SOLN: 5.0000 mL | ORAL | 0 refills | Status: DC | PRN

## 2020-05-27 MED ORDER — ALBUTEROL SULFATE (2.5 MG/3ML) 0.083% IN NEBU: 2.5000 mg | INHALATION_SOLUTION | Freq: Four times a day (QID) | RESPIRATORY_TRACT | 1 refills | Status: DC | PRN

## 2020-05-27 MED ORDER — LEVOFLOXACIN 500 MG PO TABS: 500.0000 mg | ORAL_TABLET | Freq: Every day | ORAL | 0 refills | Status: DC

## 2020-05-27 NOTE — Progress Notes (Signed)
Subjective:    Patient ID: Diane Patterson, female    DOB: 1945-09-19, 74 y.o.   MRN: [id number]  HPI: Diane Patterson is a 74 y.o. female presenting for cough.  Chief Complaint  Patient presents with   Cough    Feels dust has got into in her o2 lines, causing discomfort and productive cough   UPPER RESPIRATORY TRACT INFECTION  COVID swab 05/14/2020 was negative. Onset: ~2 days ago; reports similar congestion about 2 weeks ago and took full course of antibiotic and prednisone. Worst symptom: congestion Fever: no Cough: yes; productive cotton with white Shortness of breath: yes Wheezing: yes; has been using inhaler twice per day and this helps for a little Chest pain: no Chest tightness: yes Chest congestion: yes Nasal congestion: no Runny nose: no Post nasal drip: no Sneezing: yes Sore throat: no Swollen glands: no Sinus pressure: no Headache: no Face pain: no Toothache: no Ear pain: no  Ear pressure: no  Eyes red/itching:no Eye drainage/crusting: no  Nausea: no Vomiting: no Diarrhea: no Change in appetite: has  Not been eating well since husband passed 3 months ago; has son who lives nearby and daughter in [employer].  Loss of taste/smell: no Rash: no Fatigue: yes Sick contacts: no Strep contacts: no  Context: stable Recurrent sinusitis: no Treatments attempted: albuterol rescue inhaler Relief with OTC medications: no  No Known Allergies  Outpatient Encounter Medications as of 05/27/2020  Medication Sig   albuterol (VENTOLIN HFA) 108 (90 Base) MCG/ACT inhaler Inhale 2 puffs into the lungs every 4 (four) hours as needed for wheezing or shortness of breath.   apixaban (ELIQUIS) 5 MG TABS tablet Take 1 tablet (5 mg total) by mouth 2 (two) times daily.   azithromycin (ZITHROMAX) 250 MG tablet Take 2 tablets x 1 day, then 1 tablet daily x 4 days   budesonide-formoterol (SYMBICORT) 160-4.5 MCG/ACT inhaler INHALE 2 PUFFS INTO LUNGS TWICE DAILY    diltiazem (CARDIZEM CD) 240 MG 24 hr capsule TAKE 1 CAPSULE BY MOUTH EVERY DAY   hydrochlorothiazide (MICROZIDE) 12.5 MG capsule Take 1 capsule (12.5 mg total) by mouth daily.   potassium chloride SA (KLOR-CON) 20 MEQ tablet Take 1 tablet (20 mEq total) by mouth daily.   predniSONE (DELTASONE) 10 MG tablet Take 40mg  x 3 days,20mg  x 3 days, 10mg  x 3 days   [DISCONTINUED] guaiFENesin-dextromethorphan (ROBITUSSIN DM) 100-10 MG/5ML syrup Take 5 mLs by mouth every 4 (four) hours as needed for cough.   albuterol (PROVENTIL) (2.5 MG/3ML) 0.083% nebulizer solution Take 3 mLs (2.5 mg total) by nebulization every 6 (six) hours as needed for wheezing or shortness of breath.   guaiFENesin (ROBITUSSIN) 100 MG/5ML SOLN Take 5 mLs (100 mg total) by mouth every 4 (four) hours as needed for cough or to loosen phlegm.   levofloxacin (LEVAQUIN) 500 MG tablet Take 1 tablet (500 mg total) by mouth daily.   No facility-administered encounter medications on file as of 05/27/2020.    Patient Active Problem List   Diagnosis Date Noted   Gait instability 05/27/2020   Paroxysmal A-fib ([place]) 03/03/2020   Diabetes mellitus type 2, uncomplicated ([place]) 62/26/3335   Atrial fibrillation with RVR ([place])    AKI (acute kidney injury) ([place]) 07/11/2017   Chronic respiratory failure with hypoxia ([place]) 11/15/2016   Allergic rhinitis 10/14/2016   Contusion 12/01/2015   Abscess of umbilicus 45/62/5638   COPD exacerbation ([place]) 06/23/2015   Right carotid bruit 08/19/2014   Hyperlipidemia 08/19/2014   Prediabetes 08/19/2014  Bruising 09/26/2013   History of basal cell cancer 09/26/2013   Oral candidiasis 01/10/2013   Essential hypertension 12/21/2011   COPD (chronic obstructive pulmonary disease) ([place]) 01/20/2011   Constipation 06/24/2010   ABDOMINAL BLOATING 06/24/2010   ATHEROSCLEROSIS OF AORTA 02/10/2010   FATTY LIVER DISEASE 02/10/2010   Anxiety state 05/14/2008   Former smoker 12/28/2006    VENOUS INSUFFICIENCY 12/28/2006    Past Medical History:  Diagnosis Date   Asthma    Chronic airflow obstruction (HCC)    Hypertension    Hypotension    PAF (paroxysmal atrial fibrillation) (HCC)    Tobacco abuse    Venous insufficiency     Relevant past medical, surgical, family and social history reviewed and updated as indicated. Interim medical history since our last visit reviewed.  Review of Systems  Constitutional: Positive for fatigue. Negative for activity change, appetite change and fever.  HENT: Positive for congestion and sneezing. Negative for dental problem, drooling, ear discharge, ear pain, facial swelling, postnasal drip, rhinorrhea, sinus pressure, sinus pain, sore throat, trouble swallowing and voice change.   Eyes: Negative.  Negative for pain, discharge, redness and itching.  Respiratory: Positive for cough, chest tightness, shortness of breath and wheezing.   Cardiovascular: Negative.  Negative for chest pain and palpitations.  Gastrointestinal: Negative.  Negative for blood in stool, constipation, diarrhea and vomiting.  Musculoskeletal: Negative.   Skin: Negative.  Negative for rash.  Neurological: Negative.   Psychiatric/Behavioral: Negative.     Per HPI unless specifically indicated above     Objective:    BP 132/74    Pulse 77    Temp (!) 97.3 F (36.3 C)    Ht 5\' 7"  (1.702 m)    Wt 106 lb (48.1 kg)    LMP  (LMP Unknown)    SpO2 99%    BMI 16.60 kg/m   Wt Readings from Last 3 Encounters:  05/27/20 106 lb (48.1 kg)  05/14/20 102 lb 6.4 oz (46.4 kg)  04/11/20 106 lb 9.6 oz (48.4 kg)    Physical Exam Vitals and nursing note reviewed.  Constitutional:      General: She is not in acute distress.    Appearance: Normal appearance. She is not toxic-appearing.  HENT:     Head: Normocephalic and atraumatic.     Right Ear: External ear normal.     Left Ear: External ear normal.     Nose: Nose normal. No congestion.     Mouth/Throat:      Mouth: Mucous membranes are moist.     Pharynx: Oropharynx is clear.  Eyes:     General: No scleral icterus.    Extraocular Movements: Extraocular movements intact.  Cardiovascular:     Rate and Rhythm: Normal rate and regular rhythm.     Heart sounds: Normal heart sounds. No murmur heard.   Pulmonary:     Effort: Pulmonary effort is normal.     Breath sounds: Decreased air movement present. Wheezing and rhonchi present.     Comments: Pre SABA: decreased air movement Post SABA: wheezes and rhonchi throughout all lung fields Musculoskeletal:     Cervical back: Normal range of motion.  Lymphadenopathy:     Cervical: No cervical adenopathy.  Skin:    General: Skin is warm and dry.     Coloration: Skin is not jaundiced or pale.     Findings: No erythema.  Neurological:     Mental Status: She is alert and oriented to person, place, and time.  Motor: Weakness present.     Gait: Gait normal.  Psychiatric:        Mood and Affect: Mood normal.        Behavior: Behavior normal.        Thought Content: Thought content normal.        Judgment: Judgment normal.     Results for orders placed or performed in visit on 05/14/20  SARS-COV-2 RNA,(COVID-19) QUAL NAAT   Specimen: Respiratory  Result Value Ref Range   SARS CoV2 RNA NOT DETECTED NOT DETECT      Assessment & Plan:   Problem List Items Addressed This Visit      Respiratory   COPD exacerbation ([place])    Acute, ongoing.  Recently had filter changed to oxygen tank and reports this was very dusty.  Patient thinks this may have caused current issue.  Worried for pneumonia as patient was recently treated with azithromycin ~2 weeks ago and had complete resolution of symptoms.  Also with decreased air movement pre-SABA today in clinic, however oxygen saturation normal today in clinic.   Will obtain chest x-ray to rule out pneumonia and in meantime, start 5 day course of levofloxacin.  Side effects discussed - patient to seek  urgent/emergent care with any new muscle/ligament pain.  Also continue guaifenesin to help loosen secretions.  With good results and increased air movement post-SABA, will order nebulizer for patient to use at home with prn albuterol solution q6 hours prn wheezing.  Will continue current maintenance inhaler regimen.  Follow up if symptoms persist near end of week and pending chest x-ray results.      Relevant Medications   guaiFENesin (ROBITUSSIN) 100 MG/5ML SOLN   albuterol (PROVENTIL) (2.5 MG/3ML) 0.083% nebulizer solution     Other   Gait instability    Will order rollator walker from [employer] per patient request.       Other Visit Diagnoses    Cough    -  Primary   Relevant Orders   DG Chest 2 View   Chest congestion       Relevant Orders   DG Chest 2 View       Follow up plan: Return if symptoms worsen or fail to improve.

## 2020-05-27 NOTE — Telephone Encounter (Signed)
Patient seen in office and requires DME: nebulizer and supplies, and rollator walker.   Orders sent to [facility] under PCP name as  NP cannot sign Medicare DME orders.

## 2020-05-27 NOTE — Assessment & Plan Note (Addendum)
Will order rollator walker from [employer] per patient request.

## 2020-05-27 NOTE — Telephone Encounter (Signed)
ok 

## 2020-05-27 NOTE — Assessment & Plan Note (Signed)
Acute, ongoing.  Recently had filter changed to oxygen tank and reports this was very dusty.  Patient thinks this may have caused current issue.  Worried for pneumonia as patient was recently treated with azithromycin ~2 weeks ago and had complete resolution of symptoms.  Also with decreased air movement pre-SABA today in clinic, however oxygen saturation normal today in clinic.   Will obtain chest x-ray to rule out pneumonia and in meantime, start 5 day course of levofloxacin.  Side effects discussed - patient to seek urgent/emergent care with any new muscle/ligament pain.  Also continue guaifenesin to help loosen secretions.  With good results and increased air movement post-SABA, will order nebulizer for patient to use at home with prn albuterol solution q6 hours prn wheezing.  Will continue current maintenance inhaler regimen.  Follow up if symptoms persist near end of week and pending chest x-ray results.

## 2020-06-04 ENCOUNTER — Telehealth (HOSPITAL_COMMUNITY): Payer: Self-pay

## 2020-06-04 ENCOUNTER — Emergency Department (HOSPITAL_COMMUNITY): Payer: Medicare Other

## 2020-06-04 ENCOUNTER — Emergency Department (HOSPITAL_COMMUNITY)
Admission: EM | Admit: 2020-06-04 | Discharge: 2020-06-04 | Disposition: A | Discharge status: Home / Self Care | Payer: Medicare Other | Attending: Emergency Medicine | Admitting: Emergency Medicine

## 2020-06-04 DIAGNOSIS — Z87891 Personal history of nicotine dependence: Secondary | ICD-10-CM | POA: Insufficient documentation

## 2020-06-04 DIAGNOSIS — I4891 Unspecified atrial fibrillation: Secondary | ICD-10-CM

## 2020-06-04 DIAGNOSIS — E119 Type 2 diabetes mellitus without complications: Secondary | ICD-10-CM | POA: Diagnosis not present

## 2020-06-04 DIAGNOSIS — R0602 Shortness of breath: Secondary | ICD-10-CM | POA: Diagnosis not present

## 2020-06-04 DIAGNOSIS — I10 Essential (primary) hypertension: Secondary | ICD-10-CM | POA: Insufficient documentation

## 2020-06-04 DIAGNOSIS — Z85828 Personal history of other malignant neoplasm of skin: Secondary | ICD-10-CM | POA: Diagnosis not present

## 2020-06-04 DIAGNOSIS — J45909 Unspecified asthma, uncomplicated: Secondary | ICD-10-CM | POA: Diagnosis not present

## 2020-06-04 DIAGNOSIS — Z79899 Other long term (current) drug therapy: Secondary | ICD-10-CM | POA: Diagnosis not present

## 2020-06-04 DIAGNOSIS — Z7901 Long term (current) use of anticoagulants: Secondary | ICD-10-CM | POA: Insufficient documentation

## 2020-06-04 DIAGNOSIS — J449 Chronic obstructive pulmonary disease, unspecified: Secondary | ICD-10-CM | POA: Diagnosis not present

## 2020-06-04 DIAGNOSIS — R531 Weakness: Secondary | ICD-10-CM | POA: Diagnosis present

## 2020-06-04 LAB — CBC
HCT: 36.5 % (ref 36.0–46.0)
Hemoglobin: 11.7 g/dL — ABNORMAL LOW (ref 12.0–15.0)
MCH: 29.3 pg (ref 26.0–34.0)
MCHC: 32.1 g/dL (ref 30.0–36.0)
MCV: 91.5 fL (ref 80.0–100.0)
Platelets: 327 10*3/uL (ref 150–400)
RBC: 3.99 MIL/uL (ref 3.87–5.11)
RDW: 14.3 % (ref 11.5–15.5)
WBC: 10.7 10*3/uL — ABNORMAL HIGH (ref 4.0–10.5)
nRBC: 0 % (ref 0.0–0.2)

## 2020-06-04 LAB — BASIC METABOLIC PANEL
Anion gap: 12 (ref 5–15)
BUN: 12 mg/dL (ref 8–23)
CO2: 24 mmol/L (ref 22–32)
Calcium: 8.8 mg/dL — ABNORMAL LOW (ref 8.9–10.3)
Chloride: 104 mmol/L (ref 98–111)
Creatinine, Ser: 0.81 mg/dL (ref 0.44–1.00)
GFR, Estimated: >60 mL/min (ref >60)
Glucose, Bld: 125 mg/dL — ABNORMAL HIGH (ref 70–99)
Potassium: 3.4 mmol/L — ABNORMAL LOW (ref 3.5–5.1)
Sodium: 140 mmol/L (ref 135–145)

## 2020-06-04 LAB — MAGNESIUM: Magnesium: 1.7 mg/dL (ref 1.7–2.4)

## 2020-06-04 LAB — PROTIME-INR
INR: 1.3 — ABNORMAL HIGH (ref 0.8–1.2)
Prothrombin Time: 15.3 seconds — ABNORMAL HIGH (ref 11.4–15.2)

## 2020-06-04 MED ORDER — METHYLPREDNISOLONE SODIUM SUCC 125 MG IJ SOLR
125.0000 mg | Freq: Once | INTRAMUSCULAR | Status: AC
  Administered 2020-06-04: 06:00:00 125 mg via INTRAVENOUS
  Filled 2020-06-04: qty 2

## 2020-06-04 MED ORDER — ONDANSETRON HCL 4 MG/2ML IJ SOLN
4.0000 mg | Freq: Once | INTRAMUSCULAR | Status: AC
  Administered 2020-06-04: 07:00:00 4 mg via INTRAVENOUS
  Filled 2020-06-04: qty 2

## 2020-06-04 MED ORDER — ALBUTEROL SULFATE (2.5 MG/3ML) 0.083% IN NEBU
5.0000 mg | INHALATION_SOLUTION | Freq: Once | RESPIRATORY_TRACT | Status: AC
  Administered 2020-06-04: 06:00:00 5 mg via RESPIRATORY_TRACT
  Filled 2020-06-04: qty 6

## 2020-06-04 MED ORDER — IPRATROPIUM BROMIDE 0.02 % IN SOLN
0.5000 mg | Freq: Once | RESPIRATORY_TRACT | Status: AC
  Administered 2020-06-04: 06:00:00 0.5 mg via RESPIRATORY_TRACT
  Filled 2020-06-04: qty 2.5

## 2020-06-04 NOTE — Discharge Instructions (Addendum)
Call your cardiologist in the morning to schedule follow-up and to discuss the possibility of adding an additional medication for control of your heart rate.  If you develop worsening symptoms of weakness, shortness of breath, chest pain, palpitations, return to the ED for evaluation.  Continue your Eliquis as prescribed.

## 2020-06-04 NOTE — ED Provider Notes (Signed)
[facility] EMERGENCY DEPARTMENT Provider Note   CSN: [id number] Arrival date & time: 06/04/20  0351     History Chief Complaint  Patient presents with  . Atrial Fibrillation    Diane Patterson is a 74 y.o. female.   74 y/o female with hx of PAF (on Eliquis), HTN, COPD presents to the ED for evaluation of generalized weakness and shortness of breath.  Symptoms have been persistent over the past 2 days.  States that her shortness of breath has been aggravated by exertion.  She has tried using her home inhalers for COPD without relief.  Has no chest pain, chest tightness, palpitations.  On EMS arrival, patient found to be in A. fib with RVR.  Rate ranging from 180 bpm to 200 bpm.  She was given 350 cc normal saline as well as a 10 mg bolus of Cardizem in route.  Continues to be in atrial fibrillation, but rate is around the 130s at this time.  Denies any recent viral illness, syncope, nausea, vomiting, leg swelling.  Has been fully compliant with her Cardizem as well as her Eliquis daily.       Past Medical History:  Diagnosis Date  . Asthma   . Chronic airflow obstruction (HCC)   . Hypertension   . Hypotension   . PAF (paroxysmal atrial fibrillation) ([place])   . Tobacco abuse   . Venous insufficiency     Patient Active Problem List   Diagnosis Date Noted  . Gait instability 05/27/2020  . Paroxysmal A-fib ([place]) 03/03/2020  . Diabetes mellitus type 2, uncomplicated ([place]) [id number]  . Atrial fibrillation with RVR ([place])   . AKI (acute kidney injury) ([place]) 07/11/2017  . Chronic respiratory failure with hypoxia ([place]) 11/15/2016  . Allergic rhinitis 10/14/2016  . Contusion 12/01/2015  . Abscess of umbilicus [id number]  . COPD exacerbation ([place]) 06/23/2015  . Right carotid bruit 08/19/2014  . Hyperlipidemia 08/19/2014  . Prediabetes 08/19/2014  . Bruising 09/26/2013  . History of basal cell cancer 09/26/2013  . Oral candidiasis 01/10/2013  . Essential  hypertension 12/21/2011  . COPD (chronic obstructive pulmonary disease) ([place]) 01/20/2011  . Constipation 06/24/2010  . ABDOMINAL BLOATING 06/24/2010  . ATHEROSCLEROSIS OF AORTA 02/10/2010  . FATTY LIVER DISEASE 02/10/2010  . Anxiety state 05/14/2008  . Former smoker 12/28/2006  . VENOUS INSUFFICIENCY 12/28/2006    Past Surgical History:  Procedure Laterality Date  . cataract    . NECK SURGERY    . no colonoscopy     "afraid to "; [facility] reviewed  . VESICOVAGINAL FISTULA CLOSURE W/ TAH       OB History   No obstetric history on file.     Family History  Problem Relation Age of Onset  . Diabetes Mother   . Emphysema Father   . Asthma Father   . Heart disease Father     Social History   Tobacco Use  . Smoking status: Former Smoker    Packs/day: 0.75    Years: 15.00    Pack years: 11.25    Types: Cigarettes    Quit date: 03/20/2014    Years since quitting: 6.2  . Smokeless tobacco: Former Systems developer    Quit date: 03/11/2014  Vaping Use  . Vaping Use: Never used  Substance Use Topics  . Alcohol use: No    Alcohol/week: 0.0 standard drinks  . Drug use: No    Home Medications Prior to Admission medications   Medication Sig Start Date End  Date Taking? Authorizing Provider  albuterol (PROVENTIL) (2.5 MG/3ML) 0.083% nebulizer solution Take 3 mLs (2.5 mg total) by nebulization every 6 (six) hours as needed for wheezing or shortness of breath. 05/27/20   Eulogio Bear, NP  albuterol (VENTOLIN HFA) 108 (90 Base) MCG/ACT inhaler Inhale 2 puffs into the lungs every 4 (four) hours as needed for wheezing or shortness of breath. 05/14/20   [place], Modena Nunnery, MD  apixaban (ELIQUIS) 5 MG TABS tablet Take 1 tablet (5 mg total) by mouth 2 (two) times daily. 12/25/19   Jerline Pain, MD  azithromycin (ZITHROMAX) 250 MG tablet Take 2 tablets x 1 day, then 1 tablet daily x 4 days 05/14/20   Alycia Rossetti, MD  budesonide-formoterol [facility]) 160-4.5 MCG/ACT inhaler INHALE 2 PUFFS  INTO LUNGS TWICE DAILY 05/14/20   Alycia Rossetti, MD  diltiazem (CARDIZEM CD) 240 MG 24 hr capsule TAKE 1 CAPSULE BY MOUTH EVERY DAY 04/15/20   Jerline Pain, MD  guaiFENesin (ROBITUSSIN) 100 MG/5ML SOLN Take 5 mLs (100 mg total) by mouth every 4 (four) hours as needed for cough or to loosen phlegm. 05/27/20   Eulogio Bear, NP  hydrochlorothiazide (MICROZIDE) 12.5 MG capsule Take 1 capsule (12.5 mg total) by mouth daily. 04/11/20 07/10/20  Dunn, Nedra Hai, PA-C  levofloxacin (LEVAQUIN) 500 MG tablet Take 1 tablet (500 mg total) by mouth daily. 05/27/20   Eulogio Bear, NP  potassium chloride SA (KLOR-CON) 20 MEQ tablet Take 1 tablet (20 mEq total) by mouth daily. 04/11/20   Dunn, Nedra Hai, PA-C  predniSONE (DELTASONE) 10 MG tablet Take 40mg  x 3 days,20mg  x 3 days, 10mg  x 3 days 05/14/20   Alycia Rossetti, MD    Allergies    Patient has no known allergies.  Review of Systems   Review of Systems  Ten systems reviewed and are negative for acute change, except as noted in the HPI.    Physical Exam Updated Vital Signs BP (!) 150/79   Pulse 95   Temp 97.9 F (36.6 C) (Oral)   Resp (!) 24   Ht 5\' 7"  (1.702 m)   Wt 46.3 kg   LMP  (LMP Unknown)   SpO2 96%   BMI 15.98 kg/m   Physical Exam Vitals and nursing note reviewed.  Constitutional:      General: She is not in acute distress.    Appearance: She is well-developed and well-nourished. She is not diaphoretic.     Comments: Thin, frail appearing. Nontoxic.  HENT:     Head: Normocephalic and atraumatic.  Eyes:     General: No scleral icterus.    Extraocular Movements: EOM normal.     Conjunctiva/sclera: Conjunctivae normal.  Cardiovascular:     Rate and Rhythm: Tachycardia present. Rhythm irregular.     Pulses: Normal pulses.  Pulmonary:     Effort: Pulmonary effort is normal. No respiratory distress.     Breath sounds: No stridor. No wheezing.     Comments: Tachypneic with clear lung sounds b/l. Chest expansion  symmetric. Musculoskeletal:        General: Normal range of motion.     Cervical back: Normal range of motion.     Comments: No BLE edema.  Skin:    General: Skin is warm and dry.     Coloration: Skin is not pale.     Findings: No erythema or rash.  Neurological:     Mental Status: She is alert and oriented to person,  place, and time.     Coordination: Coordination normal.  Psychiatric:        Mood and Affect: Mood and affect normal.        Behavior: Behavior normal.     ED Results / Procedures / Treatments   Labs (all labs ordered are listed, but only abnormal results are displayed) Labs Reviewed  CBC - Abnormal; Notable for the following components:      Result Value   WBC 10.7 (*)    Hemoglobin 11.7 (*)    All other components within normal limits  BASIC METABOLIC PANEL - Abnormal; Notable for the following components:   Potassium 3.4 (*)    Glucose, Bld 125 (*)    Calcium 8.8 (*)    All other components within normal limits  PROTIME-INR - Abnormal; Notable for the following components:   Prothrombin Time 15.3 (*)    INR 1.3 (*)    All other components within normal limits  MAGNESIUM    EKG EKG Interpretation  Date/Time:  Wednesday June 04 2020 04:02:23 EST Ventricular Rate:  127 PR Interval:    QRS Duration: 87 QT Interval:  346 QTC Calculation: 523 R Axis:   -51 Text Interpretation: Atrial fibrillation Left anterior fascicular block Consider anterior infarct Borderline repolarization abnormality Prolonged QT interval Confirmed by Ripley Fraise [phone]) on 06/04/2020 4:10:26 AM    EKG Interpretation  Date/Time:  Wednesday June 04 2020 04:33:36 EST Ventricular Rate:  103 PR Interval:    QRS Duration: 87 QT Interval:  352 QTC Calculation: 461 R Axis:   -60 Text Interpretation: Sinus tachycardia RSR' in V1 or V2, right VCD or RVH Inferior infarct, old Consider anterior infarct now back in sinus rhythm when compared to prior Confirmed by  Ripley Fraise [phone]) on 06/04/2020 4:48:21 AM       Radiology DG Chest 2 View  Result Date: 06/04/2020 CLINICAL DATA:  Atrial fibrillation and weakness EXAM: CHEST - 2 VIEW COMPARISON:  03/03/2020 FINDINGS: Hyperinflation and emphysematous lucency. Band of scarring along an upper major fissure on the lateral view. There is no edema, consolidation, effusion, or pneumothorax. Normal heart size. Chronic aortic tortuosity. IMPRESSION: COPD without acute superimposed finding. Electronically Signed   By: Monte Fantasia M.D.   On: 06/04/2020 04:43    Procedures Procedures (including critical care time)  Medications Ordered in ED Medications  albuterol (PROVENTIL) (2.5 MG/3ML) 0.083% nebulizer solution 5 mg (5 mg Nebulization Given 06/04/20 0609)  ipratropium (ATROVENT) nebulizer solution 0.5 mg (0.5 mg Nebulization Given 06/04/20 0546)  methylPREDNISolone sodium succinate (SOLU-MEDROL) 125 mg/2 mL injection 125 mg (125 mg Intravenous Given 06/04/20 0546)  ondansetron (ZOFRAN) injection 4 mg (4 mg Intravenous Given 06/04/20 8295)    ED Course  I have reviewed the triage vital signs and the nursing notes.  Pertinent labs & imaging results that were available during my care of the patient were reviewed by me and considered in my medical decision making (see chart for details).  Clinical Course as of 06/04/20 0701  Wed Jun 04, 2020  0415 Patient in Afib with RVR rate of 130-140. Hemodynamically stable. Compliant with her Eliquis BID. Feels she may be a good cardioversion candidate for rate control. MD Wickline to assess. [AO]  1308 Patient back in sinus rhythm, noted by MD Wickline at bedside. Will repeat EKG. Wheezing now. Will give DuoNeb and Solumedrol. [KH]  [id number] Patient maintaining a normal sinus rhythm.  She has minimal wheezing throughout, but no complaints of shortness of breath.  Expresses comfort with plan for discharge.  Instructed to follow-up with her cardiologist to discuss  possible changes to her current medication regimen. [KH]    Clinical Course User Index [KH] Antonietta Breach, PA-C   MDM Rules/Calculators/A&P                          74 year old female presenting for weakness and shortness of breath similar to prior exacerbations of her atrial fibrillation.  Received 10 mg Cardizem by EMS in transport.  Self converted shortly after ED arrival without additional intervention.  Has remained in normal sinus rhythm for at least 2 hours.  On multiple reassessments, remains asymptomatic.  She was given a breathing treatment for wheezing 2/2 chronic COPD which has improved.  Patient instructed to follow-up with her cardiologist to discuss the possibility of adding a rate control medication to her existing regimen.  Encouraged her to continue her prescribed Eliquis.  Return precautions discussed and provided.  Patient discharged in stable condition with no unaddressed concerns.   Final Clinical Impression(s) / ED Diagnoses Final diagnoses:  Atrial fibrillation with RVR [facility])    Rx / DC Orders ED Discharge Orders    None       Antonietta Breach, PA-C 06/04/20 0703    Ripley Fraise, MD 06/05/20 [phone]

## 2020-06-04 NOTE — Telephone Encounter (Signed)
Contacted patient regarding follow up from the ED for her A-fib. Left message for patient to call back.

## 2020-06-04 NOTE — ED Triage Notes (Signed)
Pt bib gems for a fib rvr. Pt originally called EMS for weakness x2-3 days. EMS found pt to be in a-fib with a rate of 180-200. 382mL NS and 10 mg Cardizem given PTA. HR 120-140's on arrival.

## 2020-06-27 ENCOUNTER — Ambulatory Visit: Payer: Medicare Other | Admitting: Cardiology

## 2020-07-07 ENCOUNTER — Encounter: Payer: Self-pay | Admitting: Family Medicine

## 2020-07-07 ENCOUNTER — Ambulatory Visit (INDEPENDENT_AMBULATORY_CARE_PROVIDER_SITE_OTHER): Payer: Medicare Other | Admitting: Family Medicine

## 2020-07-07 ENCOUNTER — Other Ambulatory Visit: Payer: Self-pay

## 2020-07-07 VITALS — BP 150/90 | HR 80 | Temp 98.1°F | Ht 67.0 in | Wt 102.0 lb

## 2020-07-07 DIAGNOSIS — I714 Abdominal aortic aneurysm, without rupture, unspecified: Secondary | ICD-10-CM

## 2020-07-07 DIAGNOSIS — R634 Abnormal weight loss: Secondary | ICD-10-CM

## 2020-07-07 DIAGNOSIS — J441 Chronic obstructive pulmonary disease with (acute) exacerbation: Secondary | ICD-10-CM | POA: Diagnosis not present

## 2020-07-07 DIAGNOSIS — J449 Chronic obstructive pulmonary disease, unspecified: Secondary | ICD-10-CM

## 2020-07-07 MED ORDER — PREDNISONE 20 MG PO TABS: ORAL_TABLET | ORAL | 0 refills | Status: DC

## 2020-07-07 MED ORDER — FLUTICASONE PROPIONATE 50 MCG/ACT NA SUSP
2.0000 | Freq: Every day | NASAL | 6 refills | Status: DC
Start: 2020-07-07 — End: 2020-11-20

## 2020-07-07 MED ORDER — BUDESONIDE-FORMOTEROL FUMARATE 160-4.5 MCG/ACT IN AERO
INHALATION_SPRAY | RESPIRATORY_TRACT | 11 refills | Status: DC
Start: 2020-07-07 — End: 2020-07-22

## 2020-07-07 MED ORDER — PREDNISONE 10 MG PO TABS: 10.0000 mg | ORAL_TABLET | Freq: Every day | ORAL | 0 refills | Status: DC

## 2020-07-07 MED ORDER — ALBUTEROL SULFATE HFA 108 (90 BASE) MCG/ACT IN AERS: 2.0000 | INHALATION_SPRAY | RESPIRATORY_TRACT | 3 refills | Status: DC | PRN

## 2020-07-07 MED ORDER — AZITHROMYCIN 250 MG PO TABS: ORAL_TABLET | ORAL | 0 refills | Status: DC

## 2020-07-07 NOTE — Progress Notes (Signed)
Subjective:    Patient ID: Diane Patterson, female    DOB: Feb 20, 1946, 75 y.o.   MRN: [id number]  HPI 03/14/20 Patient was recently admitted to the hospital September 21 for chest pain shortness of breath and tachycardia.  She was found to be in atrial fibrillation with rapid ventricular response.  There was also thought that she could be dehydrated.  She received IV fluid and the patient spontaneously converted back to normal sinus rhythm.  She did have elevated troponin levels while in the hospital and was thought to have ischemic strain secondary to rapid ventricular response.  She has an appointment to see her cardiologist next week.  Today she is on Cardizem 240 mg daily.  She is back in normal sinus rhythm and her heart rate is well controlled at 70 beats a minute on my exam.  She denies any chest pain.  She denies any new shortness of breath beyond her baseline COPD that requires 2 L of oxygen daily.  She did discontinue hydrochlorothiazide as recommended by the hospital.  Her blood pressure remains well controlled today.  However she is still taking her potassium supplement although she is no longer on her diuretic.  She is also due for a flu shot.  At that time, my plan was: Patient's blood pressure is still well controlled despite holding hydrochlorothiazide.  I recommended that she discontinue potassium as she is no longer on a diuretic.  Recheck potassium today with a CMP.  Patient is currently in normal sinus rhythm.  Her rate is appropriately controlled with diltiazem.  She denies any palpitations or syncope or near syncope.  She denies any shortness of breath.  Follow-up as planned with cardiology.  Patient received her flu shot today.  I plan to see the patient back in 1 month for recheck and at that time I will recheck her potassium off the supplement.  At that point we discussed preventative care including bone density test.  Given her frail status, I do not feel that she is  appropriate for cancer screening such as a colonoscopy or mammogram but I will discuss that more with her at that point.  We will also be due to recheck her A1c.  Her last A1c in August was outstanding at 6.1.  04/04/20 Wt Readings from Last 3 Encounters:  07/07/20 102 lb (46.3 kg)  06/04/20 102 lb (46.3 kg)  05/27/20 106 lb (48.1 kg)   Patient states that over the last 3 weeks, she has been having hot flashes. She states that suddenly at night she will feel like she is burning up. She will have to kick off the covers and she will have night sweats. She also has sudden hot flashes during the daytime for no reason. She equates it to hot flashes with menopause however she never has experienced them before. They have only been occurring over the last 3 weeks. Also she is losing weight. Her weight has dropped from 115 in August to 105 today. Her appetite has been diminished. During this exact same time she lost her husband. She has been under stress dealing with the finances of the estate and grieving his loss. Therefore some of this could be related to stress and anxiety. However given her age and her history of tobacco abuse infections and malignancy are also a concern.  At that time, my plan was: I am very concerned about the weight loss and the hot flashes and the night sweats. Some of this can certainly  be due to stress and anxiety related to the death of her husband and handling the closing of the estate. I will start Remeron 30 mg p.o. nightly as an appetite stimulant and hopefully help her sleep and relax better. However I believe we need to undertake a work-up starting with a stool test for blood, CBC, CMP, and TSH. If lab work and stool test are normal, consider a work-up for malignancy including a mammogram, and a chest x-ray. Meanwhile start the patient on Remeron 30 mg p.o. nightly as I have stated earlier as an appetite stimulant. Reassess the patient in 3 to 4 weeks.  07/07/20 Patient continues  to experience weight loss.  Her weight was 115 pounds in August.  Today is 102 pounds.  She states that she is eating normally.  She has normal appetite however she continues to lose weight.  She also reports dyspnea on exertion and increasing shortness of breath.  Today on examination she has diffuse expiratory wheezes and diminished breath sounds bilaterally.  She was given prednisone back in December.  She is been off all prednisone since December however over the last week her breathing has worsened.  She denies any purulent sputum.  She denies any increase in sputum production however she is having to use her albuterol more frequently in addition to the Symbicort that she uses twice a day.  Of note she has a 4 cm AAA that was seen on a CT scan from 2019.  That has not been followed up upon since that time. Past Medical History:  Diagnosis Date  . Asthma   . Chronic airflow obstruction (HCC)   . Hypertension   . Hypotension   . PAF (paroxysmal atrial fibrillation) ([place])   . Tobacco abuse   . Venous insufficiency    Past Surgical History:  Procedure Laterality Date  . cataract    . NECK SURGERY    . no colonoscopy     "afraid to "; [facility] reviewed  . VESICOVAGINAL FISTULA CLOSURE W/ TAH     Current Outpatient Medications on File Prior to Visit  Medication Sig Dispense Refill  . albuterol (PROVENTIL) (2.5 MG/3ML) 0.083% nebulizer solution Take 3 mLs (2.5 mg total) by nebulization every 6 (six) hours as needed for wheezing or shortness of breath. 150 mL 1  . apixaban (ELIQUIS) 5 MG TABS tablet Take 1 tablet (5 mg total) by mouth 2 (two) times daily. 180 tablet 1  . diltiazem (CARDIZEM CD) 240 MG 24 hr capsule TAKE 1 CAPSULE BY MOUTH EVERY DAY 90 capsule 3  . guaiFENesin (ROBITUSSIN) 100 MG/5ML SOLN Take 5 mLs (100 mg total) by mouth every 4 (four) hours as needed for cough or to loosen phlegm. 236 mL 0  . hydrochlorothiazide (MICROZIDE) 12.5 MG capsule Take 1 capsule (12.5 mg total) by mouth  daily. 90 capsule 3  . potassium chloride SA (KLOR-CON) 20 MEQ tablet Take 1 tablet (20 mEq total) by mouth daily. 90 tablet 3  . levofloxacin (LEVAQUIN) 500 MG tablet Take 1 tablet (500 mg total) by mouth daily. (Patient not taking: Reported on 07/07/2020) 5 tablet 0   No current facility-administered medications on file prior to visit.   No Known Allergies Social History   Socioeconomic History  . Marital status: Married    Spouse name: Not on file  . Number of children: 1  . Years of education: Not on file  . Highest education level: Not on file  Occupational History  . Not on file  Tobacco Use  . Smoking status: Former Smoker    Packs/day: 0.75    Years: 15.00    Pack years: 11.25    Types: Cigarettes    Quit date: 03/20/2014    Years since quitting: 6.3  . Smokeless tobacco: Former Systems developer    Quit date: 03/11/2014  Vaping Use  . Vaping Use: Never used  Substance and Sexual Activity  . Alcohol use: No    Alcohol/week: 0.0 standard drinks  . Drug use: No  . Sexual activity: Not on file  Other Topics Concern  . Not on file  Social History Narrative  . Not on file   Social Determinants of Health   Financial Resource Strain: Not on file  Food Insecurity: Not on file  Transportation Needs: Not on file  Physical Activity: Not on file  Stress: Not on file  Social Connections: Not on file  Intimate Partner Violence: Not on file     Review of Systems  All other systems reviewed and are negative.      Objective:   Physical Exam Constitutional:      General: She is not in acute distress.    Appearance: Normal appearance. She is not toxic-appearing or diaphoretic.  Cardiovascular:     Rate and Rhythm: Normal rate and regular rhythm.     Heart sounds: Normal heart sounds. No murmur heard. No friction rub. No gallop.   Pulmonary:     Effort: Pulmonary effort is normal. No respiratory distress.     Breath sounds: Decreased air movement present. Wheezing present. No  rales.  Abdominal:     General: Abdomen is flat. Bowel sounds are normal.     Palpations: Abdomen is soft.  Musculoskeletal:     Right lower leg: No edema.     Left lower leg: No edema.  Neurological:     Mental Status: She is alert.           Assessment & Plan:  Weight loss - Plan: CT Abdomen Pelvis W Contrast  COPD exacerbation ([place]) - Plan: budesonide-formoterol (SYMBICORT) 160-4.5 MCG/ACT inhaler  Chronic obstructive pulmonary disease, unspecified COPD type ([place]) - Plan: albuterol (VENTOLIN HFA) 108 (90 Base) MCG/ACT inhaler  Abdominal aortic aneurysm (AAA) without rupture ([place]) - Plan: CT Abdomen Pelvis W Contrast  Patient is having a COPD exacerbation on top of her underlying COPD.  Continue Symbicort.  Begin a prednisone taper pack and use albuterol 2 puffs every 4 hours.  After she completes the prednisone taper pack transition to prednisone 10 mg a day and gradually try to wean her off of this over the next 2 weeks.  Patient had a normal chest x-ray for her in December.  Lab work in October was unremarkable.  Therefore the next step in the evaluation for weight loss would be a CT scan of the abdomen and pelvis.  This would also allow me to reevaluate the AAA that was found on CT scan in 2019.  Therefore I will arrange this soon as possible.

## 2020-07-10 IMAGING — CT CT ABD-PELV W/ CM
2 of 5 series · 16 of 46 positions shown, 18 images · IV contrast (APPLIED)
Comparison: 02/06/2010

CLINICAL DATA: Weight loss.  Nonlocalized abdominal pain.

EXAM:
CT ABDOMEN AND PELVIS WITH CONTRAST
TECHNIQUE: Multidetector CT imaging of the abdomen and pelvis was performed
using the standard protocol following bolus administration of
intravenous contrast.
CONTRAST:  100mL OMNIPAQUE IOHEXOL 300 MG/ML  SOLN

[Series 3: abd/ pelvis 5.0 i30f 2 · axial · 0.75mm/px · z∈[+807,+1167]mm · 13 of 82 slices shown, 15 images]
[im 5/82  soft-tissue]
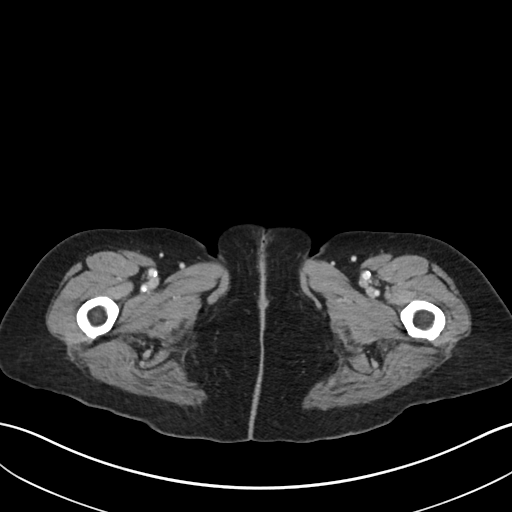
[im 5/82  bone]
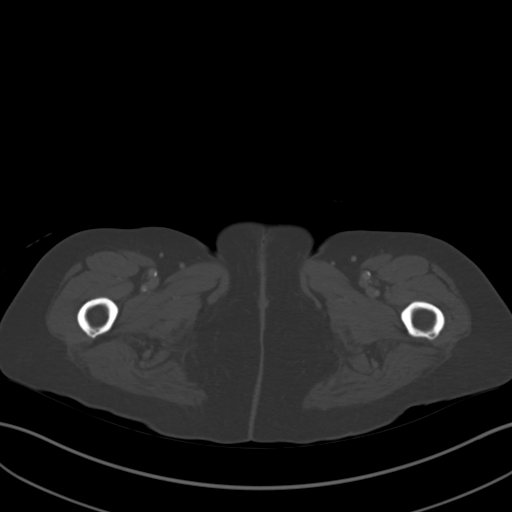
[im 13/82  soft-tissue]
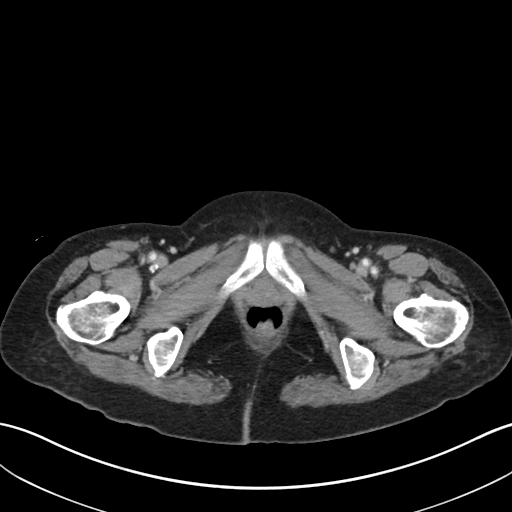
[im 18/82  soft-tissue]
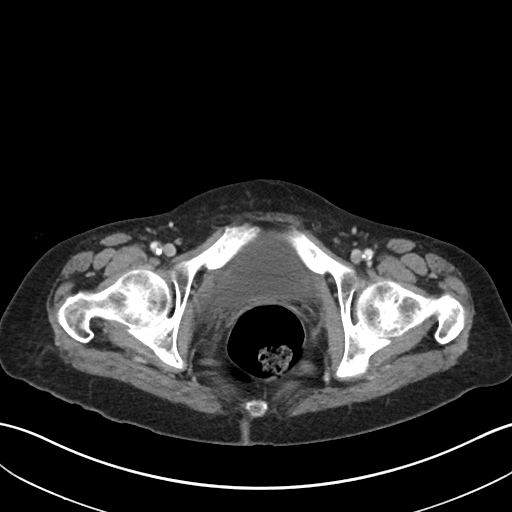
[im 22/82  soft-tissue]
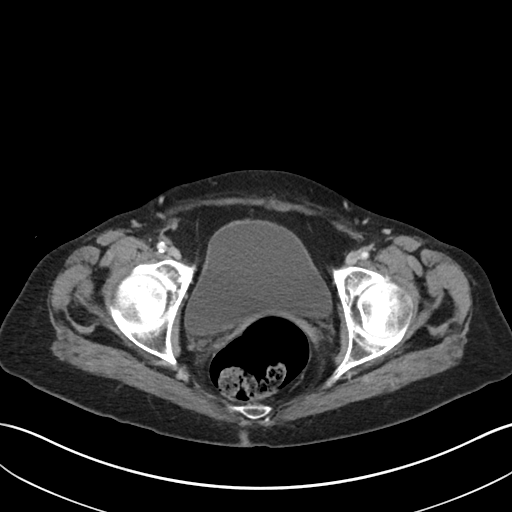
[im 30/82  soft-tissue]
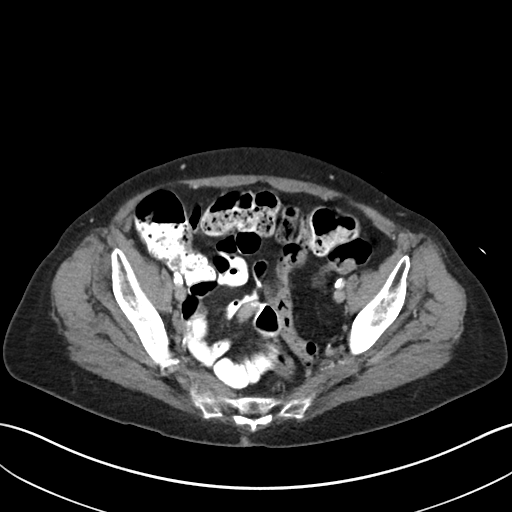
[im 35/82  soft-tissue]
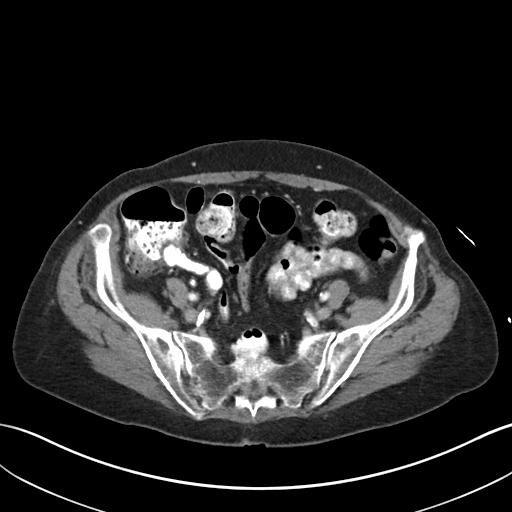
[im 43/82  soft-tissue]
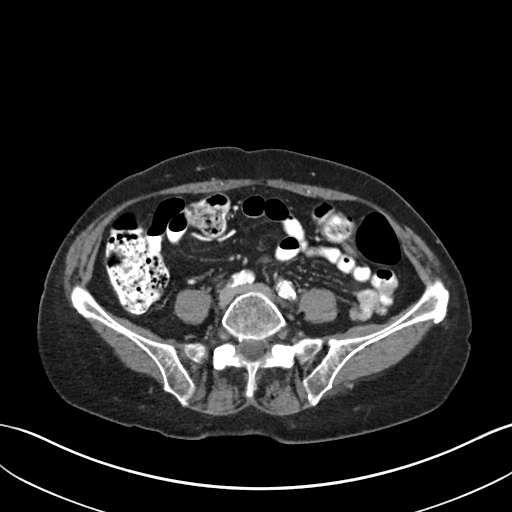
[im 47/82  soft-tissue]
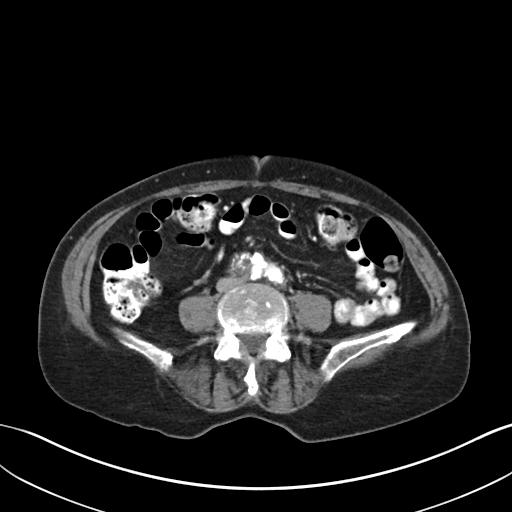
[im 52/82  soft-tissue]
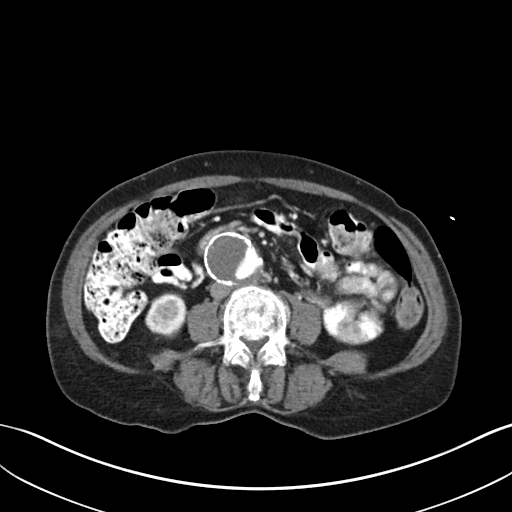
[im 52/82  bone]
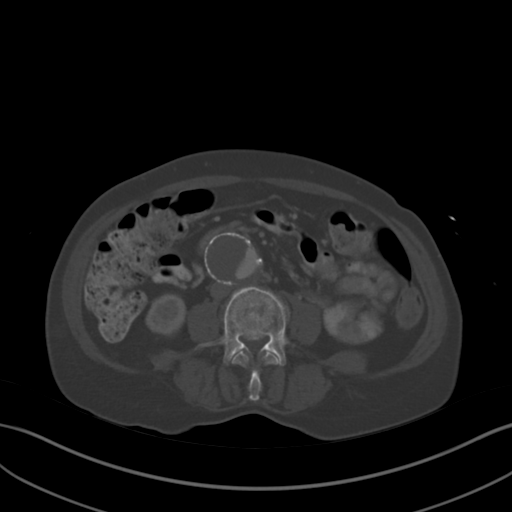
[im 60/82  soft-tissue]
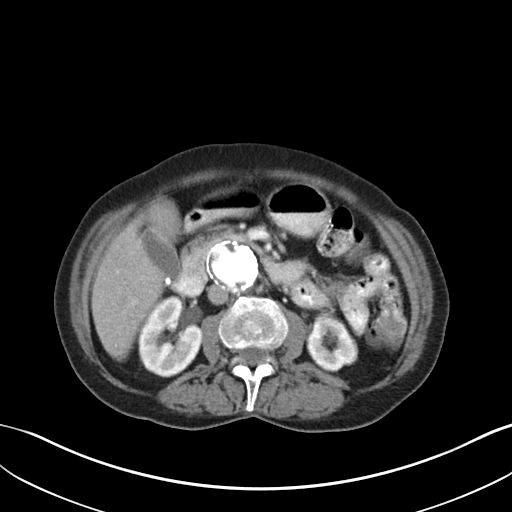
[im 64/82  soft-tissue]
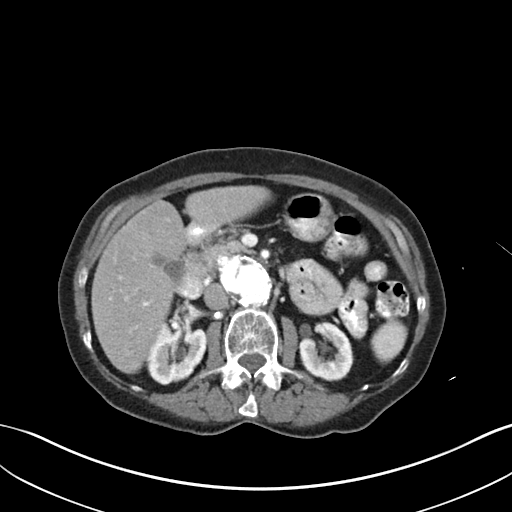
[im 69/82  soft-tissue]
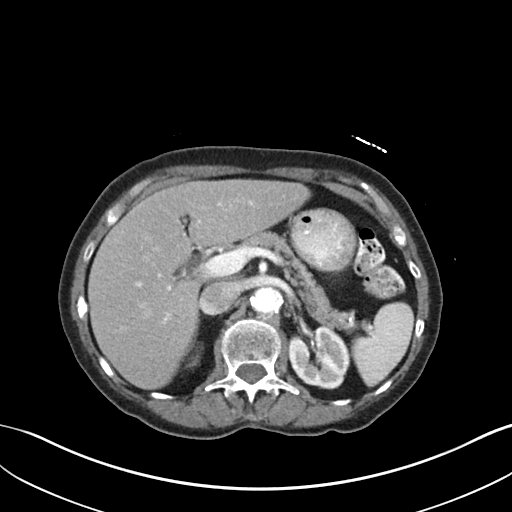
[im 77/82  soft-tissue]
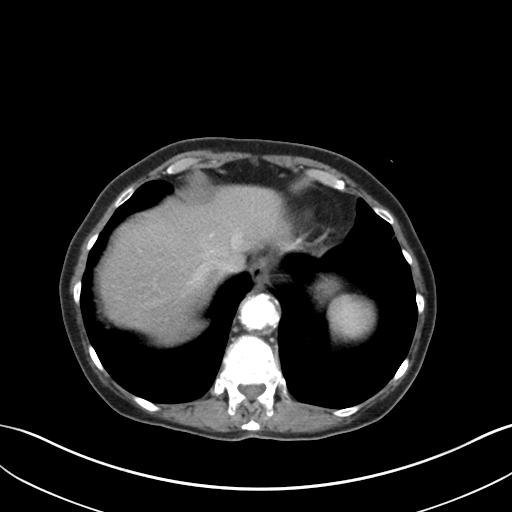

[Series 6: coronal soft tissue · coronal · 0.72mm/px · 3 of 92 slices shown]
[im 31/92  soft-tissue]
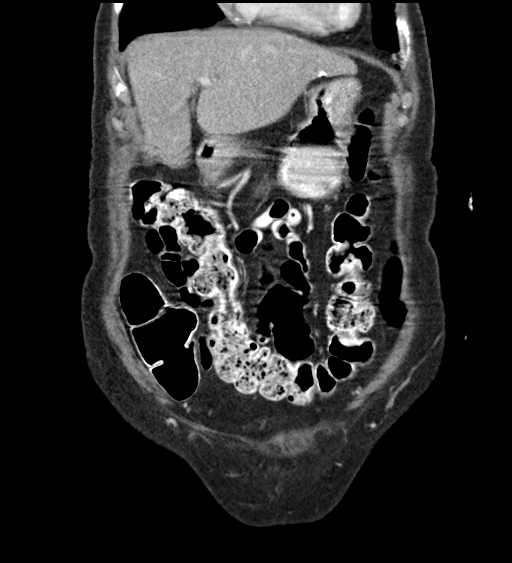
[im 41/92  soft-tissue]
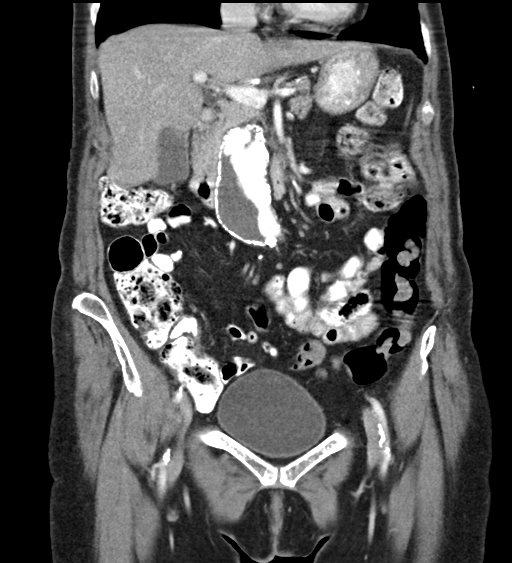
[im 51/92  soft-tissue]
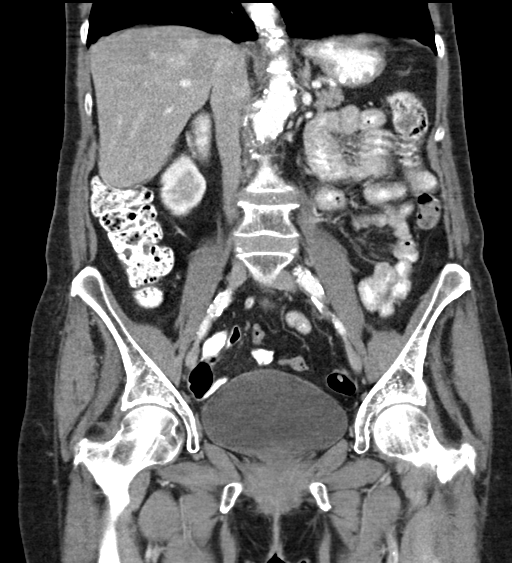

[16 of 46 positions shown; findings below may reference images not displayed]

FINDINGS: Lower chest: No acute abnormality.

Hepatobiliary: Calcified granuloma identified within the lateral
segment of left lobe of liver. No suspicious liver abnormalities. 5
mm gallstone. No gallbladder wall thickening or biliary dilatation.

Pancreas: Unremarkable. No pancreatic ductal dilatation or
surrounding inflammatory changes.

Spleen: Normal in size without focal abnormality.

Adrenals/Urinary Tract: The adrenal glands are normal.

Low-density structure within the medial cortex of the inferior pole
of right kidney measures 9 mm and likely represents a small cyst. 8
mm low-density structure arising from upper pole of left kidney is
too small to characterize. Urinary bladder appears normal. No solid
enhancing mass or hydronephrosis identified.

Stomach/Bowel: Stomach is normal. The small bowel loops have a
normal course and caliber. The appendix is visualized and appears
normal. No pathologic dilatation of the colon. No bowel wall
thickening or inflammation.

Vascular/Lymphatic: Aortic atherosclerosis. Infrarenal abdominal
aortic aneurysm measures 4 cm, image [DATE]. No adenopathy.

Reproductive: Status post hysterectomy. No adnexal masses.

Other: No ascites or fluid collections.

Musculoskeletal: No acute or significant osseous findings.
Compression fractures involving T12 and L4 are identified. The T12
compression fracture results in approximately 20% loss of superior
endplate and appears new from 07/11/2017. The L4 compression
fracture results in loss of 30% of the superior endplate and is new
from 02/06/2010.
IMPRESSION: 1. No mass or adenopathy within the abdomen or pelvis.
2.  Aortic Atherosclerosis (EPPKE-ZKM.M).
3. Infrarenal abdominal aortic aneurysm measures 4 cm. Recommend
followup by US in 1 year. This recommendation follows ACR consensus
guidelines: White Paper of the ACR Incidental Findings Committee II
aneurysm NOS (EPPKE-NLV.5).
4. T12 and L4 compression fractures. The T12 compression fracture is
new from 07/11/2017.
5. Gallstone.

## 2020-07-16 ENCOUNTER — Other Ambulatory Visit: Payer: Self-pay

## 2020-07-16 ENCOUNTER — Ambulatory Visit: Payer: Medicare Other | Admitting: Cardiology

## 2020-07-16 ENCOUNTER — Encounter: Payer: Self-pay | Admitting: Cardiology

## 2020-07-16 VITALS — BP 160/70 | HR 69 | Ht 67.0 in | Wt 102.0 lb

## 2020-07-16 DIAGNOSIS — Z79899 Other long term (current) drug therapy: Secondary | ICD-10-CM

## 2020-07-16 DIAGNOSIS — I480 Paroxysmal atrial fibrillation: Secondary | ICD-10-CM

## 2020-07-16 DIAGNOSIS — I10 Essential (primary) hypertension: Secondary | ICD-10-CM

## 2020-07-16 NOTE — Progress Notes (Signed)
Cardiology Office Note:    Date:  07/16/2020   ID:  Diane Patterson, DOB 1946-05-05, MRN [id number]  PCP:  Susy Frizzle, MD  Cardiologist:  Candee Furbish, MD  Electrophysiologist:  None   Referring MD: Susy Frizzle, MD     History of Present Illness:    Diane Patterson is a 75 y.o. female with a hx of near syncopal episode.  In review of Dr. Samella Parr note from 10/23/2018, yesterday, there was concern over possible cardiac arrhythmia or hypotension secondary to medication.  It was recommended that she discontinue her losartan completely.  She is going to buy blood pressure cuff and start checking her blood pressures.  If she feels weak or lightheaded check and see what her blood pressure is.  There was some concern that she may have transient bradycardia or tachycardia.  We have set her up for an event monitor.  Blood work was drawn and was reassuring. Just had come off ABX and prednisone.   She was having trouble with breathing, EMS came. BP was not right. No Fever. BP was low.   Week before, pain in stomach going up. Cough hard, chunk came up and fine. Felt wobbly with walking.  Requested an in office visit.  Creatinine 0.77 potassium 4.3 glucose 121 LDL 98 hemoglobin 11.6  Current EKG shows normal sinus rhythm 62 with nonspecific ST-T wave changes.  Prior EKG in 2017 showed similar findings.  Previous nuclear stress test and prior event monitor was reassuring.  Shortness of breath seem to be COPD related.  No evidence of ischemia.  Prior episode of atrial fibrillation with rapid ventricular response in February 2019.  Continuing with Eliquis.  Her husband had a previous bleed on Xarelto requiring 4 units of blood.  Being very careful with her hemoglobin monitoring  09/19/19  -Here for the follow-up of prior near syncope.  Overall she has been feeling quite well with no further episodes.  Remember, we stopped the losartan previously.  Overall she is doing well.   No significant issues.  She does have continuous shortness of breath from her COPD at times.  Is hard for her to cook for instance with oxygen.  07/16/2020-here for the follow-up of paroxysmal atrial fibrillation.  Currently in sinus rhythm.  Doing well.  Has lost some weight.  Lost her husband [name].  Compliant with her medications.  She does have to use rescue inhalers as she gets up in the middle the night to go to the bathroom.  Quite frequent nocturia.  Past Medical History:  Diagnosis Date  . Asthma   . Chronic airflow obstruction (HCC)   . Hypertension   . Hypotension   . PAF (paroxysmal atrial fibrillation) ([place])   . Tobacco abuse   . Venous insufficiency     Past Surgical History:  Procedure Laterality Date  . cataract    . NECK SURGERY    . no colonoscopy     "afraid to "; [facility] reviewed  . VESICOVAGINAL FISTULA CLOSURE W/ TAH      Current Medications: Current Meds  Medication Sig  . albuterol (PROVENTIL) (2.5 MG/3ML) 0.083% nebulizer solution Take 3 mLs (2.5 mg total) by nebulization every 6 (six) hours as needed for wheezing or shortness of breath.  Marland Kitchen albuterol (VENTOLIN HFA) 108 (90 Base) MCG/ACT inhaler Inhale 2 puffs into the lungs every 4 (four) hours as needed for wheezing or shortness of breath.  Marland Kitchen apixaban (ELIQUIS) 5 MG TABS tablet Take 1 tablet (  5 mg total) by mouth 2 (two) times daily.  . budesonide-formoterol (SYMBICORT) 160-4.5 MCG/ACT inhaler INHALE 2 PUFFS INTO LUNGS TWICE DAILY  . diltiazem (CARDIZEM CD) 240 MG 24 hr capsule TAKE 1 CAPSULE BY MOUTH EVERY DAY  . fluticasone (FLONASE) 50 MCG/ACT nasal spray Place 2 sprays into both nostrils daily.  Marland Kitchen guaiFENesin (ROBITUSSIN) 100 MG/5ML SOLN Take 5 mLs (100 mg total) by mouth every 4 (four) hours as needed for cough or to loosen phlegm.  . hydrochlorothiazide (MICROZIDE) 12.5 MG capsule Take 1 capsule (12.5 mg total) by mouth daily.  Marland Kitchen levofloxacin (LEVAQUIN) 500 MG tablet Take 1 tablet (500 mg total) by mouth  daily.  . potassium chloride SA (KLOR-CON) 20 MEQ tablet Take 1 tablet (20 mEq total) by mouth daily.  . predniSONE (DELTASONE) 10 MG tablet Take 1 tablet (10 mg total) by mouth daily with breakfast.     Allergies:   Patient has no known allergies.   Social History   Socioeconomic History  . Marital status: Married    Spouse name: Not on file  . Number of children: 1  . Years of education: Not on file  . Highest education level: Not on file  Occupational History  . Not on file  Tobacco Use  . Smoking status: Former Smoker    Packs/day: 0.75    Years: 15.00    Pack years: 11.25    Types: Cigarettes    Quit date: 03/20/2014    Years since quitting: 6.3  . Smokeless tobacco: Former Systems developer    Quit date: 03/11/2014  Vaping Use  . Vaping Use: Never used  Substance and Sexual Activity  . Alcohol use: No    Alcohol/week: 0.0 standard drinks  . Drug use: No  . Sexual activity: Not on file  Other Topics Concern  . Not on file  Social History Narrative  . Not on file   Social Determinants of Health   Financial Resource Strain: Not on file  Food Insecurity: Not on file  Transportation Needs: Not on file  Physical Activity: Not on file  Stress: Not on file  Social Connections: Not on file     Family History: The patient's family history includes Asthma in her father; Diabetes in her mother; Emphysema in her father; Heart disease in her father.  ROS:   Please see the history of present illness.     All other systems reviewed and are negative.  EKGs/Labs/Other Studies Reviewed:    The following studies were reviewed today: NUC stress: 08/12/15: 1. This study was count-poor at rest and with Lexiscan stress. There is a fixed, small, mild apical perfusion defect with normal wall motion that is likely artifact. No definite ischemia or infarction.  2. Normal EF and wall motion.  3. Low risk study.   Reassuring  Event monitor 08/12/15: No adverse rhythms.  EKG:  EKG  is  ordered today.  The ekg ordered today demonstrates sinus rhythm 65 with no other abnormalities, left axis deviation noted.  Recent Labs: 03/03/2020: B Natriuretic Peptide 74.3 04/04/2020: ALT 11; TSH 1.30 06/04/2020: BUN 12; Creatinine, Ser 0.81; Hemoglobin 11.7; Magnesium 1.7; Platelets 327; Potassium 3.4; Sodium 140  Recent Lipid Panel    Component Value Date/Time   CHOL 164 03/03/2020 2313   TRIG 93 03/03/2020 2313   HDL 58 03/03/2020 2313   CHOLHDL 2.8 03/03/2020 2313   VLDL 19 03/03/2020 2313   LDLCALC 87 03/03/2020 2313   LDLCALC 88 03/05/2019 0904   LDLDIRECT 136.0  02/10/2010 1017    Physical Exam:    VS:  BP (!) 160/70 (BP Location: Left Arm, Patient Position: Sitting, Cuff Size: Normal)   Pulse 69   Ht 5\' 7"  (1.702 m)   Wt 102 lb (46.3 kg)   LMP  (LMP Unknown)   SpO2 96%   BMI 15.98 kg/m     Wt Readings from Last 3 Encounters:  07/16/20 102 lb (46.3 kg)  07/07/20 102 lb (46.3 kg)  06/04/20 102 lb (46.3 kg)     GEN: Thin, shakey  in no acute distress  HEENT: normal  Neck: no JVD, carotid bruits, or masses Cardiac: RRR; no murmurs, rubs, or gallops,no edema  Respiratory:  clear to auscultation bilaterally, normal work of breathing GI: soft, nontender, nondistended, + BS MS: no deformity or atrophy  Skin: warm and dry, no rash Neuro:  Alert and Oriented x 3, Strength and sensation are intact Psych: euthymic mood, full affect   ASSESSMENT:    1. Essential hypertension   2. Medication management   3. PAF (paroxysmal atrial fibrillation) (HCC)    PLAN:    In order of problems listed above:  Near syncope -Previously stopped the losartan. Event monitor was overall reassuring with no evidence of bradycardia.  No further syncopal episodes.  Willing to tolerate higher blood pressure.  Paroxysmal atrial fibrillation -Prior EKG  shows sinus rhythm again. Prior episode in 2019 of A. fib. Continuing with Eliquis. No bleeding.Brusing. Hg 11.7, Creat 0.8,  rechecking lab work  Weight loss -Has had a TSH done back in October 2021, normal.  Continue to encourage protein use.  COPD -Dr. Lamonte Sakai has been monitoring.  Essential hypertension -Off losartan. Willing to tolerate a higher than normal blood pressure given her near syncopal episodes in the past.  Grieving the loss of her husband [name]   Medication Adjustments/Labs and Tests Ordered: Current medicines are reviewed at length with the patient today.  Concerns regarding medicines are outlined above.  Orders Placed This Encounter  Procedures  . CBC  . Basic metabolic panel   No orders of the defined types were placed in this encounter.   Patient Instructions  Medication Instructions:  The current medical regimen is effective;  continue present plan and medications.  *If you need a refill on your cardiac medications before your next appointment, please call your pharmacy*  Lab Work: Please have blood work today (BMP, CBC)  If you have labs (blood work) drawn today and your tests are completely normal, you will receive your results only by: Marland Kitchen MyChart Message (if you have MyChart) OR . A paper copy in the mail If you have any lab test that is abnormal or we need to change your treatment, we will call you to review the results.  Follow-Up: At [facility], you and your health needs are our priority.  As part of our continuing mission to provide you with exceptional heart care, we have created designated Provider Care Teams.  These Care Teams include your primary Cardiologist (physician) and Advanced Practice Providers (APPs -  Physician Assistants and Nurse Practitioners) who all work together to provide you with the care you need, when you need it.  We recommend signing up for the patient portal called "MyChart".  Sign up information is provided on this After Visit Summary.  MyChart is used to connect with patients for Virtual Visits (Telemedicine).  Patients are able to view  lab/test results, encounter notes, upcoming appointments, etc.  Non-urgent messages can be sent  to your provider as well.   To learn more about what you can do with MyChart, go to [email or web address].    Your next appointment:   6 month(s)  The format for your next appointment:   In Person  Provider:   Candee Furbish, MD  Thank you for choosing [facility]!!         Signed, Candee Furbish, MD  07/16/2020 4:26 PM    [place]

## 2020-07-16 NOTE — Patient Instructions (Signed)
Medication Instructions:  The current medical regimen is effective;  continue present plan and medications.  *If you need a refill on your cardiac medications before your next appointment, please call your pharmacy*  Lab Work: Please have blood work today (BMP, CBC)  If you have labs (blood work) drawn today and your tests are completely normal, you will receive your results only by: Marland Kitchen MyChart Message (if you have MyChart) OR . A paper copy in the mail If you have any lab test that is abnormal or we need to change your treatment, we will call you to review the results.  Follow-Up: At [facility], you and your health needs are our priority.  As part of our continuing mission to provide you with exceptional heart care, we have created designated Provider Care Teams.  These Care Teams include your primary Cardiologist (physician) and Advanced Practice Providers (APPs -  Physician Assistants and Nurse Practitioners) who all work together to provide you with the care you need, when you need it.  We recommend signing up for the patient portal called "MyChart".  Sign up information is provided on this After Visit Summary.  MyChart is used to connect with patients for Virtual Visits (Telemedicine).  Patients are able to view lab/test results, encounter notes, upcoming appointments, etc.  Non-urgent messages can be sent to your provider as well.   To learn more about what you can do with MyChart, go to [email or web address].    Your next appointment:   6 month(s)  The format for your next appointment:   In Person  Provider:   Candee Furbish, MD  Thank you for choosing [facility]!!

## 2020-07-17 LAB — CBC
Hematocrit: 40.2 % (ref 34.0–46.6)
Hemoglobin: 13.1 g/dL (ref 11.1–15.9)
MCH: 28.4 pg (ref 26.6–33.0)
MCHC: 32.6 g/dL (ref 31.5–35.7)
MCV: 87 fL (ref 79–97)
Platelets: 410 10*3/uL (ref 150–450)
RBC: 4.62 x10E6/uL (ref 3.77–5.28)
RDW: 13.2 % (ref 11.7–15.4)
WBC: 17 10*3/uL — ABNORMAL HIGH (ref 3.4–10.8)

## 2020-07-17 LAB — BASIC METABOLIC PANEL
BUN/Creatinine Ratio: 30 — ABNORMAL HIGH (ref 12–28)
BUN: 28 mg/dL — ABNORMAL HIGH (ref 8–27)
CO2: 18 mmol/L — ABNORMAL LOW (ref 20–29)
Calcium: 9.8 mg/dL (ref 8.7–10.3)
Chloride: 97 mmol/L (ref 96–106)
Creatinine, Ser: 0.92 mg/dL (ref 0.57–1.00)
GFR calc Af Amer: 70 mL/min/{1.73_m2} (ref >59)
GFR calc non Af Amer: 61 mL/min/{1.73_m2} (ref >59)
Glucose: 183 mg/dL — ABNORMAL HIGH (ref 65–99)
Potassium: 4.5 mmol/L (ref 3.5–5.2)
Sodium: 136 mmol/L (ref 134–144)

## 2020-07-20 ENCOUNTER — Other Ambulatory Visit: Payer: Self-pay

## 2020-07-20 ENCOUNTER — Emergency Department (HOSPITAL_COMMUNITY)
Admission: EM | Admit: 2020-07-20 | Discharge: 2020-07-20 | Disposition: A | Discharge status: Home / Self Care | Payer: Medicare Other | Attending: Emergency Medicine | Admitting: Emergency Medicine

## 2020-07-20 ENCOUNTER — Encounter (HOSPITAL_COMMUNITY): Payer: Self-pay | Admitting: *Deleted

## 2020-07-20 DIAGNOSIS — I10 Essential (primary) hypertension: Secondary | ICD-10-CM | POA: Insufficient documentation

## 2020-07-20 DIAGNOSIS — S8011XA Contusion of right lower leg, initial encounter: Secondary | ICD-10-CM | POA: Insufficient documentation

## 2020-07-20 DIAGNOSIS — Z7901 Long term (current) use of anticoagulants: Secondary | ICD-10-CM | POA: Diagnosis not present

## 2020-07-20 DIAGNOSIS — Z79899 Other long term (current) drug therapy: Secondary | ICD-10-CM | POA: Insufficient documentation

## 2020-07-20 DIAGNOSIS — E119 Type 2 diabetes mellitus without complications: Secondary | ICD-10-CM | POA: Diagnosis not present

## 2020-07-20 DIAGNOSIS — W208XXA Other cause of strike by thrown, projected or falling object, initial encounter: Secondary | ICD-10-CM | POA: Diagnosis not present

## 2020-07-20 DIAGNOSIS — T148XXA Other injury of unspecified body region, initial encounter: Secondary | ICD-10-CM

## 2020-07-20 DIAGNOSIS — Z87891 Personal history of nicotine dependence: Secondary | ICD-10-CM | POA: Diagnosis not present

## 2020-07-20 DIAGNOSIS — S8991XA Unspecified injury of right lower leg, initial encounter: Secondary | ICD-10-CM | POA: Diagnosis present

## 2020-07-20 DIAGNOSIS — J449 Chronic obstructive pulmonary disease, unspecified: Secondary | ICD-10-CM | POA: Diagnosis not present

## 2020-07-20 MED ORDER — HYDROCODONE-ACETAMINOPHEN 5-325 MG PO TABS: 1.0000 | ORAL_TABLET | Freq: Four times a day (QID) | ORAL | 0 refills | Status: DC | PRN

## 2020-07-20 MED ORDER — HYDROCODONE-ACETAMINOPHEN 5-325 MG PO TABS
1.0000 | ORAL_TABLET | Freq: Once | ORAL | Status: AC
  Administered 2020-07-20: 1 via ORAL
  Filled 2020-07-20: qty 1

## 2020-07-20 NOTE — ED Provider Notes (Signed)
[place] EMERGENCY DEPARTMENT Provider Note   CSN: [id number] Arrival date & time: 07/20/20  0606     History Chief Complaint  Patient presents with  . hematoma    Right shin- On Eliquis    Diane Patterson is a 75 y.o. female with past medical history significant for PAF on Eliquis, HTN, gait instability, chronic respiratory failure who presents for evaluation of hematoma. Had a box fall on her leg yesterday. Noted swelling to anterior right leg. Subsequently got up to use the restroom yesterday evening and noted the swelling had ruptured and was bleeding. Last dose of anticoagulate yesterday evening. Denies fever, chills, N/V, CP, SOB, Paresthesias, weakness, decreased ROM. Rates pain a 9/10. Denies additional aggravating or alleviating factors.  History obtained from patient and past medical records. No interpretor was used.  HPI     Past Medical History:  Diagnosis Date  . Asthma   . Chronic airflow obstruction (HCC)   . Hypertension   . Hypotension   . PAF (paroxysmal atrial fibrillation) ([place])   . Tobacco abuse   . Venous insufficiency     Patient Active Problem List   Diagnosis Date Noted  . Gait instability 05/27/2020  . Paroxysmal A-fib ([place]) 03/03/2020  . Diabetes mellitus type 2, uncomplicated ([place]) 71/11/2692  . Atrial fibrillation with RVR ([place])   . AKI (acute kidney injury) ([place]) 07/11/2017  . Chronic respiratory failure with hypoxia ([place]) 11/15/2016  . Allergic rhinitis 10/14/2016  . Contusion 12/01/2015  . Abscess of umbilicus 85/46/2703  . COPD exacerbation ([place]) 06/23/2015  . Right carotid bruit 08/19/2014  . Hyperlipidemia 08/19/2014  . Prediabetes 08/19/2014  . Bruising 09/26/2013  . History of basal cell cancer 09/26/2013  . Oral candidiasis 01/10/2013  . Essential hypertension 12/21/2011  . COPD (chronic obstructive pulmonary disease) ([place]) 01/20/2011  . Constipation 06/24/2010  . ABDOMINAL BLOATING 06/24/2010  .  ATHEROSCLEROSIS OF AORTA 02/10/2010  . FATTY LIVER DISEASE 02/10/2010  . Anxiety state 05/14/2008  . Former smoker 12/28/2006  . VENOUS INSUFFICIENCY 12/28/2006    Past Surgical History:  Procedure Laterality Date  . cataract    . NECK SURGERY    . no colonoscopy     "afraid to "; [facility] reviewed  . VESICOVAGINAL FISTULA CLOSURE W/ TAH       OB History   No obstetric history on file.     Family History  Problem Relation Age of Onset  . Diabetes Mother   . Emphysema Father   . Asthma Father   . Heart disease Father     Social History   Tobacco Use  . Smoking status: Former Smoker    Packs/day: 0.75    Years: 15.00    Pack years: 11.25    Types: Cigarettes    Quit date: 03/20/2014    Years since quitting: 6.3  . Smokeless tobacco: Former Systems developer    Quit date: 03/11/2014  Vaping Use  . Vaping Use: Never used  Substance Use Topics  . Alcohol use: No    Alcohol/week: 0.0 standard drinks  . Drug use: No    Home Medications Prior to Admission medications   Medication Sig Start Date End Date Taking? Authorizing Provider  HYDROcodone-acetaminophen (NORCO/VICODIN) 5-325 MG tablet Take 1 tablet by mouth every 6 (six) hours as needed. 07/20/20  Yes Kaden Dunkel A, PA-C  albuterol (PROVENTIL) (2.5 MG/3ML) 0.083% nebulizer solution Take 3 mLs (2.5 mg total) by nebulization every 6 (six) hours as needed for wheezing  or shortness of breath. 05/27/20   Eulogio Bear, NP  albuterol (VENTOLIN HFA) 108 (90 Base) MCG/ACT inhaler Inhale 2 puffs into the lungs every 4 (four) hours as needed for wheezing or shortness of breath. 07/07/20   Susy Frizzle, MD  apixaban (ELIQUIS) 5 MG TABS tablet Take 1 tablet (5 mg total) by mouth 2 (two) times daily. 12/25/19   Jerline Pain, MD  budesonide-formoterol (SYMBICORT) 160-4.5 MCG/ACT inhaler INHALE 2 PUFFS INTO LUNGS TWICE DAILY 07/07/20   Susy Frizzle, MD  diltiazem (CARDIZEM CD) 240 MG 24 hr capsule TAKE 1 CAPSULE BY MOUTH EVERY  DAY 04/15/20   Jerline Pain, MD  fluticasone (FLONASE) 50 MCG/ACT nasal spray Place 2 sprays into both nostrils daily. 07/07/20   Susy Frizzle, MD  guaiFENesin (ROBITUSSIN) 100 MG/5ML SOLN Take 5 mLs (100 mg total) by mouth every 4 (four) hours as needed for cough or to loosen phlegm. 05/27/20   Eulogio Bear, NP  hydrochlorothiazide (MICROZIDE) 12.5 MG capsule Take 1 capsule (12.5 mg total) by mouth daily. 04/11/20 07/10/20  Dunn, Nedra Hai, PA-C  levofloxacin (LEVAQUIN) 500 MG tablet Take 1 tablet (500 mg total) by mouth daily. 05/27/20   Eulogio Bear, NP  potassium chloride SA (KLOR-CON) 20 MEQ tablet Take 1 tablet (20 mEq total) by mouth daily. 04/11/20   Dunn, Nedra Hai, PA-C  predniSONE (DELTASONE) 10 MG tablet Take 1 tablet (10 mg total) by mouth daily with breakfast. 07/07/20   Susy Frizzle, MD    Allergies    Patient has no known allergies.  Review of Systems   Review of Systems  Constitutional: Negative.   HENT: Negative.   Respiratory: Negative.   Cardiovascular: Negative.   Gastrointestinal: Negative.   Genitourinary: Negative.   Musculoskeletal:       Right anterior leg pain  Skin: Negative.   Neurological: Negative.   All other systems reviewed and are negative.   Physical Exam Updated Vital Signs BP (!) 143/51 (BP Location: Left Arm)   Pulse 71   Temp 97.8 F (36.6 C) (Oral)   Resp 20   LMP  (LMP Unknown)   SpO2 100%   Physical Exam Vitals and nursing note reviewed.  Constitutional:      General: She is not in acute distress.    Appearance: She is well-developed and well-nourished. She is ill-appearing (Chronically ill appearing). She is not toxic-appearing or diaphoretic.  HENT:     Head: Normocephalic and atraumatic.     Nose: Nose normal.     Mouth/Throat:     Mouth: Mucous membranes are dry.  Eyes:     Pupils: Pupils are equal, round, and reactive to light.  Cardiovascular:     Rate and Rhythm: Normal rate.     Pulses: Normal  pulses and intact distal pulses.     Heart sounds: Normal heart sounds.  Pulmonary:     Effort: No respiratory distress.     Comments: 2L [place] Abdominal:     General: Bowel sounds are normal. There is no distension.  Musculoskeletal:        General: Signs of injury present. Normal range of motion.     Cervical back: Normal range of motion.     Comments: Moves all 4 extremities without difficulty. Tenderness to right anterior shin.  Skin:    General: Skin is warm and dry.     Comments: Hematoma to right anterior shin without overlying skin opening consistent with laceration.  Neurological:     General: No focal deficit present.     Mental Status: She is alert and oriented to person, place, and time.     Comments: Intact sensation Ambulatory without difficulty  Psychiatric:        Mood and Affect: Mood and affect normal.    ED Results / Procedures / Treatments   Labs (all labs ordered are listed, but only abnormal results are displayed) Labs Reviewed - No data to display  EKG None  Radiology No results found.  Procedures Procedures   Medications Ordered in ED Medications  HYDROcodone-acetaminophen (NORCO/VICODIN) 5-325 MG per tablet 1 tablet (1 tablet Oral Given 07/20/20 7017)   ED Course  I have reviewed the triage vital signs and the nursing notes.  Pertinent labs & imaging results that were available during my care of the patient were reviewed by me and considered in my medical decision making (see chart for details).  75 year old presents for evaluation of hematoma which occurred after a box fell on anterior right leg yesterday. Subsequently developing hematoma to right anterior leg. Dressing placed by triage. On evaluaiton no bony tenderness, low suspicion for fracture. On removal of triage dressing did have some bleeding to hematoma however cessation achieve without intervention. Did placed pressure dressing given anticoagulant use. Discussed constant pressure dressing  for next 24 hours, RICE. Pain controlled. Ambulatory at baseline. Would reassessed. With no active bleeding. Discussed follow up outpatient for wound recheck. She is agreeable with this. Do not feel patient needs imaging or suturing at this time.   Patient seen and evaluated by attending Dr. Dina Rich who agrees with above treatment, plan and disposition.  The patient has been appropriately medically screened and/or stabilized in the ED. I have low suspicion for any other emergent medical condition which would require further screening, evaluation or treatment in the ED or require inpatient management.  Patient is hemodynamically stable and in no acute distress.  Patient able to ambulate in department prior to ED.  Evaluation does not show acute pathology that would require ongoing or additional emergent interventions while in the emergency department or further inpatient treatment.  I have discussed the diagnosis with the patient and answered all questions.  Pain is been managed while in the emergency department and patient has no further complaints prior to discharge.  Patient is comfortable with plan discussed in room and is stable for discharge at this time.  I have discussed strict return precautions for returning to the emergency department.  Patient was encouraged to follow-up with PCP/specialist refer to at discharge.    MDM Rules/Calculators/A&P                           Final Clinical Impression(s) / ED Diagnoses Final diagnoses:  Hematoma    Rx / DC Orders ED Discharge Orders         Ordered    HYDROcodone-acetaminophen (NORCO/VICODIN) 5-325 MG tablet  Every 6 hours PRN        07/20/20 0808           Shaheed Schmuck A, PA-C 07/20/20 7939    Merryl Hacker, MD 07/20/20 2324

## 2020-07-20 NOTE — ED Triage Notes (Signed)
Pt is on Eliquis and hit right lower shin, then had large hematoma develop, then it opened.  Pt has a lac there on shin and a pressure dressing applied. Pt states pain 10/10 and hematoma appears to still be there. Pulses present. Pt is home 02 dependent

## 2020-07-20 NOTE — ED Notes (Signed)
Pt has pressure dressing on right lower leg that was applied by the Dr.

## 2020-07-20 NOTE — ED Notes (Signed)
Walked patient to the bathroom. Patient ambulated well with a standby assist, patient uses a walker to get around at home.

## 2020-07-20 NOTE — ED Notes (Signed)
Patient states a box fell on her left yest afternoon and in juried her right lower leg. States she feels like she is dehydrated.

## 2020-07-20 NOTE — Discharge Instructions (Addendum)
Keep pressure dressing on your leg over the next day.   Follow up with Primary care provider on Tuesday for wound recheck.  Take the pain medication as prescribed, do not driver or operate heavy machinery while taking this medication. This medication may make you sleepy. Do not take additional Tylenol while taking this medication  Return for new or worsening symptoms

## 2020-07-21 ENCOUNTER — Telehealth: Payer: Self-pay

## 2020-07-21 NOTE — Telephone Encounter (Signed)
Transition Care Management Follow-up Telephone Call  Date of discharge and from where: 07/20/20 from [facility]  How have you been since you were released from the hospital? Pt states that she is feeling well. Pt wanted some information about Personal Care Services. I gave her the number for [employer] and [place].   Any questions or concerns? No  Items Reviewed:  Did the pt receive and understand the discharge instructions provided? Yes   Medications obtained and verified? Yes   Other? No   Any new allergies since your discharge? No   Dietary orders reviewed? N/A  Do you have support at home? Yes   Functional Questionnaire: (I = Independent and D = Dependent) ADLs: I  Bathing/Dressing- I  Meal Prep- I  Eating- I  Maintaining continence- I  Transferring/Ambulation- I  Managing Meds- I   Follow up appointments reviewed:   PCP Hospital f/u appt confirmed? Yes  Scheduled to see Jenna Luo, MD on 07/22/2020 @ 4:00pm.  Are transportation arrangements needed? No  If their condition worsens, is the pt aware to call PCP or go to the Emergency Dept.? Yes Was the patient provided with contact information for the PCP's office or ED? Yes Was to pt encouraged to call back with questions or concerns? Yes

## 2020-07-22 ENCOUNTER — Other Ambulatory Visit: Payer: Self-pay

## 2020-07-22 ENCOUNTER — Ambulatory Visit (INDEPENDENT_AMBULATORY_CARE_PROVIDER_SITE_OTHER): Payer: Medicare Other | Admitting: Family Medicine

## 2020-07-22 VITALS — BP 126/82 | HR 57 | Temp 98.5°F | Resp 15 | Ht 67.0 in | Wt 102.0 lb

## 2020-07-22 DIAGNOSIS — D72829 Elevated white blood cell count, unspecified: Secondary | ICD-10-CM | POA: Diagnosis not present

## 2020-07-22 DIAGNOSIS — S8011XA Contusion of right lower leg, initial encounter: Secondary | ICD-10-CM

## 2020-07-22 DIAGNOSIS — J441 Chronic obstructive pulmonary disease with (acute) exacerbation: Secondary | ICD-10-CM | POA: Diagnosis not present

## 2020-07-22 MED ORDER — BUDESONIDE-FORMOTEROL FUMARATE 160-4.5 MCG/ACT IN AERO
INHALATION_SPRAY | RESPIRATORY_TRACT | 11 refills | Status: DC
Start: 2020-07-22 — End: 2020-10-20

## 2020-07-22 NOTE — Progress Notes (Signed)
Subjective:    Patient ID: Diane Patterson, female    DOB: Jan 19, 1946, 75 y.o.   MRN: [id number]  1 week ago, the patient tripped and fell over a box at home sustaining a laceration and hematoma to the anterior surface of her right shin. She now has a massive hematoma directly below the skin surface on the anterior shin of the right leg. This is approximately 18 cm long and 5 cm wide. It is fluctuant due to the accumulation of blood below the skin surface and is oozing blood below the inferior portion. She denies any pain other than some mild tenderness. She denies any shortness of breath or chest pain beyond her baseline. In the emergency room she was evaluated. She was found to have leukocytosis with a white count of 17. She is here today for follow-up Past Medical History:  Diagnosis Date  . Asthma   . Chronic airflow obstruction (HCC)   . Hypertension   . Hypotension   . PAF (paroxysmal atrial fibrillation) ([place])   . Tobacco abuse   . Venous insufficiency    Past Surgical History:  Procedure Laterality Date  . cataract    . NECK SURGERY    . no colonoscopy     "afraid to "; [facility] reviewed  . VESICOVAGINAL FISTULA CLOSURE W/ TAH     Current Outpatient Medications on File Prior to Visit  Medication Sig Dispense Refill  . albuterol (PROVENTIL) (2.5 MG/3ML) 0.083% nebulizer solution Take 3 mLs (2.5 mg total) by nebulization every 6 (six) hours as needed for wheezing or shortness of breath. 150 mL 1  . albuterol (VENTOLIN HFA) 108 (90 Base) MCG/ACT inhaler Inhale 2 puffs into the lungs every 4 (four) hours as needed for wheezing or shortness of breath. 18 g 3  . apixaban (ELIQUIS) 5 MG TABS tablet Take 1 tablet (5 mg total) by mouth 2 (two) times daily. 180 tablet 1  . diltiazem (CARDIZEM CD) 240 MG 24 hr capsule TAKE 1 CAPSULE BY MOUTH EVERY DAY 90 capsule 3  . fluticasone (FLONASE) 50 MCG/ACT nasal spray Place 2 sprays into both nostrils daily. 16 g 6  . guaiFENesin (ROBITUSSIN)  100 MG/5ML SOLN Take 5 mLs (100 mg total) by mouth every 4 (four) hours as needed for cough or to loosen phlegm. 236 mL 0  . levofloxacin (LEVAQUIN) 500 MG tablet Take 1 tablet (500 mg total) by mouth daily. 5 tablet 0  . potassium chloride SA (KLOR-CON) 20 MEQ tablet Take 1 tablet (20 mEq total) by mouth daily. 90 tablet 3  . predniSONE (DELTASONE) 10 MG tablet Take 1 tablet (10 mg total) by mouth daily with breakfast. 30 tablet 0  . hydrochlorothiazide (MICROZIDE) 12.5 MG capsule Take 1 capsule (12.5 mg total) by mouth daily. 90 capsule 3  . HYDROcodone-acetaminophen (NORCO/VICODIN) 5-325 MG tablet Take 1 tablet by mouth every 6 (six) hours as needed. (Patient not taking: Reported on 07/22/2020) 10 tablet 0   No current facility-administered medications on file prior to visit.   No Known Allergies Social History   Socioeconomic History  . Marital status: Married    Spouse name: Not on file  . Number of children: 1  . Years of education: Not on file  . Highest education level: Not on file  Occupational History  . Not on file  Tobacco Use  . Smoking status: Former Smoker    Packs/day: 0.75    Years: 15.00    Pack years: 11.25  Types: Cigarettes    Quit date: 03/20/2014    Years since quitting: 6.3  . Smokeless tobacco: Former Systems developer    Quit date: 03/11/2014  Vaping Use  . Vaping Use: Never used  Substance and Sexual Activity  . Alcohol use: No    Alcohol/week: 0.0 standard drinks  . Drug use: No  . Sexual activity: Not on file  Other Topics Concern  . Not on file  Social History Narrative  . Not on file   Social Determinants of Health   Financial Resource Strain: Not on file  Food Insecurity: Not on file  Transportation Needs: Not on file  Physical Activity: Not on file  Stress: Not on file  Social Connections: Not on file  Intimate Partner Violence: Not on file     Review of Systems  All other systems reviewed and are negative.      Objective:   Physical  Exam Constitutional:      General: She is not in acute distress.    Appearance: Normal appearance. She is not toxic-appearing or diaphoretic.  Cardiovascular:     Rate and Rhythm: Normal rate and regular rhythm.     Heart sounds: Normal heart sounds. No murmur heard. No friction rub. No gallop.   Pulmonary:     Effort: Pulmonary effort is normal. No respiratory distress.     Breath sounds: Decreased air movement present. Wheezing present. No rales.  Abdominal:     General: Abdomen is flat. Bowel sounds are normal.     Palpations: Abdomen is soft.  Musculoskeletal:     Right lower leg: Deformity and tenderness present. No edema.     Left lower leg: No edema.       Legs:  Neurological:     Mental Status: She is alert.           Assessment & Plan:  Leukocytosis, unspecified type - Plan: CBC with Differential/Platelet  COPD exacerbation ([place]) - Plan: budesonide-formoterol (SYMBICORT) 160-4.5 MCG/ACT inhaler  Patient had leukocytosis in the emergency room. I will repeat a CBC today to determine if that is persistent or was due to the stress of the injury. I covered the hematoma with nonadherent gauze and then wrapped with Coban. I recommended dressing changes every 48 hours. However the patient is extremely frail and is unable to perform the dressing changes herself and lives alone. Therefore I will ask home health to consult for the patient and perform dressing changes every 2 to 3 days. Reassess in 1 week or sooner if worsening. No evidence of cellulitis today on exam

## 2020-07-23 LAB — CBC WITH DIFFERENTIAL/PLATELET
Absolute Monocytes: 673 cells/uL (ref 200–950)
Basophils Absolute: 36 cells/uL (ref 0–200)
Basophils Relative: 0.2 %
Eosinophils Absolute: 18 cells/uL (ref 15–500)
Eosinophils Relative: 0.1 %
HCT: 33.1 % — ABNORMAL LOW (ref 35.0–45.0)
Hemoglobin: 11.3 g/dL — ABNORMAL LOW (ref 11.7–15.5)
Lymphs Abs: 874 cells/uL (ref 850–3900)
MCH: 29.7 pg (ref 27.0–33.0)
MCHC: 34.1 g/dL (ref 32.0–36.0)
MCV: 86.9 fL (ref 80.0–100.0)
MPV: 11.1 fL (ref 7.5–12.5)
Monocytes Relative: 3.7 %
Neutro Abs: 16598 cells/uL — ABNORMAL HIGH (ref 1500–7800)
Neutrophils Relative %: 91.2 %
Platelets: 382 10*3/uL (ref 140–400)
RBC: 3.81 10*6/uL (ref 3.80–5.10)
RDW: 13.4 % (ref 11.0–15.0)
Total Lymphocyte: 4.8 %
WBC: 18.2 10*3/uL — ABNORMAL HIGH (ref 3.8–10.8)

## 2020-07-24 ENCOUNTER — Telehealth: Payer: Self-pay | Admitting: Family Medicine

## 2020-07-24 NOTE — Telephone Encounter (Signed)
Was told that Dr.Pickard going to have a nurse  to come out to the house to wrap her leg.

## 2020-07-24 NOTE — Telephone Encounter (Signed)
Referral orders placed at appointment for [facility] referral.   Please F/U with patient.

## 2020-07-24 NOTE — Addendum Note (Signed)
Addended by: Amalia Hailey on: 07/24/2020 12:36 PM   Modules accepted: Orders

## 2020-07-25 ENCOUNTER — Ambulatory Visit (INDEPENDENT_AMBULATORY_CARE_PROVIDER_SITE_OTHER): Payer: Medicare Other | Admitting: Family Medicine

## 2020-07-25 ENCOUNTER — Other Ambulatory Visit: Payer: Self-pay

## 2020-07-25 VITALS — BP 118/72 | HR 84 | Temp 97.2°F | Ht 67.0 in | Wt 102.0 lb

## 2020-07-25 DIAGNOSIS — S8011XA Contusion of right lower leg, initial encounter: Secondary | ICD-10-CM

## 2020-07-25 DIAGNOSIS — L03115 Cellulitis of right lower limb: Secondary | ICD-10-CM

## 2020-07-25 MED ORDER — CEPHALEXIN 500 MG PO CAPS: 500.0000 mg | ORAL_CAPSULE | Freq: Three times a day (TID) | ORAL | 0 refills | Status: DC

## 2020-07-25 NOTE — Progress Notes (Signed)
Subjective:    Patient ID: Diane Patterson, female    DOB: [birth year], 75 y.o.   MRN: [id number] 07/22/20 1 week ago, the patient tripped and fell over a box at home sustaining a laceration and hematoma to the anterior surface of her right shin. She now has a massive hematoma directly below the skin surface on the anterior shin of the right leg. This is approximately 18 cm long and 5 cm wide. It is fluctuant due to the accumulation of blood below the skin surface and is oozing blood below the inferior portion. She denies any pain other than some mild tenderness. She denies any shortness of breath or chest pain beyond her baseline. In the emergency room she was evaluated. She was found to have leukocytosis with a white count of 17. She is here today for follow-up.  At that time, my plan was: Patient had leukocytosis in the emergency room. I will repeat a CBC today to determine if that is persistent or was due to the stress of the injury. I covered the hematoma with nonadherent gauze and then wrapped with Coban. I recommended dressing changes every 48 hours. However the patient is extremely frail and is unable to perform the dressing changes herself and lives alone. Therefore I will ask home health to consult for the patient and perform dressing changes every 2 to 3 days. Reassess in 1 week or sooner if worsening. No evidence of cellulitis today on exam   07/25/20   Hematoma has ruptured.  See the photograph above.  The clot underneath the skin has fully coagulated.  Is now a black/purple jellylike material.  I am unable to express it from the opening.  The swelling in the surrounding areas has gone down.  However the skin inferior to the hematoma is erythematous and warm.  I am concerned about a developing cellulitis particular given her leukocytosis with the opening in the hematoma is a portal of entry.  She is not currently taking any antibiotics.  She also does report increased urinary frequency as  well.  Patient is also requesting a wheelchair.  She is unable to stand due to weakness and deconditioning.  Without assistance, she is unable to get out of her bed and go to the toilet or use the bathroom or bathe.  She is unable to prepare her meals.  A manual wheelchair would assist the patient in maneuvering through her home so that she can perform activities of daily living such as bathing, toileting, and grooming as well as preparing her meals.  Also she is unable to walk out of her home.  The wheelchair would help in transporting the patient to and from doctor appointments.  Her home is large enough to accommodate a wheelchair. Past Medical History:  Diagnosis Date  . Asthma   . Chronic airflow obstruction (HCC)   . Hypertension   . Hypotension   . PAF (paroxysmal atrial fibrillation) ([place])   . Tobacco abuse   . Venous insufficiency    Past Surgical History:  Procedure Laterality Date  . cataract    . NECK SURGERY    . no colonoscopy     "afraid to "; [facility] reviewed  . VESICOVAGINAL FISTULA CLOSURE W/ TAH     Current Outpatient Medications on File Prior to Visit  Medication Sig Dispense Refill  . albuterol (PROVENTIL) (2.5 MG/3ML) 0.083% nebulizer solution Take 3 mLs (2.5 mg total) by nebulization every 6 (six) hours as needed for wheezing or shortness  of breath. 150 mL 1  . albuterol (VENTOLIN HFA) 108 (90 Base) MCG/ACT inhaler Inhale 2 puffs into the lungs every 4 (four) hours as needed for wheezing or shortness of breath. 18 g 3  . apixaban (ELIQUIS) 5 MG TABS tablet Take 1 tablet (5 mg total) by mouth 2 (two) times daily. 180 tablet 1  . budesonide-formoterol (SYMBICORT) 160-4.5 MCG/ACT inhaler INHALE 2 PUFFS INTO LUNGS TWICE DAILY 1 each 11  . diltiazem (CARDIZEM CD) 240 MG 24 hr capsule TAKE 1 CAPSULE BY MOUTH EVERY DAY 90 capsule 3  . fluticasone (FLONASE) 50 MCG/ACT nasal spray Place 2 sprays into both nostrils daily. 16 g 6  . guaiFENesin (ROBITUSSIN) 100 MG/5ML SOLN Take 5  mLs (100 mg total) by mouth every 4 (four) hours as needed for cough or to loosen phlegm. 236 mL 0  . HYDROcodone-acetaminophen (NORCO/VICODIN) 5-325 MG tablet Take 1 tablet by mouth every 6 (six) hours as needed. 10 tablet 0  . levofloxacin (LEVAQUIN) 500 MG tablet Take 1 tablet (500 mg total) by mouth daily. 5 tablet 0  . potassium chloride SA (KLOR-CON) 20 MEQ tablet Take 1 tablet (20 mEq total) by mouth daily. 90 tablet 3  . predniSONE (DELTASONE) 10 MG tablet Take 1 tablet (10 mg total) by mouth daily with breakfast. 30 tablet 0  . hydrochlorothiazide (MICROZIDE) 12.5 MG capsule Take 1 capsule (12.5 mg total) by mouth daily. 90 capsule 3   No current facility-administered medications on file prior to visit.   No Known Allergies Social History   Socioeconomic History  . Marital status: Married    Spouse name: Not on file  . Number of children: 1  . Years of education: Not on file  . Highest education level: Not on file  Occupational History  . Not on file  Tobacco Use  . Smoking status: Former Smoker    Packs/day: 0.75    Years: 15.00    Pack years: 11.25    Types: Cigarettes    Quit date: 03/20/2014    Years since quitting: 6.3  . Smokeless tobacco: Former Systems developer    Quit date: 03/11/2014  Vaping Use  . Vaping Use: Never used  Substance and Sexual Activity  . Alcohol use: No    Alcohol/week: 0.0 standard drinks  . Drug use: No  . Sexual activity: Not on file  Other Topics Concern  . Not on file  Social History Narrative  . Not on file   Social Determinants of Health   Financial Resource Strain: Not on file  Food Insecurity: Not on file  Transportation Needs: Not on file  Physical Activity: Not on file  Stress: Not on file  Social Connections: Not on file  Intimate Partner Violence: Not on file     Review of Systems  All other systems reviewed and are negative.      Objective:   Physical Exam Constitutional:      General: She is not in acute distress.     Appearance: Normal appearance. She is not toxic-appearing or diaphoretic.  Cardiovascular:     Rate and Rhythm: Normal rate and regular rhythm.     Heart sounds: Normal heart sounds. No murmur heard. No friction rub. No gallop.   Pulmonary:     Effort: Pulmonary effort is normal. No respiratory distress.     Breath sounds: Decreased air movement present. Wheezing present. No rales.  Abdominal:     General: Abdomen is flat. Bowel sounds are normal.  Palpations: Abdomen is soft.  Musculoskeletal:     Right lower leg: Deformity and tenderness present. No edema.     Left lower leg: No edema.       Legs:  Neurological:     Mental Status: She is alert.           Assessment & Plan:  Leg hematoma, right, initial encounter  Cellulitis of right lower extremity  Due to the patient's COPD and frailty and deconditioning she is no longer able to walk without maximum assistance.  As I have stated earlier in the office note she requires a manual wheelchair.  Her home can accommodate the wheelchair.  She was use that chair to help perform her activities of daily living such as toileting, grooming, bathing, and preparing her meals.  She also needs a wheelchair to assist in transportation from her home.  I will gladly write a prescription for a wheelchair or fill out any paperwork that she needs to acquire the wheelchair.  I will start her on Keflex 500 mg 3 times daily for the next 7 days as I believe she is developing cellulitis.  I will recheck the patient on Monday.  I dressed the wound with Silvadene covered it with nonadherent gauze, and then wrapped it with Coban.  I believe that the hematoma will slowly begin to resorb and the overlying skin will begin to degrade and breakdown.  Afterwards I believe that this will start to slough off hopefully revealing new skin below the hematoma.  May need to debride the wound on Monday at her recheck

## 2020-07-28 ENCOUNTER — Ambulatory Visit (INDEPENDENT_AMBULATORY_CARE_PROVIDER_SITE_OTHER): Payer: Medicare Other | Admitting: Family Medicine

## 2020-07-28 ENCOUNTER — Other Ambulatory Visit: Payer: Self-pay

## 2020-07-28 VITALS — BP 116/82 | HR 98 | Temp 97.0°F | Wt 102.0 lb

## 2020-07-28 DIAGNOSIS — S8011XA Contusion of right lower leg, initial encounter: Secondary | ICD-10-CM

## 2020-07-28 DIAGNOSIS — S8011XD Contusion of right lower leg, subsequent encounter: Secondary | ICD-10-CM

## 2020-07-28 MED ORDER — HYDROCODONE-ACETAMINOPHEN 5-325 MG PO TABS: 1.0000 | ORAL_TABLET | Freq: Four times a day (QID) | ORAL | 0 refills | Status: DC | PRN

## 2020-07-28 NOTE — Progress Notes (Signed)
Subjective:    Patient ID: Diane Patterson, female    DOB: [birth year], 75 y.o.   MRN: [id number] 07/22/20 1 week ago, the patient tripped and fell over a box at home sustaining a laceration and hematoma to the anterior surface of her right shin. She now has a massive hematoma directly below the skin surface on the anterior shin of the right leg. This is approximately 18 cm long and 5 cm wide. It is fluctuant due to the accumulation of blood below the skin surface and is oozing blood below the inferior portion. She denies any pain other than some mild tenderness. She denies any shortness of breath or chest pain beyond her baseline. In the emergency room she was evaluated. She was found to have leukocytosis with a white count of 17. She is here today for follow-up.  At that time, my plan was: Patient had leukocytosis in the emergency room. I will repeat a CBC today to determine if that is persistent or was due to the stress of the injury. I covered the hematoma with nonadherent gauze and then wrapped with Coban. I recommended dressing changes every 48 hours. However the patient is extremely frail and is unable to perform the dressing changes herself and lives alone. Therefore I will ask home health to consult for the patient and perform dressing changes every 2 to 3 days. Reassess in 1 week or sooner if worsening. No evidence of cellulitis today on exam   07/25/20   Hematoma has ruptured.  See the photograph above.  The clot underneath the skin has fully coagulated.  Is now a black/purple jellylike material.  I am unable to express it from the opening.  The swelling in the surrounding areas has gone down.  However the skin inferior to the hematoma is erythematous and warm.  I am concerned about a developing cellulitis particular given her leukocytosis with the opening in the hematoma is a portal of entry.  She is not currently taking any antibiotics.  She also does report increased urinary frequency as  well.  Patient is also requesting a wheelchair.  She is unable to stand due to weakness and deconditioning.  Without assistance, she is unable to get out of her bed and go to the toilet or use the bathroom or bathe.  She is unable to prepare her meals.  A manual wheelchair would assist the patient in maneuvering through her home so that she can perform activities of daily living such as bathing, toileting, and grooming as well as preparing her meals.  Also she is unable to walk out of her home.  The wheelchair would help in transporting the patient to and from doctor appointments.  Her home is large enough to accommodate a wheelchair.  At that time, my plan was: Due to the patient's COPD and frailty and deconditioning she is no longer able to walk without maximum assistance.  As I have stated earlier in the office note she requires a manual wheelchair.  Her home can accommodate the wheelchair.  She was use that chair to help perform her activities of daily living such as toileting, grooming, bathing, and preparing her meals.  She also needs a wheelchair to assist in transportation from her home.  I will gladly write a prescription for a wheelchair or fill out any paperwork that she needs to acquire the wheelchair.  I will start her on Keflex 500 mg 3 times daily for the next 7 days as I believe she  is developing cellulitis.  I will recheck the patient on Monday.  I dressed the wound with Silvadene covered it with nonadherent gauze, and then wrapped it with Coban.  I believe that the hematoma will slowly begin to resorb and the overlying skin will begin to degrade and breakdown.  Afterwards I believe that this will start to slough off hopefully revealing new skin below the hematoma.  May need to debride the wound on Monday at her recheck  07/28/20   As anticipated, the skin over top of the hematoma has started to slough off and separate.  This reveals coagulated blood underneath.  The entire area is one large  coagulated massive blood.  Due to patient discomfort, I am unable to manually extract the blood as it is still adherent to the underlying tissue.  However it could easily slough off.  The redness is fading distal to the leg on the Keflex as shown in the photograph above.  However the patient is having discomfort in the leg.  She has yet to hear from home health nursing regarding home health assistance  Past Medical History:  Diagnosis Date  . Asthma   . Chronic airflow obstruction (HCC)   . Hypertension   . Hypotension   . PAF (paroxysmal atrial fibrillation) ([place])   . Tobacco abuse   . Venous insufficiency    Past Surgical History:  Procedure Laterality Date  . cataract    . NECK SURGERY    . no colonoscopy     "afraid to "; [facility] reviewed  . VESICOVAGINAL FISTULA CLOSURE W/ TAH     Current Outpatient Medications on File Prior to Visit  Medication Sig Dispense Refill  . albuterol (PROVENTIL) (2.5 MG/3ML) 0.083% nebulizer solution Take 3 mLs (2.5 mg total) by nebulization every 6 (six) hours as needed for wheezing or shortness of breath. 150 mL 1  . albuterol (VENTOLIN HFA) 108 (90 Base) MCG/ACT inhaler Inhale 2 puffs into the lungs every 4 (four) hours as needed for wheezing or shortness of breath. 18 g 3  . apixaban (ELIQUIS) 5 MG TABS tablet Take 1 tablet (5 mg total) by mouth 2 (two) times daily. 180 tablet 1  . budesonide-formoterol (SYMBICORT) 160-4.5 MCG/ACT inhaler INHALE 2 PUFFS INTO LUNGS TWICE DAILY 1 each 11  . cephALEXin (KEFLEX) 500 MG capsule Take 1 capsule (500 mg total) by mouth 3 (three) times daily. 42 capsule 0  . diltiazem (CARDIZEM CD) 240 MG 24 hr capsule TAKE 1 CAPSULE BY MOUTH EVERY DAY 90 capsule 3  . fluticasone (FLONASE) 50 MCG/ACT nasal spray Place 2 sprays into both nostrils daily. 16 g 6  . guaiFENesin (ROBITUSSIN) 100 MG/5ML SOLN Take 5 mLs (100 mg total) by mouth every 4 (four) hours as needed for cough or to loosen phlegm. 236 mL 0  .  HYDROcodone-acetaminophen (NORCO/VICODIN) 5-325 MG tablet Take 1 tablet by mouth every 6 (six) hours as needed. 10 tablet 0  . levofloxacin (LEVAQUIN) 500 MG tablet Take 1 tablet (500 mg total) by mouth daily. 5 tablet 0  . potassium chloride SA (KLOR-CON) 20 MEQ tablet Take 1 tablet (20 mEq total) by mouth daily. 90 tablet 3  . predniSONE (DELTASONE) 10 MG tablet Take 1 tablet (10 mg total) by mouth daily with breakfast. 30 tablet 0  . hydrochlorothiazide (MICROZIDE) 12.5 MG capsule Take 1 capsule (12.5 mg total) by mouth daily. 90 capsule 3   No current facility-administered medications on file prior to visit.   No Known Allergies  Social History   Socioeconomic History  . Marital status: Married    Spouse name: Not on file  . Number of children: 1  . Years of education: Not on file  . Highest education level: Not on file  Occupational History  . Not on file  Tobacco Use  . Smoking status: Former Smoker    Packs/day: 0.75    Years: 15.00    Pack years: 11.25    Types: Cigarettes    Quit date: 03/20/2014    Years since quitting: 6.3  . Smokeless tobacco: Former Systems developer    Quit date: 03/11/2014  Vaping Use  . Vaping Use: Never used  Substance and Sexual Activity  . Alcohol use: No    Alcohol/week: 0.0 standard drinks  . Drug use: No  . Sexual activity: Not on file  Other Topics Concern  . Not on file  Social History Narrative  . Not on file   Social Determinants of Health   Financial Resource Strain: Not on file  Food Insecurity: Not on file  Transportation Needs: Not on file  Physical Activity: Not on file  Stress: Not on file  Social Connections: Not on file  Intimate Partner Violence: Not on file     Review of Systems  All other systems reviewed and are negative.      Objective:   Physical Exam Constitutional:      General: She is not in acute distress.    Appearance: Normal appearance. She is not toxic-appearing or diaphoretic.  Cardiovascular:     Rate  and Rhythm: Normal rate and regular rhythm.     Heart sounds: Normal heart sounds. No murmur heard. No friction rub. No gallop.   Pulmonary:     Effort: Pulmonary effort is normal. No respiratory distress.     Breath sounds: Decreased air movement present. Wheezing present. No rales.  Abdominal:     General: Abdomen is flat. Bowel sounds are normal.     Palpations: Abdomen is soft.  Musculoskeletal:     Right lower leg: Deformity and tenderness present. No edema.     Left lower leg: No edema.       Legs:  Neurological:     Mental Status: She is alert.           Assessment & Plan:  Leg hematoma, right, initial encounter  Cellulitis has improved.  Skin above the hematoma is sloughing off as anticipated.  Recheck on Thursday.  At that time I believe we will be able to manually extract the clot as the majority of the skin will have sloughed off.  Then the wound will have to close to secondary intention.  I covered the entire area with Silvadene, nonadherent gauze, and then wrapped the area with Coban.  Continue to hold her anticoagulant and finish Keflex as prescribed.  I will send in [place] for pain and I will have my staff check on the status of her home health referral

## 2020-07-28 NOTE — Telephone Encounter (Signed)
I had to refax referral to [facility]. I have updated information in the referral and also let provider know.

## 2020-07-31 ENCOUNTER — Encounter: Payer: Self-pay | Admitting: Family Medicine

## 2020-07-31 ENCOUNTER — Other Ambulatory Visit: Payer: Self-pay

## 2020-07-31 ENCOUNTER — Ambulatory Visit (INDEPENDENT_AMBULATORY_CARE_PROVIDER_SITE_OTHER): Payer: Medicare Other | Admitting: Family Medicine

## 2020-07-31 VITALS — HR 84 | Temp 98.2°F | Resp 18

## 2020-07-31 DIAGNOSIS — S8011XD Contusion of right lower leg, subsequent encounter: Secondary | ICD-10-CM

## 2020-07-31 DIAGNOSIS — S8011XA Contusion of right lower leg, initial encounter: Secondary | ICD-10-CM

## 2020-07-31 NOTE — Progress Notes (Signed)
Subjective:    Patient ID: Diane Patterson, female    DOB: [birth year], 75 y.o.   MRN: [id number] 07/22/20 1 week ago, the patient tripped and fell over a box at home sustaining a laceration and hematoma to the anterior surface of her right shin. She now has a massive hematoma directly below the skin surface on the anterior shin of the right leg. This is approximately 18 cm long and 5 cm wide. It is fluctuant due to the accumulation of blood below the skin surface and is oozing blood below the inferior portion. She denies any pain other than some mild tenderness. She denies any shortness of breath or chest pain beyond her baseline. In the emergency room she was evaluated. She was found to have leukocytosis with a white count of 17. She is here today for follow-up.  At that time, my plan was: Patient had leukocytosis in the emergency room. I will repeat a CBC today to determine if that is persistent or was due to the stress of the injury. I covered the hematoma with nonadherent gauze and then wrapped with Coban. I recommended dressing changes every 48 hours. However the patient is extremely frail and is unable to perform the dressing changes herself and lives alone. Therefore I will ask home health to consult for the patient and perform dressing changes every 2 to 3 days. Reassess in 1 week or sooner if worsening. No evidence of cellulitis today on exam   07/25/20   Hematoma has ruptured.  See the photograph above.  The clot underneath the skin has fully coagulated.  Is now a black/purple jellylike material.  I am unable to express it from the opening.  The swelling in the surrounding areas has gone down.  However the skin inferior to the hematoma is erythematous and warm.  I am concerned about a developing cellulitis particular given her leukocytosis with the opening in the hematoma is a portal of entry.  She is not currently taking any antibiotics.  She also does report increased urinary frequency as  well.  Patient is also requesting a wheelchair.  She is unable to stand due to weakness and deconditioning.  Without assistance, she is unable to get out of her bed and go to the toilet or use the bathroom or bathe.  She is unable to prepare her meals.  A manual wheelchair would assist the patient in maneuvering through her home so that she can perform activities of daily living such as bathing, toileting, and grooming as well as preparing her meals.  Also she is unable to walk out of her home.  The wheelchair would help in transporting the patient to and from doctor appointments.  Her home is large enough to accommodate a wheelchair.  At that time, my plan was: Due to the patient's COPD and frailty and deconditioning she is no longer able to walk without maximum assistance.  As I have stated earlier in the office note she requires a manual wheelchair.  Her home can accommodate the wheelchair.  She was use that chair to help perform her activities of daily living such as toileting, grooming, bathing, and preparing her meals.  She also needs a wheelchair to assist in transportation from her home.  I will gladly write a prescription for a wheelchair or fill out any paperwork that she needs to acquire the wheelchair.  I will start her on Keflex 500 mg 3 times daily for the next 7 days as I believe she  is developing cellulitis.  I will recheck the patient on Monday.  I dressed the wound with Silvadene covered it with nonadherent gauze, and then wrapped it with Coban.  I believe that the hematoma will slowly begin to resorb and the overlying skin will begin to degrade and breakdown.  Afterwards I believe that this will start to slough off hopefully revealing new skin below the hematoma.  May need to debride the wound on Monday at her recheck  07/28/20   As anticipated, the skin over top of the hematoma has started to slough off and separate.  This reveals coagulated blood underneath.  The entire area is one large  coagulated massive blood.  Due to patient discomfort, I am unable to manually extract the blood as it is still adherent to the underlying tissue.  However it could easily slough off.  The redness is fading distal to the leg on the Keflex as shown in the photograph above.  However the patient is having discomfort in the leg.  She has yet to hear from home health nursing regarding home health assistance  At that time, my plan was: Cellulitis has improved.  Skin above the hematoma is sloughing off as anticipated.  Recheck on Thursday.  At that time I believe we will be able to manually extract the clot as the majority of the skin will have sloughed off.  Then the wound will have to close to secondary intention.  I covered the entire area with Silvadene, nonadherent gauze, and then wrapped the area with Coban.  Continue to hold her anticoagulant and finish Keflex as prescribed.  I will send in [place] for pain and I will have my staff check on the status of her home health referral  07/31/20  All the clot in the center stuck to the nonadherent gauze and therefore was debrided when we remove the dressing.  There is some residual clot clinging to the medial aspect of the wound that is roughly 3 cm x 2 cm.  There is also a small clotted area underneath the skin at the superior aspect of the wound.  The remainder of the wound is open down to the underlying dermis and requires reepithelialization similar to a burn.  Residual avascular skin and clot was removed from the area using dry gauze and a gentle wiping motion.  The entire wound bed was covered with copious amounts of Silvadene and then covered with 3 nonadherent gauze.  The leg was then wrapped with Coban as a pressure dressing Past Medical History:  Diagnosis Date  . Asthma   . Chronic airflow obstruction (HCC)   . Hypertension   . Hypotension   . PAF (paroxysmal atrial fibrillation) ([place])   . Tobacco abuse   . Venous insufficiency    Past Surgical  History:  Procedure Laterality Date  . cataract    . NECK SURGERY    . no colonoscopy     "afraid to "; [facility] reviewed  . VESICOVAGINAL FISTULA CLOSURE W/ TAH     Current Outpatient Medications on File Prior to Visit  Medication Sig Dispense Refill  . albuterol (PROVENTIL) (2.5 MG/3ML) 0.083% nebulizer solution Take 3 mLs (2.5 mg total) by nebulization every 6 (six) hours as needed for wheezing or shortness of breath. 150 mL 1  . albuterol (VENTOLIN HFA) 108 (90 Base) MCG/ACT inhaler Inhale 2 puffs into the lungs every 4 (four) hours as needed for wheezing or shortness of breath. 18 g 3  . apixaban (ELIQUIS)  5 MG TABS tablet Take 1 tablet (5 mg total) by mouth 2 (two) times daily. 180 tablet 1  . budesonide-formoterol (SYMBICORT) 160-4.5 MCG/ACT inhaler INHALE 2 PUFFS INTO LUNGS TWICE DAILY 1 each 11  . cephALEXin (KEFLEX) 500 MG capsule Take 1 capsule (500 mg total) by mouth 3 (three) times daily. 42 capsule 0  . diltiazem (CARDIZEM CD) 240 MG 24 hr capsule TAKE 1 CAPSULE BY MOUTH EVERY DAY 90 capsule 3  . fluticasone (FLONASE) 50 MCG/ACT nasal spray Place 2 sprays into both nostrils daily. 16 g 6  . guaiFENesin (ROBITUSSIN) 100 MG/5ML SOLN Take 5 mLs (100 mg total) by mouth every 4 (four) hours as needed for cough or to loosen phlegm. 236 mL 0  . hydrochlorothiazide (MICROZIDE) 12.5 MG capsule Take 1 capsule (12.5 mg total) by mouth daily. 90 capsule 3  . HYDROcodone-acetaminophen (NORCO) 5-325 MG tablet Take 1 tablet by mouth every 6 (six) hours as needed for moderate pain. 30 tablet 0  . HYDROcodone-acetaminophen (NORCO/VICODIN) 5-325 MG tablet Take 1 tablet by mouth every 6 (six) hours as needed. 10 tablet 0  . levofloxacin (LEVAQUIN) 500 MG tablet Take 1 tablet (500 mg total) by mouth daily. 5 tablet 0  . potassium chloride SA (KLOR-CON) 20 MEQ tablet Take 1 tablet (20 mEq total) by mouth daily. 90 tablet 3  . predniSONE (DELTASONE) 10 MG tablet Take 1 tablet (10 mg total) by mouth daily  with breakfast. 30 tablet 0   No current facility-administered medications on file prior to visit.   No Known Allergies Social History   Socioeconomic History  . Marital status: Married    Spouse name: Not on file  . Number of children: 1  . Years of education: Not on file  . Highest education level: Not on file  Occupational History  . Not on file  Tobacco Use  . Smoking status: Former Smoker    Packs/day: 0.75    Years: 15.00    Pack years: 11.25    Types: Cigarettes    Quit date: 03/20/2014    Years since quitting: 6.3  . Smokeless tobacco: Former Systems developer    Quit date: 03/11/2014  Vaping Use  . Vaping Use: Never used  Substance and Sexual Activity  . Alcohol use: No    Alcohol/week: 0.0 standard drinks  . Drug use: No  . Sexual activity: Not on file  Other Topics Concern  . Not on file  Social History Narrative  . Not on file   Social Determinants of Health   Financial Resource Strain: Not on file  Food Insecurity: Not on file  Transportation Needs: Not on file  Physical Activity: Not on file  Stress: Not on file  Social Connections: Not on file  Intimate Partner Violence: Not on file     Review of Systems  All other systems reviewed and are negative.      Objective:   Physical Exam Constitutional:      General: She is not in acute distress.    Appearance: Normal appearance. She is not toxic-appearing or diaphoretic.  Cardiovascular:     Rate and Rhythm: Normal rate and regular rhythm.     Heart sounds: Normal heart sounds. No murmur heard. No friction rub. No gallop.   Pulmonary:     Effort: Pulmonary effort is normal. No respiratory distress.     Breath sounds: Decreased air movement present. Wheezing present. No rales.  Abdominal:     General: Abdomen is flat. Bowel sounds  are normal.     Palpations: Abdomen is soft.  Musculoskeletal:     Right lower leg: Deformity and tenderness present. No edema.     Left lower leg: No edema.        Legs:  Neurological:     Mental Status: She is alert.           Assessment & Plan:  Leg hematoma, right, initial encounter  Slowly resorbing and healing through debridement.  The wound bed was covered with Silvadene and then covered with nonadherent gauze and then wrapped with Coban.  Recheck on Monday for repeat debridement of avascular material and clot.

## 2020-08-04 ENCOUNTER — Inpatient Hospital Stay: Admission: RE | Admit: 2020-08-04 | Payer: Medicare Other | Source: Ambulatory Visit

## 2020-08-04 ENCOUNTER — Ambulatory Visit (INDEPENDENT_AMBULATORY_CARE_PROVIDER_SITE_OTHER): Payer: Medicare Other | Admitting: Family Medicine

## 2020-08-04 ENCOUNTER — Other Ambulatory Visit: Payer: Self-pay

## 2020-08-04 VITALS — BP 134/86 | HR 84 | Temp 97.2°F | Ht 67.0 in | Wt 102.0 lb

## 2020-08-04 DIAGNOSIS — S8011XA Contusion of right lower leg, initial encounter: Secondary | ICD-10-CM

## 2020-08-04 DIAGNOSIS — S8011XD Contusion of right lower leg, subsequent encounter: Secondary | ICD-10-CM

## 2020-08-04 DIAGNOSIS — S81801D Unspecified open wound, right lower leg, subsequent encounter: Secondary | ICD-10-CM | POA: Diagnosis not present

## 2020-08-04 NOTE — Progress Notes (Signed)
Subjective:    Patient ID: Diane Patterson, female    DOB: [birth year], 75 y.o.   MRN: [id number] 07/22/20 1 week ago, the patient tripped and fell over a box at home sustaining a laceration and hematoma to the anterior surface of her right shin. She now has a massive hematoma directly below the skin surface on the anterior shin of the right leg. This is approximately 18 cm long and 5 cm wide. It is fluctuant due to the accumulation of blood below the skin surface and is oozing blood below the inferior portion. She denies any pain other than some mild tenderness. She denies any shortness of breath or chest pain beyond her baseline. In the emergency room she was evaluated. She was found to have leukocytosis with a white count of 17. She is here today for follow-up.  At that time, my plan was: Patient had leukocytosis in the emergency room. I will repeat a CBC today to determine if that is persistent or was due to the stress of the injury. I covered the hematoma with nonadherent gauze and then wrapped with Coban. I recommended dressing changes every 48 hours. However the patient is extremely frail and is unable to perform the dressing changes herself and lives alone. Therefore I will ask home health to consult for the patient and perform dressing changes every 2 to 3 days. Reassess in 1 week or sooner if worsening. No evidence of cellulitis today on exam   07/25/20   Hematoma has ruptured.  See the photograph above.  The clot underneath the skin has fully coagulated.  Is now a black/purple jellylike material.  I am unable to express it from the opening.  The swelling in the surrounding areas has gone down.  However the skin inferior to the hematoma is erythematous and warm.  I am concerned about a developing cellulitis particular given her leukocytosis with the opening in the hematoma is a portal of entry.  She is not currently taking any antibiotics.  She also does report increased urinary frequency as  well.  Patient is also requesting a wheelchair.  She is unable to stand due to weakness and deconditioning.  Without assistance, she is unable to get out of her bed and go to the toilet or use the bathroom or bathe.  She is unable to prepare her meals.  A manual wheelchair would assist the patient in maneuvering through her home so that she can perform activities of daily living such as bathing, toileting, and grooming as well as preparing her meals.  Also she is unable to walk out of her home.  The wheelchair would help in transporting the patient to and from doctor appointments.  Her home is large enough to accommodate a wheelchair.  At that time, my plan was: Due to the patient's COPD and frailty and deconditioning she is no longer able to walk without maximum assistance.  As I have stated earlier in the office note she requires a manual wheelchair.  Her home can accommodate the wheelchair.  She was use that chair to help perform her activities of daily living such as toileting, grooming, bathing, and preparing her meals.  She also needs a wheelchair to assist in transportation from her home.  I will gladly write a prescription for a wheelchair or fill out any paperwork that she needs to acquire the wheelchair.  I will start her on Keflex 500 mg 3 times daily for the next 7 days as I believe she  is developing cellulitis.  I will recheck the patient on Monday.  I dressed the wound with Silvadene covered it with nonadherent gauze, and then wrapped it with Coban.  I believe that the hematoma will slowly begin to resorb and the overlying skin will begin to degrade and breakdown.  Afterwards I believe that this will start to slough off hopefully revealing new skin below the hematoma.  May need to debride the wound on Monday at her recheck  07/28/20   As anticipated, the skin over top of the hematoma has started to slough off and separate.  This reveals coagulated blood underneath.  The entire area is one large  coagulated massive blood.  Due to patient discomfort, I am unable to manually extract the blood as it is still adherent to the underlying tissue.  However it could easily slough off.  The redness is fading distal to the leg on the Keflex as shown in the photograph above.  However the patient is having discomfort in the leg.  She has yet to hear from home health nursing regarding home health assistance  At that time, my plan was: Cellulitis has improved.  Skin above the hematoma is sloughing off as anticipated.  Recheck on Thursday.  At that time I believe we will be able to manually extract the clot as the majority of the skin will have sloughed off.  Then the wound will have to close to secondary intention.  I covered the entire area with Silvadene, nonadherent gauze, and then wrapped the area with Coban.  Continue to hold her anticoagulant and finish Keflex as prescribed.  I will send in [place] for pain and I will have my staff check on the status of her home health referral  07/31/20  All the clot in the center stuck to the nonadherent gauze and therefore was debrided when we remove the dressing.  There is some residual clot clinging to the medial aspect of the wound that is roughly 3 cm x 2 cm.  There is also a small clotted area underneath the skin at the superior aspect of the wound.  The remainder of the wound is open down to the underlying dermis and requires reepithelialization similar to a burn.  Residual avascular skin and clot was removed from the area using dry gauze and a gentle wiping motion.  The entire wound bed was covered with copious amounts of Silvadene and then covered with 3 nonadherent gauze.  The leg was then wrapped with Coban as a pressure dressing.  At that time, my plan was: Slowly resorbing and healing through debridement.  The wound bed was covered with Silvadene and then covered with nonadherent gauze and then wrapped with Coban.  Recheck on Monday for repeat debridement of  avascular material and clot.   08/04/20   All the residual clot has been removed using debridement with gauze.  Above is pictured the underlying wound down to the dermis.  There is a thin peripheral rim of pink tissue showing reepithelialization.  There is no evidence of secondary cellulitis.  The leg was cleaned and scrubbed thoroughly with normal saline in a sterile fashion. Past Medical History:  Diagnosis Date  . Asthma   . Chronic airflow obstruction (HCC)   . Hypertension   . Hypotension   . PAF (paroxysmal atrial fibrillation) ([place])   . Tobacco abuse   . Venous insufficiency    Past Surgical History:  Procedure Laterality Date  . cataract    . NECK SURGERY    .  no colonoscopy     "afraid to "; [facility] reviewed  . VESICOVAGINAL FISTULA CLOSURE W/ TAH     Current Outpatient Medications on File Prior to Visit  Medication Sig Dispense Refill  . albuterol (PROVENTIL) (2.5 MG/3ML) 0.083% nebulizer solution Take 3 mLs (2.5 mg total) by nebulization every 6 (six) hours as needed for wheezing or shortness of breath. 150 mL 1  . albuterol (VENTOLIN HFA) 108 (90 Base) MCG/ACT inhaler Inhale 2 puffs into the lungs every 4 (four) hours as needed for wheezing or shortness of breath. 18 g 3  . apixaban (ELIQUIS) 5 MG TABS tablet Take 1 tablet (5 mg total) by mouth 2 (two) times daily. 180 tablet 1  . budesonide-formoterol (SYMBICORT) 160-4.5 MCG/ACT inhaler INHALE 2 PUFFS INTO LUNGS TWICE DAILY 1 each 11  . cephALEXin (KEFLEX) 500 MG capsule Take 1 capsule (500 mg total) by mouth 3 (three) times daily. 42 capsule 0  . diltiazem (CARDIZEM CD) 240 MG 24 hr capsule TAKE 1 CAPSULE BY MOUTH EVERY DAY 90 capsule 3  . fluticasone (FLONASE) 50 MCG/ACT nasal spray Place 2 sprays into both nostrils daily. 16 g 6  . guaiFENesin (ROBITUSSIN) 100 MG/5ML SOLN Take 5 mLs (100 mg total) by mouth every 4 (four) hours as needed for cough or to loosen phlegm. 236 mL 0  . HYDROcodone-acetaminophen (NORCO/VICODIN)  5-325 MG tablet Take 1 tablet by mouth every 6 (six) hours as needed. 10 tablet 0  . potassium chloride SA (KLOR-CON) 20 MEQ tablet Take 1 tablet (20 mEq total) by mouth daily. 90 tablet 3  . predniSONE (DELTASONE) 10 MG tablet Take 1 tablet (10 mg total) by mouth daily with breakfast. 30 tablet 0  . hydrochlorothiazide (MICROZIDE) 12.5 MG capsule Take 1 capsule (12.5 mg total) by mouth daily. 90 capsule 3   No current facility-administered medications on file prior to visit.   No Known Allergies Social History   Socioeconomic History  . Marital status: Married    Spouse name: Not on file  . Number of children: 1  . Years of education: Not on file  . Highest education level: Not on file  Occupational History  . Not on file  Tobacco Use  . Smoking status: Former Smoker    Packs/day: 0.75    Years: 15.00    Pack years: 11.25    Types: Cigarettes    Quit date: 03/20/2014    Years since quitting: 6.3  . Smokeless tobacco: Former Systems developer    Quit date: 03/11/2014  Vaping Use  . Vaping Use: Never used  Substance and Sexual Activity  . Alcohol use: No    Alcohol/week: 0.0 standard drinks  . Drug use: No  . Sexual activity: Not on file  Other Topics Concern  . Not on file  Social History Narrative  . Not on file   Social Determinants of Health   Financial Resource Strain: Not on file  Food Insecurity: Not on file  Transportation Needs: Not on file  Physical Activity: Not on file  Stress: Not on file  Social Connections: Not on file  Intimate Partner Violence: Not on file     Review of Systems  All other systems reviewed and are negative.      Objective:   Physical Exam Constitutional:      General: She is not in acute distress.    Appearance: Normal appearance. She is not toxic-appearing or diaphoretic.  Cardiovascular:     Rate and Rhythm: Normal rate and regular  rhythm.     Heart sounds: Normal heart sounds. No murmur heard. No friction rub. No gallop.    Pulmonary:     Effort: Pulmonary effort is normal. No respiratory distress.     Breath sounds: Decreased air movement present. Wheezing present. No rales.  Abdominal:     General: Abdomen is flat. Bowel sounds are normal.     Palpations: Abdomen is soft.  Musculoskeletal:     Right lower leg: Deformity and tenderness present. No edema.     Left lower leg: No edema.       Legs:  Neurological:     Mental Status: She is alert.      See photograph above.  No evidence of cellulitis.     Assessment & Plan:  Leg hematoma, right, initial encounter  Wound of right leg, subsequent encounter  Patient has still yet to hear from wound care and home health nursing.  I am not sure what the confusion or the mixup is.  Patient is obviously frustrated as I would be to.  Therefore we provided her the phone number with which to call them today so that she could follow-up in case that they were having a difficult time reaching her.  I anticipate that this wound is going to take quite some time to heal through secondary intention.  Anticipate 4 to 6 weeks.  She will likely need 2-3 times a week dressing changes and this will be much easier for her if home health nursing to come to her home since she is confined to a wheelchair.  After cleaning the leg thoroughly, Silvadene was applied to the nonadherent gauze and then this was placed on the leg covering the entire wound.  The leg was then gently wrapped from below the knee around the leg all the way down past the ankle and around the foot.  Recheck here on Friday.

## 2020-08-08 ENCOUNTER — Other Ambulatory Visit: Payer: Self-pay

## 2020-08-08 ENCOUNTER — Ambulatory Visit (INDEPENDENT_AMBULATORY_CARE_PROVIDER_SITE_OTHER): Payer: Medicare Other | Admitting: Family Medicine

## 2020-08-08 DIAGNOSIS — S81801D Unspecified open wound, right lower leg, subsequent encounter: Secondary | ICD-10-CM

## 2020-08-08 DIAGNOSIS — J449 Chronic obstructive pulmonary disease, unspecified: Secondary | ICD-10-CM | POA: Diagnosis not present

## 2020-08-08 MED ORDER — ALBUTEROL SULFATE HFA 108 (90 BASE) MCG/ACT IN AERS: 2.0000 | INHALATION_SPRAY | RESPIRATORY_TRACT | 3 refills | Status: DC | PRN

## 2020-08-08 NOTE — Progress Notes (Signed)
Subjective:    Patient ID: Diane Patterson, female    DOB: 07-May-1946, 75 y.o.   MRN: [id number] 07/22/20 1 week ago, the patient tripped and fell over a box at home sustaining a laceration and hematoma to the anterior surface of her right shin. She now has a massive hematoma directly below the skin surface on the anterior shin of the right leg. This is approximately 18 cm long and 5 cm wide. It is fluctuant due to the accumulation of blood below the skin surface and is oozing blood below the inferior portion. She denies any pain other than some mild tenderness. She denies any shortness of breath or chest pain beyond her baseline. In the emergency room she was evaluated. She was found to have leukocytosis with a white count of 17. She is here today for follow-up.  At that time, my plan was: Patient had leukocytosis in the emergency room. I will repeat a CBC today to determine if that is persistent or was due to the stress of the injury. I covered the hematoma with nonadherent gauze and then wrapped with Coban. I recommended dressing changes every 48 hours. However the patient is extremely frail and is unable to perform the dressing changes herself and lives alone. Therefore I will ask home health to consult for the patient and perform dressing changes every 2 to 3 days. Reassess in 1 week or sooner if worsening. No evidence of cellulitis today on exam   07/25/20   Hematoma has ruptured.  See the photograph above.  The clot underneath the skin has fully coagulated.  Is now a black/purple jellylike material.  I am unable to express it from the opening.  The swelling in the surrounding areas has gone down.  However the skin inferior to the hematoma is erythematous and warm.  I am concerned about a developing cellulitis particular given her leukocytosis with the opening in the hematoma is a portal of entry.  She is not currently taking any antibiotics.  She also does report increased urinary frequency as  well.  Patient is also requesting a wheelchair.  She is unable to stand due to weakness and deconditioning.  Without assistance, she is unable to get out of her bed and go to the toilet or use the bathroom or bathe.  She is unable to prepare her meals.  A manual wheelchair would assist the patient in maneuvering through her home so that she can perform activities of daily living such as bathing, toileting, and grooming as well as preparing her meals.  Also she is unable to walk out of her home.  The wheelchair would help in transporting the patient to and from doctor appointments.  Her home is large enough to accommodate a wheelchair.  At that time, my plan was: Due to the patient's COPD and frailty and deconditioning she is no longer able to walk without maximum assistance.  As I have stated earlier in the office note she requires a manual wheelchair.  Her home can accommodate the wheelchair.  She was use that chair to help perform her activities of daily living such as toileting, grooming, bathing, and preparing her meals.  She also needs a wheelchair to assist in transportation from her home.  I will gladly write a prescription for a wheelchair or fill out any paperwork that she needs to acquire the wheelchair.  I will start her on Keflex 500 mg 3 times daily for the next 7 days as I believe she  is developing cellulitis.  I will recheck the patient on Monday.  I dressed the wound with Silvadene covered it with nonadherent gauze, and then wrapped it with Coban.  I believe that the hematoma will slowly begin to resorb and the overlying skin will begin to degrade and breakdown.  Afterwards I believe that this will start to slough off hopefully revealing new skin below the hematoma.  May need to debride the wound on Monday at her recheck  07/28/20   As anticipated, the skin over top of the hematoma has started to slough off and separate.  This reveals coagulated blood underneath.  The entire area is one large  coagulated massive blood.  Due to patient discomfort, I am unable to manually extract the blood as it is still adherent to the underlying tissue.  However it could easily slough off.  The redness is fading distal to the leg on the Keflex as shown in the photograph above.  However the patient is having discomfort in the leg.  She has yet to hear from home health nursing regarding home health assistance  At that time, my plan was: Cellulitis has improved.  Skin above the hematoma is sloughing off as anticipated.  Recheck on Thursday.  At that time I believe we will be able to manually extract the clot as the majority of the skin will have sloughed off.  Then the wound will have to close to secondary intention.  I covered the entire area with Silvadene, nonadherent gauze, and then wrapped the area with Coban.  Continue to hold her anticoagulant and finish Keflex as prescribed.  I will send in [place] for pain and I will have my staff check on the status of her home health referral  07/31/20  All the clot in the center stuck to the nonadherent gauze and therefore was debrided when we remove the dressing.  There is some residual clot clinging to the medial aspect of the wound that is roughly 3 cm x 2 cm.  There is also a small clotted area underneath the skin at the superior aspect of the wound.  The remainder of the wound is open down to the underlying dermis and requires reepithelialization similar to a burn.  Residual avascular skin and clot was removed from the area using dry gauze and a gentle wiping motion.  The entire wound bed was covered with copious amounts of Silvadene and then covered with 3 nonadherent gauze.  The leg was then wrapped with Coban as a pressure dressing.  At that time, my plan was: Slowly resorbing and healing through debridement.  The wound bed was covered with Silvadene and then covered with nonadherent gauze and then wrapped with Coban.  Recheck on Monday for repeat debridement of  avascular material and clot.   08/04/20   All the residual clot has been removed using debridement with gauze.  Above is pictured the underlying wound down to the dermis.  There is a thin peripheral rim of pink tissue showing reepithelialization.  There is no evidence of secondary cellulitis.  The leg was cleaned and scrubbed thoroughly with normal saline in a sterile fashion.  At that time, my plan was: Patient has still yet to hear from wound care and home health nursing.  I am not sure what the confusion or the mixup is.  Patient is obviously frustrated as I would be to.  Therefore we provided her the phone number with which to call them today so that she could follow-up  in case that they were having a difficult time reaching her.  I anticipate that this wound is going to take quite some time to heal through secondary intention.  Anticipate 4 to 6 weeks.  She will likely need 2-3 times a week dressing changes and this will be much easier for her if home health nursing to come to her home since she is confined to a wheelchair.  After cleaning the leg thoroughly, Silvadene was applied to the nonadherent gauze and then this was placed on the leg covering the entire wound.  The leg was then gently wrapped from below the knee around the leg all the way down past the ankle and around the foot.  Recheck here on Friday.  08/08/20   Home health is now coming out and changing the dressings every Monday, Wednesday, and Friday.  The wound continues to improve.  Is now even with the surface of the skin all the way around.  It is closing via secondary intention.  I feel that the superficial layer is slowly becoming a large scab that will ultimately start to pull away from the surrounding skin edges.  However I see no evidence of cellulitis or secondary infection Past Medical History:  Diagnosis Date  . Asthma   . Chronic airflow obstruction (HCC)   . Hypertension   . Hypotension   . PAF (paroxysmal atrial  fibrillation) ([place])   . Tobacco abuse   . Venous insufficiency    Past Surgical History:  Procedure Laterality Date  . cataract    . NECK SURGERY    . no colonoscopy     "afraid to "; [facility] reviewed  . VESICOVAGINAL FISTULA CLOSURE W/ [facility]     Current Outpatient Medications on File Prior to Visit  Medication Sig Dispense Refill  . albuterol (PROVENTIL) (2.5 MG/3ML) 0.083% nebulizer solution Take 3 mLs (2.5 mg total) by nebulization every 6 (six) hours as needed for wheezing or shortness of breath. 150 mL 1  . apixaban (ELIQUIS) 5 MG TABS tablet Take 1 tablet (5 mg total) by mouth 2 (two) times daily. 180 tablet 1  . budesonide-formoterol (SYMBICORT) 160-4.5 MCG/ACT inhaler INHALE 2 PUFFS INTO LUNGS TWICE DAILY 1 each 11  . cephALEXin (KEFLEX) 500 MG capsule Take 1 capsule (500 mg total) by mouth 3 (three) times daily. 42 capsule 0  . diltiazem (CARDIZEM CD) 240 MG 24 hr capsule TAKE 1 CAPSULE BY MOUTH EVERY DAY 90 capsule 3  . fluticasone (FLONASE) 50 MCG/ACT nasal spray Place 2 sprays into both nostrils daily. 16 g 6  . guaiFENesin (ROBITUSSIN) 100 MG/5ML SOLN Take 5 mLs (100 mg total) by mouth every 4 (four) hours as needed for cough or to loosen phlegm. 236 mL 0  . hydrochlorothiazide (MICROZIDE) 12.5 MG capsule Take 1 capsule (12.5 mg total) by mouth daily. 90 capsule 3  . HYDROcodone-acetaminophen (NORCO/VICODIN) 5-325 MG tablet Take 1 tablet by mouth every 6 (six) hours as needed. 10 tablet 0  . potassium chloride SA (KLOR-CON) 20 MEQ tablet Take 1 tablet (20 mEq total) by mouth daily. 90 tablet 3  . predniSONE (DELTASONE) 10 MG tablet Take 1 tablet (10 mg total) by mouth daily with breakfast. 30 tablet 0   No current facility-administered medications on file prior to visit.   No Known Allergies Social History   Socioeconomic History  . Marital status: Married    Spouse name: Not on file  . Number of children: 1  . Years of education: Not on  file  . Highest education level: Not  on file  Occupational History  . Not on file  Tobacco Use  . Smoking status: Former Smoker    Packs/day: 0.75    Years: 15.00    Pack years: 11.25    Types: Cigarettes    Quit date: 03/20/2014    Years since quitting: 6.3  . Smokeless tobacco: Former Systems developer    Quit date: 03/11/2014  Vaping Use  . Vaping Use: Never used  Substance and Sexual Activity  . Alcohol use: No    Alcohol/week: 0.0 standard drinks  . Drug use: No  . Sexual activity: Not on file  Other Topics Concern  . Not on file  Social History Narrative  . Not on file   Social Determinants of Health   Financial Resource Strain: Not on file  Food Insecurity: Not on file  Transportation Needs: Not on file  Physical Activity: Not on file  Stress: Not on file  Social Connections: Not on file  Intimate Partner Violence: Not on file     Review of Systems  All other systems reviewed and are negative.      Objective:   Physical Exam Constitutional:      General: She is not in acute distress.    Appearance: Normal appearance. She is not toxic-appearing or diaphoretic.  Cardiovascular:     Rate and Rhythm: Normal rate and regular rhythm.     Heart sounds: Normal heart sounds. No murmur heard. No friction rub. No gallop.   Pulmonary:     Effort: Pulmonary effort is normal. No respiratory distress.     Breath sounds: Decreased air movement present. Wheezing present. No rales.  Abdominal:     General: Abdomen is flat. Bowel sounds are normal.     Palpations: Abdomen is soft.  Musculoskeletal:     Right lower leg: Deformity and tenderness present. No edema.     Left lower leg: No edema.       Legs:  Neurological:     Mental Status: She is alert.      See photograph above.  No evidence of cellulitis.     Assessment & Plan:  Wound of right leg, subsequent encounter  Chronic obstructive pulmonary disease, unspecified COPD type ([place]) - Plan: albuterol (VENTOLIN HFA) 108 (90 Base) MCG/ACT inhaler  I  covered the wound with gauze cover with Silvadene and wrapped it with Coban.  Continue 3 times a week dressing changes and allow the wound to close slowly through secondary intention.  I anticipate 3 to 4 weeks.  I refilled the patient's albuterol today as well as she is running out.  She can discontinue antibiotics at this point

## 2020-08-12 ENCOUNTER — Telehealth: Payer: Self-pay

## 2020-08-12 NOTE — Telephone Encounter (Signed)
Return call and Given verbal orders. Thank you

## 2020-08-12 NOTE — Telephone Encounter (Signed)
Please call and give verbal order

## 2020-08-12 NOTE — Telephone Encounter (Signed)
Irineo Axon PT @ [facility] called from [phone]. Ask for a verbal order to have Plan of care for BLE strengthing for duration of : 1x wk for 1 week; 2x wk for 5 wks; 1x wk for 2 wks.

## 2020-08-13 ENCOUNTER — Ambulatory Visit: Payer: Medicare Other | Admitting: Podiatry

## 2020-08-13 ENCOUNTER — Other Ambulatory Visit: Payer: Self-pay

## 2020-08-13 DIAGNOSIS — L989 Disorder of the skin and subcutaneous tissue, unspecified: Secondary | ICD-10-CM | POA: Diagnosis not present

## 2020-08-13 DIAGNOSIS — B351 Tinea unguium: Secondary | ICD-10-CM

## 2020-08-13 DIAGNOSIS — M79674 Pain in right toe(s): Secondary | ICD-10-CM

## 2020-08-13 DIAGNOSIS — M79675 Pain in left toe(s): Secondary | ICD-10-CM

## 2020-08-13 NOTE — Progress Notes (Signed)
    Subjective: Patient is a 75 y.o. female presenting to the office today with a chief complaint of painful callus lesion(s) noted to the bilateral feet that is been present for several months now. Patient also complains of elongated, thickened nails that cause pain while ambulating in shoes.  She is unable to trim her own nails. Patient presents today for further treatment and evaluation.  Past Medical History:  Diagnosis Date  . Asthma   . Chronic airflow obstruction (HCC)   . Hypertension   . Hypotension   . PAF (paroxysmal atrial fibrillation) ([place])   . Tobacco abuse   . Venous insufficiency     Objective:  Physical Exam General: Alert and oriented x3 in no acute distress  Dermatology: Hyperkeratotic lesion(s) present on the bilateral feet. Pain on palpation with a central nucleated core noted. Skin is warm, dry and supple bilateral lower extremities. Negative for open lesions or macerations. Nails are tender, long, thickened and dystrophic with subungual debris, consistent with onychomycosis, 1-5 bilateral. No signs of infection noted.  Vascular: Palpable pedal pulses bilaterally. No edema or erythema noted. Capillary refill within normal limits.  Neurological: Epicritic and protective threshold grossly intact bilaterally.   Musculoskeletal Exam: Pain on palpation at the keratotic lesion(s) noted. Range of motion within normal limits bilateral. Muscle strength 5/5 in all groups bilateral.  Assessment: 1. Onychodystrophic nails 1-5 bilateral with hyperkeratosis of nails.  2. Onychomycosis of nail due to dermatophyte bilateral 3.  Preulcerative calluses to the bilateral feet   Plan of Care:  1. Patient evaluated. 2. Excisional debridement of keratoic lesion(s) using a chisel blade was performed without incident.  3. Dressed with light dressing. 4. Mechanical debridement of nails 1-5 bilaterally performed using a nail nipper. Filed with dremel without incident.  5. Patient  is to return to the clinic in 3 months.   Edrick Kins, DPM [facility]  Dr. Edrick Kins, DPM    [address], [place] [address]                Office [phone]  Fax [phone]

## 2020-08-21 ENCOUNTER — Other Ambulatory Visit: Payer: Self-pay

## 2020-08-21 ENCOUNTER — Encounter: Payer: Self-pay | Admitting: Family Medicine

## 2020-08-21 ENCOUNTER — Ambulatory Visit (INDEPENDENT_AMBULATORY_CARE_PROVIDER_SITE_OTHER): Payer: Medicare Other | Admitting: Family Medicine

## 2020-08-21 VITALS — BP 122/70 | HR 72 | Temp 98.4°F | Resp 14

## 2020-08-21 DIAGNOSIS — J9611 Chronic respiratory failure with hypoxia: Secondary | ICD-10-CM | POA: Diagnosis not present

## 2020-08-21 MED ORDER — PREDNISONE 5 MG PO TBEC: 10.0000 mg | DELAYED_RELEASE_TABLET | Freq: Every day | ORAL | 1 refills | Status: DC

## 2020-08-21 MED ORDER — POTASSIUM CHLORIDE CRYS ER 20 MEQ PO TBCR: 20.0000 meq | EXTENDED_RELEASE_TABLET | Freq: Every day | ORAL | 3 refills | Status: DC

## 2020-08-21 NOTE — Progress Notes (Signed)
Subjective:    Patient ID: Diane Patterson, female    DOB: 03/13/1946, 75 y.o.   MRN: [id number] 07/22/20 1 week ago, the patient tripped and fell over a box at home sustaining a laceration and hematoma to the anterior surface of her right shin. She now has a massive hematoma directly below the skin surface on the anterior shin of the right leg. This is approximately 18 cm long and 5 cm wide. It is fluctuant due to the accumulation of blood below the skin surface and is oozing blood below the inferior portion. She denies any pain other than some mild tenderness. She denies any shortness of breath or chest pain beyond her baseline. In the emergency room she was evaluated. She was found to have leukocytosis with a white count of 17. She is here today for follow-up.  At that time, my plan was: Patient had leukocytosis in the emergency room. I will repeat a CBC today to determine if that is persistent or was due to the stress of the injury. I covered the hematoma with nonadherent gauze and then wrapped with Coban. I recommended dressing changes every 48 hours. However the patient is extremely frail and is unable to perform the dressing changes herself and lives alone. Therefore I will ask home health to consult for the patient and perform dressing changes every 2 to 3 days. Reassess in 1 week or sooner if worsening. No evidence of cellulitis today on exam   07/25/20   Hematoma has ruptured.  See the photograph above.  The clot underneath the skin has fully coagulated.  Is now a black/purple jellylike material.  I am unable to express it from the opening.  The swelling in the surrounding areas has gone down.  However the skin inferior to the hematoma is erythematous and warm.  I am concerned about a developing cellulitis particular given her leukocytosis with the opening in the hematoma is a portal of entry.  She is not currently taking any antibiotics.  She also does report increased urinary frequency as  well.  Patient is also requesting a wheelchair.  She is unable to stand due to weakness and deconditioning.  Without assistance, she is unable to get out of her bed and go to the toilet or use the bathroom or bathe.  She is unable to prepare her meals.  A manual wheelchair would assist the patient in maneuvering through her home so that she can perform activities of daily living such as bathing, toileting, and grooming as well as preparing her meals.  Also she is unable to walk out of her home.  The wheelchair would help in transporting the patient to and from doctor appointments.  Her home is large enough to accommodate a wheelchair.  At that time, my plan was: Due to the patient's COPD and frailty and deconditioning she is no longer able to walk without maximum assistance.  As I have stated earlier in the office note she requires a manual wheelchair.  Her home can accommodate the wheelchair.  She was use that chair to help perform her activities of daily living such as toileting, grooming, bathing, and preparing her meals.  She also needs a wheelchair to assist in transportation from her home.  I will gladly write a prescription for a wheelchair or fill out any paperwork that she needs to acquire the wheelchair.  I will start her on Keflex 500 mg 3 times daily for the next 7 days as I believe she  is developing cellulitis.  I will recheck the patient on Monday.  I dressed the wound with Silvadene covered it with nonadherent gauze, and then wrapped it with Coban.  I believe that the hematoma will slowly begin to resorb and the overlying skin will begin to degrade and breakdown.  Afterwards I believe that this will start to slough off hopefully revealing new skin below the hematoma.  May need to debride the wound on Monday at her recheck  07/28/20   As anticipated, the skin over top of the hematoma has started to slough off and separate.  This reveals coagulated blood underneath.  The entire area is one large  coagulated massive blood.  Due to patient discomfort, I am unable to manually extract the blood as it is still adherent to the underlying tissue.  However it could easily slough off.  The redness is fading distal to the leg on the Keflex as shown in the photograph above.  However the patient is having discomfort in the leg.  She has yet to hear from home health nursing regarding home health assistance  At that time, my plan was: Cellulitis has improved.  Skin above the hematoma is sloughing off as anticipated.  Recheck on Thursday.  At that time I believe we will be able to manually extract the clot as the majority of the skin will have sloughed off.  Then the wound will have to close to secondary intention.  I covered the entire area with Silvadene, nonadherent gauze, and then wrapped the area with Coban.  Continue to hold her anticoagulant and finish Keflex as prescribed.  I will send in [facility] for pain and I will have my staff check on the status of her home health referral  07/31/20  All the clot in the center stuck to the nonadherent gauze and therefore was debrided when we remove the dressing.  There is some residual clot clinging to the medial aspect of the wound that is roughly 3 cm x 2 cm.  There is also a small clotted area underneath the skin at the superior aspect of the wound.  The remainder of the wound is open down to the underlying dermis and requires reepithelialization similar to a burn.  Residual avascular skin and clot was removed from the area using dry gauze and a gentle wiping motion.  The entire wound bed was covered with copious amounts of Silvadene and then covered with 3 nonadherent gauze.  The leg was then wrapped with Coban as a pressure dressing.  At that time, my plan was: Slowly resorbing and healing through debridement.  The wound bed was covered with Silvadene and then covered with nonadherent gauze and then wrapped with Coban.  Recheck on Monday for repeat debridement of  avascular material and clot.   08/04/20   All the residual clot has been removed using debridement with gauze.  Above is pictured the underlying wound down to the dermis.  There is a thin peripheral rim of pink tissue showing reepithelialization.  There is no evidence of secondary cellulitis.  The leg was cleaned and scrubbed thoroughly with normal saline in a sterile fashion.  At that time, my plan was: Patient has still yet to hear from wound care and home health nursing.  I am not sure what the confusion or the mixup is.  Patient is obviously frustrated as I would be to.  Therefore we provided her the phone number with which to call them today so that she could follow-up  in case that they were having a difficult time reaching her.  I anticipate that this wound is going to take quite some time to heal through secondary intention.  Anticipate 4 to 6 weeks.  She will likely need 2-3 times a week dressing changes and this will be much easier for her if home health nursing to come to her home since she is confined to a wheelchair.  After cleaning the leg thoroughly, Silvadene was applied to the nonadherent gauze and then this was placed on the leg covering the entire wound.  The leg was then gently wrapped from below the knee around the leg all the way down past the ankle and around the foot.  Recheck here on Friday.  08/08/20   Home health is now coming out and changing the dressings every Monday, Wednesday, and Friday.  The wound continues to improve.  Is now even with the surface of the skin all the way around.  It is closing via secondary intention.  I feel that the superficial layer is slowly becoming a large scab that will ultimately start to pull away from the surrounding skin edges.  However I see no evidence of cellulitis or secondary infection.   At that time, my plan was: I covered the wound with gauze cover with Silvadene and wrapped it with Coban.  Continue 3 times a week dressing changes and  allow the wound to close slowly through secondary intention.  I anticipate 3 to 4 weeks.  I refilled the patient's albuterol today as well as she is running out.  She can discontinue antibiotics at this point  08/21/20 Home health care is finally coming out to her home and dressing her wound.  They just came today and therefore the patient does not want the dressing removed however she assures me that the wound is looking better.  She denies any pain in the wound and states that the opening is gradually slowly closing through secondary intention.  The reason for her appointment today is #1 to try to get a manual wheelchair approved.  The second reason is due to her breathing.  She just ran out of prednisone.  She was taking 10 mg a day.  She states that over the last 4 days she has noticed an increased frequency and coughing and wheezing.  She took albuterol prior to her visit today.  Today on examination, she has markedly diminished breath sounds bilaterally but this is her baseline.  Fortunately I do not hear any wheezes crackles or rails.  There is no increased work of breathing.  Honestly for the patient her breath sounds sound very good.  However she truly needs a wheelchair.  The patient cannot stand without maximum assistance.  She is unable to walk in her home.  Therefore she cannot perform activities of daily living such as going to the bathroom, bathing, cooking, grooming.  With a wheelchair, the patient will be able to navigate around her house to perform these activities of daily living.  She would be able to transfer from her bed to the wheelchair and then use the manual wheelchair to go to the bathroom in order to go to the toilet or to take a bath or to go to her kitchen to cook a meal.  It would also facilitate her mobility within the home and improve her quality of life.  The patient is motivated to use the wheelchair.  She is physically capable and mentally capable of using the wheelchair.  Furthermore her home can accommodate the wheelchair. Past Medical History:  Diagnosis Date  . Asthma   . Chronic airflow obstruction (HCC)   . Hypertension   . Hypotension   . PAF (paroxysmal atrial fibrillation) ([place])   . Tobacco abuse   . Venous insufficiency    Past Surgical History:  Procedure Laterality Date  . cataract    . NECK SURGERY    . no colonoscopy     "afraid to "; [facility] reviewed  . VESICOVAGINAL FISTULA CLOSURE W/ TAH     Current Outpatient Medications on File Prior to Visit  Medication Sig Dispense Refill  . albuterol (PROVENTIL) (2.5 MG/3ML) 0.083% nebulizer solution Take 3 mLs (2.5 mg total) by nebulization every 6 (six) hours as needed for wheezing or shortness of breath. 150 mL 1  . albuterol (VENTOLIN HFA) 108 (90 Base) MCG/ACT inhaler Inhale 2 puffs into the lungs every 4 (four) hours as needed for wheezing or shortness of breath. 18 g 3  . apixaban (ELIQUIS) 5 MG TABS tablet Take 1 tablet (5 mg total) by mouth 2 (two) times daily. 180 tablet 1  . budesonide-formoterol (SYMBICORT) 160-4.5 MCG/ACT inhaler INHALE 2 PUFFS INTO LUNGS TWICE DAILY 1 each 11  . cephALEXin (KEFLEX) 500 MG capsule Take 1 capsule (500 mg total) by mouth 3 (three) times daily. 42 capsule 0  . diltiazem (CARDIZEM CD) 240 MG 24 hr capsule TAKE 1 CAPSULE BY MOUTH EVERY DAY 90 capsule 3  . fluticasone (FLONASE) 50 MCG/ACT nasal spray Place 2 sprays into both nostrils daily. 16 g 6  . guaiFENesin (ROBITUSSIN) 100 MG/5ML SOLN Take 5 mLs (100 mg total) by mouth every 4 (four) hours as needed for cough or to loosen phlegm. 236 mL 0  . HYDROcodone-acetaminophen (NORCO/VICODIN) 5-325 MG tablet Take 1 tablet by mouth every 6 (six) hours as needed. 10 tablet 0  . potassium chloride SA (KLOR-CON) 20 MEQ tablet Take 1 tablet (20 mEq total) by mouth daily. 90 tablet 3  . predniSONE (DELTASONE) 10 MG tablet Take 1 tablet (10 mg total) by mouth daily with breakfast. 30 tablet 0  . hydrochlorothiazide  (MICROZIDE) 12.5 MG capsule Take 1 capsule (12.5 mg total) by mouth daily. 90 capsule 3   No current facility-administered medications on file prior to visit.   No Known Allergies Social History   Socioeconomic History  . Marital status: Married    Spouse name: Not on file  . Number of children: 1  . Years of education: Not on file  . Highest education level: Not on file  Occupational History  . Not on file  Tobacco Use  . Smoking status: Former Smoker    Packs/day: 0.75    Years: 15.00    Pack years: 11.25    Types: Cigarettes    Quit date: 03/20/2014    Years since quitting: 6.4  . Smokeless tobacco: Former Systems developer    Quit date: 03/11/2014  Vaping Use  . Vaping Use: Never used  Substance and Sexual Activity  . Alcohol use: No    Alcohol/week: 0.0 standard drinks  . Drug use: No  . Sexual activity: Not on file  Other Topics Concern  . Not on file  Social History Narrative  . Not on file   Social Determinants of Health   Financial Resource Strain: Not on file  Food Insecurity: Not on file  Transportation Needs: Not on file  Physical Activity: Not on file  Stress: Not on file  Social Connections: Not  on file  Intimate Partner Violence: Not on file     Review of Systems  All other systems reviewed and are negative.      Objective:   Physical Exam Constitutional:      General: She is not in acute distress.    Appearance: Normal appearance. She is not toxic-appearing or diaphoretic.  Cardiovascular:     Rate and Rhythm: Normal rate and regular rhythm.     Heart sounds: Normal heart sounds. No murmur heard. No friction rub. No gallop.   Pulmonary:     Effort: Pulmonary effort is normal. No respiratory distress.     Breath sounds: Decreased air movement present. No wheezing or rales.  Abdominal:     General: Abdomen is flat. Bowel sounds are normal.     Palpations: Abdomen is soft.  Musculoskeletal:     Right lower leg: No edema.     Left lower leg: No  edema.  Neurological:     Mental Status: She is alert.          Assessment & Plan:  Chronic respiratory failure with hypoxia (HCC)  Patient's breathing is honestly at her baseline.  Therefore I recommended we gradually try to wean her off prednisone.  Decrease prednisone to 7.5 mg every day for the next 2 weeks and if tolerated gradually wean down to 5 mg every day for 2 to 4 weeks and then wean to 2.5 mg and so on until we have the patient off the prednisone altogether.  Continue to use her rescue inhaler every 4-6 hours as needed.  Furthermore I will petition a durable medical equipment provider for her wheelchair.  Due to her chronic respiratory failure and general deconditioning, the patient is unable to stand or even walk around her home.  She is fallen numerous times and this is what has caused the large wound on her anterior right leg that has now been several months and healing.  Therefore she would benefit from a manual wheelchair.  She cannot perform activities of daily living such as going to the bathroom, bathing, cooking, grooming due to her leg weakness and her shortness of breath with activity.  With a wheelchair, the patient will be able to navigate around her house to perform these activities of daily living.  She would be able to transfer from her bed to the wheelchair and then use the manual wheelchair to go to the bathroom in order to go to the toilet or to take a bath or to go to her kitchen to cook a meal.  It would also facilitate her mobility within the home and improve her quality of life.  The patient is motivated to use the wheelchair.  She is physically capable and mentally capable of using the wheelchair.  Furthermore her home can accommodate the wheelchair.

## 2020-08-22 ENCOUNTER — Telehealth: Payer: Self-pay | Admitting: Family Medicine

## 2020-08-22 ENCOUNTER — Other Ambulatory Visit: Payer: Self-pay | Admitting: Family Medicine

## 2020-08-22 MED ORDER — PREDNISONE 5 MG PO TABS: 10.0000 mg | ORAL_TABLET | Freq: Every day | ORAL | 2 refills | Status: DC

## 2020-08-22 NOTE — Telephone Encounter (Signed)
Prior Auth for rayos 5 mg dr tablets   Medication that doesn't require PA prednisone and methylplprednilsolone  KEY # [id number]

## 2020-08-22 NOTE — Telephone Encounter (Signed)
Please advise 

## 2020-08-24 ENCOUNTER — Emergency Department (HOSPITAL_COMMUNITY): Payer: Medicare Other

## 2020-08-24 ENCOUNTER — Emergency Department (HOSPITAL_COMMUNITY)
Admission: EM | Admit: 2020-08-24 | Discharge: 2020-08-25 | Disposition: A | Discharge status: Home / Self Care | Payer: Medicare Other | Attending: Emergency Medicine | Admitting: Emergency Medicine

## 2020-08-24 ENCOUNTER — Encounter (HOSPITAL_COMMUNITY): Payer: Self-pay

## 2020-08-24 ENCOUNTER — Other Ambulatory Visit: Payer: Self-pay

## 2020-08-24 DIAGNOSIS — J45909 Unspecified asthma, uncomplicated: Secondary | ICD-10-CM | POA: Diagnosis not present

## 2020-08-24 DIAGNOSIS — Z7951 Long term (current) use of inhaled steroids: Secondary | ICD-10-CM | POA: Insufficient documentation

## 2020-08-24 DIAGNOSIS — J441 Chronic obstructive pulmonary disease with (acute) exacerbation: Secondary | ICD-10-CM | POA: Insufficient documentation

## 2020-08-24 DIAGNOSIS — Z79899 Other long term (current) drug therapy: Secondary | ICD-10-CM | POA: Insufficient documentation

## 2020-08-24 DIAGNOSIS — Z7901 Long term (current) use of anticoagulants: Secondary | ICD-10-CM | POA: Insufficient documentation

## 2020-08-24 DIAGNOSIS — Z85828 Personal history of other malignant neoplasm of skin: Secondary | ICD-10-CM | POA: Diagnosis not present

## 2020-08-24 DIAGNOSIS — E119 Type 2 diabetes mellitus without complications: Secondary | ICD-10-CM | POA: Insufficient documentation

## 2020-08-24 DIAGNOSIS — R0602 Shortness of breath: Secondary | ICD-10-CM

## 2020-08-24 DIAGNOSIS — I10 Essential (primary) hypertension: Secondary | ICD-10-CM | POA: Diagnosis not present

## 2020-08-24 DIAGNOSIS — Z87891 Personal history of nicotine dependence: Secondary | ICD-10-CM | POA: Diagnosis not present

## 2020-08-24 MED ORDER — ALBUTEROL SULFATE HFA 108 (90 BASE) MCG/ACT IN AERS: 2.0000 | INHALATION_SPRAY | RESPIRATORY_TRACT | Status: DC | PRN

## 2020-08-24 NOTE — ED Triage Notes (Signed)
EMS reports pt is from home. C/O SOB after ambulating without her home O2 on. She is normally on O2 at 2lpm via [place]. Lung sounds were CTA. BP - 160/72, H - 90, RR- 16, O2 sat - 99% on 2 lpm, Temp 97.3.

## 2020-08-24 NOTE — ED Notes (Signed)
Pt denies being SOB or chest pain at this time.

## 2020-08-25 LAB — CBC
HCT: 33.4 % — ABNORMAL LOW (ref 36.0–46.0)
Hemoglobin: 10.6 g/dL — ABNORMAL LOW (ref 12.0–15.0)
MCH: 29.3 pg (ref 26.0–34.0)
MCHC: 31.7 g/dL (ref 30.0–36.0)
MCV: 92.3 fL (ref 80.0–100.0)
Platelets: 337 10*3/uL (ref 150–400)
RBC: 3.62 MIL/uL — ABNORMAL LOW (ref 3.87–5.11)
RDW: 13.9 % (ref 11.5–15.5)
WBC: 12 10*3/uL — ABNORMAL HIGH (ref 4.0–10.5)
nRBC: 0 % (ref 0.0–0.2)

## 2020-08-25 LAB — BASIC METABOLIC PANEL
Anion gap: 9 (ref 5–15)
BUN: 19 mg/dL (ref 8–23)
CO2: 27 mmol/L (ref 22–32)
Calcium: 9 mg/dL (ref 8.9–10.3)
Chloride: 100 mmol/L (ref 98–111)
Creatinine, Ser: 0.81 mg/dL (ref 0.44–1.00)
GFR, Estimated: >60 mL/min (ref >60)
Glucose, Bld: 107 mg/dL — ABNORMAL HIGH (ref 70–99)
Potassium: 3.3 mmol/L — ABNORMAL LOW (ref 3.5–5.1)
Sodium: 136 mmol/L (ref 135–145)

## 2020-08-25 NOTE — ED Provider Notes (Signed)
[facility] EMERGENCY DEPARTMENT Provider Note   CSN: [id number] Arrival date & time: 08/24/20  2138     History No chief complaint on file.   Diane Patterson is a 75 y.o. female.  Patient presents to the emergency department for evaluation of shortness of breath.  Patient has a history of chronic respiratory failure with hypoxia requiring continuous supplemental oxygen by nasal cannula.  Patient reports that she was feeling increased shortness of breath tonight.  EMS reports that the patient was not on her oxygen when they arrived on the scene.  Patient reports that she keeps feeling very hot and it is difficult to keep her oxygen on.  She reports that she has noticed that her saliva is thicker than usual but has not had any cough or chest congestion.  She has not had any fever.  Patient denies chest pain.        Past Medical History:  Diagnosis Date  . Asthma   . Chronic airflow obstruction (HCC)   . Hypertension   . Hypotension   . PAF (paroxysmal atrial fibrillation) ([place])   . Tobacco abuse   . Venous insufficiency     Patient Active Problem List   Diagnosis Date Noted  . Gait instability 05/27/2020  . Paroxysmal A-fib ([place]) 03/03/2020  . Diabetes mellitus type 2, uncomplicated ([place]) 61/44/3154  . Atrial fibrillation with RVR ([place])   . AKI (acute kidney injury) ([place]) 07/11/2017  . Chronic respiratory failure with hypoxia ([place]) 11/15/2016  . Allergic rhinitis 10/14/2016  . Contusion 12/01/2015  . Abscess of umbilicus 00/86/7619  . COPD exacerbation ([place]) 06/23/2015  . Right carotid bruit 08/19/2014  . Hyperlipidemia 08/19/2014  . Prediabetes 08/19/2014  . Bruising 09/26/2013  . History of basal cell cancer 09/26/2013  . Oral candidiasis 01/10/2013  . Essential hypertension 12/21/2011  . COPD (chronic obstructive pulmonary disease) ([place]) 01/20/2011  . Constipation 06/24/2010  . ABDOMINAL BLOATING 06/24/2010  . ATHEROSCLEROSIS OF AORTA  02/10/2010  . FATTY LIVER DISEASE 02/10/2010  . Anxiety state 05/14/2008  . Former smoker 12/28/2006  . VENOUS INSUFFICIENCY 12/28/2006    Past Surgical History:  Procedure Laterality Date  . cataract    . NECK SURGERY    . no colonoscopy     "afraid to "; [facility] reviewed  . VESICOVAGINAL FISTULA CLOSURE W/ TAH       OB History   No obstetric history on file.     Family History  Problem Relation Age of Onset  . Diabetes Mother   . Emphysema Father   . Asthma Father   . Heart disease Father     Social History   Tobacco Use  . Smoking status: Former Smoker    Packs/day: 0.75    Years: 15.00    Pack years: 11.25    Types: Cigarettes    Quit date: 03/20/2014    Years since quitting: 6.4  . Smokeless tobacco: Former Systems developer    Quit date: 03/11/2014  Vaping Use  . Vaping Use: Never used  Substance Use Topics  . Alcohol use: No    Alcohol/week: 0.0 standard drinks  . Drug use: No    Home Medications Prior to Admission medications   Medication Sig Start Date End Date Taking? Authorizing Provider  albuterol (PROVENTIL) (2.5 MG/3ML) 0.083% nebulizer solution Take 3 mLs (2.5 mg total) by nebulization every 6 (six) hours as needed for wheezing or shortness of breath. 05/27/20   Eulogio Bear, NP  albuterol (VENTOLIN  HFA) 108 (90 Base) MCG/ACT inhaler Inhale 2 puffs into the lungs every 4 (four) hours as needed for wheezing or shortness of breath. 08/08/20   Susy Frizzle, MD  apixaban (ELIQUIS) 5 MG TABS tablet Take 1 tablet (5 mg total) by mouth 2 (two) times daily. 12/25/19   Jerline Pain, MD  budesonide-formoterol (SYMBICORT) 160-4.5 MCG/ACT inhaler INHALE 2 PUFFS INTO LUNGS TWICE DAILY 07/22/20   Susy Frizzle, MD  cephALEXin (KEFLEX) 500 MG capsule Take 1 capsule (500 mg total) by mouth 3 (three) times daily. 07/25/20   Susy Frizzle, MD  diltiazem (CARDIZEM CD) 240 MG 24 hr capsule TAKE 1 CAPSULE BY MOUTH EVERY DAY 04/15/20   Jerline Pain, MD  fluticasone  (FLONASE) 50 MCG/ACT nasal spray Place 2 sprays into both nostrils daily. 07/07/20   Susy Frizzle, MD  guaiFENesin (ROBITUSSIN) 100 MG/5ML SOLN Take 5 mLs (100 mg total) by mouth every 4 (four) hours as needed for cough or to loosen phlegm. 05/27/20   Eulogio Bear, NP  hydrochlorothiazide (MICROZIDE) 12.5 MG capsule Take 1 capsule (12.5 mg total) by mouth daily. 04/11/20 07/10/20  Dunn, Nedra Hai, PA-C  HYDROcodone-acetaminophen (NORCO/VICODIN) 5-325 MG tablet Take 1 tablet by mouth every 6 (six) hours as needed. 07/20/20   Henderly, Britni A, PA-C  potassium chloride SA (KLOR-CON) 20 MEQ tablet Take 1 tablet (20 mEq total) by mouth daily. 08/21/20   Susy Frizzle, MD  predniSONE (DELTASONE) 5 MG tablet Take 2 tablets (10 mg total) by mouth daily with breakfast. 08/22/20   Susy Frizzle, MD    Allergies    Patient has no known allergies.  Review of Systems   Review of Systems  Respiratory: Positive for shortness of breath.   All other systems reviewed and are negative.   Physical Exam Updated Vital Signs BP (!) 152/67   Pulse 100   Temp 98.3 F (36.8 C) (Oral)   Resp 18   Ht 5\' 7"  (1.702 m)   Wt 46.3 kg   LMP  (LMP Unknown)   SpO2 98%   BMI 15.98 kg/m   Physical Exam Vitals and nursing note reviewed.  Constitutional:      General: She is not in acute distress.    Appearance: Normal appearance. She is well-developed.  HENT:     Head: Normocephalic and atraumatic.     Right Ear: Hearing normal.     Left Ear: Hearing normal.     Nose: Nose normal.  Eyes:     Conjunctiva/sclera: Conjunctivae normal.     Pupils: Pupils are equal, round, and reactive to light.  Cardiovascular:     Rate and Rhythm: Regular rhythm.     Heart sounds: S1 normal and S2 normal. No murmur heard. No friction rub. No gallop.   Pulmonary:     Effort: Pulmonary effort is normal. No respiratory distress.     Breath sounds: Normal breath sounds.  Chest:     Chest wall: No tenderness.   Abdominal:     General: Bowel sounds are normal.     Palpations: Abdomen is soft.     Tenderness: There is no abdominal tenderness. There is no guarding or rebound. Negative signs include Murphy's sign and McBurney's sign.     Hernia: No hernia is present.  Musculoskeletal:        General: Normal range of motion.     Cervical back: Normal range of motion and neck supple.  Skin:  General: Skin is warm and dry.     Findings: No rash.  Neurological:     Mental Status: She is alert and oriented to person, place, and time.     GCS: GCS eye subscore is 4. GCS verbal subscore is 5. GCS motor subscore is 6.     Cranial Nerves: No cranial nerve deficit.     Sensory: No sensory deficit.     Coordination: Coordination normal.  Psychiatric:        Mood and Affect: Mood is anxious.        Speech: Speech normal.        Behavior: Behavior normal.        Thought Content: Thought content normal.     ED Results / Procedures / Treatments   Labs (all labs ordered are listed, but only abnormal results are displayed) Labs Reviewed  BASIC METABOLIC PANEL - Abnormal; Notable for the following components:      Result Value   Potassium 3.3 (*)    Glucose, Bld 107 (*)    All other components within normal limits  CBC - Abnormal; Notable for the following components:   WBC 12.0 (*)    RBC 3.62 (*)    Hemoglobin 10.6 (*)    HCT 33.4 (*)    All other components within normal limits    EKG EKG Interpretation  Date/Time:  Sunday August 24 2020 22:41:04 EDT Ventricular Rate:  92 PR Interval:  138 QRS Duration: 90 QT Interval:  364 QTC Calculation: 450 R Axis:   -63 Text Interpretation: Normal sinus rhythm Left axis deviation Abnormal ECG When compared with ECG of EARLIER SAME DATE No significant change was found Confirmed by Delora Fuel ([id number]) on 08/24/2020 11:08:40 PM   Radiology DG Chest 2 View  Result Date: 08/24/2020 CLINICAL DATA:  Short of breath EXAM: CHEST - 2 VIEW COMPARISON:   06/04/2020 FINDINGS: Frontal and lateral views of the chest demonstrate a stable cardiac silhouette. Emphysema and hyperinflation again noted, without significant change since prior study. No acute airspace disease, effusion, or pneumothorax. Chronic wedge compression deformity at the T12 level. No acute bony abnormalities. IMPRESSION: 1. Stable emphysema.  No acute process. Electronically Signed   By: Randa Ngo M.D.   On: 08/24/2020 23:57    Procedures Procedures   Medications Ordered in ED Medications  albuterol (VENTOLIN HFA) 108 (90 Base) MCG/ACT inhaler 2 puff (has no administration in time range)    ED Course  I have reviewed the triage vital signs and the nursing notes.  Pertinent labs & imaging results that were available during my care of the patient were reviewed by me and considered in my medical decision making (see chart for details).    MDM Rules/Calculators/A&P                          Patient presents to the emergency department for evaluation of shortness of breath.  Patient reports that she started to feel short of breath at home earlier tonight.  At arrival to the ER, she is anxious.  She keeps taking her nasal cannula oxygen off because she says that makes her feel warm.  Oxygen saturations have been normal.  Lungs are clear.  Vitals are otherwise unremarkable.  Patient's chest x-ray does not show any acute pathology.  No clinical concern for URI or pneumonia.  Basic labs are unremarkable.  Patient is now able to keep her oxygen in place and feels  much better.  She is less anxious.  She reports that she would like to go home and as she has a normal examination and basic work-up, I feel it is reasonable to discharge her and have her follow-up with primary care as needed.  She is not experiencing any chest pain or infectious symptoms.  Final Clinical Impression(s) / ED Diagnoses Final diagnoses:  Shortness of breath    Rx / DC Orders ED Discharge Orders    None        Kaiesha Tonner, Gwenyth Allegra, MD 08/25/20 [phone]

## 2020-08-25 NOTE — ED Notes (Signed)
Pt states its too hot to keep oxygen in nose.

## 2020-08-26 ENCOUNTER — Telehealth: Payer: Self-pay | Admitting: *Deleted

## 2020-08-26 NOTE — Telephone Encounter (Signed)
Transition Care Management Unsuccessful Follow-up Telephone Call  Date of discharge and from where: 3-14 Diane Patterson  Attempts:  1st  Reason for unsuccessful TCM follow-up call:  Unable to reach/   Tried twice would not go through

## 2020-08-27 ENCOUNTER — Telehealth: Payer: Self-pay | Admitting: *Deleted

## 2020-08-27 NOTE — Telephone Encounter (Signed)
Transition Care Management Unsuccessful Follow-up Telephone Call  Date of discharge and from where:  08-26-2020 [place]  Attempts:  2nd  Reason for unsuccessful TCM follow-up call:  Unable to reach by phone. Call would not go through

## 2020-08-28 NOTE — Telephone Encounter (Signed)
Transition Care Management Unsuccessful Follow-up Telephone Call  Date of discharge and from where:  08/26/2020 from [facility]  Attempts:  3rd Attempt  Reason for unsuccessful TCM follow-up call:  Left voice message

## 2020-09-04 ENCOUNTER — Telehealth: Payer: Self-pay | Admitting: Emergency Medicine

## 2020-09-04 MED ORDER — AZITHROMYCIN 250 MG PO TABS: 250.0000 mg | ORAL_TABLET | ORAL | 0 refills | Status: DC

## 2020-09-04 MED ORDER — PREDNISONE 10 MG PO TABS: ORAL_TABLET | ORAL | 0 refills | Status: DC

## 2020-09-04 NOTE — Telephone Encounter (Signed)
Spoke with the pt  She is c/o increased SOB x 3 days  She states having a lot of wheezing also  She is coughing more with thick, white sputum "like glue" Has not had f/c/s, aches  Still taking symbicort and has been using her albuterol inhaler multiple times per day  She is out of her albuterol nebs  She is currently on 5 mg pred qd  Asking for recs about symptoms and also would like to know if ok to switch providers to [facility]  Please advise thanks!  No Known Allergies

## 2020-09-04 NOTE — Telephone Encounter (Signed)
Spoke with the pt and notified of response per Dr Lamonte Sakai  Rxs were both sent to pharm   MR- are you okay taking on this pt? Dr Lamonte Sakai approved her switching to you per her request.

## 2020-09-04 NOTE — Telephone Encounter (Signed)
Spoke with the pt and scheduled for 10/09/20

## 2020-09-04 NOTE — Telephone Encounter (Signed)
It is ok w me if she wants to establish w Dr Chase Caller  Have her take azithro > Z pack Pred > Take 40mg  daily for 3 days, then 30mg  daily for 3 days, then 20mg  daily for 3 days, then 10mg  daily for 3 days, then stop

## 2020-09-04 NOTE — Telephone Encounter (Signed)
Ok I can see first avail 30 min slot

## 2020-09-08 ENCOUNTER — Other Ambulatory Visit: Payer: Self-pay

## 2020-09-08 ENCOUNTER — Ambulatory Visit (INDEPENDENT_AMBULATORY_CARE_PROVIDER_SITE_OTHER): Payer: Medicare Other | Admitting: Family Medicine

## 2020-09-08 ENCOUNTER — Encounter: Payer: Self-pay | Admitting: Family Medicine

## 2020-09-08 VITALS — BP 120/64 | HR 92 | Temp 98.3°F | Resp 18

## 2020-09-08 DIAGNOSIS — R350 Frequency of micturition: Secondary | ICD-10-CM | POA: Diagnosis not present

## 2020-09-08 MED ORDER — CIPROFLOXACIN HCL 500 MG PO TABS: 500.0000 mg | ORAL_TABLET | Freq: Two times a day (BID) | ORAL | 0 refills | Status: DC

## 2020-09-08 NOTE — Progress Notes (Signed)
Subjective:    Patient ID: Diane Patterson, female    DOB: 01-Nov-1945, 75 y.o.   MRN: [id number]  Patient states that she needs another antibiotic.  Recently I gave her an antibiotic for a bladder infection.  She states that she has the same symptoms.  She states that she is having to urinate frequently.  She is going 7+ times a day.  She denies any dysuria.  She denies any hematuria.  She denies any hesitancy.  Instead it simply just increased frequency and urgency. Past Medical History:  Diagnosis Date  . Asthma   . Chronic airflow obstruction (HCC)   . Hypertension   . Hypotension   . PAF (paroxysmal atrial fibrillation) ([place])   . Tobacco abuse   . Venous insufficiency    Past Surgical History:  Procedure Laterality Date  . cataract    . NECK SURGERY    . no colonoscopy     "afraid to "; [facility] reviewed  . VESICOVAGINAL FISTULA CLOSURE W/ TAH     Current Outpatient Medications on File Prior to Visit  Medication Sig Dispense Refill  . albuterol (PROVENTIL) (2.5 MG/3ML) 0.083% nebulizer solution Take 3 mLs (2.5 mg total) by nebulization every 6 (six) hours as needed for wheezing or shortness of breath. 150 mL 1  . albuterol (VENTOLIN HFA) 108 (90 Base) MCG/ACT inhaler Inhale 2 puffs into the lungs every 4 (four) hours as needed for wheezing or shortness of breath. 18 g 3  . apixaban (ELIQUIS) 5 MG TABS tablet Take 1 tablet (5 mg total) by mouth 2 (two) times daily. 180 tablet 1  . budesonide-formoterol (SYMBICORT) 160-4.5 MCG/ACT inhaler INHALE 2 PUFFS INTO LUNGS TWICE DAILY 1 each 11  . diltiazem (CARDIZEM CD) 240 MG 24 hr capsule TAKE 1 CAPSULE BY MOUTH EVERY DAY 90 capsule 3  . fluticasone (FLONASE) 50 MCG/ACT nasal spray Place 2 sprays into both nostrils daily. 16 g 6  . guaiFENesin (ROBITUSSIN) 100 MG/5ML SOLN Take 5 mLs (100 mg total) by mouth every 4 (four) hours as needed for cough or to loosen phlegm. 236 mL 0  . HYDROcodone-acetaminophen (NORCO/VICODIN) 5-325 MG tablet  Take 1 tablet by mouth every 6 (six) hours as needed. 10 tablet 0  . potassium chloride SA (KLOR-CON) 20 MEQ tablet Take 1 tablet (20 mEq total) by mouth daily. 90 tablet 3  . predniSONE (DELTASONE) 5 MG tablet Take 2 tablets (10 mg total) by mouth daily with breakfast. 60 tablet 2  . hydrochlorothiazide (MICROZIDE) 12.5 MG capsule Take 1 capsule (12.5 mg total) by mouth daily. 90 capsule 3   No current facility-administered medications on file prior to visit.   No Known Allergies Social History   Socioeconomic History  . Marital status: Married    Spouse name: Not on file  . Number of children: 1  . Years of education: Not on file  . Highest education level: Not on file  Occupational History  . Not on file  Tobacco Use  . Smoking status: Former Smoker    Packs/day: 0.75    Years: 15.00    Pack years: 11.25    Types: Cigarettes    Quit date: 03/20/2014    Years since quitting: 6.4  . Smokeless tobacco: Former Systems developer    Quit date: 03/11/2014  Vaping Use  . Vaping Use: Never used  Substance and Sexual Activity  . Alcohol use: No    Alcohol/week: 0.0 standard drinks  . Drug use: No  . Sexual  activity: Not on file  Other Topics Concern  . Not on file  Social History Narrative  . Not on file   Social Determinants of Health   Financial Resource Strain: Not on file  Food Insecurity: Not on file  Transportation Needs: Not on file  Physical Activity: Not on file  Stress: Not on file  Social Connections: Not on file  Intimate Partner Violence: Not on file     Review of Systems  All other systems reviewed and are negative.      Objective:   Physical Exam Constitutional:      General: She is not in acute distress.    Appearance: Normal appearance. She is not toxic-appearing or diaphoretic.  Cardiovascular:     Rate and Rhythm: Normal rate and regular rhythm.     Heart sounds: Normal heart sounds. No murmur heard. No friction rub. No gallop.   Pulmonary:     Effort:  Pulmonary effort is normal. No respiratory distress.     Breath sounds: Decreased air movement present. Wheezing present. No rales.  Abdominal:     General: Abdomen is flat. Bowel sounds are normal.     Palpations: Abdomen is soft.  Musculoskeletal:     Right lower leg: No edema.     Left lower leg: No edema.  Neurological:     Mental Status: She is alert.           Assessment & Plan:  Frequency of urination - Plan: Urinalysis, Routine w reflex microscopic, Urine Culture  Patient can barely give [place] enough urine for a urine culture today.  Therefore I would like to obtain a urine culture.  She is very concerned that she has a bladder infection is not being adequately treated.  I will give her Cipro 500 mg twice daily for 5 days and await the results of the bladder culture.  If the urine culture is negative, I would treat the patient for overactive bladder.

## 2020-09-09 ENCOUNTER — Telehealth: Payer: Self-pay | Admitting: *Deleted

## 2020-09-09 LAB — URINE CULTURE
MICRO NUMBER:: 11699596
SPECIMEN QUALITY:: ADEQUATE

## 2020-09-09 NOTE — Telephone Encounter (Signed)
Its ok, go ahead.

## 2020-09-09 NOTE — Telephone Encounter (Signed)
Received call from [place], [facility] with [facility].   Reports that medication interaction noted between Cipro and Prednisone as follows: Concomitant administration of corticosteroids may potentiate the risk of tendinitis and tendon rupture associated with fluoroquinolone treatment.  Please advise.

## 2020-09-09 NOTE — Telephone Encounter (Signed)
Call placed to [facility] and she was made aware.

## 2020-09-11 ENCOUNTER — Other Ambulatory Visit: Payer: Self-pay | Admitting: *Deleted

## 2020-09-11 MED ORDER — SOLIFENACIN SUCCINATE 10 MG PO TABS: 10.0000 mg | ORAL_TABLET | Freq: Every day | ORAL | 1 refills | Status: AC

## 2020-10-07 ENCOUNTER — Telehealth: Payer: Self-pay | Admitting: Family Medicine

## 2020-10-07 NOTE — Telephone Encounter (Signed)
Copied from [place] [phone]. Topic: Medicare AWV >> Oct 07, 2020  1:24 PM Cher Nakai R wrote: Reason for CRM:  Left message for patient to call back and schedule Medicare Annual Wellness Visit (AWV) in office.   If not able to come in office, please offer to do virtually or by telephone.   Last AWV: 08/19/2014  Please schedule at anytime with [facility]-Nurse Health Advisor.  If any questions, please contact me at [phone]

## 2020-10-09 ENCOUNTER — Ambulatory Visit: Payer: Medicare Other | Admitting: Internal Medicine

## 2020-10-20 ENCOUNTER — Encounter: Payer: Self-pay | Admitting: Family Medicine

## 2020-10-20 ENCOUNTER — Other Ambulatory Visit: Payer: Self-pay

## 2020-10-20 ENCOUNTER — Ambulatory Visit (INDEPENDENT_AMBULATORY_CARE_PROVIDER_SITE_OTHER): Payer: Medicare Other | Admitting: Family Medicine

## 2020-10-20 DIAGNOSIS — J449 Chronic obstructive pulmonary disease, unspecified: Secondary | ICD-10-CM | POA: Diagnosis not present

## 2020-10-20 DIAGNOSIS — J441 Chronic obstructive pulmonary disease with (acute) exacerbation: Secondary | ICD-10-CM | POA: Diagnosis not present

## 2020-10-20 MED ORDER — BUDESONIDE-FORMOTEROL FUMARATE 160-4.5 MCG/ACT IN AERO: INHALATION_SPRAY | RESPIRATORY_TRACT | 11 refills | Status: DC

## 2020-10-20 MED ORDER — HYDROCOD POLST-CPM POLST ER 10-8 MG/5ML PO SUER: 5.0000 mL | Freq: Two times a day (BID) | ORAL | 0 refills | Status: DC | PRN

## 2020-10-20 MED ORDER — PREDNISONE 20 MG PO TABS: 40.0000 mg | ORAL_TABLET | Freq: Every day | ORAL | 0 refills | Status: DC

## 2020-10-20 MED ORDER — ALBUTEROL SULFATE HFA 108 (90 BASE) MCG/ACT IN AERS: 2.0000 | INHALATION_SPRAY | RESPIRATORY_TRACT | 3 refills | Status: DC | PRN

## 2020-10-20 MED ORDER — AZITHROMYCIN 250 MG PO TABS: ORAL_TABLET | ORAL | 0 refills | Status: DC

## 2020-10-20 NOTE — Addendum Note (Signed)
Addended by: Dessie Coma on: 10/20/2020 12:01 PM   Modules accepted: Orders

## 2020-10-20 NOTE — Progress Notes (Signed)
Subjective:    Patient ID: Diane Patterson, female    DOB: 1945/12/10, 75 y.o.   MRN: [id number] Patient states that she got sick about a week ago.  The person who brought her oxygen to her home had an upper respiratory infection and she believes she caught it from them.  Over the last week she has developed a worsening cough, increasing shortness of breath, wheezing.  She normally has "glue-like" sputum.  She states that is thick and white.  However the amount has increased.  She reports copious sputum.  Is also now green.  She also reports chest congestion and wheezing. Past Medical History:  Diagnosis Date  . Asthma   . Chronic airflow obstruction (HCC)   . Hypertension   . Hypotension   . PAF (paroxysmal atrial fibrillation) ([place])   . Tobacco abuse   . Venous insufficiency    Past Surgical History:  Procedure Laterality Date  . cataract    . NECK SURGERY    . no colonoscopy     "afraid to "; [facility] reviewed  . VESICOVAGINAL FISTULA CLOSURE W/ TAH     Current Outpatient Medications on File Prior to Visit  Medication Sig Dispense Refill  . albuterol (PROVENTIL) (2.5 MG/3ML) 0.083% nebulizer solution Take 3 mLs (2.5 mg total) by nebulization every 6 (six) hours as needed for wheezing or shortness of breath. 150 mL 1  . albuterol (VENTOLIN HFA) 108 (90 Base) MCG/ACT inhaler Inhale 2 puffs into the lungs every 4 (four) hours as needed for wheezing or shortness of breath. 18 g 3  . apixaban (ELIQUIS) 5 MG TABS tablet Take 1 tablet (5 mg total) by mouth 2 (two) times daily. 180 tablet 1  . budesonide-formoterol (SYMBICORT) 160-4.5 MCG/ACT inhaler INHALE 2 PUFFS INTO LUNGS TWICE DAILY 1 each 11  . diltiazem (CARDIZEM CD) 240 MG 24 hr capsule TAKE 1 CAPSULE BY MOUTH EVERY DAY 90 capsule 3  . fluticasone (FLONASE) 50 MCG/ACT nasal spray Place 2 sprays into both nostrils daily. 16 g 6  . guaiFENesin (ROBITUSSIN) 100 MG/5ML SOLN Take 5 mLs (100 mg total) by mouth every 4 (four) hours as  needed for cough or to loosen phlegm. 236 mL 0  . hydrochlorothiazide (MICROZIDE) 12.5 MG capsule Take 1 capsule (12.5 mg total) by mouth daily. 90 capsule 3  . HYDROcodone-acetaminophen (NORCO/VICODIN) 5-325 MG tablet Take 1 tablet by mouth every 6 (six) hours as needed. 10 tablet 0  . potassium chloride SA (KLOR-CON) 20 MEQ tablet Take 1 tablet (20 mEq total) by mouth daily. 90 tablet 3  . predniSONE (DELTASONE) 5 MG tablet Take 2 tablets (10 mg total) by mouth daily with breakfast. 60 tablet 2  . solifenacin (VESICARE) 10 MG tablet Take 1 tablet (10 mg total) by mouth daily. 90 tablet 1   No current facility-administered medications on file prior to visit.   No Known Allergies Social History   Socioeconomic History  . Marital status: Married    Spouse name: Not on file  . Number of children: 1  . Years of education: Not on file  . Highest education level: Not on file  Occupational History  . Not on file  Tobacco Use  . Smoking status: Former Smoker    Packs/day: 0.75    Years: 15.00    Pack years: 11.25    Types: Cigarettes    Quit date: 03/20/2014    Years since quitting: 6.5  . Smokeless tobacco: Former Leisure centre manager  date: 03/11/2014  Vaping Use  . Vaping Use: Never used  Substance and Sexual Activity  . Alcohol use: No    Alcohol/week: 0.0 standard drinks  . Drug use: No  . Sexual activity: Not on file  Other Topics Concern  . Not on file  Social History Narrative  . Not on file   Social Determinants of Health   Financial Resource Strain: Not on file  Food Insecurity: Not on file  Transportation Needs: Not on file  Physical Activity: Not on file  Stress: Not on file  Social Connections: Not on file  Intimate Partner Violence: Not on file     Review of Systems  All other systems reviewed and are negative.      Objective:   Physical Exam Constitutional:      General: She is not in acute distress.    Appearance: Normal appearance. She is not  toxic-appearing or diaphoretic.  Cardiovascular:     Rate and Rhythm: Normal rate and regular rhythm.     Heart sounds: Normal heart sounds. No murmur heard. No friction rub. No gallop.   Pulmonary:     Effort: Pulmonary effort is normal. No respiratory distress.     Breath sounds: Decreased air movement present. Wheezing present. No rales.  Abdominal:     General: Abdomen is flat. Bowel sounds are normal.     Palpations: Abdomen is soft.  Musculoskeletal:     Right lower leg: No edema.     Left lower leg: No edema.  Neurological:     Mental Status: She is alert.          Assessment & Plan:  Chronic obstructive pulmonary disease, unspecified COPD type ([place]) - Plan: albuterol (VENTOLIN HFA) 108 (90 Base) MCG/ACT inhaler  COPD exacerbation ([place]) - Plan: budesonide-formoterol (SYMBICORT) 160-4.5 MCG/ACT inhaler  Begin prednisone 40 mg a day for 7 days coupled with a Z-Pak.  Use albuterol 2 puffs every 4-6 hours as needed.  I refilled her albuterol.  I also refilled her Symbicort.  She had managed to wean herself off prednisone before this most recent attack.  Hopefully we can get back to that.

## 2020-10-21 ENCOUNTER — Ambulatory Visit: Payer: Medicare Other | Admitting: Family Medicine

## 2020-11-06 ENCOUNTER — Encounter: Payer: Self-pay | Admitting: Internal Medicine

## 2020-11-06 ENCOUNTER — Other Ambulatory Visit: Payer: Self-pay

## 2020-11-06 ENCOUNTER — Ambulatory Visit: Payer: Medicare Other | Admitting: Internal Medicine

## 2020-11-06 VITALS — BP 110/68 | HR 69 | Temp 97.4°F | Ht 67.0 in | Wt 106.0 lb

## 2020-11-06 DIAGNOSIS — R0600 Dyspnea, unspecified: Secondary | ICD-10-CM | POA: Diagnosis not present

## 2020-11-06 DIAGNOSIS — R5381 Other malaise: Secondary | ICD-10-CM | POA: Diagnosis not present

## 2020-11-06 DIAGNOSIS — J449 Chronic obstructive pulmonary disease, unspecified: Secondary | ICD-10-CM

## 2020-11-06 DIAGNOSIS — J9611 Chronic respiratory failure with hypoxia: Secondary | ICD-10-CM

## 2020-11-06 DIAGNOSIS — R64 Cachexia: Secondary | ICD-10-CM

## 2020-11-06 DIAGNOSIS — R0609 Other forms of dyspnea: Secondary | ICD-10-CM

## 2020-11-06 MED ORDER — BREZTRI AEROSPHERE 160-9-4.8 MCG/ACT IN AERO
2.0000 | INHALATION_SPRAY | Freq: Two times a day (BID) | RESPIRATORY_TRACT | 0 refills | Status: DC
Start: 2020-11-06 — End: 2020-11-27

## 2020-11-06 NOTE — Addendum Note (Signed)
Addended by: Lorretta Harp on: 11/06/2020 03:09 PM   Modules accepted: Orders

## 2020-11-06 NOTE — Progress Notes (Signed)
OV 11/06/2020  Subjective:  Patient ID: Diane Patterson, female , DOB: [birth year] , age 75 y.o. , MRN: [id number] , ADDRESS: [place] [id number] PCP Diane Frizzle, MD Patient Care Team: Diane Frizzle, MD as PCP - General (Family Medicine) Diane Pain, MD as PCP - Cardiology (Cardiology)  This Provider for this visit: Treatment Team:  Attending Provider: Brand Males, MD    11/06/2020 -   Chief Complaint  Patient presents with  . New Patient (Initial Visit)    SOB with activity   Transfer of care from Diane Patterson to Diane Patterson  HPI Diane Patterson 75 y.o. -returns for follow-up.  She presents with her niece who is relocated from [place] to live with the patient.  Patient's healthcare power of attorney is her son who lives 2 blocks away and the niece is second in line for healthcare power of attorney.  Patient has a longstanding history of COPD.  For the last 1 year she has been on oxygen 2 L nasal cannula.  In 2019/09/10 her husband passed away from advanced COPD.  This was after hospitalization.  At this point in time she is on Symbicort daily and oxygen 2 L nasal cannula.  She had recent echocardiogram in the last 6 or 7 months which is normal.  She says currently she is suffering from significant physical deconditioning and frailty.  She has difficulty taking a shower.  She is able to walk room to room.  She is able to self-care but a lot of shortness of breath.  She is unable to fix her own meals.  She states the goal would be to be able to walk 2 blocks to her son's home.  She notes that the only thing with advanced disease is to improve her quality of life.  She would like to get recondition.  She has had 2 exacerbations of 4.  She has been on prednisone 2 times this year per her history.  Most recently has review of the chart shows 10/20/2020 prednisone given by the primary care physician.  Review of the labs indicate data  from below.  She has not had a CT chest in a while.  I do not see a blood gas but she tells me BiPAP was tried at home several months ago and she could not tolerate it.  She states this was her COPD.  Also of note: She is using oxygen concentrator at night at home.  For the daytime she has a portable system that belonged to her late husband.  It is not working well and she wants to change this.  She says she cannot afford a lighter portable system paying out-of-pocket.  PFT2009, shows very severe obstruction with an FEV1 of approximately 0.73 L, 27%.  September 10, 2014 FEV1 32%.  CXR 09/09/2020  Narrative & Impression  CLINICAL DATA:  Short of breath  EXAM: CHEST - 2 VIEW  COMPARISON:  06/04/2020  FINDINGS: Frontal and lateral views of the chest demonstrate a stable cardiac silhouette. Emphysema and hyperinflation again noted, without significant change since prior study. No acute airspace disease, effusion, or pneumothorax. Chronic wedge compression deformity at the T12 level. No acute bony abnormalities.  IMPRESSION: 1. Stable emphysema.  No acute process.   Electronically Signed   By: Diane Patterson M.D.   On: 08/24/2020 23:57    Feb 2017 Nuc Med Stress Test   Notes Recorded by Diane Patterson  Diane Porch, MD on 08/13/2015 at 6:34 PM 1. This study was count-poor at rest and with Lexiscan stress. There is a fixed, small, mild apical perfusion defect with normal wall motion that is likely artifact. No definite ischemia or infarction.  2. Normal EF and wall motion.  3. Low risk study.   [place], [place], MD   ECHO sept 2021    1. Left ventricular ejection fraction, by estimation, is 60 to 65%. The  left ventricle has normal function. The left ventricle has no regional  wall motion abnormalities. There is mild concentric left ventricular  hypertrophy. Left ventricular diastolic  parameters are consistent with Grade II diastolic dysfunction  (pseudonormalization).  Elevated left atrial pressure.  2. Right ventricular systolic function is normal. The right ventricular  size is normal.  3. The mitral valve is normal in structure. Mild mitral valve  regurgitation. No evidence of mitral stenosis.  4. The aortic valve is normal in structure. Aortic valve regurgitation is  not visualized. No aortic stenosis is present.  5. The inferior vena cava is dilated in size with >50% respiratory  variability, suggesting right atrial pressure of 8 mmHg.   Comparison(s): No significant change from prior study. Prior images  reviewed side by side.   PFT  No flowsheet data found.  Results for Diane, Patterson (MRN [id number]) as of 11/06/2020 10:50  Ref. Range 04/04/2020 11:20 06/04/2020 04:00 07/16/2020 16:34 07/22/2020 16:21 08/24/2020 23:35  Hemoglobin Latest Ref Range: 12.0 - 15.0 g/dL 12.4 11.7 (L) 13.1 11.3 (L) 10.6 (L)   Results for Diane, Patterson (MRN [id number]) as of 11/06/2020 10:50  Ref. Range 04/04/2020 11:20 06/04/2020 04:00 07/16/2020 16:34 08/24/2020 23:35 09/08/2020 13:02  Creatinine Latest Ref Range: 0.44 - 1.00 mg/dL 0.86 0.81 0.92 0.81     has a past medical history of Asthma, Chronic airflow obstruction ([place]), Hypertension, Hypotension, PAF (paroxysmal atrial fibrillation) ([place]), Tobacco abuse, and Venous insufficiency.   reports that she quit smoking about 6 years ago. Her smoking use included cigarettes. She has a 11.25 pack-year smoking history. She quit smokeless tobacco use about 6 years ago.  Past Surgical History:  Procedure Laterality Date  . cataract    . NECK SURGERY    . no colonoscopy     "afraid to "; [facility] reviewed  . VESICOVAGINAL FISTULA CLOSURE W/ TAH      No Known Allergies  Immunization History  Administered Date(s) Administered  . Fluad Quad(high Dose 65+) 02/21/2019, 03/14/2020  . Influenza Split 03/15/2011, 03/06/2012, 02/08/2019  . Influenza Whole 03/12/2009, 02/10/2010  . Influenza, High Dose Seasonal PF  04/01/2015, 03/05/2016, 03/18/2017, 04/02/2018  . Influenza,inj,Quad PF,6+ Mos 03/30/2013, 03/11/2014, 03/19/2017  . Moderna Sars-Covid-2 Vaccination 09/13/2019, 10/16/2019  . Pneumococcal Conjugate-13 11/14/2013  . Pneumococcal Polysaccharide-23 09/11/2010    Family History  Problem Relation Age of Onset  . Diabetes Mother   . Emphysema Father   . Asthma Father   . Heart disease Father      Current Outpatient Medications:  .  albuterol (VENTOLIN HFA) 108 (90 Base) MCG/ACT inhaler, Inhale 2 puffs into the lungs every 4 (four) hours as needed for wheezing or shortness of breath., Disp: 18 g, Rfl: 3 .  apixaban (ELIQUIS) 5 MG TABS tablet, Take 1 tablet (5 mg total) by mouth 2 (two) times daily., Disp: 180 tablet, Rfl: 1 .  azithromycin (ZITHROMAX) 250 MG tablet, 2 tabs poqday1, 1 tab poqday 2-5, Disp: 6 tablet, Rfl: 0 .  budesonide-formoterol (SYMBICORT) 160-4.5 MCG/ACT inhaler, INHALE  2 PUFFS INTO LUNGS TWICE DAILY, Disp: 1 each, Rfl: 11 .  chlorpheniramine-HYDROcodone (TUSSIONEX PENNKINETIC ER) 10-8 MG/5ML SUER, Take 5 mLs by mouth every 12 (twelve) hours as needed for cough., Disp: 115 mL, Rfl: 0 .  diltiazem (CARDIZEM CD) 240 MG 24 hr capsule, TAKE 1 CAPSULE BY MOUTH EVERY DAY, Disp: 90 capsule, Rfl: 3 .  HYDROcodone-acetaminophen (NORCO/VICODIN) 5-325 MG tablet, Take 1 tablet by mouth every 6 (six) hours as needed., Disp: 10 tablet, Rfl: 0 .  potassium chloride SA (KLOR-CON) 20 MEQ tablet, Take 1 tablet (20 mEq total) by mouth daily., Disp: 90 tablet, Rfl: 3 .  predniSONE (DELTASONE) 20 MG tablet, Take 2 tablets (40 mg total) by mouth daily with breakfast., Disp: 14 tablet, Rfl: 0 .  solifenacin (VESICARE) 10 MG tablet, Take 1 tablet (10 mg total) by mouth daily., Disp: 90 tablet, Rfl: 1 .  fluticasone (FLONASE) 50 MCG/ACT nasal spray, Place 2 sprays into both nostrils daily. (Patient not taking: Reported on 11/06/2020), Disp: 16 g, Rfl: 6 .  guaiFENesin (ROBITUSSIN) 100 MG/5ML SOLN,  Take 5 mLs (100 mg total) by mouth every 4 (four) hours as needed for cough or to loosen phlegm. (Patient not taking: Reported on 11/06/2020), Disp: 236 mL, Rfl: 0 .  hydrochlorothiazide (MICROZIDE) 12.5 MG capsule, Take 1 capsule (12.5 mg total) by mouth daily., Disp: 90 capsule, Rfl: 3      Objective:   Vitals:   11/06/20 1045 11/06/20 1048  BP: 110/68   Pulse: 69   Temp: (!) 97.4 F (36.3 C)   TempSrc: Temporal   SpO2:  99%  Weight: 106 lb (48.1 kg)   Height: 5\' 7"  (1.702 m)     Estimated body mass index is 16.6 kg/m as calculated from the following:   Height as of this encounter: 5\' 7"  (1.702 m).   Weight as of this encounter: 106 lb (48.1 kg).  @WEIGHTCHANGE @  [employer]   11/06/20 1045  Weight: 106 lb (48.1 kg)     Physical Exam Extremely frail cachectic female sitting in the wheelchair.  Pursed lip breathing oxygen on.  Barrel chested no wheeze.  Distant air entry normal heart sounds abdomen soft.  Right lower extremity shin area from previous hematoma with some scaling and healing.  But no obvious pedal edema.  No stigmata of connective tissue disease alert and oriented x3.     Assessment:       ICD-10-CM   1. Chronic respiratory failure with hypoxia (HCC)  J96.11   2. Stage 4 very severe COPD by GOLD classification ([place])  J44.9   3. Dyspnea on exertion  R06.00   4. Physical deconditioning  R53.81   5. Cachexia ([place])  R64        Plan:     Patient Instructions     ICD-10-CM   1. Chronic respiratory failure with hypoxia (HCC)  J96.11   2. Stage 4 very severe COPD by GOLD classification ([place])  J44.9   3. Dyspnea on exertion  R06.00   4. Physical deconditioning  R53.81   5. Cachexia ([place])  R64    You have very advanced COPD Gold stage IV The poor quality of life and physical deconditioning is all from COPD Goal is to try to improve quality of life to the extent possible  Okay I am printing on Frankey Poot plan  =-Stop Symbicort.  Start BREZTRI  2 puff 2 times daily -Continue albuterol as needed -In the future we will discuss Daliresp to  prevent COPD exacerbation -Continue 2 L nasal cannula 24/7 -Refer home physical therapy -CMA to contact DME company and get you a better portable oxygen system [your current one is your late husband's and it is not working well] -Continue other medications  -Do blood test for alpha-1 antitrypsin phenotype -Do blood test for QuantiFERON gold -Do high-resolution CT chest supine and prone -Do arterial blood gas  Follow-up - Next few weeks with Diane Patterson or nurse practitioner - at some ponit discuss more goals of care     SIGNATURE    Dr. Brand Patterson, M.D., F.C.C.P,  Pulmonary and Critical Care Medicine Staff Physician, [place] Director - Interstitial Lung Disease  Program  Pulmonary [place] [facility] [place], [place], [address]  Pager: [phone], If no answer or between  15:00h - 7:00h: call [phone] Telephone: [phone]  11:18 AM 11/06/2020

## 2020-11-06 NOTE — Patient Instructions (Addendum)
ICD-10-CM   1. Chronic respiratory failure with hypoxia (HCC)  J96.11   2. Stage 4 very severe COPD by GOLD classification ([place])  J44.9   3. Dyspnea on exertion  R06.00   4. Physical deconditioning  R53.81   5. Cachexia ([place])  R64    You have very advanced COPD Gold stage IV The poor quality of life and physical deconditioning is all from COPD Goal is to try to improve quality of life to the extent possible  Okay I am printing on Frankey Poot plan  =-Stop Symbicort.  Start BREZTRI 2 puff 2 times daily -Continue albuterol as needed -In the future we will discuss Daliresp to prevent COPD exacerbation -Continue 2 L nasal cannula 24/7 -Refer home physical therapy -CMA to contact DME company and get you a better portable oxygen system [your current one is your late husband's and it is not working well] -Continue other medications  -Do blood test for alpha-1 antitrypsin phenotype -Do blood test for QuantiFERON gold -Do high-resolution CT chest supine and prone -Do arterial blood gas  Follow-up - Next few weeks with Dr. Chase Caller or nurse practitioner - at some ponit discuss more goals of care

## 2020-11-07 ENCOUNTER — Telehealth: Payer: Self-pay | Admitting: Internal Medicine

## 2020-11-07 ENCOUNTER — Other Ambulatory Visit: Payer: Self-pay | Admitting: *Deleted

## 2020-11-07 MED ORDER — HYDROCOD POLST-CPM POLST ER 10-8 MG/5ML PO SUER
5.0000 mL | Freq: Two times a day (BID) | ORAL | 0 refills | Status: DC | PRN
Start: 2020-11-07 — End: 2020-11-20

## 2020-11-07 NOTE — Telephone Encounter (Signed)
Received call from patient.   Reports that she continues to have cough and requested refill on Tussionex. Of note, she accidentally dropped bottle in commode while open.

## 2020-11-07 NOTE — Telephone Encounter (Signed)
Called and spoke with Constance Haw, [facility].  Diane Patterson stated she completed home PT eval today.  Constance Haw is requesting verbal for home nursing eval to check Patient buttocks area redness, occupational therapy to help with self care, bathing.  Message routed to Dr. Chase Caller to advise

## 2020-11-11 ENCOUNTER — Other Ambulatory Visit: Payer: Medicare Other

## 2020-11-11 NOTE — Telephone Encounter (Signed)
This is fine 

## 2020-11-11 NOTE — Telephone Encounter (Signed)
Called and spoke with Diane Patterson. She verbalized understanding of the verbal order.   Nothing further needed at time of call.

## 2020-11-19 ENCOUNTER — Encounter: Payer: Self-pay | Admitting: Podiatry

## 2020-11-19 ENCOUNTER — Other Ambulatory Visit: Payer: Self-pay

## 2020-11-19 ENCOUNTER — Ambulatory Visit: Payer: Medicare Other | Admitting: Podiatry

## 2020-11-19 DIAGNOSIS — N179 Acute kidney failure, unspecified: Secondary | ICD-10-CM

## 2020-11-19 DIAGNOSIS — L989 Disorder of the skin and subcutaneous tissue, unspecified: Secondary | ICD-10-CM | POA: Diagnosis not present

## 2020-11-19 DIAGNOSIS — M79675 Pain in left toe(s): Secondary | ICD-10-CM

## 2020-11-19 DIAGNOSIS — E119 Type 2 diabetes mellitus without complications: Secondary | ICD-10-CM

## 2020-11-19 DIAGNOSIS — B351 Tinea unguium: Secondary | ICD-10-CM | POA: Diagnosis not present

## 2020-11-19 DIAGNOSIS — M79674 Pain in right toe(s): Secondary | ICD-10-CM

## 2020-11-19 DIAGNOSIS — D689 Coagulation defect, unspecified: Secondary | ICD-10-CM

## 2020-11-19 NOTE — Progress Notes (Signed)
This patient returns to my office for at risk foot care.  This patient requires this care by a professional since this patient will be at risk due to having coagulation defect, type 2 diabetes and venous insufficiency.  Patient is taking eliquis.   She says she developed thick mycotic nails and fungal infection to her feet after going to a nail salon.  She presents to the office in a wheelchair and with a caregiver.  This patient is unable to cut nails herself since the patient cannot reach her nails.These nails are painful walking and wearing shoes.  This patient presents for at risk foot care today.  General Appearance  Alert, conversant and in no acute stress.  Vascular  Dorsalis pedis and posterior tibial  pulses are weakly  palpable  bilaterally.  Capillary return is within normal limits  bilaterally. Cold feet  B/L.  Absent digital hair  B/L.   Neurologic  Senn-Weinstein monofilament wire test within normal limits  bilaterally. Muscle power within normal limits bilaterally.  Nails Thick disfigured discolored nails with subungual debris  from hallux to fifth toes bilaterally. No evidence of bacterial infection or drainage bilaterally.  Orthopedic  No limitations of motion  feet .  No crepitus or effusions noted.  No bony pathology or digital deformities noted.  Skin  Peeling  skin with no porokeratosis noted bilaterally.  Peeling is present on her toes  B/L.  Red inflamed skin with peeling noted on the lateral aspect left rearfoot.    No signs of infections or ulcers noted.     Onychomycosis  Pain in right toes  Pain in left toes  Consent was obtained for treatment procedures.   Mechanical debridement of nails 1-5  bilaterally performed with a nail nipper.  Filed with dremel without incident.    Return office visit    10 weeks                 Told patient to return for periodic foot care and evaluation due to potential at risk complications.   Gardiner Barefoot DPM

## 2020-11-20 ENCOUNTER — Telehealth (INDEPENDENT_AMBULATORY_CARE_PROVIDER_SITE_OTHER): Payer: Medicare Other | Admitting: Nurse Practitioner

## 2020-11-20 ENCOUNTER — Encounter: Payer: Self-pay | Admitting: Nurse Practitioner

## 2020-11-20 VITALS — HR 94

## 2020-11-20 DIAGNOSIS — J441 Chronic obstructive pulmonary disease with (acute) exacerbation: Secondary | ICD-10-CM | POA: Diagnosis not present

## 2020-11-20 DIAGNOSIS — K5900 Constipation, unspecified: Secondary | ICD-10-CM

## 2020-11-20 MED ORDER — PREDNISONE 20 MG PO TABS: 40.0000 mg | ORAL_TABLET | Freq: Every day | ORAL | 0 refills | Status: DC

## 2020-11-20 MED ORDER — GUAIFENESIN 100 MG/5ML PO SOLN: 5.0000 mL | ORAL | 0 refills | Status: AC | PRN

## 2020-11-20 MED ORDER — BISACODYL EC 5 MG PO TBEC
5.0000 mg | DELAYED_RELEASE_TABLET | Freq: Every day | ORAL | 0 refills | Status: AC | PRN
Start: 2020-11-20 — End: ?

## 2020-11-20 MED ORDER — AZITHROMYCIN 250 MG PO TABS: ORAL_TABLET | ORAL | 0 refills | Status: DC

## 2020-11-20 NOTE — Progress Notes (Signed)
Subjective:    Patient ID: Diane Patterson, female    DOB: [birth year], 75 y.o.   MRN: [id number]  HPI: Diane Patterson is a 75 y.o. female presenting virtually for asthma exacerbation.  Chief Complaint  Patient presents with   Wheezing    4 days of wheezing, asking for antibx, symbicort and refill on albuterol. Denies any other sx.   UPPER RESPIRATORY TRACT INFECTION Patient saw pulmonologist at the end of May and Symbicort was changed to [facility]. Onset: 3-4 days ago COVID-19 testing history: not tested this occurrence COVID-19 vaccination status: 2 COVID vaccines; no booster Fever: no Cough: yes; congested Shortness of breath: yes  - using albuterol every 4 hours as needed Wheezing: yes - using albuterol every 4 hours Chest pain: no Chest tightness: yes Chest congestion: yes Nasal congestion: no Runny nose: yes Post nasal drip: no Sneezing: no Sore throat: no Swollen glands: no Sinus pressure: no Headache: no Face pain: no Toothache: yes Ear pain: no  Ear pressure: no  Eyes red/itching:no Eye drainage/crusting: no  Nausea: no  Vomiting: no Diarrhea: no  Constipated: yes Change in appetite: no  Loss of taste/smell: no  Rash: no Fatigue: yes Weakness: yes Sick contacts: no Strep contacts: no  Context: worse Recurrent sinusitis: no Treatments attempted: Symbicort, inhaler Relief with OTC medications: none taken  No Known Allergies  Outpatient Encounter Medications as of 11/20/2020  Medication Sig   albuterol (VENTOLIN HFA) 108 (90 Base) MCG/ACT inhaler Inhale 2 puffs into the lungs every 4 (four) hours as needed for wheezing or shortness of breath.   apixaban (ELIQUIS) 5 MG TABS tablet Take 1 tablet (5 mg total) by mouth 2 (two) times daily.   bisacodyl 5 MG EC tablet Take 1 tablet (5 mg total) by mouth daily as needed for moderate constipation.   Budeson-Glycopyrrol-Formoterol (BREZTRI AEROSPHERE) 160-9-4.8 MCG/ACT AERO Inhale 2 puffs into the  lungs in the morning and at bedtime.   diltiazem (CARDIZEM CD) 240 MG 24 hr capsule TAKE 1 CAPSULE BY MOUTH EVERY DAY   HYDROcodone-acetaminophen (NORCO/VICODIN) 5-325 MG tablet Take 1 tablet by mouth every 6 (six) hours as needed.   potassium chloride SA (KLOR-CON) 20 MEQ tablet Take 1 tablet (20 mEq total) by mouth daily.   solifenacin (VESICARE) 10 MG tablet Take 1 tablet (10 mg total) by mouth daily.   [DISCONTINUED] azithromycin (ZITHROMAX) 250 MG tablet 2 tabs poqday1, 1 tab poqday 2-5   [DISCONTINUED] guaiFENesin (ROBITUSSIN) 100 MG/5ML SOLN Take 5 mLs (100 mg total) by mouth every 4 (four) hours as needed for cough or to loosen phlegm.   azithromycin (ZITHROMAX) 250 MG tablet 2 tabs poqday1, 1 tab poqday 2-5   guaiFENesin (ROBITUSSIN) 100 MG/5ML SOLN Take 5 mLs (100 mg total) by mouth every 4 (four) hours as needed for cough or to loosen phlegm.   hydrochlorothiazide (MICROZIDE) 12.5 MG capsule Take 1 capsule (12.5 mg total) by mouth daily.   predniSONE (DELTASONE) 20 MG tablet Take 2 tablets (40 mg total) by mouth daily with breakfast.   [DISCONTINUED] chlorpheniramine-HYDROcodone (TUSSIONEX PENNKINETIC ER) 10-8 MG/5ML SUER Take 5 mLs by mouth every 12 (twelve) hours as needed for cough.   [DISCONTINUED] fluticasone (FLONASE) 50 MCG/ACT nasal spray Place 2 sprays into both nostrils daily. (Patient not taking: Reported on 11/06/2020)   [DISCONTINUED] predniSONE (DELTASONE) 20 MG tablet Take 2 tablets (40 mg total) by mouth daily with breakfast.   No facility-administered encounter medications on file as of 11/20/2020.  Patient Active Problem List   Diagnosis Date Noted   Coagulation defect ([place]) 11/19/2020   Gait instability 05/27/2020   Paroxysmal A-fib ([place]) 03/03/2020   Diabetes mellitus type 2, uncomplicated ([place]) 09/81/1914   Atrial fibrillation with RVR ([place])    AKI (acute kidney injury) ([place]) 07/11/2017   Chronic respiratory failure with hypoxia ([place]) 11/15/2016   Allergic  rhinitis 10/14/2016   Contusion 12/01/2015   Abscess of umbilicus 78/29/5621   COPD exacerbation ([place]) 06/23/2015   Right carotid bruit 08/19/2014   Hyperlipidemia 08/19/2014   Prediabetes 08/19/2014   Bruising 09/26/2013   History of basal cell cancer 09/26/2013   Oral candidiasis 01/10/2013   Essential hypertension 12/21/2011   COPD (chronic obstructive pulmonary disease) ([place]) 01/20/2011   Constipation 06/24/2010   ABDOMINAL BLOATING 06/24/2010   ATHEROSCLEROSIS OF AORTA 02/10/2010   FATTY LIVER DISEASE 02/10/2010   Anxiety state 05/14/2008   Former smoker 12/28/2006   VENOUS INSUFFICIENCY 12/28/2006    Past Medical History:  Diagnosis Date   Asthma    Chronic airflow obstruction (HCC)    Hypertension    Hypotension    PAF (paroxysmal atrial fibrillation) (HCC)    Tobacco abuse    Venous insufficiency     Relevant past medical, surgical, family and social history reviewed and updated as indicated. Interim medical history since our last visit reviewed.  Review of Systems Per HPI unless specifically indicated above     Objective:    Pulse 94   LMP  (LMP Unknown)   SpO2 98%   Wt Readings from Last 3 Encounters:  11/06/20 106 lb (48.1 kg)  08/24/20 102 lb (46.3 kg)  08/04/20 102 lb (46.3 kg)    Physical Exam Vitals and nursing note reviewed.  Constitutional:      General: She is not in acute distress.    Appearance: Normal appearance. She is not toxic-appearing.  HENT:     Head: Normocephalic and atraumatic.  Cardiovascular:     Rate and Rhythm: Normal rate.  Pulmonary:     Effort: Pulmonary effort is normal. No respiratory distress.     Comments: Unable to assess breath sounds via virtual visit.  Patient talking in complete sentences with occasional congested sounding cough.  No accessory muscle use.  Musculoskeletal:     Cervical back: Normal range of motion.  Skin:    Coloration: Skin is not jaundiced or pale.     Findings: No erythema.   Neurological:     Mental Status: She is alert and oriented to person, place, and time.  Psychiatric:        Mood and Affect: Mood normal.        Behavior: Behavior normal.        Thought Content: Thought content normal.        Judgment: Judgment normal.      Assessment & Plan:  1. COPD exacerbation ([place]) Acute.  Last COPD exacerbation 1 month ago.  Due for f/u with Pulmonologist given recent adjustment in inhaler regimen.  Encouraged COVID/respiratory testing.  Start azithromycin, prednisone, and expectorant.  Patient stable via video today - adequate oxygen and HR.  Follow up virtual visit Monday given constraints with transportation.  With any worsening symptoms, go to ER.  - azithromycin (ZITHROMAX) 250 MG tablet; 2 tabs poqday1, 1 tab poqday 2-5  Dispense: 6 tablet; Refill: 0 - guaiFENesin (ROBITUSSIN) 100 MG/5ML SOLN; Take 5 mLs (100 mg total) by mouth every 4 (four) hours as needed for cough or to loosen  phlegm.  Dispense: 236 mL; Refill: 0 - predniSONE (DELTASONE) 20 MG tablet; Take 2 tablets (40 mg total) by mouth daily with breakfast.  Dispense: 14 tablet; Refill: 0 - SARS-CoV-2 RNA (COVID-19) and Respiratory Viral Panel, Qualitative NAAT  2. Constipation, unspecified constipation type Acute.  Start ducolax daily as needed.  Notify if this does not help.  - bisacodyl 5 MG EC tablet; Take 1 tablet (5 mg total) by mouth daily as needed for moderate constipation.  Dispense: 30 tablet; Refill: 0     Follow up plan: Return in about 3 days (around 11/23/2020).   Due to the catastrophic nature of the COVID-19 pandemic, this video visit was completed soley via audio and visual contact via [facility] due to the restrictions of the COVID-19 pandemic.  All issues as above were discussed and addressed. Physical exam was done as above through visual confirmation on Careg[employer]. If it was felt that the patient should be evaluated in the office, they were directed there. The patient verbally  consented to this visit. Location of the patient: home Location of the provider: work Those involved with this call:  Provider: Noemi Chapel, DNP, FNP-C CMA: Annabelle Harman, CMA Front Desk/Registration: Vevelyn Pat  Time spent on call:  13 minutes on the phone discussing health concerns. 15 minutes total spent in review of patient's record and preparation of their chart. I verified patient identity using two factors (patient name and date of birth). Patient consents verbally to being seen via telemedicine visit today.

## 2020-11-21 ENCOUNTER — Encounter (HOSPITAL_COMMUNITY): Payer: Medicare Other

## 2020-11-24 ENCOUNTER — Other Ambulatory Visit: Payer: Self-pay | Admitting: *Deleted

## 2020-11-24 DIAGNOSIS — J449 Chronic obstructive pulmonary disease, unspecified: Secondary | ICD-10-CM

## 2020-11-27 ENCOUNTER — Other Ambulatory Visit: Payer: Self-pay

## 2020-11-27 ENCOUNTER — Ambulatory Visit (INDEPENDENT_AMBULATORY_CARE_PROVIDER_SITE_OTHER): Payer: Medicare Other | Admitting: Family Medicine

## 2020-11-27 VITALS — BP 110/78 | HR 88 | Temp 98.1°F | Resp 16

## 2020-11-27 DIAGNOSIS — J441 Chronic obstructive pulmonary disease with (acute) exacerbation: Secondary | ICD-10-CM

## 2020-11-27 MED ORDER — PREDNISONE 20 MG PO TABS: 40.0000 mg | ORAL_TABLET | Freq: Every day | ORAL | 0 refills | Status: AC

## 2020-11-27 NOTE — Progress Notes (Signed)
Subjective:    Patient ID: Diane Patterson, female    DOB: August 20, 1945, 75 y.o.   MRN: [id number]  Patient reports increasing shortness of breath.  She is wheezing more.  This is been going on for the last 3 to 4 days.  It is gotten particularly worse ever since the weather got hot and is humid as it is right now.  She reports white sputum.  She denies any fevers or chills or chest pain.  She denies any hemoptysis.  On examination today she has diminished breath sounds all throughout with faint expiratory wheezing in all 4 lung fields.  However her air movement is noticeably decreased from her baseline.  She states she is been using her albuterol every 4 hours in addition to her Symbicort Past Medical History:  Diagnosis Date   Asthma    Chronic airflow obstruction (HCC)    Hypertension    Hypotension    PAF (paroxysmal atrial fibrillation) (HCC)    Tobacco abuse    Venous insufficiency    Past Surgical History:  Procedure Laterality Date   cataract     NECK SURGERY     no colonoscopy     "afraid to "; SOC reviewed   VESICOVAGINAL FISTULA CLOSURE W/ TAH     Current Outpatient Medications on File Prior to Visit  Medication Sig Dispense Refill   albuterol (VENTOLIN HFA) 108 (90 Base) MCG/ACT inhaler Inhale 2 puffs into the lungs every 4 (four) hours as needed for wheezing or shortness of breath. 18 g 3   apixaban (ELIQUIS) 5 MG TABS tablet Take 1 tablet (5 mg total) by mouth 2 (two) times daily. 180 tablet 1   bisacodyl 5 MG EC tablet Take 1 tablet (5 mg total) by mouth daily as needed for moderate constipation. 30 tablet 0   budesonide-formoterol (SYMBICORT) 160-4.5 MCG/ACT inhaler Inhale 2 puffs into the lungs 2 (two) times daily.     diltiazem (CARDIZEM CD) 240 MG 24 hr capsule TAKE 1 CAPSULE BY MOUTH EVERY DAY 90 capsule 3   guaiFENesin (ROBITUSSIN) 100 MG/5ML SOLN Take 5 mLs (100 mg total) by mouth every 4 (four) hours as needed for cough or to loosen phlegm. 236 mL 0    hydrochlorothiazide (MICROZIDE) 12.5 MG capsule Take 1 capsule (12.5 mg total) by mouth daily. 90 capsule 3   potassium chloride SA (KLOR-CON) 20 MEQ tablet Take 1 tablet (20 mEq total) by mouth daily. 90 tablet 3   solifenacin (VESICARE) 10 MG tablet Take 1 tablet (10 mg total) by mouth daily. 90 tablet 1   No current facility-administered medications on file prior to visit.   No Known Allergies Social History   Socioeconomic History   Marital status: Married    Spouse name: Not on file   Number of children: 1   Years of education: Not on file   Highest education level: Not on file  Occupational History   Not on file  Tobacco Use   Smoking status: Former    Packs/day: 0.75    Years: 15.00    Pack years: 11.25    Types: Cigarettes    Quit date: 03/20/2014    Years since quitting: 6.6   Smokeless tobacco: Former    Quit date: 03/11/2014  Vaping Use   Vaping Use: Never used  Substance and Sexual Activity   Alcohol use: No    Alcohol/week: 0.0 standard drinks   Drug use: No   Sexual activity: Not on file  Other  Topics Concern   Not on file  Social History Narrative   Not on file   Social Determinants of Health   Financial Resource Strain: Not on file  Food Insecurity: Not on file  Transportation Needs: Not on file  Physical Activity: Not on file  Stress: Not on file  Social Connections: Not on file  Intimate Partner Violence: Not on file     Review of Systems  All other systems reviewed and are negative.     Objective:   Physical Exam Constitutional:      General: She is not in acute distress.    Appearance: Normal appearance. She is not toxic-appearing or diaphoretic.  Cardiovascular:     Rate and Rhythm: Normal rate and regular rhythm.     Heart sounds: Normal heart sounds. No murmur heard.   No friction rub. No gallop.  Pulmonary:     Effort: Pulmonary effort is normal. No respiratory distress.     Breath sounds: Decreased air movement present. Wheezing  present. No rales.  Abdominal:     General: Abdomen is flat. Bowel sounds are normal.     Palpations: Abdomen is soft.  Musculoskeletal:     Right lower leg: No edema.     Left lower leg: No edema.  Neurological:     Mental Status: She is alert.         Assessment & Plan:  COPD exacerbation (HCC)  Begin prednisone 40 mg a day for 7 days.  I also recommended switching Symbicort to Breztri 2 inhalations twice daily as I feel that this would do a better job of controlling and improving her emphysema per tickly during the hot summer months.  She continue to use albuterol every 6 hours as needed

## 2020-12-01 LAB — QUANTIFERON-TB GOLD PLUS
Mitogen-NIL: 3.01 IU/mL
NIL: 0.05 IU/mL
QuantiFERON-TB Gold Plus: NEGATIVE
TB1-NIL: <0 IU/mL
TB2-NIL: 0.01 IU/mL

## 2020-12-01 LAB — ALPHA-1 ANTITRYPSIN PHENOTYPE: A-1 Antitrypsin, Ser: 145 mg/dL (ref 83–199)

## 2020-12-02 ENCOUNTER — Other Ambulatory Visit: Payer: Self-pay

## 2020-12-02 ENCOUNTER — Encounter: Payer: Self-pay | Admitting: Family Medicine

## 2020-12-02 ENCOUNTER — Ambulatory Visit (INDEPENDENT_AMBULATORY_CARE_PROVIDER_SITE_OTHER): Payer: Medicare Other | Admitting: Family Medicine

## 2020-12-02 VITALS — BP 138/68 | HR 74 | Temp 98.7°F | Resp 18

## 2020-12-02 DIAGNOSIS — I739 Peripheral vascular disease, unspecified: Secondary | ICD-10-CM | POA: Diagnosis not present

## 2020-12-02 DIAGNOSIS — B370 Candidal stomatitis: Secondary | ICD-10-CM | POA: Diagnosis not present

## 2020-12-02 MED ORDER — NYSTATIN 100000 UNIT/ML MT SUSP: 5.0000 mL | Freq: Four times a day (QID) | OROMUCOSAL | 0 refills | Status: AC

## 2020-12-02 NOTE — Progress Notes (Signed)
Subjective:    Patient ID: Diane Patterson, female    DOB: 23-May-1946, 75 y.o.   MRN: [id number]  We received a call yesterday from a home health nurse concerned that the patient may have cellulitis.  The report they received stated that both legs were erythematous and red.  Therefore we worked the patient in today for evaluation.  Fortunately the legs are not erythematous.  There is no erythema from her knees to her ankles.  She does have +1 pitting edema in both feet from the ankles to the toes due to immobility.  However her toes are a dark purplish red.  This involves all the toes on her feet on both sides.  They have a delayed capillary refill of 7 seconds.  I believe this is likely due to peripheral vascular disease.  They are cool to the touch.  There is no skin breakdown.  There is no ulcers.  She denies any claudication in her lower extremities.  She denies any pain in her toes.  She is a very poor surgical candidate.  She does report a white exudate in her mouth.  There is visible plaques of thrush on her palate as well as on her tongue and on her buccal mucosa Past Medical History:  Diagnosis Date   Asthma    Chronic airflow obstruction (HCC)    Hypertension    Hypotension    PAF (paroxysmal atrial fibrillation) (HCC)    Tobacco abuse    Venous insufficiency    Past Surgical History:  Procedure Laterality Date   cataract     NECK SURGERY     no colonoscopy     "afraid to "; SOC reviewed   VESICOVAGINAL FISTULA CLOSURE W/ TAH     Current Outpatient Medications on File Prior to Visit  Medication Sig Dispense Refill   albuterol (VENTOLIN HFA) 108 (90 Base) MCG/ACT inhaler Inhale 2 puffs into the lungs every 4 (four) hours as needed for wheezing or shortness of breath. 18 g 3   apixaban (ELIQUIS) 5 MG TABS tablet Take 1 tablet (5 mg total) by mouth 2 (two) times daily. 180 tablet 1   bisacodyl 5 MG EC tablet Take 1 tablet (5 mg total) by mouth daily as needed for moderate  constipation. 30 tablet 0   Budeson-Glycopyrrol-Formoterol (BREZTRI AEROSPHERE) 160-9-4.8 MCG/ACT AERO Inhale into the lungs.     diltiazem (CARDIZEM CD) 240 MG 24 hr capsule TAKE 1 CAPSULE BY MOUTH EVERY DAY 90 capsule 3   guaiFENesin (ROBITUSSIN) 100 MG/5ML SOLN Take 5 mLs (100 mg total) by mouth every 4 (four) hours as needed for cough or to loosen phlegm. 236 mL 0   hydrochlorothiazide (MICROZIDE) 12.5 MG capsule Take 1 capsule (12.5 mg total) by mouth daily. 90 capsule 3   potassium chloride SA (KLOR-CON) 20 MEQ tablet Take 1 tablet (20 mEq total) by mouth daily. 90 tablet 3   predniSONE (DELTASONE) 20 MG tablet Take 2 tablets (40 mg total) by mouth daily with breakfast. 14 tablet 0   solifenacin (VESICARE) 10 MG tablet Take 1 tablet (10 mg total) by mouth daily. 90 tablet 1   No current facility-administered medications on file prior to visit.   No Known Allergies Social History   Socioeconomic History   Marital status: Married    Spouse name: Not on file   Number of children: 1   Years of education: Not on file   Highest education level: Not on file  Occupational History  Not on file  Tobacco Use   Smoking status: Former    Packs/day: 0.75    Years: 15.00    Pack years: 11.25    Types: Cigarettes    Quit date: 03/20/2014    Years since quitting: 6.7   Smokeless tobacco: Former    Quit date: 03/11/2014  Vaping Use   Vaping Use: Never used  Substance and Sexual Activity   Alcohol use: No    Alcohol/week: 0.0 standard drinks   Drug use: No   Sexual activity: Not on file  Other Topics Concern   Not on file  Social History Narrative   Not on file   Social Determinants of Health   Financial Resource Strain: Not on file  Food Insecurity: Not on file  Transportation Needs: Not on file  Physical Activity: Not on file  Stress: Not on file  Social Connections: Not on file  Intimate Partner Violence: Not on file     Review of Systems  All other systems reviewed and  are negative.     Objective:   Physical Exam Constitutional:      General: She is not in acute distress.    Appearance: Normal appearance. She is not toxic-appearing or diaphoretic.  HENT:     Mouth/Throat:     Mouth: Oral lesions (thrush) present.  Cardiovascular:     Rate and Rhythm: Normal rate and regular rhythm.     Pulses: Decreased pulses.          Dorsalis pedis pulses are 1+ on the right side and 1+ on the left side.     Heart sounds: Normal heart sounds. No murmur heard.   No friction rub. No gallop.     Comments: All the toes on both feet are purplish red with delayed capillary refill of 7 seconds suggesting microvascular peripheral vascular disease Pulmonary:     Effort: Pulmonary effort is normal. No respiratory distress.     Breath sounds: Decreased air movement present. Wheezing present. No rales.  Abdominal:     General: Abdomen is flat. Bowel sounds are normal.     Palpations: Abdomen is soft.  Musculoskeletal:     Right lower leg: No edema.     Left lower leg: No edema.  Neurological:     Mental Status: She is alert.         Assessment & Plan:  Thrush, oral  Asymptomatic PVD (peripheral vascular disease) ([place]) We discussed getting arterial Dopplers with TBI's and ABIs to evaluate further however she has no interest in surgery.  She has no claudication.  She has no pain.  She has no ulcers.  Therefore she does not require any type of surgical intervention.  We will monitor this clinically.  I believe the erythema in her toes is due to the peripheral vascular disease as well as pitting edema in her feet.  She will try to elevate the feet is much as possible to help with the pitting edema in her feet.  I will treat the thrush with nystatin 1 teaspoon 4 times daily for 1 week.

## 2020-12-03 ENCOUNTER — Telehealth: Payer: Self-pay | Admitting: *Deleted

## 2020-12-03 ENCOUNTER — Telehealth: Payer: Self-pay | Admitting: Internal Medicine

## 2020-12-03 ENCOUNTER — Telehealth: Payer: Self-pay

## 2020-12-03 DIAGNOSIS — J449 Chronic obstructive pulmonary disease, unspecified: Secondary | ICD-10-CM

## 2020-12-03 NOTE — Telephone Encounter (Signed)
Received call from Kathlee Nations [facility] SN with [facility].   Reports that patient has COPD and she has declined in the past year. Reports that she feels she is Palliative Care appropriate. Reports that she did discuss with patient and patient is agreeable. States that referral from their office was placed to [facility].  Received call from [place], [facility] and Palliative care SN. Inquired if PCP will remain primary while patient is on palliative care. Advised that PCP will remain primary.   Requested referral from PCP for palliative care. Orders placed.

## 2020-12-04 ENCOUNTER — Telehealth: Payer: Self-pay | Admitting: *Deleted

## 2020-12-04 NOTE — Telephone Encounter (Signed)
Nothing noted in message. Will close encounter.  

## 2020-12-04 NOTE — Telephone Encounter (Signed)
Received call from patient caregiver, Colletta [place] [phone]~ telephone.   Reports that an incident occurred on 12/03/2020 with patient and [facility] SN in patient bathroom. Reports that they have hating/ cooling vent covered in the bathroom, and vent is near bathroom scale.   Reports that [facility] SN was moving scale and it became hung up on the vent cover. Reports that either the scale, or the vent cover struck patient foot, leaving large discoloration.   States that it does not look like hematoma was formed.   States that she only wanted to notify MD.

## 2020-12-05 ENCOUNTER — Ambulatory Visit: Payer: Medicare Other | Admitting: Family Medicine

## 2020-12-05 NOTE — Telephone Encounter (Signed)
Sent message to pcp regarding incident during in home care visit

## 2020-12-12 ENCOUNTER — Ambulatory Visit: Payer: Medicare Other | Admitting: Internal Medicine

## 2020-12-12 ENCOUNTER — Inpatient Hospital Stay: Admission: RE | Admit: 2020-12-12 | Payer: Medicare Other | Source: Ambulatory Visit

## 2020-12-16 ENCOUNTER — Telehealth: Payer: Self-pay | Admitting: *Deleted

## 2020-12-16 DIAGNOSIS — J449 Chronic obstructive pulmonary disease, unspecified: Secondary | ICD-10-CM

## 2020-12-16 NOTE — Telephone Encounter (Signed)
Received call from patient.   Requested refill on Symbicort.   Noted that patient is currently prescribed Breztri.   Call placed to patient to advise that she does not require Symbicort if currently taking Breztri.   [employer].

## 2020-12-17 MED ORDER — BUDESONIDE-FORMOTEROL FUMARATE 160-4.5 MCG/ACT IN AERO: 2.0000 | INHALATION_SPRAY | Freq: Two times a day (BID) | RESPIRATORY_TRACT | 3 refills | Status: AC

## 2020-12-17 MED ORDER — ALBUTEROL SULFATE HFA 108 (90 BASE) MCG/ACT IN AERS: 2.0000 | INHALATION_SPRAY | RESPIRATORY_TRACT | 3 refills | Status: AC | PRN

## 2020-12-17 NOTE — Telephone Encounter (Signed)
Call placed to patient to inquire.   Reports that Breztri caused severe thrush. Requested to go back to Symbicort.   Prescription sent to pharmacy.

## 2020-12-18 ENCOUNTER — Telehealth: Payer: Self-pay | Admitting: *Deleted

## 2020-12-18 ENCOUNTER — Other Ambulatory Visit: Payer: Self-pay | Admitting: Family Medicine

## 2020-12-18 MED ORDER — AZITHROMYCIN 250 MG PO TABS: ORAL_TABLET | ORAL | 0 refills | Status: AC

## 2020-12-18 NOTE — Telephone Encounter (Signed)
Received call from Vickey Huger with [facility].  Reports that patient has increased wheezing, decreased breath sounds to upper and lower R lobe, chills and SOB. Patient denies fever.   Hospice SN is requesting ABTx.   Please advise.

## 2020-12-18 NOTE — Telephone Encounter (Signed)
Call placed to patient. LMTRC.  

## 2020-12-19 NOTE — Telephone Encounter (Signed)
Patient returned call and made aware.

## 2021-01-01 ENCOUNTER — Telehealth: Payer: Self-pay | Admitting: *Deleted

## 2021-01-01 ENCOUNTER — Telehealth: Payer: Self-pay | Admitting: Internal Medicine

## 2021-01-01 MED ORDER — POTASSIUM CHLORIDE CRYS ER 20 MEQ PO TBCR: 20.0000 meq | EXTENDED_RELEASE_TABLET | Freq: Every day | ORAL | 3 refills | Status: AC

## 2021-01-01 NOTE — Telephone Encounter (Signed)
Nikki calling from [facility], has orders that have needed to be signed since end of may. Insurance deadline is approaching. Needing MR or another provider willing to sign on his behalf.Lexine Baton is faxing papers now. Will place in MR's box.Please advise [phone]

## 2021-01-01 NOTE — Telephone Encounter (Signed)
Called and spoke with Caitlyn. She stated that [facility] had faxed over an OT eval and plan of care for the patient back in May and June 2022 and they have not received the signed forms back yet. The deadline is approaching and they need to have the signed forms faxed back to them or else the patient's insurance will not pay for services. She stated that a NP could sign the forms.   I will look for the forms downstairs.  TP, would you be willing to sign the forms on behalf of Dr. Chase Caller?

## 2021-01-01 NOTE — Telephone Encounter (Signed)
Yes please give me the forms I will sign today .

## 2021-01-01 NOTE — Telephone Encounter (Signed)
Received the forms back from TP. Will fax back to [facility].   Will close encounter.

## 2021-01-01 NOTE — Telephone Encounter (Signed)
Received call from Kathlee Nations [facility] SN with [facility] [phone] telephone.   Reports that during medication review, (2) level 2 interactions were found: Potassium/ Vesicare Potassium/ Hycodan  No interactions noted on up to date.   Call placed to [facility] SN to verify medications and interactions. [place].

## 2021-01-02 ENCOUNTER — Other Ambulatory Visit: Payer: Self-pay | Admitting: Cardiology

## 2021-01-02 NOTE — Telephone Encounter (Signed)
Call placed to [facility] SN and made aware via VM.

## 2021-01-02 NOTE — Telephone Encounter (Signed)
Received return call from Chase, [facility] SN with [facility].   Reports that concurrent use of Potassium/ Vesicare or Potassium/ Hycodan can cause delayed absorption of potassium leading to GI issues.   Concomitant use of agents with anticholinergic properties (e.g., antihistamines, antispasmodics, neuroleptics, phenothiazines, skeletal muscle relaxants, tricyclic antidepressants, the class IA antiarrhythmic disopyramide) may potentiate the risk of upper gastrointestinal injury associated with oral solid formulations of potassium chloride. The proposed mechanism involves increased gastrointestinal transit time due to reduction of stomach and intestinal motility by anticholinergic agents, thereby creating a high localized concentration of potassium ions in the region of a dissolving tablet or capsule and increasing the contact time with GI mucosa. Solid formulations of potassium chloride have been associated with upper GI bleeding and small bowel ulceration, stenosis, perforation, and obstruction. Deaths have been reported rarely.  Please advise.

## 2021-01-02 NOTE — Telephone Encounter (Signed)
Pt last saw Dr Marlou Porch 07/16/20, last labs 08/24/20 Creat 0.81, age 75, weight 48.1kg, based on specified criteria pt is on appropriate dosage of Eliquis '5mg'$  BID.  Will refill rx.

## 2021-01-07 ENCOUNTER — Telehealth: Payer: Self-pay

## 2021-01-08 ENCOUNTER — Other Ambulatory Visit: Payer: Self-pay | Admitting: Family Medicine

## 2021-01-08 ENCOUNTER — Emergency Department (HOSPITAL_BASED_OUTPATIENT_CLINIC_OR_DEPARTMENT_OTHER)
Admission: EM | Admit: 2021-01-08 | Discharge: 2021-01-08 | Disposition: A | Discharge status: Home / Self Care | Payer: Medicare Other | Attending: Emergency Medicine | Admitting: Emergency Medicine

## 2021-01-08 ENCOUNTER — Emergency Department (HOSPITAL_BASED_OUTPATIENT_CLINIC_OR_DEPARTMENT_OTHER): Payer: Medicare Other

## 2021-01-08 ENCOUNTER — Encounter (HOSPITAL_BASED_OUTPATIENT_CLINIC_OR_DEPARTMENT_OTHER): Payer: Self-pay | Admitting: Emergency Medicine

## 2021-01-08 ENCOUNTER — Other Ambulatory Visit: Payer: Self-pay

## 2021-01-08 DIAGNOSIS — Z79899 Other long term (current) drug therapy: Secondary | ICD-10-CM | POA: Insufficient documentation

## 2021-01-08 DIAGNOSIS — E119 Type 2 diabetes mellitus without complications: Secondary | ICD-10-CM | POA: Diagnosis not present

## 2021-01-08 DIAGNOSIS — R1011 Right upper quadrant pain: Secondary | ICD-10-CM | POA: Diagnosis present

## 2021-01-08 DIAGNOSIS — J45909 Unspecified asthma, uncomplicated: Secondary | ICD-10-CM | POA: Diagnosis not present

## 2021-01-08 DIAGNOSIS — K8020 Calculus of gallbladder without cholecystitis without obstruction: Secondary | ICD-10-CM

## 2021-01-08 DIAGNOSIS — J449 Chronic obstructive pulmonary disease, unspecified: Secondary | ICD-10-CM

## 2021-01-08 DIAGNOSIS — J441 Chronic obstructive pulmonary disease with (acute) exacerbation: Secondary | ICD-10-CM | POA: Diagnosis not present

## 2021-01-08 DIAGNOSIS — Z87891 Personal history of nicotine dependence: Secondary | ICD-10-CM | POA: Diagnosis not present

## 2021-01-08 DIAGNOSIS — I10 Essential (primary) hypertension: Secondary | ICD-10-CM | POA: Diagnosis not present

## 2021-01-08 DIAGNOSIS — D72829 Elevated white blood cell count, unspecified: Secondary | ICD-10-CM | POA: Diagnosis not present

## 2021-01-08 DIAGNOSIS — Z7951 Long term (current) use of inhaled steroids: Secondary | ICD-10-CM | POA: Diagnosis not present

## 2021-01-08 DIAGNOSIS — Z85828 Personal history of other malignant neoplasm of skin: Secondary | ICD-10-CM | POA: Insufficient documentation

## 2021-01-08 DIAGNOSIS — I480 Paroxysmal atrial fibrillation: Secondary | ICD-10-CM | POA: Insufficient documentation

## 2021-01-08 LAB — CBC WITH DIFFERENTIAL/PLATELET
Abs Immature Granulocytes: 0.05 10*3/uL (ref 0.00–0.07)
Basophils Absolute: 0.1 10*3/uL (ref 0.0–0.1)
Basophils Relative: 1 %
Eosinophils Absolute: 0.2 10*3/uL (ref 0.0–0.5)
Eosinophils Relative: 1 %
HCT: 40.6 % (ref 36.0–46.0)
Hemoglobin: 12.8 g/dL (ref 12.0–15.0)
Immature Granulocytes: 0 %
Lymphocytes Relative: 13 %
Lymphs Abs: 1.7 10*3/uL (ref 0.7–4.0)
MCH: 27.5 pg (ref 26.0–34.0)
MCHC: 31.5 g/dL (ref 30.0–36.0)
MCV: 87.3 fL (ref 80.0–100.0)
Monocytes Absolute: 0.7 10*3/uL (ref 0.1–1.0)
Monocytes Relative: 5 %
Neutro Abs: 10.7 10*3/uL — ABNORMAL HIGH (ref 1.7–7.7)
Neutrophils Relative %: 80 %
Platelets: 561 10*3/uL — ABNORMAL HIGH (ref 150–400)
RBC: 4.65 MIL/uL (ref 3.87–5.11)
RDW: 16.2 % — ABNORMAL HIGH (ref 11.5–15.5)
WBC: 13.4 10*3/uL — ABNORMAL HIGH (ref 4.0–10.5)
nRBC: 0 % (ref 0.0–0.2)

## 2021-01-08 LAB — COMPREHENSIVE METABOLIC PANEL
ALT: 14 U/L (ref 0–44)
AST: 13 U/L — ABNORMAL LOW (ref 15–41)
Albumin: 4 g/dL (ref 3.5–5.0)
Alkaline Phosphatase: 86 U/L (ref 38–126)
Anion gap: 15 (ref 5–15)
BUN: 27 mg/dL — ABNORMAL HIGH (ref 8–23)
CO2: 21 mmol/L — ABNORMAL LOW (ref 22–32)
Calcium: 9.6 mg/dL (ref 8.9–10.3)
Chloride: 100 mmol/L (ref 98–111)
Creatinine, Ser: 1 mg/dL (ref 0.44–1.00)
GFR, Estimated: 59 mL/min — ABNORMAL LOW (ref >60)
Glucose, Bld: 111 mg/dL — ABNORMAL HIGH (ref 70–99)
Potassium: 3.4 mmol/L — ABNORMAL LOW (ref 3.5–5.1)
Sodium: 136 mmol/L (ref 135–145)
Total Bilirubin: 0.4 mg/dL (ref 0.3–1.2)
Total Protein: 6.7 g/dL (ref 6.5–8.1)

## 2021-01-08 LAB — LACTIC ACID, PLASMA: Lactic Acid, Venous: 1.1 mmol/L (ref 0.5–1.9)

## 2021-01-08 LAB — LIPASE, BLOOD: Lipase: 60 U/L — ABNORMAL HIGH (ref 11–51)

## 2021-01-08 MED ORDER — SULFAMETHOXAZOLE-TRIMETHOPRIM 800-160 MG PO TABS: 1.0000 | ORAL_TABLET | Freq: Two times a day (BID) | ORAL | 0 refills | Status: AC

## 2021-01-08 MED ORDER — FENTANYL CITRATE (PF) 100 MCG/2ML IJ SOLN
25.0000 ug | Freq: Once | INTRAMUSCULAR | Status: AC
  Administered 2021-01-08: 25 ug via INTRAVENOUS
  Filled 2021-01-08: qty 2

## 2021-01-08 NOTE — ED Notes (Signed)
Patients personal O2 tank was empty upon explaining discharge instructions. This nurse talked to patient and to son about the risk of patient leaving without O2, Patient refused to stay while son went to get new tank from home. Patient son made aware and states, "okay we will just get her home".

## 2021-01-08 NOTE — ED Provider Notes (Addendum)
[facility] EMERGENCY DEPT Provider Note   CSN: [id number] Arrival date & time: 01/08/21  1427     History Chief Complaint  Patient presents with   Flank Pain    Diane Patterson is a 75 y.o. female.  Patient with a history of COPD normally on 2 L of oxygen.  Actually came in for complaint of right flank pain right upper quadrant abdominal pain on and off for 2 days.  Currently no pain now.  No nausea no vomiting.  Patient is followed by [facility] family practice.  Patient without any history of kidney stones.  No blood in her urine.  Patient states she has not had a bowel movement for 2 days.      Past Medical History:  Diagnosis Date   Asthma    Chronic airflow obstruction (HCC)    Hypertension    Hypotension    PAF (paroxysmal atrial fibrillation) ([place])    Tobacco abuse    Venous insufficiency     Patient Active Problem List   Diagnosis Date Noted   Coagulation defect ([place]) 11/19/2020   Gait instability 05/27/2020   Paroxysmal A-fib ([place]) 03/03/2020   Diabetes mellitus type 2, uncomplicated ([place]) 20/94/7096   Atrial fibrillation with RVR ([place])    AKI (acute kidney injury) ([place]) 07/11/2017   Chronic respiratory failure with hypoxia ([place]) 11/15/2016   Allergic rhinitis 10/14/2016   Contusion 12/01/2015   Abscess of umbilicus 28/36/6294   COPD exacerbation ([place]) 06/23/2015   Right carotid bruit 08/19/2014   Hyperlipidemia 08/19/2014   Prediabetes 08/19/2014   Bruising 09/26/2013   History of basal cell cancer 09/26/2013   Oral candidiasis 01/10/2013   Essential hypertension 12/21/2011   COPD (chronic obstructive pulmonary disease) ([place]) 01/20/2011   Constipation 06/24/2010   ABDOMINAL BLOATING 06/24/2010   ATHEROSCLEROSIS OF AORTA 02/10/2010   FATTY LIVER DISEASE 02/10/2010   Anxiety state 05/14/2008   Former smoker 12/28/2006   VENOUS INSUFFICIENCY 12/28/2006    Past Surgical History:  Procedure Laterality Date   cataract     NECK  SURGERY     no colonoscopy     "afraid to "; [facility] reviewed   VESICOVAGINAL FISTULA CLOSURE W/ TAH       OB History   No obstetric history on file.     Family History  Problem Relation Age of Onset   Diabetes Mother    Emphysema Father    Asthma Father    Heart disease Father     Social History   Tobacco Use   Smoking status: Former    Packs/day: 0.75    Years: 15.00    Pack years: 11.25    Types: Cigarettes    Quit date: 03/20/2014    Years since quitting: 6.8   Smokeless tobacco: Former    Quit date: 03/11/2014  Vaping Use   Vaping Use: Never used  Substance Use Topics   Alcohol use: No    Alcohol/week: 0.0 standard drinks   Drug use: No    Home Medications Prior to Admission medications   Medication Sig Start Date End Date Taking? Authorizing Provider  albuterol (VENTOLIN HFA) 108 (90 Base) MCG/ACT inhaler Inhale 2 puffs into the lungs every 4 (four) hours as needed for wheezing or shortness of breath. 12/17/20   Susy Frizzle, MD  apixaban (ELIQUIS) 5 MG TABS tablet TAKE 1 TABLET BY MOUTH TWICE A DAY 01/02/21   Jerline Pain, MD  azithromycin (ZITHROMAX) 250 MG tablet 2 tabs poqday1,  1 tab poqday 2-5 12/18/20   Susy Frizzle, MD  bisacodyl 5 MG EC tablet Take 1 tablet (5 mg total) by mouth daily as needed for moderate constipation. 11/20/20   Eulogio Bear, NP  budesonide-formoterol (SYMBICORT) 160-4.5 MCG/ACT inhaler Inhale 2 puffs into the lungs 2 (two) times daily. 12/17/20   Susy Frizzle, MD  diltiazem (CARDIZEM CD) 240 MG 24 hr capsule TAKE 1 CAPSULE BY MOUTH EVERY DAY 04/15/20   Jerline Pain, MD  guaiFENesin (ROBITUSSIN) 100 MG/5ML SOLN Take 5 mLs (100 mg total) by mouth every 4 (four) hours as needed for cough or to loosen phlegm. 11/20/20   Eulogio Bear, NP  hydrochlorothiazide (MICROZIDE) 12.5 MG capsule Take 1 capsule (12.5 mg total) by mouth daily. 04/11/20   Dunn, Nedra Hai, PA-C  nystatin (MYCOSTATIN) 100000 UNIT/ML suspension Take 5 mLs  (500,000 Units total) by mouth 4 (four) times daily. 12/02/20   Susy Frizzle, MD  potassium chloride SA (KLOR-CON) 20 MEQ tablet Take 1 tablet (20 mEq total) by mouth daily. 01/01/21   Susy Frizzle, MD  predniSONE (DELTASONE) 20 MG tablet Take 2 tablets (40 mg total) by mouth daily with breakfast. 11/27/20   Susy Frizzle, MD  solifenacin (VESICARE) 10 MG tablet Take 1 tablet (10 mg total) by mouth daily. 09/11/20   Susy Frizzle, MD  sulfamethoxazole-trimethoprim (BACTRIM DS) 800-160 MG tablet Take 1 tablet by mouth 2 (two) times daily. 01/08/21   Susy Frizzle, MD    Allergies    Patient has no known allergies.  Review of Systems   Review of Systems  Constitutional:  Negative for chills and fever.  HENT:  Negative for ear pain and sore throat.   Eyes:  Negative for pain and visual disturbance.  Respiratory:  Positive for shortness of breath. Negative for cough.   Cardiovascular:  Negative for chest pain and palpitations.  Gastrointestinal:  Positive for abdominal pain. Negative for vomiting.  Genitourinary:  Positive for flank pain. Negative for dysuria and hematuria.  Musculoskeletal:  Negative for arthralgias and back pain.  Skin:  Negative for color change and rash.  Neurological:  Negative for seizures and syncope.  All other systems reviewed and are negative.  Physical Exam Updated Vital Signs BP (!) 150/56   Pulse 98   Temp 98.4 F (36.9 C)   Resp 20   Ht 1.702 m (_0 )   Wt 56.7 kg   LMP  (LMP Unknown)   SpO2 100%   BMI 19.58 kg/m   Physical Exam Vitals and nursing note reviewed.  Constitutional:      General: She is not in acute distress.    Appearance: Normal appearance. She is well-developed.  HENT:     Head: Normocephalic and atraumatic.  Eyes:     Conjunctiva/sclera: Conjunctivae normal.  Cardiovascular:     Rate and Rhythm: Normal rate and regular rhythm.     Heart sounds: No murmur heard. Pulmonary:     Effort: Pulmonary effort is  normal. No respiratory distress.     Breath sounds: Normal breath sounds.  Abdominal:     General: There is no distension.     Palpations: Abdomen is soft.     Tenderness: There is no abdominal tenderness. There is no guarding.  Musculoskeletal:        General: No swelling. Normal range of motion.     Cervical back: Neck supple.  Skin:    General: Skin is warm and dry.  Neurological:     General: No focal deficit present.     Mental Status: She is alert and oriented to person, place, and time.     Sensory: No sensory deficit.    ED Results / Procedures / Treatments   Labs (all labs ordered are listed, but only abnormal results are displayed) Labs Reviewed  COMPREHENSIVE METABOLIC PANEL - Abnormal; Notable for the following components:      Result Value   Potassium 3.4 (*)    CO2 21 (*)    Glucose, Bld 111 (*)    BUN 27 (*)    AST 13 (*)    GFR, Estimated 59 (*)    All other components within normal limits  LIPASE, BLOOD - Abnormal; Notable for the following components:   Lipase 60 (*)    All other components within normal limits  CBC WITH DIFFERENTIAL/PLATELET - Abnormal; Notable for the following components:   WBC 13.4 (*)    RDW 16.2 (*)    Platelets 561 (*)    Neutro Abs 10.7 (*)    All other components within normal limits  LACTIC ACID, PLASMA  URINALYSIS, ROUTINE W REFLEX MICROSCOPIC  LACTIC ACID, PLASMA    EKG EKG Interpretation  Date/Time:  Thursday January 08 2021 14:47:37 EDT Ventricular Rate:  128 PR Interval:  132 QRS Duration: 90 QT Interval:  320 QTC Calculation: 467 R Axis:   -82 Text Interpretation: Sinus tachycardia Right atrial enlargement Left anterior fascicular block Cannot rule out Inferior infarct (masked by fascicular block?) , age undetermined Anterior infarct , age undetermined Abnormal ECG Confirmed by Fredia Sorrow [phone]) on 01/08/2021 7:32:54 PM  Radiology CT Renal Stone Study  Result Date: 01/08/2021 CLINICAL DATA:  75 year old  female with flank pain. Concern for kidney stone. EXAM: CT ABDOMEN AND PELVIS WITHOUT CONTRAST TECHNIQUE: Multidetector CT imaging of the abdomen and pelvis was performed following the standard protocol without IV contrast. COMPARISON:  CT abdomen pelvis dated 03/10/2018. FINDINGS: Evaluation of this exam is limited in the absence of intravenous contrast. Lower chest: Emphysema. The visualized lung bases are otherwise clear. No intra-abdominal free air or free fluid. Hepatobiliary: The liver is unremarkable. No intrahepatic biliary dilatation. There is a stone within the gallbladder. No pericholecystic fluid or evidence of acute cholecystitis by CT. Pancreas: Unremarkable. No pancreatic ductal dilatation or surrounding inflammatory changes. Spleen: Normal in size without focal abnormality. Adrenals/Urinary Tract: The adrenal glands are unremarkable. Probable small vascular calcifications versus less likely nonobstructing bilateral renal calculi measure up to 3 mm in the interpolar right kidney. There is no hydronephrosis on either side. Small exophytic bilateral renal lesions measure up to 1 cm in the superior pole of the left kidney are not characterized on this noncontrast CT. Further evaluation with ultrasound on a nonemergent/outpatient basis recommended. The visualized ureters appear unremarkable. The urinary bladder is collapsed. Stomach/Bowel: There is no bowel obstruction or active inflammation. The appendix is normal. Vascular/Lymphatic: There is advanced aortoiliac atherosclerotic disease. There is a 4.5 cm fusiform infrarenal abdominal aortic aneurysm (previously measuring 4 cm). Evaluation of the aorta is limited in the absence of intravenous contrast. The IVC is unremarkable. No portal venous gas. There is no adenopathy. Reproductive: Hysterectomy. No adnexal masses. Other: None Musculoskeletal: Osteopenia with degenerative changes of the spine. Old appearing L4 compression fracture. No acute osseous  pathology. IMPRESSION: 1. Increase in the size of the infrarenal abdominal aortic aneurysm, now measures 4.5 cm in diameter (previously 4 cm). Recommend follow-up CT/MR every 6 months and  vascular consultation. This recommendation follows ACR consensus guidelines: White Paper of the ACR Incidental Findings Committee II on Vascular Findings. J Am Coll Radiol 2013; 10:789-794. 2. No bowel obstruction. Normal appendix. 3. Cholelithiasis. 4. Aortic Atherosclerosis (ICD10-I70.0). Electronically Signed   By: Anner Crete M.D.   On: 01/08/2021 15:50    Procedures Procedures   Medications Ordered in ED Medications  fentaNYL (SUBLIMAZE) injection 25 mcg (25 mcg Intravenous Given 01/08/21 1515)    ED Course  I have reviewed the triage vital signs and the nursing notes.  Pertinent labs & imaging results that were available during my care of the patient were reviewed by me and considered in my medical decision making (see chart for details).    MDM Rules/Calculators/A&P                           Patient now asymptomatic.  CT scan does not show any kidney stones.  There is evidence of gallbladder stone.  But no evidence of any gallbladder infection.  Patient does have a mild leukocytosis and lipase is elevated some.  But bilirubin and alk phos are normal.  Urinalysis not collected.  Patient wants to go home she does not want a wait for that.  We will follow-up with her primary care doctor to have her labs rechecked.  Will give referral to general surgery if possible for symptomatic gallstones.  Patient will return for any new or worse symptoms.  Sternal findings of slight increase in abdominal aneurysm size.  They are recommending follow-up of that in 6 months.  Information passed on to the patient.   Final Clinical Impression(s) / ED Diagnoses Final diagnoses:  Right upper quadrant abdominal pain  Calculus of gallbladder without cholecystitis without obstruction  Chronic obstructive pulmonary  disease, unspecified COPD type [facility])    Rx / DC Orders ED Discharge Orders     None        Fredia Sorrow, MD 01/08/21 2022    Fredia Sorrow, MD 01/08/21 2030

## 2021-01-08 NOTE — Discharge Instructions (Addendum)
CT scan shows evidence of cholelithiasis without evidence of acute cholecystitis.  Symptoms could be related to this.  Lipase slightly elevated.  Make an appointment to follow-up with your primary care doctor to have labs rechecked.  We will also give referral to general surgery.  Return for recurrent pain lasting 3 hours or longer.  Or for any new or worse symptoms.

## 2021-01-08 NOTE — ED Provider Notes (Signed)
Emergency Medicine Provider Triage Evaluation Note  Diane Patterson , a 75 y.o. female  was evaluated in triage.  Pt complains of pain in her right flank area radiating slightly into the abdomen.  Pain is sharp in quality.  She reports occasionally having had similar pain.  She reports she is had similar pain like this when she has been constipated.  She does not think she has had a regular bowel movement.  Patient is chronically short of breath on oxygen.  Review of Systems  Positive: Flank pain Negative: Burning or urgency with urination  Physical Exam  BP 138/85 (BP Location: Right Arm)   Pulse (!) 130   Temp 98.4 F (36.9 C)   Resp (!) 22   Ht '5\' 7"'$  (1.702 m)   Wt 56.7 kg   LMP  (LMP Unknown)   SpO2 96%   BMI 19.58 kg/m  Gen:   Awake, patient is very uncomfortable in appearance. Resp:  On oxygen.  Mild increased work of breathing.  Lung sounds are soft without gross wheeze or rhonchi. MSK:               extremities very thin.  No gross peripheral edema.  Superficial abrasions   Medical Decision Making  Medically screening exam initiated at 2:56 PM.  Appropriate orders placed.  Diane Patterson was informed that the remainder of the evaluation will be completed by another provider, this initial triage assessment does not replace that evaluation, and the importance of remaining in the ED until their evaluation is complete.  Patient has flank pain.  Stable blood pressure.  Tachycardic.  No guarding in the abdomen.  We will proceed with CT and pain control.   Diane Shanks, MD 01/08/21 [phone]

## 2021-01-08 NOTE — ED Triage Notes (Signed)
Pt via pov from home with right flank pain x 2 days. Pt denies any urinary symptoms, states she has not had a bowel movement in 2 days. Pt uses O2 concentrator chronically for copd, on 2L via [place]. Pt moaning during triage. Pt alert & oriented x 4.

## 2021-01-09 NOTE — Telephone Encounter (Signed)
Spoke with pt home health nurse, PCP has sent antibx to pt pharmacy for possible uti and also gave recommendations for constipation relief

## 2021-01-14 ENCOUNTER — Telehealth: Payer: Self-pay | Admitting: *Deleted

## 2021-01-14 NOTE — Telephone Encounter (Signed)
Received call from patient son, Legrand Como [phone]~ telephone.   Reports that patient was seen in ER for ABD pain and noted to have gall stones. Patient has F/U appointment with PCP.   States that he feels patient is showing memory loss. Reports that she forgot why she was at ER, looses train of thought frequently, and has difficulty recalling recent conversations.  Requested PCP to evaluate at F/U.

## 2021-01-15 ENCOUNTER — Telehealth: Payer: Self-pay | Admitting: *Deleted

## 2021-01-15 NOTE — Telephone Encounter (Signed)
Received call from Joelene Millin, NP with [facility] Palliative [facility].  Reports that patient seen today for palliative care and noted to have elevated blood pressure and HR: 156/102, HR 130  Reports that patient did not take Diltiazem this morning, but did take it during visit with NP. BP/HR did return to baseline after medications.   Also reports that patient is voicing C/O urinary frequency and urgency. Advised that patient has appointment on 01/16/2021. Advised that UA C&S can be performed at visit.

## 2021-01-16 ENCOUNTER — Ambulatory Visit: Payer: Medicare Other | Admitting: Family Medicine

## 2021-01-20 ENCOUNTER — Other Ambulatory Visit: Payer: Self-pay

## 2021-01-20 ENCOUNTER — Telehealth: Payer: Self-pay

## 2021-01-20 ENCOUNTER — Telehealth: Payer: Self-pay | Admitting: Internal Medicine

## 2021-01-20 DIAGNOSIS — I10 Essential (primary) hypertension: Secondary | ICD-10-CM

## 2021-01-20 MED ORDER — FUROSEMIDE 40 MG PO TABS: 40.0000 mg | ORAL_TABLET | Freq: Every day | ORAL | 0 refills | Status: AC

## 2021-01-20 NOTE — Telephone Encounter (Signed)
Called and spoke with Kathlee Nations who is the patients home health nurse and she states that she has been at the patient's house for about 45 minutes now and her respirations are 28, she is wheezing throughout and having labored breathing. She states that she is using her rescue inhaler and Symbicort. Kathlee Nations in wondering if it would be possible to get patient a nebulizer machine.  Dr. Chase Caller please advise

## 2021-01-20 NOTE — Telephone Encounter (Signed)
  She is an oxygen dependent COPD patient.  She is advanced COPD patient.  What they are describing sounds she needs to go to the emergency department.  Certainly we can give her nebulizer machine.  No flowsheet data found.

## 2021-01-20 NOTE — Telephone Encounter (Signed)
Called Kathlee Nations but she did not answer. I have pended the order for the nebulizer. Will need to make sure the order needs to go to [facility].

## 2021-01-20 NOTE — Telephone Encounter (Signed)
Message addressed.

## 2021-01-20 NOTE — Telephone Encounter (Signed)
Spoke with Kathlee Nations home health nurse to give orders from pcp. Hold hctz for now start lasix '40mg'$ , sent to pharmacy. Ok to change unna boot within 3-4 days. Pt has appt for Monday for follow up.

## 2021-01-20 NOTE — Telephone Encounter (Signed)
Diane Patterson from [facility] called in stating that she is with pt right now and pt's legs are weeping. She would like to know if she could get some UNA boots for pt. Diane Patterson also stated that pt is showing labored breathing. Please advise, please call back asap.  Cb#:[phone]

## 2021-01-21 MED ORDER — IPRATROPIUM-ALBUTEROL 0.5-2.5 (3) MG/3ML IN SOLN: 3.0000 mL | Freq: Three times a day (TID) | RESPIRATORY_TRACT | 5 refills | Status: DC

## 2021-01-21 NOTE — Addendum Note (Signed)
Addended by: Vanessa Barbara on: 01/21/2021 02:40 PM   Modules accepted: Orders

## 2021-01-21 NOTE — Telephone Encounter (Signed)
Kathlee Nations home health nurse is returning phone call. Kathlee Nations phone number is [phone].

## 2021-01-21 NOTE — Telephone Encounter (Signed)
Called and spoke with Diane Patterson, she states that the patient needs the nebulizer machine and duoneb order sent to [facility].  Advised I would send over the orders.  Diane Patterson states the patient's COPD seems to be getting worse and either she is having memory problems or she is retaining CO2.  She had 2 hours of labored breathing while Diane Patterson, the nurse was at her home, after that her breathing got easier.  She is on 2L of oxygen sating in the high 90's.  She wanted to know about getting her into the office for an evaluation.  She was last seen by MR on 11/06/20.  Diane Patterson stated she would have the patient's spouse call to schedule an appointment.  I called both numbers listed for the patient, the home # vm was full and I left a vm on the cell # for them to return the call to schedule her an OV.  Will leave open until she gets a f/u OV in person scheduled.  Patient will need first available appointment.  MR does not have any openings in the next week.  Eustaquio Maize has an opening on 8/12 at 9:30 am.  Dr. Chase Caller, Do you want anything else done other than getting her in for an office visit?  Please advise.  Thank you.

## 2021-01-22 NOTE — Telephone Encounter (Signed)
Okay to see Diane Patterson on 01/23/2021.  Please have a blood gas done on her current oxygen settings prior to the visit

## 2021-01-22 NOTE — Telephone Encounter (Signed)
01/23/21 OV is not available.  Next OV opening with NP is 02/09/21 at 1530 and 1600.  Is this ok Dr. Chase Caller?

## 2021-01-22 NOTE — Telephone Encounter (Signed)
Called and spoke with patient's husband. He states that Hospice took over today.  FYI

## 2021-01-22 NOTE — Telephone Encounter (Signed)
Yes 02/09/21 vist is fine. Any worseneing in interim go to ER

## 2021-01-23 ENCOUNTER — Telehealth: Payer: Self-pay | Admitting: *Deleted

## 2021-01-23 ENCOUNTER — Other Ambulatory Visit: Payer: Self-pay | Admitting: Family Medicine

## 2021-01-23 ENCOUNTER — Telehealth: Payer: Self-pay

## 2021-01-23 MED ORDER — LORAZEPAM 2 MG/ML PO CONC: 1.0000 mg | ORAL | 0 refills | Status: AC | PRN

## 2021-01-23 MED ORDER — MORPHINE SULFATE (CONCENTRATE) 20 MG/ML PO SOLN: 1.0000 mg | ORAL | 0 refills | Status: DC | PRN

## 2021-01-23 MED ORDER — MORPHINE SULFATE (CONCENTRATE) 20 MG/ML PO SOLN: 5.0000 mg | ORAL | 0 refills | Status: AC | PRN

## 2021-01-23 MED ORDER — IPRATROPIUM-ALBUTEROL 0.5-2.5 (3) MG/3ML IN SOLN: 3.0000 mL | RESPIRATORY_TRACT | 3 refills | Status: AC | PRN

## 2021-01-23 NOTE — Telephone Encounter (Signed)
Call placed to Diane Patterson made aware.   Morphine was incorrectly set up for 0.51m ('1mg'$ ) instead of 0.240m('5mg'$ ).   Please re-send.

## 2021-01-23 NOTE — Telephone Encounter (Signed)
Pharmacy called to for correct sig on Morphine for pt after receiving 2 rx.   sig read to pharmacist as pcp wrote  Morphine 0.4m ('5mg'$ )

## 2021-01-23 NOTE — Addendum Note (Signed)
Addended by: Jenna Luo T on: 01/23/2021 01:16 PM   Modules accepted: Orders

## 2021-01-23 NOTE — Addendum Note (Signed)
Addended by: Sheral Flow on: 01/23/2021 11:30 AM   Modules accepted: Orders

## 2021-01-23 NOTE — Telephone Encounter (Signed)
Oh wow. Ok they should still keep my appt later in month and if unable to make it can cancel

## 2021-01-23 NOTE — Telephone Encounter (Signed)
Received call from Beth, [facility] [phone]~ telephone.   Reports that patient has been admitted to services with Dx: COPD.   Reports that edema in BLE has improved.   Also states that patient is having increased labored breathing. States that HE is elevated, put patient is using inhaler approximately every 10 minutes.   Requested following orders to be sent to [facility]: Morphine Sulfate '20mg'$ / mL- 0.64m PO q2hrs PRN- pain/ SOB Ativan '2mg'$ / mL- 0.285mPO q3hrs PRN- anxiety Duoneb Tx- 1 vial inhaled q 4hrs PRN- SOB

## 2021-01-26 ENCOUNTER — Ambulatory Visit: Payer: Medicare Other | Admitting: Family Medicine

## 2021-01-28 ENCOUNTER — Ambulatory Visit: Payer: Medicare Other | Admitting: Podiatry

## 2021-02-12 DEATH — deceased

## 2022-03-07 IMAGING — DX DG CHEST 1V PORT
1 series · 1 of 1 positions shown · non-contrast
Comparison: 04/04/2019

CLINICAL DATA: Shortness of breath

EXAM:
PORTABLE CHEST 1 VIEW

[chest]
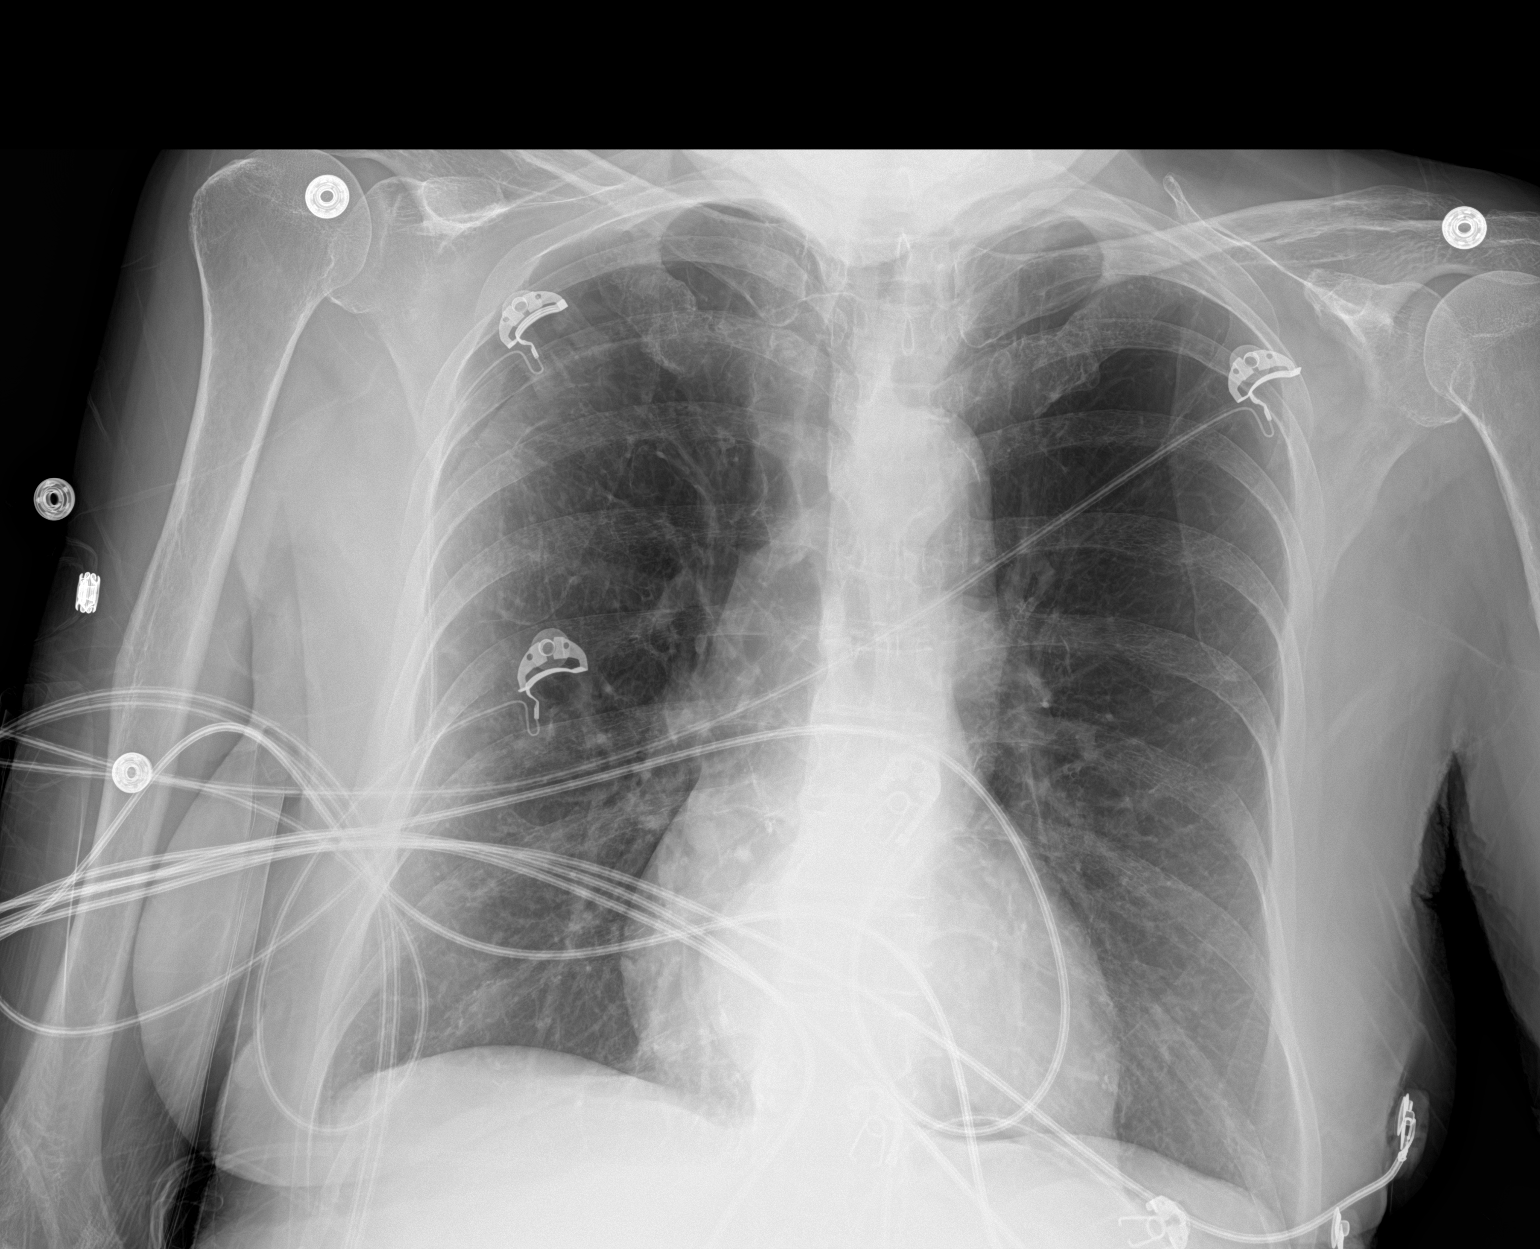

[1 of 1 positions shown; findings below may reference images not displayed]

FINDINGS: Cardiac shadow is stable. Aortic calcifications are again seen. The
lungs are hyperinflated but clear. No acute bony abnormality is
seen.
IMPRESSION: COPD without acute abnormality.

## 2022-07-04 IMAGING — DX DG CHEST 2V
2 series · 2 of 2 positions shown · non-contrast
Comparison: Chest x-ray dated November 05, 2019.

CLINICAL DATA: Chest pain.

EXAM:
CHEST - 2 VIEW

[chest pa]
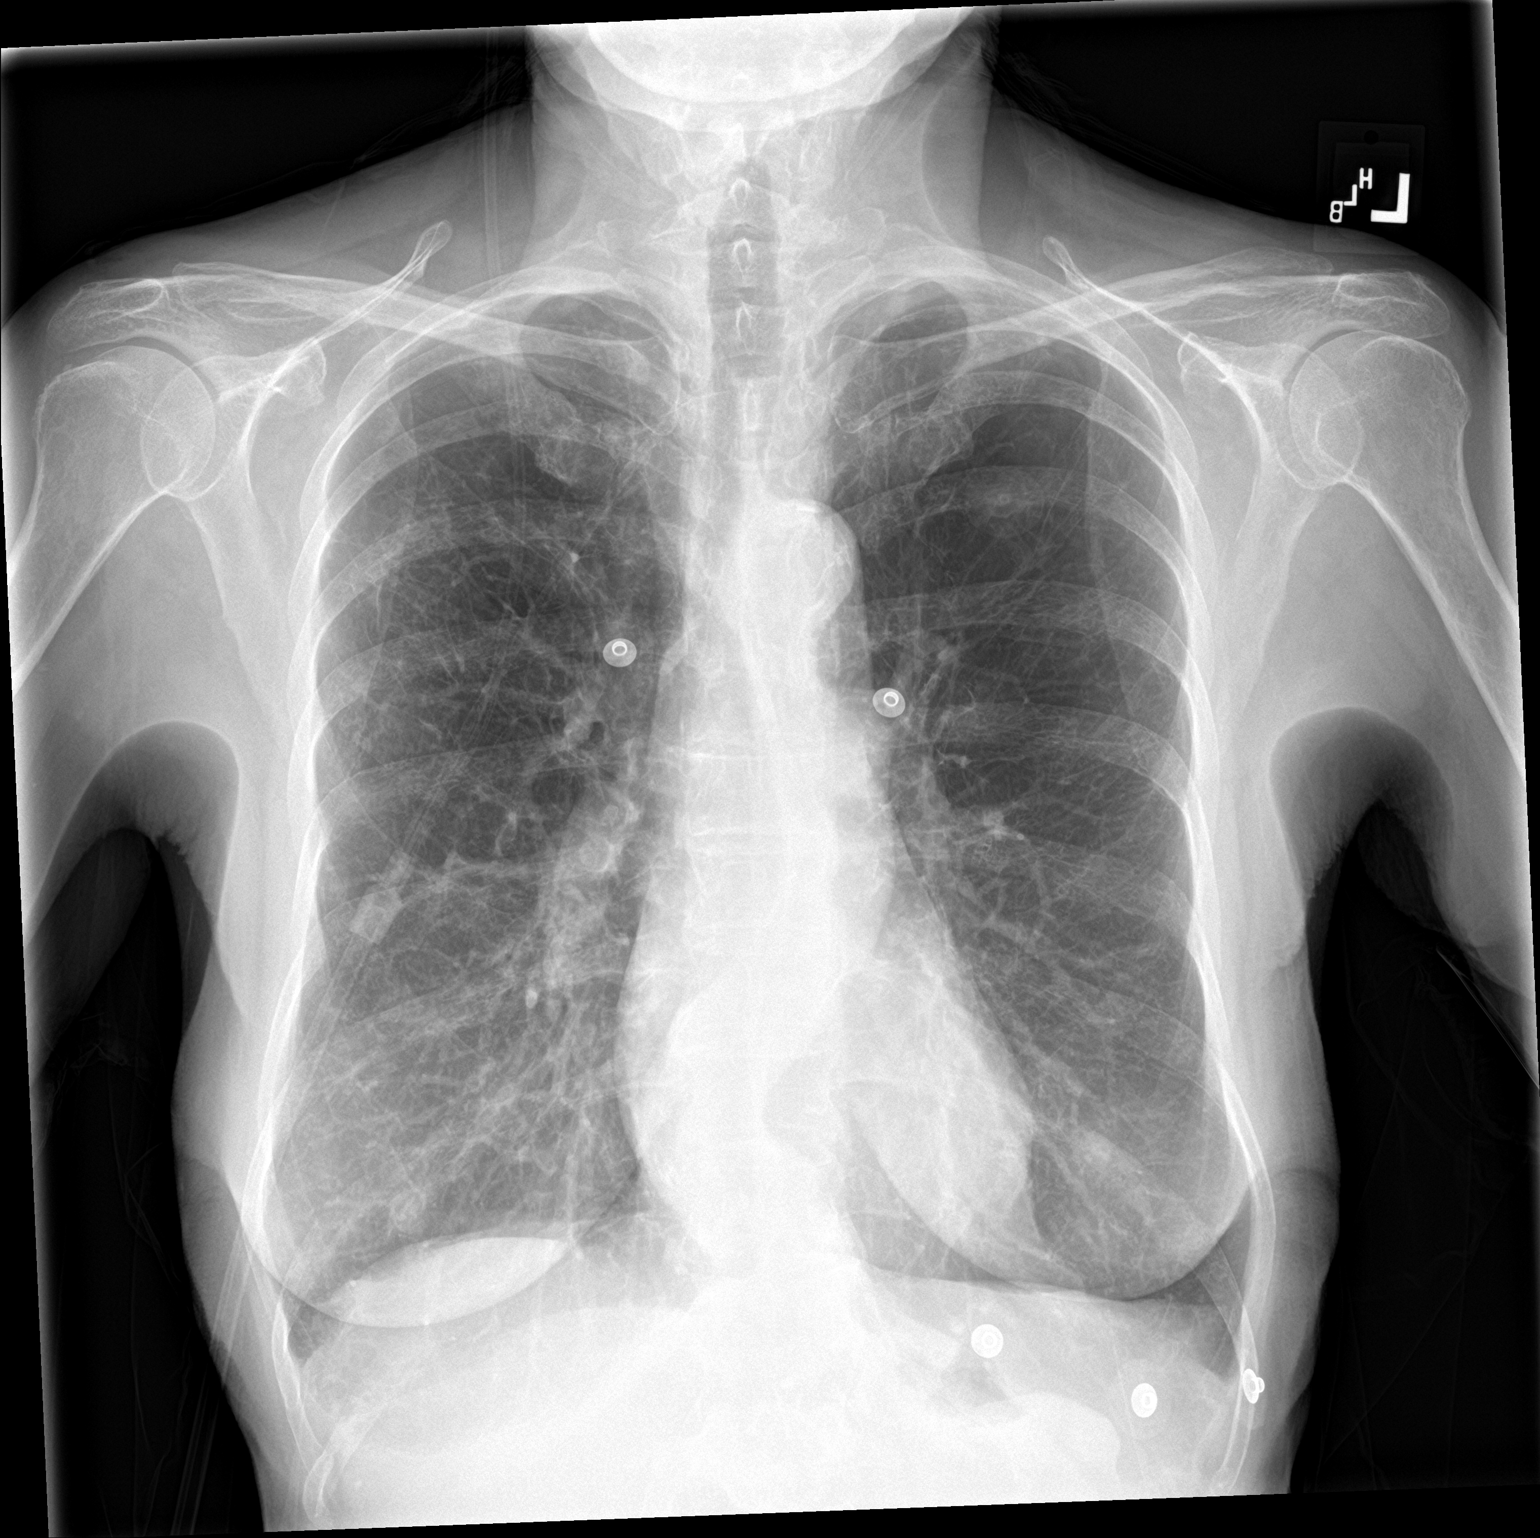

[chest lat]
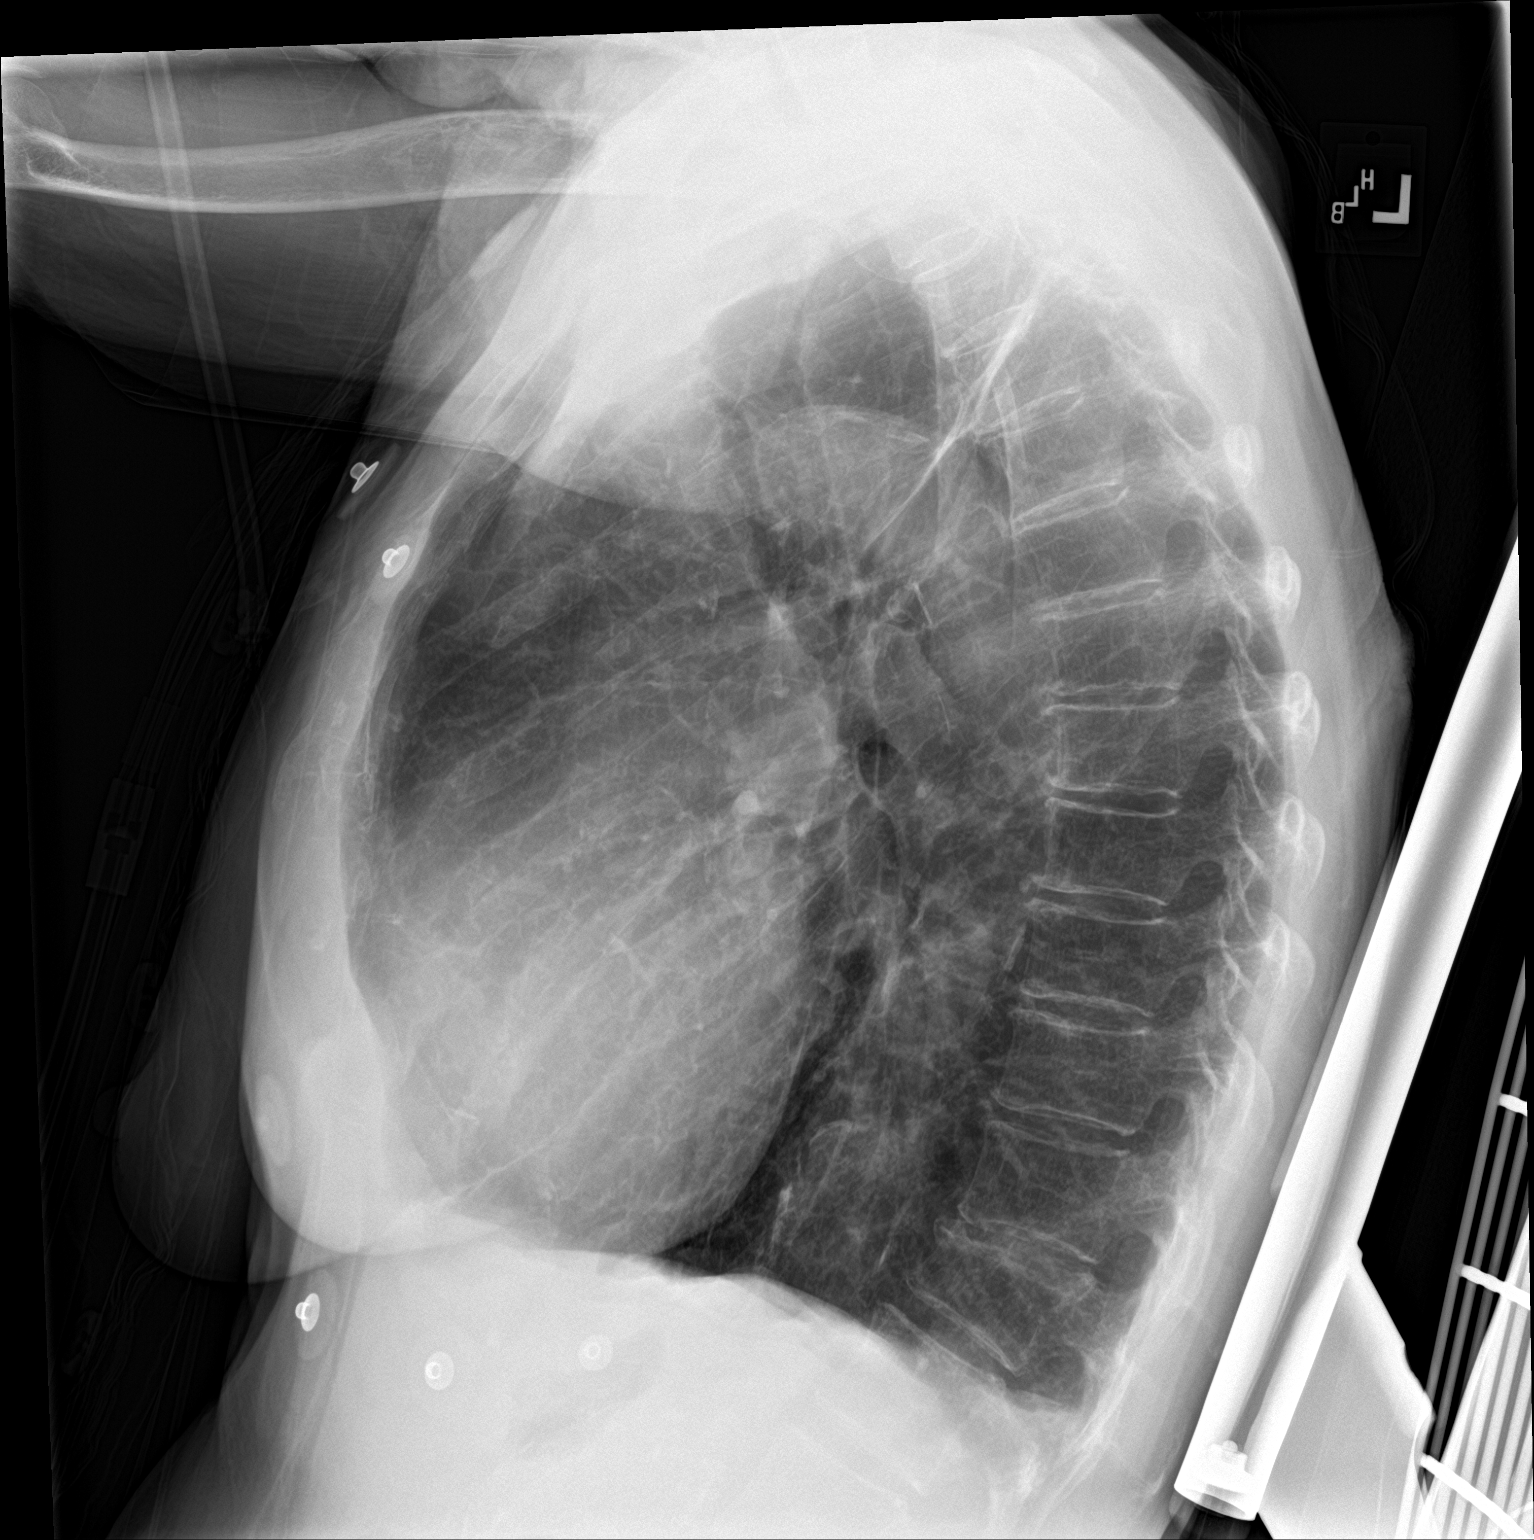

[2 of 2 positions shown; findings below may reference images not displayed]

FINDINGS: The heart size and mediastinal contours are within normal limits.
Atherosclerotic calcification of the aortic arch. Normal pulmonary
vascularity. The lungs remain hyperinflated with emphysematous
changes. No focal consolidation, pleural effusion, or pneumothorax.
No acute osseous abnormality. Chronic T12 compression deformity.
IMPRESSION: 1. No acute cardiopulmonary disease.
2. COPD.
